# Patient Record
Sex: Female | Born: 1945 | Race: Black or African American | Hispanic: No | Marital: Single | Smoking: Never smoker
Health system: Southern US, Community
[De-identification: ages and names within clinical notes are randomized; demographics above are authoritative.]

## PROBLEM LIST (undated history)

## (undated) DIAGNOSIS — M1990 Unspecified osteoarthritis, unspecified site: Secondary | ICD-10-CM

## (undated) DIAGNOSIS — Z87442 Personal history of urinary calculi: Secondary | ICD-10-CM

## (undated) DIAGNOSIS — K5792 Diverticulitis of intestine, part unspecified, without perforation or abscess without bleeding: Secondary | ICD-10-CM

## (undated) DIAGNOSIS — R7303 Prediabetes: Secondary | ICD-10-CM

## (undated) DIAGNOSIS — E785 Hyperlipidemia, unspecified: Secondary | ICD-10-CM

## (undated) DIAGNOSIS — D699 Hemorrhagic condition, unspecified: Secondary | ICD-10-CM

## (undated) DIAGNOSIS — Z8719 Personal history of other diseases of the digestive system: Secondary | ICD-10-CM

## (undated) DIAGNOSIS — K219 Gastro-esophageal reflux disease without esophagitis: Secondary | ICD-10-CM

## (undated) DIAGNOSIS — I700 Atherosclerosis of aorta: Secondary | ICD-10-CM

## (undated) DIAGNOSIS — Z8711 Personal history of peptic ulcer disease: Secondary | ICD-10-CM

## (undated) DIAGNOSIS — I10 Essential (primary) hypertension: Secondary | ICD-10-CM

## (undated) DIAGNOSIS — M4316 Spondylolisthesis, lumbar region: Secondary | ICD-10-CM

## (undated) DIAGNOSIS — M810 Age-related osteoporosis without current pathological fracture: Secondary | ICD-10-CM

## (undated) HISTORY — PX: FOOT SURGERY: SHX648

## (undated) HISTORY — DX: Essential (primary) hypertension: I10

## (undated) HISTORY — DX: Personal history of other diseases of the digestive system: Z87.19

## (undated) HISTORY — DX: Unspecified osteoarthritis, unspecified site: M19.90

## (undated) HISTORY — DX: Hemorrhagic condition, unspecified: D69.9

## (undated) HISTORY — DX: Gastro-esophageal reflux disease without esophagitis: K21.9

## (undated) HISTORY — DX: Spondylolisthesis, lumbar region: M43.16

## (undated) HISTORY — DX: Personal history of peptic ulcer disease: Z87.11

## (undated) HISTORY — DX: Prediabetes: R73.03

## (undated) HISTORY — DX: Atherosclerosis of aorta: I70.0

## (undated) HISTORY — PX: ABDOMINAL HYSTERECTOMY: SHX81

## (undated) HISTORY — DX: Age-related osteoporosis without current pathological fracture: M81.0

## (undated) HISTORY — PX: BREAST CYST ASPIRATION: SHX578

## (undated) HISTORY — DX: Hyperlipidemia, unspecified: E78.5

---

## 2005-02-12 ENCOUNTER — Ambulatory Visit: Payer: Self-pay | Admitting: Family Medicine

## 2006-03-10 ENCOUNTER — Ambulatory Visit: Payer: Self-pay | Admitting: Family Medicine

## 2007-03-12 ENCOUNTER — Ambulatory Visit: Payer: Self-pay

## 2007-03-19 ENCOUNTER — Ambulatory Visit: Payer: Self-pay | Admitting: Family Medicine

## 2008-03-22 ENCOUNTER — Ambulatory Visit: Payer: Self-pay | Admitting: Family Medicine

## 2008-05-11 ENCOUNTER — Inpatient Hospital Stay: Payer: Self-pay | Admitting: Internal Medicine

## 2008-05-14 ENCOUNTER — Other Ambulatory Visit: Payer: Self-pay

## 2008-09-30 ENCOUNTER — Ambulatory Visit: Payer: Self-pay | Admitting: Unknown Physician Specialty

## 2008-11-25 HISTORY — PX: BACK SURGERY: SHX140

## 2009-03-28 ENCOUNTER — Ambulatory Visit: Payer: Self-pay | Admitting: Family Medicine

## 2009-08-18 ENCOUNTER — Ambulatory Visit: Payer: Self-pay | Admitting: Family Medicine

## 2009-09-06 ENCOUNTER — Ambulatory Visit: Payer: Self-pay | Admitting: Family Medicine

## 2009-09-29 ENCOUNTER — Ambulatory Visit (HOSPITAL_COMMUNITY): Admission: RE | Admit: 2009-09-29 | Discharge: 2009-09-30 | Payer: Self-pay | Admitting: Neurosurgery

## 2010-03-29 ENCOUNTER — Ambulatory Visit: Payer: Self-pay | Admitting: Family Medicine

## 2010-09-24 ENCOUNTER — Ambulatory Visit: Payer: Self-pay | Admitting: Family Medicine

## 2011-01-30 ENCOUNTER — Ambulatory Visit: Payer: Self-pay | Admitting: Physical Medicine and Rehabilitation

## 2011-02-27 LAB — URINALYSIS, ROUTINE W REFLEX MICROSCOPIC
Bilirubin Urine: NEGATIVE
Glucose, UA: NEGATIVE mg/dL
Hgb urine dipstick: NEGATIVE
Ketones, ur: NEGATIVE mg/dL
Nitrite: NEGATIVE
Protein, ur: NEGATIVE mg/dL
Specific Gravity, Urine: 1.006 (ref 1.005–1.030)
Urobilinogen, UA: 0.2 mg/dL (ref 0.0–1.0)
pH: 7.5 (ref 5.0–8.0)

## 2011-02-27 LAB — BASIC METABOLIC PANEL
BUN: 10 mg/dL (ref 6–23)
CO2: 26 mEq/L (ref 19–32)
Calcium: 8.8 mg/dL (ref 8.4–10.5)
Chloride: 105 mEq/L (ref 96–112)
Creatinine, Ser: 0.63 mg/dL (ref 0.4–1.2)
GFR calc Af Amer: >60 mL/min (ref >60)
GFR calc non Af Amer: >60 mL/min (ref >60)
Glucose, Bld: 93 mg/dL (ref 70–99)
Potassium: 4 mEq/L (ref 3.5–5.1)
Sodium: 140 mEq/L (ref 135–145)

## 2011-02-27 LAB — CBC
HCT: 39.3 % (ref 36.0–46.0)
Hemoglobin: 13.4 g/dL (ref 12.0–15.0)
MCHC: 34 g/dL (ref 30.0–36.0)
MCV: 90.8 fL (ref 78.0–100.0)
Platelets: 230 10*3/uL (ref 150–400)
RBC: 4.32 MIL/uL (ref 3.87–5.11)
RDW: 13.3 % (ref 11.5–15.5)
WBC: 5.6 10*3/uL (ref 4.0–10.5)

## 2011-04-02 ENCOUNTER — Ambulatory Visit: Payer: Self-pay | Admitting: Family Medicine

## 2011-07-22 ENCOUNTER — Ambulatory Visit: Payer: Self-pay | Admitting: Unknown Physician Specialty

## 2011-11-15 ENCOUNTER — Ambulatory Visit: Payer: Self-pay | Admitting: Unknown Physician Specialty

## 2011-11-18 LAB — PATHOLOGY REPORT

## 2012-01-01 ENCOUNTER — Ambulatory Visit: Payer: Self-pay | Admitting: Unknown Physician Specialty

## 2012-04-02 ENCOUNTER — Ambulatory Visit: Payer: Self-pay | Admitting: Family Medicine

## 2012-10-19 DIAGNOSIS — E7800 Pure hypercholesterolemia, unspecified: Secondary | ICD-10-CM | POA: Diagnosis not present

## 2012-10-19 DIAGNOSIS — I10 Essential (primary) hypertension: Secondary | ICD-10-CM | POA: Diagnosis not present

## 2013-02-18 DIAGNOSIS — M25569 Pain in unspecified knee: Secondary | ICD-10-CM | POA: Diagnosis not present

## 2013-02-18 DIAGNOSIS — I10 Essential (primary) hypertension: Secondary | ICD-10-CM | POA: Diagnosis not present

## 2013-02-18 DIAGNOSIS — E7800 Pure hypercholesterolemia, unspecified: Secondary | ICD-10-CM | POA: Diagnosis not present

## 2013-03-03 ENCOUNTER — Ambulatory Visit: Payer: Self-pay | Admitting: Family Medicine

## 2013-03-03 DIAGNOSIS — M810 Age-related osteoporosis without current pathological fracture: Secondary | ICD-10-CM | POA: Diagnosis not present

## 2013-03-03 DIAGNOSIS — E2839 Other primary ovarian failure: Secondary | ICD-10-CM | POA: Diagnosis not present

## 2013-05-20 DIAGNOSIS — M25579 Pain in unspecified ankle and joints of unspecified foot: Secondary | ICD-10-CM | POA: Diagnosis not present

## 2013-06-02 DIAGNOSIS — M2010 Hallux valgus (acquired), unspecified foot: Secondary | ICD-10-CM | POA: Diagnosis not present

## 2013-06-02 DIAGNOSIS — M109 Gout, unspecified: Secondary | ICD-10-CM | POA: Diagnosis not present

## 2013-06-02 DIAGNOSIS — M25579 Pain in unspecified ankle and joints of unspecified foot: Secondary | ICD-10-CM | POA: Diagnosis not present

## 2013-06-16 DIAGNOSIS — M25579 Pain in unspecified ankle and joints of unspecified foot: Secondary | ICD-10-CM | POA: Diagnosis not present

## 2013-06-24 DIAGNOSIS — I10 Essential (primary) hypertension: Secondary | ICD-10-CM | POA: Diagnosis not present

## 2013-06-24 DIAGNOSIS — Z1239 Encounter for other screening for malignant neoplasm of breast: Secondary | ICD-10-CM | POA: Diagnosis not present

## 2013-06-24 DIAGNOSIS — E7800 Pure hypercholesterolemia, unspecified: Secondary | ICD-10-CM | POA: Diagnosis not present

## 2013-06-24 DIAGNOSIS — Z1159 Encounter for screening for other viral diseases: Secondary | ICD-10-CM | POA: Diagnosis not present

## 2013-08-26 DIAGNOSIS — Z1331 Encounter for screening for depression: Secondary | ICD-10-CM | POA: Diagnosis not present

## 2013-08-26 DIAGNOSIS — Z0000 Encounter for general adult medical examination without abnormal findings: Secondary | ICD-10-CM | POA: Diagnosis not present

## 2013-08-26 DIAGNOSIS — Z1211 Encounter for screening for malignant neoplasm of colon: Secondary | ICD-10-CM | POA: Diagnosis not present

## 2013-08-26 DIAGNOSIS — Z9181 History of falling: Secondary | ICD-10-CM | POA: Diagnosis not present

## 2013-10-25 DIAGNOSIS — E7800 Pure hypercholesterolemia, unspecified: Secondary | ICD-10-CM | POA: Diagnosis not present

## 2013-10-25 DIAGNOSIS — I10 Essential (primary) hypertension: Secondary | ICD-10-CM | POA: Diagnosis not present

## 2013-11-03 ENCOUNTER — Ambulatory Visit: Payer: Self-pay | Admitting: Family Medicine

## 2013-11-03 DIAGNOSIS — Z1231 Encounter for screening mammogram for malignant neoplasm of breast: Secondary | ICD-10-CM | POA: Diagnosis not present

## 2013-11-25 DIAGNOSIS — K5792 Diverticulitis of intestine, part unspecified, without perforation or abscess without bleeding: Secondary | ICD-10-CM

## 2013-11-25 HISTORY — DX: Diverticulitis of intestine, part unspecified, without perforation or abscess without bleeding: K57.92

## 2014-01-11 ENCOUNTER — Inpatient Hospital Stay: Payer: Self-pay | Admitting: Student

## 2014-01-11 DIAGNOSIS — K5730 Diverticulosis of large intestine without perforation or abscess without bleeding: Secondary | ICD-10-CM | POA: Diagnosis not present

## 2014-01-11 DIAGNOSIS — K625 Hemorrhage of anus and rectum: Secondary | ICD-10-CM | POA: Diagnosis not present

## 2014-01-11 DIAGNOSIS — E7800 Pure hypercholesterolemia, unspecified: Secondary | ICD-10-CM | POA: Diagnosis present

## 2014-01-11 DIAGNOSIS — D649 Anemia, unspecified: Secondary | ICD-10-CM | POA: Diagnosis not present

## 2014-01-11 DIAGNOSIS — K5731 Diverticulosis of large intestine without perforation or abscess with bleeding: Secondary | ICD-10-CM | POA: Diagnosis not present

## 2014-01-11 DIAGNOSIS — M810 Age-related osteoporosis without current pathological fracture: Secondary | ICD-10-CM | POA: Diagnosis present

## 2014-01-11 DIAGNOSIS — R000 Tachycardia, unspecified: Secondary | ICD-10-CM | POA: Diagnosis present

## 2014-01-11 DIAGNOSIS — E785 Hyperlipidemia, unspecified: Secondary | ICD-10-CM | POA: Diagnosis not present

## 2014-01-11 DIAGNOSIS — E876 Hypokalemia: Secondary | ICD-10-CM | POA: Diagnosis not present

## 2014-01-11 DIAGNOSIS — D62 Acute posthemorrhagic anemia: Secondary | ICD-10-CM | POA: Diagnosis not present

## 2014-01-11 DIAGNOSIS — M7730 Calcaneal spur, unspecified foot: Secondary | ICD-10-CM | POA: Diagnosis not present

## 2014-01-11 DIAGNOSIS — M25579 Pain in unspecified ankle and joints of unspecified foot: Secondary | ICD-10-CM | POA: Diagnosis present

## 2014-01-11 DIAGNOSIS — R578 Other shock: Secondary | ICD-10-CM | POA: Diagnosis not present

## 2014-01-11 DIAGNOSIS — K219 Gastro-esophageal reflux disease without esophagitis: Secondary | ICD-10-CM | POA: Diagnosis present

## 2014-01-11 DIAGNOSIS — M79609 Pain in unspecified limb: Secondary | ICD-10-CM | POA: Diagnosis not present

## 2014-01-11 DIAGNOSIS — I10 Essential (primary) hypertension: Secondary | ICD-10-CM | POA: Diagnosis not present

## 2014-01-11 DIAGNOSIS — K921 Melena: Secondary | ICD-10-CM | POA: Diagnosis not present

## 2014-01-11 DIAGNOSIS — K922 Gastrointestinal hemorrhage, unspecified: Secondary | ICD-10-CM | POA: Diagnosis not present

## 2014-01-11 LAB — COMPREHENSIVE METABOLIC PANEL
Albumin: 3 g/dL — ABNORMAL LOW (ref 3.4–5.0)
Alkaline Phosphatase: 69 U/L
Anion Gap: 3 — ABNORMAL LOW (ref 7–16)
BUN: 19 mg/dL — ABNORMAL HIGH (ref 7–18)
Bilirubin,Total: 0.4 mg/dL (ref 0.2–1.0)
Calcium, Total: 8.5 mg/dL (ref 8.5–10.1)
Chloride: 108 mmol/L — ABNORMAL HIGH (ref 98–107)
Co2: 31 mmol/L (ref 21–32)
Creatinine: 1.04 mg/dL (ref 0.60–1.30)
EGFR (African American): >60
EGFR (Non-African Amer.): 56 — ABNORMAL LOW
Glucose: 147 mg/dL — ABNORMAL HIGH (ref 65–99)
Osmolality: 288 (ref 275–301)
Potassium: 3.4 mmol/L — ABNORMAL LOW (ref 3.5–5.1)
SGOT(AST): 24 U/L (ref 15–37)
SGPT (ALT): 16 U/L (ref 12–78)
Sodium: 142 mmol/L (ref 136–145)
Total Protein: 6.2 g/dL — ABNORMAL LOW (ref 6.4–8.2)

## 2014-01-11 LAB — URINALYSIS, COMPLETE
Bacteria: NONE SEEN
Bilirubin,UR: NEGATIVE
Blood: NEGATIVE
Glucose,UR: 150 mg/dL (ref 0–75)
Ketone: NEGATIVE
Leukocyte Esterase: NEGATIVE
Nitrite: NEGATIVE
Ph: 6 (ref 4.5–8.0)
Protein: NEGATIVE
RBC,UR: 1 /HPF (ref 0–5)
Specific Gravity: 1.02 (ref 1.003–1.030)
Squamous Epithelial: 1
WBC UR: <1 /HPF (ref 0–5)

## 2014-01-11 LAB — LIPASE, BLOOD: Lipase: 199 U/L (ref 73–393)

## 2014-01-11 LAB — PROTIME-INR
INR: 1
Prothrombin Time: 13 secs (ref 11.5–14.7)

## 2014-01-11 LAB — CBC
HCT: 31.4 % — ABNORMAL LOW (ref 35.0–47.0)
HGB: 10.3 g/dL — ABNORMAL LOW (ref 12.0–16.0)
MCH: 29.8 pg (ref 26.0–34.0)
MCHC: 32.7 g/dL (ref 32.0–36.0)
MCV: 91 fL (ref 80–100)
Platelet: 217 10*3/uL (ref 150–440)
RBC: 3.45 10*6/uL — ABNORMAL LOW (ref 3.80–5.20)
RDW: 13.9 % (ref 11.5–14.5)
WBC: 7.9 10*3/uL (ref 3.6–11.0)

## 2014-01-11 LAB — APTT: Activated PTT: 26.5 secs (ref 23.6–35.9)

## 2014-01-11 LAB — HEMOGLOBIN: HGB: 8.3 g/dL — ABNORMAL LOW (ref 12.0–16.0)

## 2014-01-12 LAB — BASIC METABOLIC PANEL
Anion Gap: 3 — ABNORMAL LOW (ref 7–16)
BUN: 12 mg/dL (ref 7–18)
Calcium, Total: 7.3 mg/dL — ABNORMAL LOW (ref 8.5–10.1)
Chloride: 113 mmol/L — ABNORMAL HIGH (ref 98–107)
Co2: 26 mmol/L (ref 21–32)
Creatinine: 0.74 mg/dL (ref 0.60–1.30)
EGFR (African American): >60
EGFR (Non-African Amer.): >60
Glucose: 89 mg/dL (ref 65–99)
Osmolality: 282 (ref 275–301)
Potassium: 4.1 mmol/L (ref 3.5–5.1)
Sodium: 142 mmol/L (ref 136–145)

## 2014-01-12 LAB — CBC WITH DIFFERENTIAL/PLATELET
Basophil #: 0 10*3/uL (ref 0.0–0.1)
Basophil %: 0.3 %
Eosinophil #: 0.1 10*3/uL (ref 0.0–0.7)
Eosinophil %: 0.8 %
HCT: 21.5 % — ABNORMAL LOW (ref 35.0–47.0)
HGB: 7.3 g/dL — ABNORMAL LOW (ref 12.0–16.0)
Lymphocyte #: 2.2 10*3/uL (ref 1.0–3.6)
Lymphocyte %: 31.4 %
MCH: 30.8 pg (ref 26.0–34.0)
MCHC: 34 g/dL (ref 32.0–36.0)
MCV: 91 fL (ref 80–100)
Monocyte #: 0.4 x10 3/mm (ref 0.2–0.9)
Monocyte %: 5.7 %
Neutrophil #: 4.4 10*3/uL (ref 1.4–6.5)
Neutrophil %: 61.8 %
Platelet: 158 10*3/uL (ref 150–440)
RBC: 2.37 10*6/uL — ABNORMAL LOW (ref 3.80–5.20)
RDW: 13.5 % (ref 11.5–14.5)
WBC: 7.1 10*3/uL (ref 3.6–11.0)

## 2014-01-12 LAB — HEMOGLOBIN: HGB: 7.3 g/dL — ABNORMAL LOW (ref 12.0–16.0)

## 2014-01-13 LAB — CBC WITH DIFFERENTIAL/PLATELET
Basophil #: 0 10*3/uL (ref 0.0–0.1)
Basophil %: 0.2 %
Eosinophil #: 0 10*3/uL (ref 0.0–0.7)
Eosinophil %: 0.3 %
HCT: 20 % — ABNORMAL LOW (ref 35.0–47.0)
HGB: 7.2 g/dL — ABNORMAL LOW (ref 12.0–16.0)
Lymphocyte #: 1.4 10*3/uL (ref 1.0–3.6)
Lymphocyte %: 13.6 %
MCH: 31.8 pg (ref 26.0–34.0)
MCHC: 36.3 g/dL — ABNORMAL HIGH (ref 32.0–36.0)
MCV: 88 fL (ref 80–100)
Monocyte #: 0.5 x10 3/mm (ref 0.2–0.9)
Monocyte %: 5.4 %
Neutrophil #: 8.1 10*3/uL — ABNORMAL HIGH (ref 1.4–6.5)
Neutrophil %: 80.5 %
Platelet: 123 10*3/uL — ABNORMAL LOW (ref 150–440)
RBC: 2.28 10*6/uL — ABNORMAL LOW (ref 3.80–5.20)
RDW: 14.6 % — ABNORMAL HIGH (ref 11.5–14.5)
WBC: 10 10*3/uL (ref 3.6–11.0)

## 2014-01-13 LAB — HEMOGLOBIN
HGB: 7.9 g/dL — ABNORMAL LOW (ref 12.0–16.0)
HGB: 8 g/dL — ABNORMAL LOW (ref 12.0–16.0)
HGB: 8.7 g/dL — ABNORMAL LOW (ref 12.0–16.0)

## 2014-01-13 LAB — BASIC METABOLIC PANEL
Anion Gap: 11 (ref 7–16)
BUN: 6 mg/dL — ABNORMAL LOW (ref 7–18)
Calcium, Total: 7.1 mg/dL — ABNORMAL LOW (ref 8.5–10.1)
Chloride: 111 mmol/L — ABNORMAL HIGH (ref 98–107)
Co2: 23 mmol/L (ref 21–32)
Creatinine: 0.71 mg/dL (ref 0.60–1.30)
EGFR (African American): >60
EGFR (Non-African Amer.): >60
Glucose: 98 mg/dL (ref 65–99)
Osmolality: 286 (ref 275–301)
Potassium: 3.6 mmol/L (ref 3.5–5.1)
Sodium: 145 mmol/L (ref 136–145)

## 2014-01-14 LAB — CBC WITH DIFFERENTIAL/PLATELET
Basophil #: 0 x10 3/mm 3 (ref 0.0–0.1)
Basophil %: 0.2 %
Eosinophil #: 0.2 x10 3/mm 3 (ref 0.0–0.7)
Eosinophil %: 2.2 %
HCT: 18.3 % — ABNORMAL LOW (ref 35.0–47.0)
HGB: 6.6 g/dL — ABNORMAL LOW (ref 12.0–16.0)
Lymphocyte %: 25.5 %
Lymphs Abs: 1.9 x10 3/mm 3 (ref 1.0–3.6)
MCH: 30.9 pg (ref 26.0–34.0)
MCHC: 35.7 g/dL (ref 32.0–36.0)
MCV: 87 fL (ref 80–100)
Monocyte #: 0.6 x10 3/mm (ref 0.2–0.9)
Monocyte %: 8.3 %
Neutrophil #: 4.9 x10 3/mm 3 (ref 1.4–6.5)
Neutrophil %: 63.8 %
Platelet: 106 x10 3/mm 3 — ABNORMAL LOW (ref 150–440)
RBC: 2.12 X10 6/mm 3 — ABNORMAL LOW (ref 3.80–5.20)
RDW: 14.4 % (ref 11.5–14.5)
WBC: 7.6 x10 3/mm 3 (ref 3.6–11.0)

## 2014-01-14 LAB — HEMOGLOBIN: HGB: 8.5 g/dL — ABNORMAL LOW (ref 12.0–16.0)

## 2014-01-15 LAB — CBC WITH DIFFERENTIAL/PLATELET
Basophil #: 0 10*3/uL (ref 0.0–0.1)
Basophil %: 0.3 %
Eosinophil #: 0.2 10*3/uL (ref 0.0–0.7)
Eosinophil %: 2.2 %
HCT: 22.2 % — ABNORMAL LOW (ref 35.0–47.0)
HGB: 7.7 g/dL — ABNORMAL LOW (ref 12.0–16.0)
Lymphocyte #: 1.8 10*3/uL (ref 1.0–3.6)
Lymphocyte %: 22.8 %
MCH: 30.5 pg (ref 26.0–34.0)
MCHC: 34.7 g/dL (ref 32.0–36.0)
MCV: 88 fL (ref 80–100)
Monocyte #: 0.6 x10 3/mm (ref 0.2–0.9)
Monocyte %: 8 %
Neutrophil #: 5.3 10*3/uL (ref 1.4–6.5)
Neutrophil %: 66.7 %
Platelet: 127 10*3/uL — ABNORMAL LOW (ref 150–440)
RBC: 2.53 10*6/uL — ABNORMAL LOW (ref 3.80–5.20)
RDW: 14.2 % (ref 11.5–14.5)
WBC: 7.9 10*3/uL (ref 3.6–11.0)

## 2014-01-15 LAB — BASIC METABOLIC PANEL
Anion Gap: 2 — ABNORMAL LOW (ref 7–16)
BUN: 3 mg/dL — ABNORMAL LOW (ref 7–18)
Calcium, Total: 6.9 mg/dL — CL (ref 8.5–10.1)
Chloride: 111 mmol/L — ABNORMAL HIGH (ref 98–107)
Co2: 29 mmol/L (ref 21–32)
Creatinine: 0.71 mg/dL (ref 0.60–1.30)
EGFR (African American): >60
EGFR (Non-African Amer.): >60
Glucose: 89 mg/dL (ref 65–99)
Osmolality: 279 (ref 275–301)
Potassium: 3.3 mmol/L — ABNORMAL LOW (ref 3.5–5.1)
Sodium: 142 mmol/L (ref 136–145)

## 2014-01-15 LAB — HEMOGLOBIN: HGB: 8.3 g/dL — ABNORMAL LOW (ref 12.0–16.0)

## 2014-01-15 LAB — MAGNESIUM: Magnesium: 1.5 mg/dL — ABNORMAL LOW

## 2014-01-16 LAB — BASIC METABOLIC PANEL
Anion Gap: 5 — ABNORMAL LOW (ref 7–16)
BUN: 2 mg/dL — ABNORMAL LOW (ref 7–18)
Calcium, Total: 7.6 mg/dL — ABNORMAL LOW (ref 8.5–10.1)
Chloride: 108 mmol/L — ABNORMAL HIGH (ref 98–107)
Co2: 28 mmol/L (ref 21–32)
Creatinine: 0.71 mg/dL (ref 0.60–1.30)
EGFR (African American): >60
EGFR (Non-African Amer.): >60
Glucose: 104 mg/dL — ABNORMAL HIGH (ref 65–99)
Osmolality: 278 (ref 275–301)
Potassium: 3.5 mmol/L (ref 3.5–5.1)
Sodium: 141 mmol/L (ref 136–145)

## 2014-01-16 LAB — CBC WITH DIFFERENTIAL/PLATELET
Basophil #: 0 10*3/uL (ref 0.0–0.1)
Basophil #: 0 10*3/uL (ref 0.0–0.1)
Basophil %: 0.4 %
Basophil %: 0.2 %
Eosinophil #: 0.1 10*3/uL (ref 0.0–0.7)
Eosinophil #: 0.1 10*3/uL (ref 0.0–0.7)
Eosinophil %: 2.2 %
Eosinophil %: 1.4 %
HCT: 22.2 % — ABNORMAL LOW (ref 35.0–47.0)
HCT: 22.4 % — ABNORMAL LOW (ref 35.0–47.0)
HGB: 7.6 g/dL — ABNORMAL LOW (ref 12.0–16.0)
HGB: 8 g/dL — ABNORMAL LOW (ref 12.0–16.0)
Lymphocyte #: 1.3 10*3/uL (ref 1.0–3.6)
Lymphocyte #: 1.2 10*3/uL (ref 1.0–3.6)
Lymphocyte %: 21.6 %
Lymphocyte %: 18.8 %
MCH: 30.5 pg (ref 26.0–34.0)
MCH: 31.9 pg (ref 26.0–34.0)
MCHC: 34.3 g/dL (ref 32.0–36.0)
MCHC: 35.9 g/dL (ref 32.0–36.0)
MCV: 89 fL (ref 80–100)
MCV: 89 fL (ref 80–100)
Monocyte #: 0.5 x10 3/mm (ref 0.2–0.9)
Monocyte #: 0.6 x10 3/mm (ref 0.2–0.9)
Monocyte %: 8.4 %
Monocyte %: 8.9 %
Neutrophil #: 4 10*3/uL (ref 1.4–6.5)
Neutrophil #: 4.5 10*3/uL (ref 1.4–6.5)
Neutrophil %: 67.4 %
Neutrophil %: 70.7 %
Platelet: 154 10*3/uL (ref 150–440)
Platelet: 157 10*3/uL (ref 150–440)
RBC: 2.5 10*6/uL — ABNORMAL LOW (ref 3.80–5.20)
RBC: 2.52 10*6/uL — ABNORMAL LOW (ref 3.80–5.20)
RDW: 14.1 % (ref 11.5–14.5)
RDW: 14.4 % (ref 11.5–14.5)
WBC: 5.9 10*3/uL (ref 3.6–11.0)
WBC: 6.3 10*3/uL (ref 3.6–11.0)

## 2014-01-17 LAB — CBC WITH DIFFERENTIAL/PLATELET
Basophil #: 0 10*3/uL (ref 0.0–0.1)
Basophil %: 0.3 %
Eosinophil #: 0.1 10*3/uL (ref 0.0–0.7)
Eosinophil %: 2.6 %
HCT: 21.9 % — ABNORMAL LOW (ref 35.0–47.0)
HGB: 7.4 g/dL — ABNORMAL LOW (ref 12.0–16.0)
Lymphocyte #: 1 10*3/uL (ref 1.0–3.6)
Lymphocyte %: 17.9 %
MCH: 30.4 pg (ref 26.0–34.0)
MCHC: 33.9 g/dL (ref 32.0–36.0)
MCV: 90 fL (ref 80–100)
Monocyte #: 0.5 x10 3/mm (ref 0.2–0.9)
Monocyte %: 8.8 %
Neutrophil #: 3.8 10*3/uL (ref 1.4–6.5)
Neutrophil %: 70.4 %
Platelet: 177 10*3/uL (ref 150–440)
RBC: 2.44 10*6/uL — ABNORMAL LOW (ref 3.80–5.20)
RDW: 14.2 % (ref 11.5–14.5)
WBC: 5.4 10*3/uL (ref 3.6–11.0)

## 2014-01-17 LAB — HEMOGLOBIN: HGB: 8.1 g/dL — ABNORMAL LOW (ref 12.0–16.0)

## 2014-01-19 DIAGNOSIS — K625 Hemorrhage of anus and rectum: Secondary | ICD-10-CM | POA: Diagnosis not present

## 2014-01-19 DIAGNOSIS — D500 Iron deficiency anemia secondary to blood loss (chronic): Secondary | ICD-10-CM | POA: Diagnosis not present

## 2014-01-19 DIAGNOSIS — R109 Unspecified abdominal pain: Secondary | ICD-10-CM | POA: Diagnosis not present

## 2014-02-04 ENCOUNTER — Ambulatory Visit: Payer: Self-pay | Admitting: Gastroenterology

## 2014-02-04 DIAGNOSIS — I10 Essential (primary) hypertension: Secondary | ICD-10-CM | POA: Diagnosis not present

## 2014-02-04 DIAGNOSIS — K5730 Diverticulosis of large intestine without perforation or abscess without bleeding: Secondary | ICD-10-CM | POA: Diagnosis not present

## 2014-02-04 DIAGNOSIS — R109 Unspecified abdominal pain: Secondary | ICD-10-CM | POA: Diagnosis not present

## 2014-02-04 DIAGNOSIS — Z87891 Personal history of nicotine dependence: Secondary | ICD-10-CM | POA: Diagnosis not present

## 2014-02-04 DIAGNOSIS — K625 Hemorrhage of anus and rectum: Secondary | ICD-10-CM | POA: Diagnosis not present

## 2014-02-04 DIAGNOSIS — D500 Iron deficiency anemia secondary to blood loss (chronic): Secondary | ICD-10-CM | POA: Diagnosis not present

## 2014-02-04 DIAGNOSIS — D126 Benign neoplasm of colon, unspecified: Secondary | ICD-10-CM | POA: Diagnosis not present

## 2014-02-04 DIAGNOSIS — K6389 Other specified diseases of intestine: Secondary | ICD-10-CM | POA: Diagnosis not present

## 2014-02-04 DIAGNOSIS — Z79899 Other long term (current) drug therapy: Secondary | ICD-10-CM | POA: Diagnosis not present

## 2014-02-04 DIAGNOSIS — R1084 Generalized abdominal pain: Secondary | ICD-10-CM | POA: Diagnosis not present

## 2014-02-07 LAB — PATHOLOGY REPORT

## 2014-03-01 DIAGNOSIS — E7800 Pure hypercholesterolemia, unspecified: Secondary | ICD-10-CM | POA: Diagnosis not present

## 2014-03-01 DIAGNOSIS — K5730 Diverticulosis of large intestine without perforation or abscess without bleeding: Secondary | ICD-10-CM | POA: Diagnosis not present

## 2014-03-01 DIAGNOSIS — D649 Anemia, unspecified: Secondary | ICD-10-CM | POA: Diagnosis not present

## 2014-03-01 DIAGNOSIS — I10 Essential (primary) hypertension: Secondary | ICD-10-CM | POA: Diagnosis not present

## 2014-03-01 DIAGNOSIS — K922 Gastrointestinal hemorrhage, unspecified: Secondary | ICD-10-CM | POA: Diagnosis not present

## 2014-07-04 DIAGNOSIS — I10 Essential (primary) hypertension: Secondary | ICD-10-CM | POA: Diagnosis not present

## 2014-07-04 DIAGNOSIS — E7800 Pure hypercholesterolemia, unspecified: Secondary | ICD-10-CM | POA: Diagnosis not present

## 2014-07-04 DIAGNOSIS — L508 Other urticaria: Secondary | ICD-10-CM | POA: Diagnosis not present

## 2014-11-03 DIAGNOSIS — E780 Pure hypercholesterolemia: Secondary | ICD-10-CM | POA: Diagnosis not present

## 2014-11-03 DIAGNOSIS — I10 Essential (primary) hypertension: Secondary | ICD-10-CM | POA: Diagnosis not present

## 2014-11-03 DIAGNOSIS — G4700 Insomnia, unspecified: Secondary | ICD-10-CM | POA: Diagnosis not present

## 2015-01-03 ENCOUNTER — Ambulatory Visit: Payer: Self-pay | Admitting: Family Medicine

## 2015-01-03 DIAGNOSIS — Z1231 Encounter for screening mammogram for malignant neoplasm of breast: Secondary | ICD-10-CM | POA: Diagnosis not present

## 2015-01-24 DIAGNOSIS — Z1389 Encounter for screening for other disorder: Secondary | ICD-10-CM | POA: Diagnosis not present

## 2015-01-24 DIAGNOSIS — K219 Gastro-esophageal reflux disease without esophagitis: Secondary | ICD-10-CM | POA: Diagnosis not present

## 2015-01-24 DIAGNOSIS — M1990 Unspecified osteoarthritis, unspecified site: Secondary | ICD-10-CM | POA: Diagnosis not present

## 2015-01-24 DIAGNOSIS — Z1239 Encounter for other screening for malignant neoplasm of breast: Secondary | ICD-10-CM | POA: Diagnosis not present

## 2015-01-24 DIAGNOSIS — Z0000 Encounter for general adult medical examination without abnormal findings: Secondary | ICD-10-CM | POA: Diagnosis not present

## 2015-01-24 DIAGNOSIS — I10 Essential (primary) hypertension: Secondary | ICD-10-CM | POA: Diagnosis not present

## 2015-01-24 DIAGNOSIS — Z1211 Encounter for screening for malignant neoplasm of colon: Secondary | ICD-10-CM | POA: Diagnosis not present

## 2015-01-24 DIAGNOSIS — M810 Age-related osteoporosis without current pathological fracture: Secondary | ICD-10-CM | POA: Diagnosis not present

## 2015-01-24 DIAGNOSIS — Z9181 History of falling: Secondary | ICD-10-CM | POA: Diagnosis not present

## 2015-01-29 IMAGING — US US EXTREM LOW VENOUS*L*
1 series · 14 of 24 positions shown · non-contrast
Comparison: NEXT

CLINICAL DATA: Left leg pain

EXAM:
LEFT LOWER EXTREMITY VENOUS DOPPLER ULTRASOUND
TECHNIQUE: Gray-scale sonography with graded compression, as well as color
Doppler and duplex ultrasound, were performed to evaluate the deep
venous system from the level of the common femoral vein through the
popliteal and proximal calf veins. Spectral Doppler was utilized to
evaluate flow at rest and with distal augmentation maneuvers.

[Series 1: us extrem low venous*left* · 0.08mm/px · 14 of 33 slices shown]
[im 1/33]
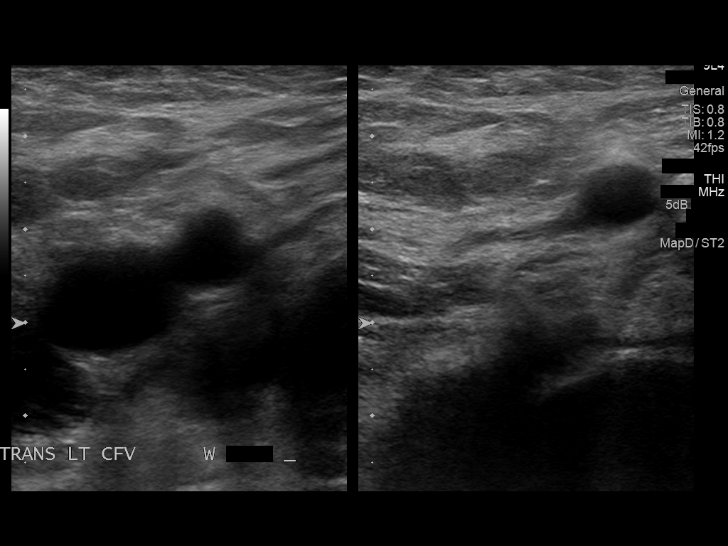
[im 3/33]
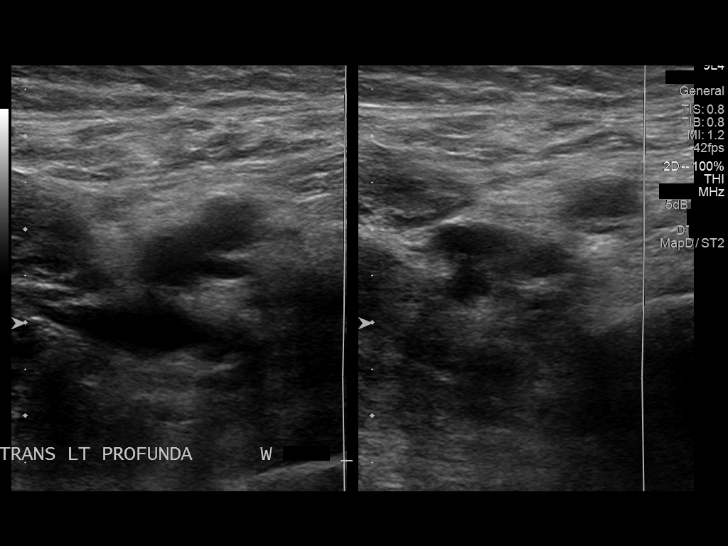
[im 6/33]
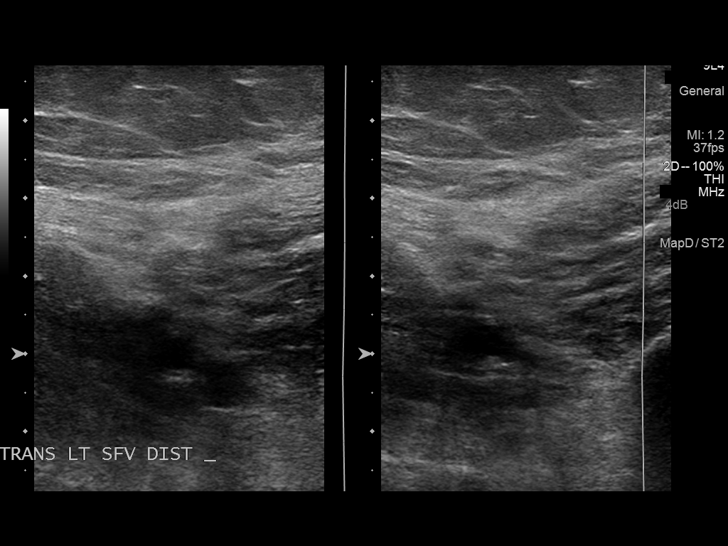
[im 9/33]
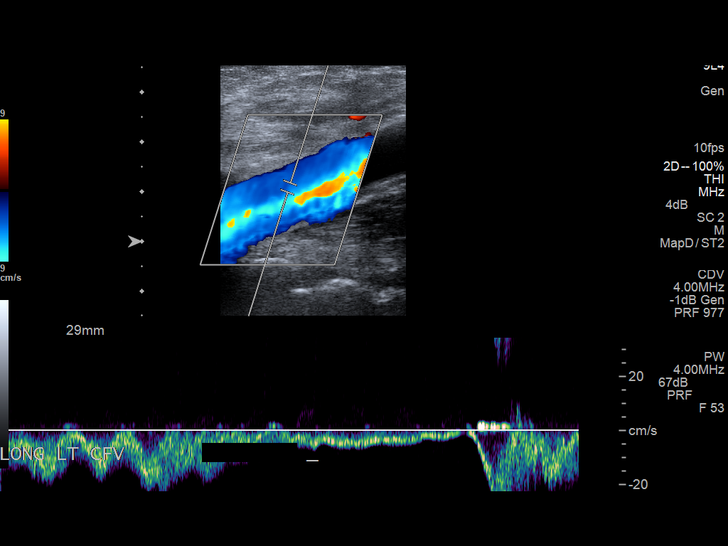
[im 10/33]
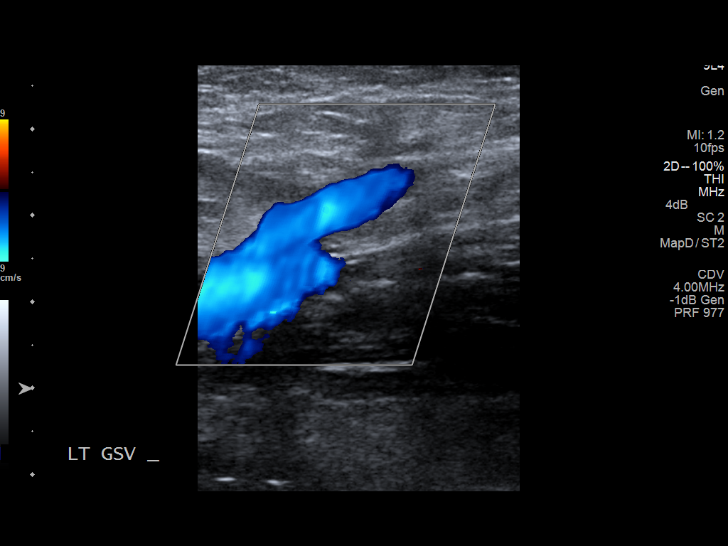
[im 13/33]
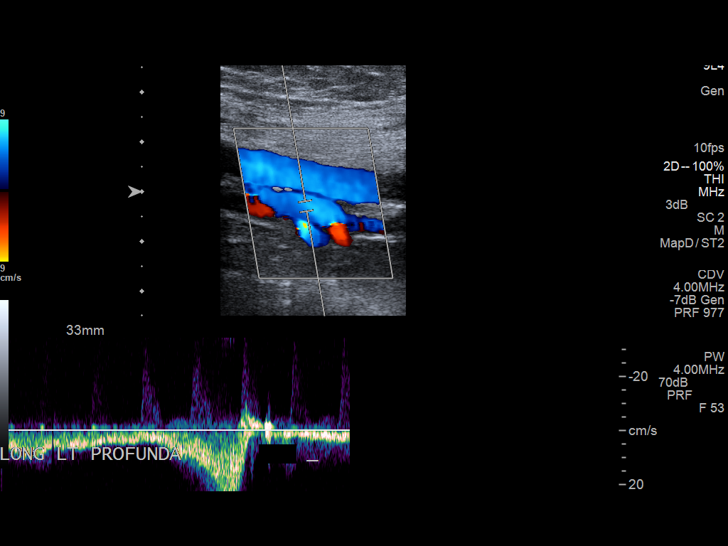
[im 16/33]
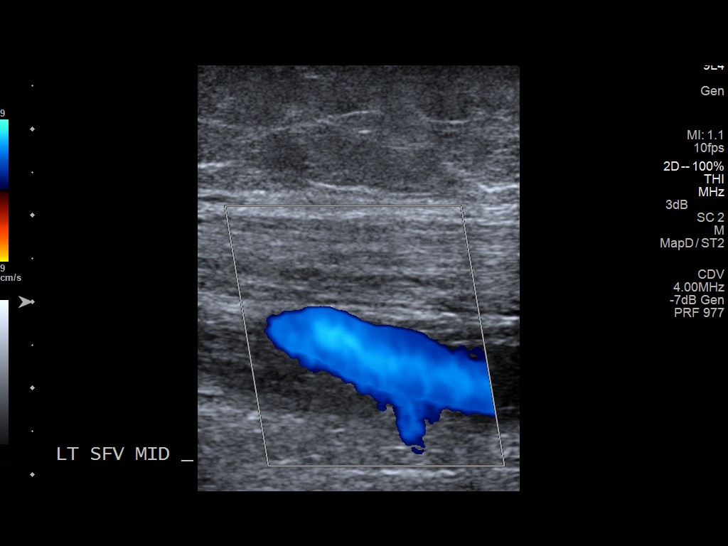
[im 17/33]
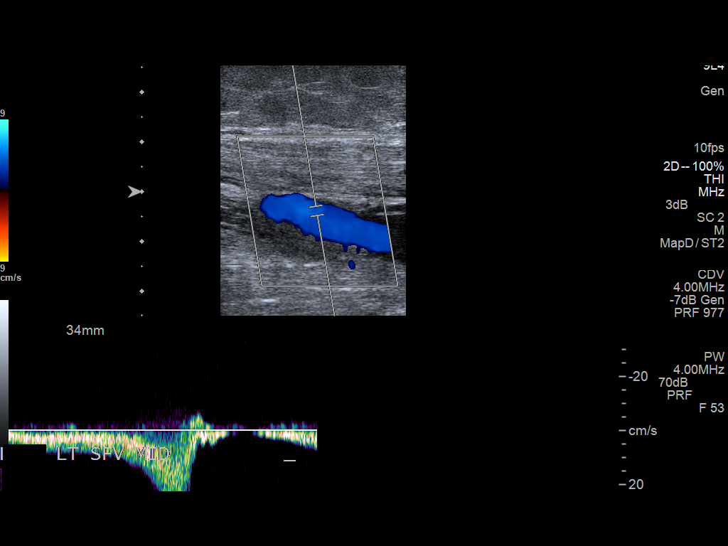
[im 20/33]
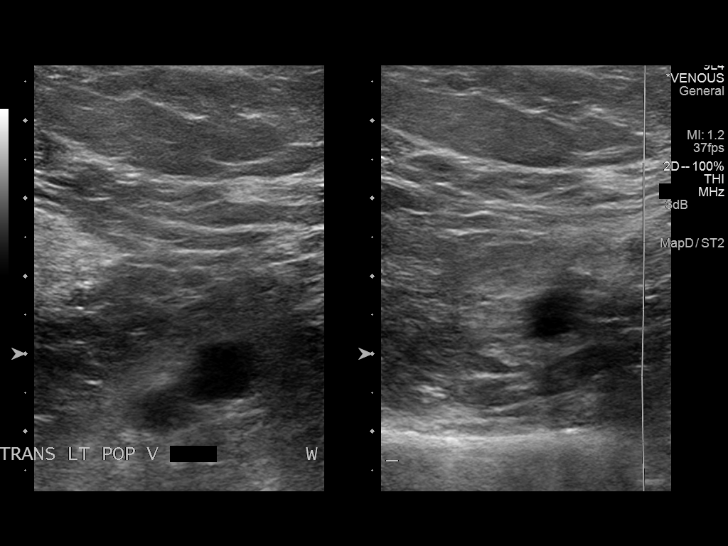
[im 23/33]
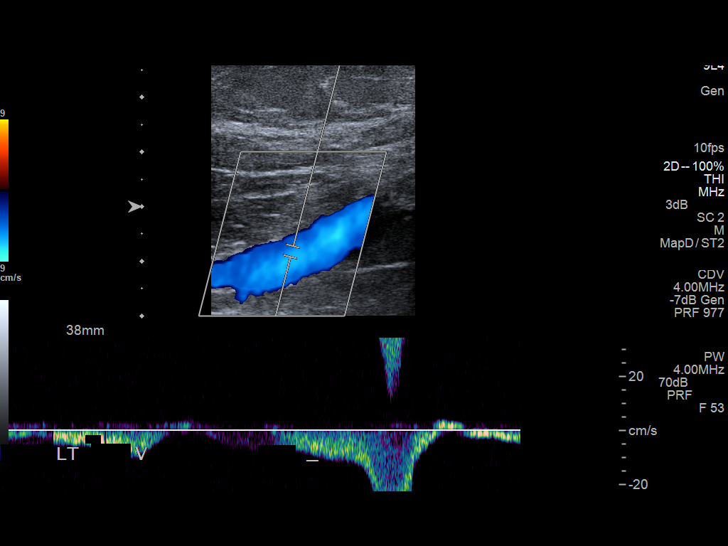
[im 26/33]
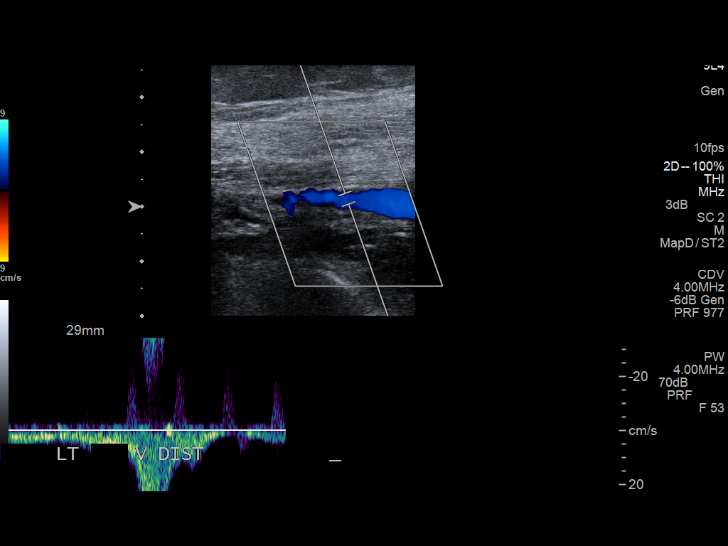
[im 27/33]
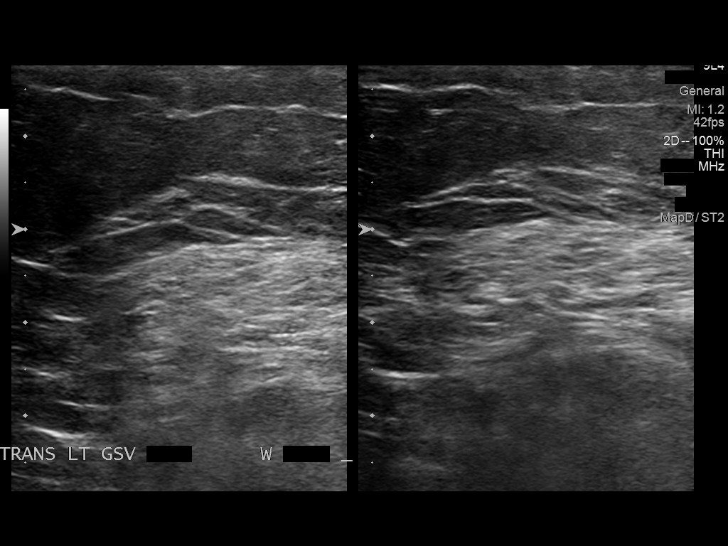
[im 30/33]
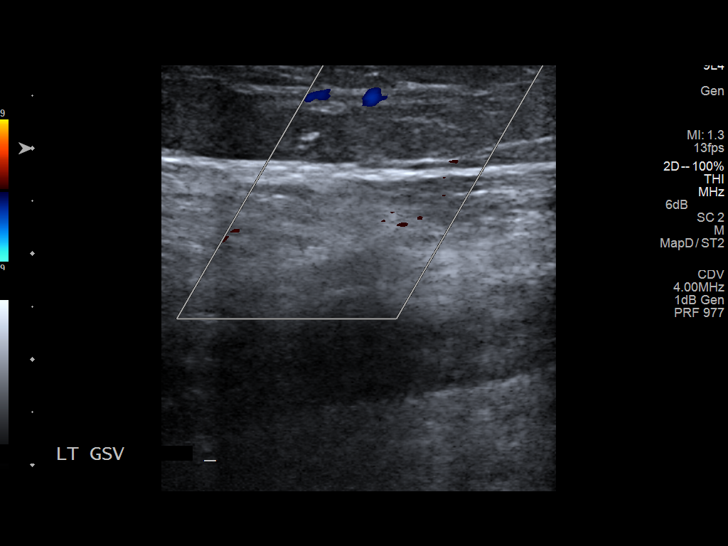
[im 33/33]
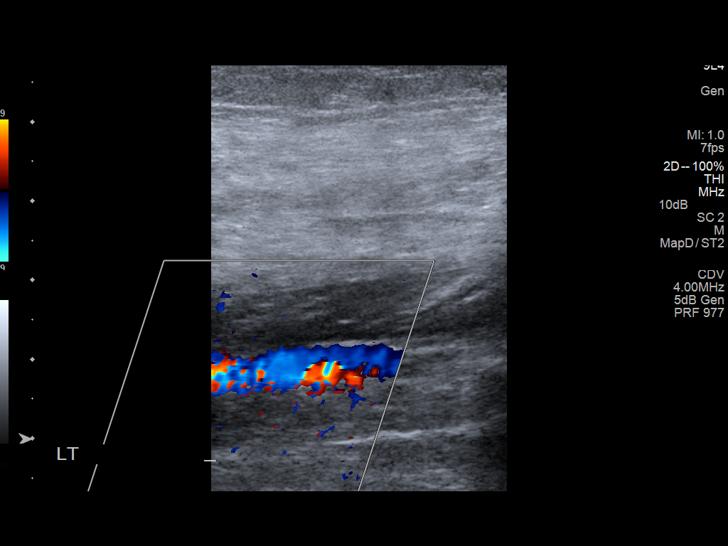

[14 of 24 positions shown; findings below may reference images not displayed]

FINDINGS: Thrombus within deep veins:  None visualized.

Compressibility of deep veins:  Normal.

Duplex waveform respiratory phasicity:  Normal.

Duplex waveform response to augmentation:  Normal.

Venous reflux:  None visualized.

Other findings:  None visualized.
IMPRESSION: Negative for left lower extremity DVT.

## 2015-03-18 NOTE — Consult Note (Signed)
CHIEF COMPLAINT and HISTORY:  Subjective/Chief Complaint GI bleed   History of Present Illness Holly Green 69 yo AAF with history of GI bleeding, s/p embolization in 2009.  Presents with painless lower GI bleed for last 24 hours.  She has low BP and has been transferred to the CCU.  Bleeding scan positive for right colonic bleeding.   PAST MEDICAL/SURGICAL HISTORY:  Past Medical History:   Hypercholesterolemia:    GI Bleed:    Hypertension:    Right Foot Surgery:    Tonsillectomy:    Hysterectomy - Total:   ALLERGIES:  Allergies:  No Known Allergies:   Blood Thinners cause GI Bleeds: Do NOT Use  HOME MEDICATIONS:  Home Medications: Medication Instructions Status  lovastatin 20 mg oral tablet 1 tab(s) orally once a day (in the evening) Active  Cardizem tablet 120 mg 1 tab(s) orally 2 times a day Active  Ziac 10 mg-6.25 mg oral tablet 1 tab(s) orally once a day Active   Family and Social History:  Family History Non-Contributory   Social History negative tobacco, negative ETOH   Place of Living Home   Review of Systems:  Fever/Chills No   Cough No   Sputum No   Abdominal Pain No   Diarrhea Yes   Constipation No   Nausea/Vomiting No   SOB/DOE No   Chest Pain No   Telemetry Reviewed NSR   Dysuria No   Tolerating PT Yes   Tolerating Diet Yes   Medications/Allergies Reviewed Medications/Allergies reviewed   Physical Exam:  GEN well developed, well nourished   HEENT hearing intact to voice, moist oral mucosa   NECK No masses  trachea midline   RESP normal resp effort  no use of accessory muscles   CARD regular rate  no JVD   VASCULAR ACCESS none   ABD denies tenderness  normal BS   GU no superpubic tenderness   LYMPH negative neck, negative axillae   EXTR negative cyanosis/clubbing, negative edema   SKIN normal to palpation, skin turgor good   NEURO cranial nerves intact, motor/sensory function intact   PSYCH alert, A+O to time,  place, person   LABS:  Laboratory Results: Hepatic:    17-Feb-15 11:21, Comprehensive Metabolic Panel  Bilirubin, Total 0.4  Alkaline Phosphatase 69  45-117  NOTE: New Reference Range  10/15/13  SGPT (ALT) 16  SGOT (AST) 24  Total Protein, Serum 6.2  Albumin, Serum 3.0  Routine BB:    17-Feb-15 11:21, Crossmatch 1 Unit  Crossmatch Unit 1 Issued  Result(s) reported on 12 Jan 2014 at 01:29PM.    17-Feb-15 11:21, Type and Antibody Screen  ABO Group + Rh Type   B Positive  Antibody Screen NEGATIVE  Result(s) reported on 11 Jan 2014 at 12:27PM.  Routine Chem:    17-Feb-15 11:21, Comprehensive Metabolic Panel  Glucose, Serum 147  BUN 19  Creatinine (comp) 1.04  Sodium, Serum 142  Potassium, Serum 3.4  Chloride, Serum 108  CO2, Serum 31  Calcium (Total), Serum 8.5  Osmolality (calc) 288  eGFR (African American) >60  eGFR (Non-African American) 56  eGFR values <11m/min/1.73 m2 may be an indication of chronic  kidney disease (CKD).  Calculated eGFR is useful in patients with stable renal function.  The eGFR calculation will not be reliable in acutely ill patients  when serum creatinine is changing rapidly. It is not useful in   patients on dialysis. The eGFR calculation may not be applicable  to patients at the low and high  extremes of body sizes, pregnant  women, and vegetarians.  Anion Gap 3    17-Feb-15 11:21, Lipase  Lipase 199  Result(s) reported on 11 Jan 2014 at 11:46AM.    18-Feb-15 10:25, Basic Metabolic Panel (w/Total Calcium)  Glucose, Serum 89  BUN 12  Creatinine (comp) 0.74  Sodium, Serum 142  Potassium, Serum 4.1  Chloride, Serum 113  CO2, Serum 26  Calcium (Total), Serum 7.3  Anion Gap 3  Osmolality (calc) 282  eGFR (African American) >60  eGFR (Non-African American) >60  eGFR values <68m/min/1.73 m2 may be an indication of chronic  kidney disease (CKD).  Calculated eGFR is useful in patients with stable renal function.  The eGFR calculation  will not be reliable in acutely ill patients  when serum creatinine is changing rapidly. It is not useful in   patients on dialysis. The eGFR calculation may not be applicable  to patients at the low and high extremes of body sizes, pregnant  women, and vegetarians.  Routine UA:    17-Feb-15 11:39, Urinalysis  Color (UA) Yellow  Clarity (UA) Hazy  Glucose (UA)   150 mg/dL  Bilirubin (UA) Negative  Ketones (UA) Negative  Specific Gravity (UA) 1.020  Blood (UA) Negative  pH (UA) 6.0  Protein (UA) Negative  Nitrite (UA) Negative  Leukocyte Esterase (UA) Negative  Result(s) reported on 11 Jan 2014 at 12:02PM.  RBC (UA) 1 /HPF  WBC (UA) <1 /HPF  Bacteria (UA)   NONE SEEN  Epithelial Cells (UA) 1 /HPF  Mucous (UA) PRESENT  Result(s) reported on 11 Jan 2014 at 12:02PM.  Routine Coag:    17-Feb-15 11:21, Activated PTT  Activated PTT (APTT) 26.5  A HCT value >55% may artifactually increase the APTT. In one study,  the increase was an average of 19%.  Reference: "Effect on Routine and Special Coagulation Testing Values  of Citrate Anticoagulant Adjustment in Patients with High HCT Values."  American Journal of Clinical Pathology 2006;126:400-405.    17-Feb-15 11:21, Prothrombin Time  Prothrombin 13.0  INR 1.0  INR reference interval applies to patients on anticoagulant therapy.  A single INR therapeutic range for coumarins is not optimal for all  indications; however, the suggested range for most indications is  2.0 - 3.0.  Exceptions to the INR Reference Range may include: Prosthetic heart  valves, acute myocardial infarction, prevention of myocardial  infarction, and combinations of aspirin and anticoagulant. The need  for a higher or lower target INR must be assessed individually.  Reference: The Pharmacology and Management of the Vitamin K   antagonists: the seventh ACCP Conference on Antithrombotic and  Thrombolytic Therapy. CENIDP.8242Sept:126 (3suppl): [id number]  A HCT  value >55% may artifactually increase the PT.  In one study,   the increase was an average of 25%.  Reference:  "Effect on Routine and Special Coagulation Testing Values  of Citrate Anticoagulant Adjustment in Patients with High HCT Values."  American Journal of Clinical Pathology 2006;126:400-405.  Routine Hem:    17-Feb-15 11:21, Hemogram, Platelet Count  WBC (CBC) 7.9  RBC (CBC) 3.45  Hemoglobin (CBC) 10.3  Hematocrit (CBC) 31.4  Platelet Count (CBC) 217  Result(s) reported on 11 Jan 2014 at 11:41AM.  MCV 91  MCH 29.8  MCHC 32.7  RDW 13.9    17-Feb-15 19:28, Hemoglobin  Hemoglobin (CBC) 8.3  Result(s) reported on 11 Jan 2014 at 07:42PM.    18-Feb-15 03:55, CBC Profile  WBC (CBC) 7.1  RBC (CBC) 2.37  Hemoglobin (CBC) 7.3  Hematocrit (CBC) 21.5  Platelet Count (CBC) 158  MCV 91  MCH 30.8  MCHC 34.0  RDW 13.5  Neutrophil % 61.8  Lymphocyte % 31.4  Monocyte % 5.7  Eosinophil % 0.8  Basophil % 0.3  Neutrophil # 4.4  Lymphocyte # 2.2  Monocyte # 0.4  Eosinophil # 0.1  Basophil # 0.0  Result(s) reported on 12 Jan 2014 at 04:37AM.    18-Feb-15 13:34, Hemoglobin  Hemoglobin (CBC) 7.3  Result(s) reported on 12 Jan 2014 at 01:49PM.   RADIOLOGY:  Radiology Results: LabUnknown:    09-Apr-14 14:04, Bone Densitometry  PACS Image    10-Dec-14 10:21, Screening Digital Mammogram  PACS Image    18-Feb-15 12:59, GI Blood Loss Study - [place]:    09-Apr-14 14:04, Bone Densitometry  Bone Densitometry  REASON FOR EXAM:    ovarain failure  COMMENTS:       PROCEDURE: BD  - BONE DENSITOMETRY  - Mar 03 2013  2:04PM     RESULT: -   Dear Dr Rutherford Nail,    Your patient Caelin Kendra completed a BMD test on 03/03/2013 using the   [employer] (analysis version: 14.10) manufactured by [employer].  The following summarizes the results of our evaluation.          PATIENT BIOGRAPHICAL:   Name: JULIE-ANNE, TORAIN         Patient ID:  [id number]  Birth Date: 1946-01-05 Height:     65.0 in.     Gender: Female Exam Date:  03/03/2013 Weight:     183.0 lbs.   Indications:        Fractures:             Treatments:    IMPRESSION: -     ASSESSMENT:   The BMD measured at [facility] L1-L4 is 0.785 g/cm2 with a T-score of -3.3.    This patient is considered osteoporotic according to [facility]) criteria.  Fracture risk is high.  Pharmacological   treatment, if not already prescribed, should be started.  A follow up   bone density test is recommended in one year to monitor response to   therapy.    Site         Region     Measured   Measured WHO            Young Adult   BMD                                   Date       Age      Classification T-score          AP Spine     L1-L4      03/03/2013 65.6     Osteoporosis   -3.3          0.785 g/cm2     DualFemur    Neck Left  03/03/2013 65.5     Osteopenia     -1.1          0.878 g/cm2   DualFemur    Neck Right 03/03/2013 65.5     Osteopenia     -1.2          0.870 g/cm2     Left Forearm Radius 33% 03/03/2013 65.5     Osteopenia-1.7  0.727 g/cm2      [facility]) criteria for post-menopausal, Caucasian   Women:    Normal:       T-score at or above -1 SD         Osteopenia:   T-score between -1 and -2.5 SD    Osteoporosis: T-score at or below -2.5 SD          RECOMMENDATIONS:   NOF Guidelines recommend treatment for patients with a T-score of -1.5   and below with risk factors or -2.0 and below without risk factors.    Effective therapies are available in the form of bisphosphonates (Fosamax   and Actonel), Miacalcin, Evista, and Forteo.  All patients should ensure   an adequate intake of dietary calcium (1200 mg/d) and vitamin D (400-800   IU daily).      FOLLOW-UP:   People with diagnosed cases of osteoporosis or at high risk for fracture  should have regular bone mineral density tests.  For patients eligible   for Medicare, routine  testing is allowed once every 2 years.  The testing   frequency can be increased to one year for patients who have rapidly     progressing disease, thosewho are receiving or discontinuing medical   therapy to restore bone mass, or have additional risk factors.    Based on these results, a follow-up exam is recommended in April 2016.        Verified By: DAVID A. Martinique, M.D., MD    10-Dec-14 10:21, Screening Digital Mammogram  Screening Digital Mammogram  REASON FOR EXAM:    SCR MAMMO NO ORDER  COMMENTS:       PROCEDURE: MAM - MAM DGTL SCRN MAM NO ORDER W/CAD  - Nov 03 2013 10:21AM     CLINICAL DATA:  Screening.    EXAM:  DIGITAL SCREENING BILATERAL MAMMOGRAM WITH CAD    COMPARISON:  Previous exam(s).    ACR Breast Density Category b: There are scattered areas of  fibroglandular density.  FINDINGS:  There are no findings suspicious for malignancy. Images were  processed with CAD.     IMPRESSION:  No mammographic evidence of malignancy. A result letter of this  screening mammogram will be mailed directly to the patient.    RECOMMENDATION:  Screening mammogram in one year. (Code:SM-B-01Y)    BI-RADS CATEGORY  1: Negative      Electronically Signed    By: Lovey Newcomer M.D.    On: 11/04/2013 08:00         Verified By: Ilsa Iha, M.D.,  Nuclear Med:    18-Feb-15 12:59, GI Blood Loss Study - Nuc Med  GI Blood Loss Study - Nuc Med  REASON FOR EXAM:    active lower GI bleed  COMMENTS:       PROCEDURE: NM  - NM GI BLOOD LOSS STUDY  - Jan 12 2014 12:59PM     CLINICAL DATA:  Bright red blood per rectum    EXAM:  NUCLEAR MEDICINE GASTROINTESTINAL BLEEDING SCAN    TECHNIQUE:  Sequential abdominal images were obtained following intravenous  administration of Tc-107mlabeled red blood cells.    COMPARISON:  None.  RADIOPHARMACEUTICALS:  24.367m Tc-9923m-vitro labeled red cells.    FINDINGS:  There is focal activity identified in the right  abdomen  corresponding to the ascending colon. It shows retrograde flow into  the cecum as well as antegrade flow into the transverse colon and  subsequent into the more  distal colon consistent with an active  colonic complete. Given its right-sided location it likely  represents a diverticular bleed.     IMPRESSION:  Findings consistent with positive GI hemorrhage in the right colon  likely related to a bleeding diverticulum.    These results will be called to the ordering clinician or  representative by the Radiologist Assistant, and communication  documented in the PACS Dashboard.      Electronically Signed    By: Inez Catalina M.D.    On: 01/12/2014 13:05         Verified By: Everlene Farrier, M.D.,   ASSESSMENT AND PLAN:  Assessment/Admission Diagnosis brisk lower GI bleed with positive bleeding scan.  I have reviewed the images and this is positive for the right colon.   Plan discussed situation with daughter.  Risks and benefits of embolization discussed, and they are agreeable to proceed.  Will perform today.  Also has poor venous access and primary service has asked [name] to place a central line   level 4   Electronic Signatures: Algernon Huxley (MD)  (Signed 18-Feb-15 15:19)  Authored: Chief Complaint and History, PAST MEDICAL/SURGICAL HISTORY, ALLERGIES, HOME MEDICATIONS, Family and Social History, Review of Systems, Physical Exam, LABS, RADIOLOGY, Assessment and Plan   Last Updated: 18-Feb-15 15:19 by Algernon Huxley (MD)

## 2015-03-18 NOTE — Consult Note (Signed)
PATIENT NAME:  Holly Green, Holly Green MR#:  [id number] DATE OF BIRTH:  Feb 17, 1946  DATE OF CONSULTATION:  01/14/2014  CONSULTING PHYSICIAN:  Harrell Gave A. Yehonatan Grandison, MD  REASON FOR CONSULTATION: Bloody stools, positive nuclear medicine bleeding scan, and status post coil embolization.   HISTORY OF PRESENT ILLNESS: Holly Green is a pleasant 69 year old female with history of diverticulosis with diverticulitis, bleed on multiple occasions, most recently in 2009 which required SMA and IMA embolization, history of colonic ulcer and AVMs, who presents with acute onset bright red blood per rectum at 2:00 a.m. on the day prior to admission. She had approximately 5 units, was passing clots, and upon admission had a nuclear medicine bleeding scan which showed right colonic bleeding and underwent right colic artery embolization. Throughout hospital course, initially at admission hemoglobin was 10.3, following embolization went to 8.7, and then slowly drifted down. On the evening of February 19, her hemoglobin was 7.9, went down to 6.6, but after 1 unit went to 8.5. Has had 2 bloody bowel movements since last evening and 1 today, but according to nursing it appeared to look old. Otherwise has no abdominal pain status post embolization. Last colonoscopy was 2012, which did not show any neoplasm. Otherwise, no headaches, fevers, chills, night sweats, shortness of breath, cough, chest pain, abdominal pain, nausea, vomiting, diarrhea, constipation, dysuria or hematuria.   PAST MEDICAL HISTORY: As follows:  1.  History of recurrent diverticular bleed, status post multiple microembolization.  2.  History of EGD showing duodenal AVMs.  3.  History of descending colon ulcer in 2012.  4.  History of gastritis.  5.  History of hypertension.  6.  History of hyperlipidemia.   ALLERGIES: No known drug allergies.   MEDICATIONS: Diltiazem, Ziac, and lovastatin.   SOCIAL HISTORY: Denies alcohol, drug, or tobacco use.   FAMILY  HISTORY: Coronary artery disease and diabetes.   REVIEW OF SYSTEMS: Twelve-point  review of systems obtained. Pertinent positives and negatives as above.   PHYSICAL EXAMINATION: VITAL SIGNS: Temperature 98.8, pulse 94, blood pressure 121/63, 99% on room air.  GENERAL: No acute distress. Alert and oriented x 3. HEAD: Normocephalic, atraumatic. EYES: No scleral icterus. No conjunctivitis. FACE: No obvious facial trauma. Normal external nose. Normal external ears. CHEST: Lungs clear to auscultation. Moving air well.  HEART: Regular rate and rhythm. No murmurs, rubs, or gallops.  ABDOMEN: Soft, nontender, nondistended.  EXTREMITIES: Moves all extremities well. Strength 5 out of 5.  NEUROLOGIC: Cranial nerves II through XII grossly intact.   LABORATORY DATA: Most recent labs were this morning. White cell count was 7.6. Hemoglobin was 6.6; repeat after 1 unit was 8.5.   IMAGING: I had reviewed the nuclear medicine scan from February 18 and agree with the finding that it appears to be a right-sided bleed.    ASSESSMENT AND PLAN: Holly Green is a pleasant 69 year old with a history of coil embolization. I do not feel there are any strong definitive stigmata of ongoing bleeding, as her hemoglobin has supratherapeutically responded to her blood transfusion. No indication for colectomy at this time. However, if does have findings consistent with continued drop in hemoglobin and hematocrit, will discuss colectomy. I have discussed this with Holly Green, and she is in agreement with this plan.   ____________________________ Glena Norfolk. Janeliz Prestwood, MD cal:jcm D: 01/14/2014 16:02:35 ET T: 01/14/2014 16:35:57 ET JOB#: [id number]  cc: Harrell Gave A. Ellison Rieth, MD, <Dictator> Floyde Parkins MD ELECTRONICALLY SIGNED 01/16/2014 13:13

## 2015-03-18 NOTE — H&P (Signed)
PATIENT NAME:  Holly Green, Holly Green MR#:  [id number] DATE OF BIRTH:  Sep 14, 1946  DATE OF ADMISSION:  01/11/2014  REFERRING PHYSICIAN: Dr. Joni Fears.   PRIMARY CARE PHYSICIAN: Dr. Rutherford Nail.  PRIMARY GASTROENTEROLOGIST: Dr. Vira Agar.   CHIEF COMPLAINT: Bloody stools.   HISTORY OF PRESENT ILLNESS: The patient is a pleasant 69 year old African American female with history of diverticulosis with history of diverticular bleed in the past requiring SMA and also IMA embolization in 2009, history of colonic ulcer and AVMs, and an EGD and colonoscopy who comes in with acute onset of bright red blood per rectum at about 2:00 a.m. last night. The patient has been feeling weak and has had dizzy spells for the past multiple days, worse in the last couple of days, but had no bleeding then. The bleeding started about 2:00 a.m. and she has had 5 episodes of bright red blood per rectum, which she describes as a lot. She is passing clots as well and she just last passed clots a few minutes ago without any bright red blood per rectum this time around and the clots are described as large and multiple. Her blood count is 10.3 and the hospitalist service was contacted for further evaluation and management.   PAST MEDICAL HISTORY:  1.  Diverticulosis and diverticular bleed 2003 and another one in 2009 the last of which required microembolization of SMA/IMA with vascular surgery.  2.  EGD showing duodenal AVMs, which were not actively bleeding.  3.  Ulcer in descending colon in 2012.  4.  Gastritis.  5.  Hypertension.  6.  Dyslipidemia.   ALLERGIES: No known drug allergies.   OUTPATIENT MEDICATIONS: Diltiazem 120 mg 2 times a day, Ziac 10/6.25 mg once a day, lovastatin 20 mg daily.   SOCIAL HISTORY: No tobacco, alcohol, or drug use.   FAMILY HISTORY: Coronary artery disease, heart issues, and diabetes.  REVIEW OF SYSTEMS: CONSTITUTIONAL: No fever. Positive for fatigue and weakness in the last couple of days, worse than  the last multiple days. The weakness started about last week.  EYES: No blurry vision or double vision.  ENT: No tinnitus or hearing loss.  RESPIRATORY: No cough, wheezing, or shortness of breath.  CARDIOVASCULAR: No chest pain or orthopnea.  GASTROINTESTINAL: No nausea, vomiting, or hematemesis. Denies melena prior to the bright red blood per rectum episodes. Has some abdominal cramps at times, but the bleed is not painful. History of GI bleed as above.  GENITOURINARY: Denies dysuria, hematuria.  HEMATOLOGIC AND LYMPHATIC: Denies easy bruising, but she said she went to donate blood several months ago and they rejected her as they said her blood count was too low. ENDOCRINE: Denies polyuria, nocturia.  MUSCULOSKELETAL: Denies arthritis or gout. Has buttock pain if she stands too much at work.  PSYCHIATRIC: No anxiety or depression.   PHYSICAL EXAMINATION: VITAL SIGNS: Temperature on arrival 97.9, pulse rate 66, respiratory rate 18, blood pressure 97/67, O2 sat 99% on room air.  GENERAL: The patient is a well-developed female sitting in bed.  HEENT: Normocephalic, atraumatic. Pupils are equal and reactive. Extraocular muscles intact. Moist mucous membranes.  NECK: Supple. No thyroid tenderness. No cervical lymphadenopathy.  CARDIOVASCULAR: S1 and S2 regular. No significant murmurs appreciated.  LUNGS: Clear to auscultation without wheezing, rhonchi, or rales.  ABDOMEN: Soft, hypoactive, nontender. No rebound or guarding.  EXTREMITIES: No pitting edema.  NEUROLOGIC: Cranial nerves II through XII grossly intact. Strength is 5 out of 5 in all extremities. Sensation is intact to light touch.  PSYCHIATRIC: Awake, alert, and oriented x3.  SKIN: No obvious rashes or lesions.  LABORATORY DATA: Glucose 147. BUN 19, creatinine 1.04, sodium 142, potassium 3.4. LFTs showed albumin of 3 and total protein 6.2, otherwise within normal limits. Blood count showing hemoglobin of 10.3, hematocrit 31.4,  platelets 217, and white count 7.9. INR is 1. UA is not suggestive of infection.   ASSESSMENT AND PLAN: We have a 69 year old female with history of diverticular bleed in the past requiring embolization of inferior mesenteric artery and superior mesenteric artery and history of duodenal arteriovenous malformations and descending colonic ulcer who presents with acute onset lower gastrointestinal bleed.   The patient will be admitted to the hospital, and I have discussed the case with Dr. Rayann Heman from gastroenterology. The patient at this point has a blood count of 10 and has relative hypotension as she usually is hypertensive and on multiple blood pressure medications. She is not tachycardic as she is on diltiazem, which is blunting the tachycardic response. At this point, I would hold the long acting diltiazem and the Ziac, start the patient on some gentle fluids, and trend hemoglobin every 8 hours and obtain a gastroenterology consult. If she does bleed again, I would order a bleeding scan for active bleeding and consider vascular consult and embolization, if that is an option, if she has ongoing bleeding. Would monitor though hemoglobin every 8 hours. I have consented the patient and explained the risks and benefits of blood transfusion, and she has accepted. The consent is in the chart. She denies having any excessive NSAIDs or aspirin and although brisk upper gastrointestinal bleed is possible, I suspect that this is more likely diverticular bleed in nature. The patient does have an acute anemia; however, I do not have any recent labs and this could be acute posthemorrhagic anemia or more of a chronic nature as she had wanted to donate blood a few months ago and she was rejected. I would continue the statin and start her on SCDs and TEDs hose for deep vein thrombosis prophylaxis. For her hypokalemia, replace the potassium. I would also make her n.p.o. except meds at this point. The patient is FULL code.   The  case was discussed with the ER physician and Dr. Rayann Heman from gastroenterology.  TOTAL TIME SPENT: 50 minutes.  ____________________________ Vivien Presto, MD sa:sb D: 01/11/2014 13:18:48 ET T: 01/11/2014 13:45:28 ET JOB#: [id number]  cc: Vivien Presto, MD, <Dictator> Vivien Presto MD ELECTRONICALLY SIGNED 01/28/2014 13:09

## 2015-03-18 NOTE — Consult Note (Signed)
GI note.  S/p coil embolization today.  Will cancel the colonoscopy for tomorrow.  Will only perform colonoscopy for further bleeding.   Electronic Signatures: Arther Dames (MD)  (Signed on 18-Feb-15 17:53)  Authored  Last Updated: 18-Feb-15 17:53 by Arther Dames (MD)

## 2015-03-18 NOTE — Consult Note (Signed)
Chief Complaint:  Subjective/Chief Complaint Cross cover for Dr. Rayann Heman. The patient reports that she has not had a bowel movement or seen any sign of further GI bleeding.Her Hb is down today slightly. She denies any abd pain today but has some "rumbling"   VITAL SIGNS/ANCILLARY NOTES: **Vital Signs.:   21-Feb-15 07:00  Vital Signs Type Routine  Temperature Temperature (F) 99.1  Celsius 37.2  Temperature Source oral  Pulse Pulse 102  Pulse source if not from Vital Sign Device per cardiac monitor  Respirations Respirations 18  Systolic BP Systolic BP 643  Diastolic BP (mmHg) Diastolic BP (mmHg) 67  Mean BP 85  Pulse Ox % Pulse Ox % 99  Oxygen Delivery Room Air/ 21 %  Pulse Ox Heart Rate 104   Brief Assessment:  GEN well developed, well nourished, no acute distress   Respiratory normal resp effort   Gastrointestinal Normal   Gastrointestinal details normal Soft  Nontender  Nondistended   Additional Physical Exam Alert and orientated times 3   Lab Results: Routine Chem:  21-Feb-15 04:42   Result Comment LABS - This specimen was collected through an   - indwelling catheter or arterial line.  - A minimum of 73ms of blood was wasted prior    - to collecting the sample.  Interpret  - results with caution.  Result(s) reported on 15 Jan 2014 at 05:09AM.  Result Comment CALCIUM - RESULTS VERIFIED BY REPEAT TESTING.  - C/LIBBY COBB/0515/01-15-14/RWW  - NOTIFIED OF CRITICAL VALUE  - READ-BACK PROCESS PERFORMED.  Result(s) reported on 15 Jan 2014 at 05:09AM.  Glucose, Serum 89  BUN  3  Creatinine (comp) 0.71  Sodium, Serum 142  Potassium, Serum  3.3  Chloride, Serum  111  CO2, Serum 29  Calcium (Total), Serum  6.9  Anion Gap  2  Osmolality (calc) 279  eGFR (African American) >60  eGFR (Non-African American) >60 (eGFR values <640mmin/1.73 m2 may be an indication of chronic kidney disease (CKD). Calculated eGFR is useful in patients with stable renal function. The eGFR  calculation will not be reliable in acutely ill patients when serum creatinine is changing rapidly. It is not useful in  patients on dialysis. The eGFR calculation may not be applicable to patients at the low and high extremes of body sizes, pregnant women, and vegetarians.)  Routine Hem:  21-Feb-15 04:42   WBC (CBC) 7.9  RBC (CBC)  2.53  Hemoglobin (CBC)  7.7  Hematocrit (CBC)  22.2  Platelet Count (CBC)  127  MCV 88  MCH 30.5  MCHC 34.7  RDW 14.2  Neutrophil % 66.7  Lymphocyte % 22.8  Monocyte % 8.0  Eosinophil % 2.2  Basophil % 0.3  Neutrophil # 5.3  Lymphocyte # 1.8  Monocyte # 0.6  Eosinophil # 0.2  Basophil # 0.0   Assessment/Plan:  Assessment/Plan:  Assessment Lower GI bleed s/p embolization. Hb slightly down. no pain or further bleeding seen.   Plan Will continue to follow her Hb. Nothing new to add.   Electronic Signatures: WoLucilla LameMD)  (Signed 21-Feb-15 09:52)  Authored: Chief Complaint, VITAL SIGNS/ANCILLARY NOTES, Brief Assessment, Lab Results, Assessment/Plan   Last Updated: 21-Feb-15 09:52 by WoLucilla LameMD)

## 2015-03-18 NOTE — Op Note (Signed)
PATIENT NAME:  Holly Green, Holly Green MR#:  [id number] DATE OF BIRTH:  1945-11-29  DATE OF PROCEDURE:  01/12/2014  PREOPERATIVE DIAGNOSES: 1.  Lower gastrointestinal bleed with hypotension and anemia.  2.  Previous history of gastrointestinal bleeding, status post embolization in 2009.  3.  Hypertension.  4.  Hyperlipidemia.   POSTOPERATIVE DIAGNOSES: 1.  Lower gastrointestinal bleed with hypotension and anemia.  2.  Previous history of gastrointestinal bleeding, status post embolization in 2009.  3.  Hypertension.  4.  Hyperlipidemia.   PROCEDURES:   1.  Catheter placement into right colic branch of SMA from right femoral approach.  2.  Aortogram and selective SMA angiogram.  3.  Polyvinyl alcohol 500-700 micron microbead embolization to right colic artery.  4.  Ultrasound guidance for vascular access, right jugular vein.  5.  Placement of right jugular vein triple-lumen catheter.  6.  StarClose closure device, right femoral artery.   SURGEON: Algernon Huxley, MD   ANESTHESIA: Local with moderate conscious sedation.   ESTIMATED BLOOD LOSS: Approximately 25 mL.  FLUOROSCOPY TIME: About 4 minutes.   CONTRAST USED: 30 mL.   INDICATION FOR PROCEDURE: This is a 69 year old African American female with brisk lower GI bleeding. She has had to be transferred to the unit for hypotension and anemia. Bleeding scan was positive for the right colon. We saw the patient and discussed options. Embolization was a reasonable option for treatment of her GI bleed. We are asked by the primary service to place a central line as well.   DESCRIPTION OF PROCEDURE: The patient is brought to the vascular suite. The groins were sterilely prepped and draped and a sterile surgical field was created. The right femoral head was localized with fluoroscopy. The right femoral artery was accessed without difficulty with a Seldinger needle. A J-wire and 5-French sheath were placed. Pigtail catheter was placed at the T12 level  and normal origins of the celiac, SMA, and renal arteries were seen. I then used a VS2 catheter to selectively cannulate the SMA. Through the VS1 catheter, a prograde catheter was taken out and easily selectively cannulated the right colic artery, with a downgoing branch to the cecal artery as well. I initially deployed two 0.5 mL doses of 500-700 micron polyvinyl alcohol beads. There was still reasonably brisk flow and a somewhat hypervascular area in the right colon. I treated with another full milliliter of 500-700 micron polyvinyl alcohol beads, with the main vessels remaining patent but diminishment of the flow to this area, which was our goal of therapy. At this point, I elected to terminate the procedure. The diagnostic and microcatheters were removed. Oblique arteriogram was performed of the right femoral artery and the StarClose closure device deployed with excellent hemostatic result.   I then turned my attention to the right neck. The right jugular vein was visualized and found to be patent. It was then accessed with a micropuncture needle. A micropuncture wire and sheath were then placed and we upsized to the J wire. Using fluoroscopic guidance, a 20 cm long triple-lumen catheter was placed over the wire with its tip parked at the cavoatrial junction and the wire was removed. All 3 lumens withdrew blood well and flushed easily with heparinized saline. It was secured to the skin at about 16 cm at the skin entry site with 3 silk sutures. The patient was awakened from anesthesia and taken to the recovery room in stable condition, having tolerated the procedure well.    ____________________________ Algernon Huxley,  MD jsd:jcm D: 01/12/2014 16:11:15 ET T: 01/12/2014 17:14:48 ET JOB#: [id number]  cc: Algernon Huxley, MD, <Dictator> Algernon Huxley MD ELECTRONICALLY SIGNED 01/20/2014 12:02

## 2015-03-18 NOTE — Consult Note (Signed)
Brief Consult Note: Diagnosis: LGIB.   Patient was seen by consultant.   Consult note dictated.   Recommend to proceed with surgery or procedure.   Orders entered.   Comments: Recs:  monitor H/H - tagged RBC scan if evidence of active bleeding - colonoscopy on Thursday - clears now and tomorrow, golytely tomorrow evening.  Electronic Signatures: Arther Dames (MD)  (Signed 17-Feb-15 17:12)  Authored: Brief Consult Note   Last Updated: 17-Feb-15 17:12 by Arther Dames (MD)

## 2015-03-18 NOTE — Consult Note (Signed)
PATIENT NAME:  Holly Green, Holly Green MR#:  [id number] DATE OF BIRTH:  August 25, 1946  DATE OF CONSULTATION:  01/11/2014  REFERRING PHYSICIAN:  Dr. Vivien Presto CONSULTING PHYSICIAN:  Holly Dames, MD  REASON FOR THE CONSULT:  Rectal bleeding.   HISTORY OF PRESENT ILLNESS:  Holly Green is a 69 year old female with a past medical history notable for a diverticular bleed status post embolization, hypertension, hyperlipidemia, who presented to the Emergency Room for evaluation of rectal bleeding. Holly Green reports that she has not had issues with rectal bleeding in approximately the past year and had onset of multiple episodes of bright red blood per rectum since yesterday evening. She does not believe that there was any stool mixed in there. It was just episodes of bright red and clots. This is similar to bleeds that she has had in the past.   Of note, she did have an upper endoscopy in December 2012, which showed a hiatal hernia and erosive gastropathy. She also had a colonoscopy in December 2012 for abdominal pain, which showed just a solitary ulcer in the descending colon. She also has had a history of diverticular bleed status post embolization.   PAST MEDICAL HISTORY:  1.  Diverticulosis.  2.  Diverticular bleed, 2003, 2009.  3.  Gastritis.  4.  Hypertension.  5.  Hyperlipidemia.   ALLERGIES:  NKDA.   OUTPATIENT MEDICATIONS:   1.  Diltiazem 120 mg b.i.d. 2.  Ziac 10/6.25 daily.  3.  Lovastatin 20 mg daily.   SOCIAL HISTORY:  She denies to me any alcohol, tobacco, or recreational drugs.   FAMILY HISTORY:  She denies any family history of colon cancer or other GI malignancy.   REVIEW OF SYSTEMS:  A 10-system review was conducted. It is negative except as stated in the HPI.   PHYSICAL EXAMINATION:   VITAL SIGNS:  Her current temperature is 98.9, her pulse is 69, blood pressure is 122/73, respirations are 17, pulse ox is 98% on room air. He can wind.  GENERAL: Alert and oriented times 4.  No acute  distress. Appears stated age. HEENT: Normocephalic/atraumatic. Extraocular movements are intact. Anicteric. NECK: Soft, supple. JVP appears normal. No adenopathy. CHEST: Clear to auscultation. No wheeze or crackle. Respirations unlabored. HEART: Regular. No murmur, rub, or gallop.  Normal S1 and S2. ABDOMEN: Soft, nontender, nondistended.  Normal active bowel sounds in all four quadrants.  No organomegaly. No masses EXTREMITIES: No swelling, well perfused. SKIN: No rash or lesion. Skin color, texture, turgor normal. NEUROLOGICAL: Grossly intact. PSYCHIATRIC: Normal tone and affect. MUSCULOSKELETAL: No joint swelling or erythema.   LABORATORY DATA:  Sodium 142, potassium 3.4, BUN 19, creatinine 1.04. Lipase is 199, albumin 3.0. Liver enzymes are otherwise normal. White count is 8; hemoglobin is 10.3, which is down to 8.3 on repeat; platelets are 217. INR is 1.0.  ENDOSCOPY FINDINGS:  See HPI.   ASSESSMENT AND PLAN:  Lower gastrointestinal bleeding: She did have multiple episodes of bright red blood per rectum. She also now has had a drop in her hemoglobin from 10.3 to 8.3. It sounds as though she has not had any further bleeding so far this evening. Most likely, the source of this bleeding is diverticular. However, given her history of a solitary ulcer and other potential etiologies on the differential and given her last colonoscopy was well over a year ago, we will plan to repeat a colonoscopy.   PLAN:  We will plan for a colonoscopy on Thursday. She should remain on clear liquids  in the meantime. She will drink a GoLYTELY split prep starting Wednesday evening and then finishing up on Thursday morning. In the meantime, if she does develop any active bleeding, she should go for a tagged red cell scan. Further recommendations will be pending the findings on the colonoscopy. Thank you for this consult.     ____________________________ Holly Dames, MD mr:ms D: 01/11/2014 21:44:00  ET T: 01/11/2014 21:55:12 ET JOB#: [id number]  cc: Holly Dames, MD, <Dictator> Mellody Life MD ELECTRONICALLY SIGNED 01/18/2014 13:47

## 2015-03-18 NOTE — Consult Note (Signed)
Chief Complaint:  Subjective/Chief Complaint Patient without any further signs of lower GI bleeding. Says she has gas but has not had a bowel movement.   VITAL SIGNS/ANCILLARY NOTES: **Vital Signs.:   22-Feb-15 04:00  Vital Signs Type Routine  Temperature Temperature (F) 98.3  Celsius 36.8  Temperature Source oral  Pulse Pulse 94  Respirations Respirations 20  Systolic BP Systolic BP 876  Diastolic BP (mmHg) Diastolic BP (mmHg) 82  Mean BP 99  Pulse Ox % Pulse Ox % 100  Pulse Ox Activity Level  At rest  Oxygen Delivery Room Air/ 21 %   Brief Assessment:  GEN well developed, well nourished, no acute distress   Respiratory normal resp effort  no use of accessory muscles   Additional Physical Exam Alert and orientated times 3   Lab Results: Routine Chem:  22-Feb-15 05:14   Result Comment LABS - This specimen was collected through an   - indwelling catheter or arterial line.  - A minimum of 47ms of blood was wasted prior    - to collecting the sample.  Interpret  - results with caution.  Result(s) reported on 16 Jan 2014 at 05:32AM.  Glucose, Serum  104  BUN  2  Creatinine (comp) 0.71  Sodium, Serum 141  Potassium, Serum 3.5  Chloride, Serum  108  CO2, Serum 28  Calcium (Total), Serum  7.6  Anion Gap  5  Osmolality (calc) 278  eGFR (African American) >60  eGFR (Non-African American) >60 (eGFR values <681mmin/1.73 m2 may be an indication of chronic kidney disease (CKD). Calculated eGFR is useful in patients with stable renal function. The eGFR calculation will not be reliable in acutely ill patients when serum creatinine is changing rapidly. It is not useful in  patients on dialysis. The eGFR calculation may not be applicable to patients at the low and high extremes of body sizes, pregnant women, and vegetarians.)  Routine Hem:  22-Feb-15 05:14   WBC (CBC) 5.9  RBC (CBC)  2.50  Hemoglobin (CBC)  7.6  Hematocrit (CBC)  22.2  Platelet Count (CBC) 154  MCV 89   MCH 30.5  MCHC 34.3  RDW 14.1  Neutrophil % 67.4  Lymphocyte % 21.6  Monocyte % 8.4  Eosinophil % 2.2  Basophil % 0.4  Neutrophil # 4.0  Lymphocyte # 1.3  Monocyte # 0.5  Eosinophil # 0.1  Basophil # 0.0   Assessment/Plan:  Assessment/Plan:  Assessment Lower GI bleed. Hb down today but no obvious sign of bleeding except some blood on the paper when she wipes.   Plan Continue supportive care. Follow Hb. Dr. ReRayann Hemano resume care tomorrow.   Electronic Signatures: WoLucilla LameMD)  (Signed [id number])  Authored: Chief Complaint, VITAL SIGNS/ANCILLARY NOTES, Brief Assessment, Lab Results, Assessment/Plan   Last Updated: 22-Feb-15 09:49 by WoLucilla LameMD)

## 2015-03-18 NOTE — Discharge Summary (Signed)
PATIENT NAME:  Holly Green, LYLES MR#:  [id number] DATE OF BIRTH:  1946-08-11  DATE OF ADMISSION:  01/11/2014 DATE OF DISCHARGE:  01/17/2014  DISCHARGE DIAGNOSES: 1.  Lower gastrointestinal bleed status post embolization on 18th of February, likely diverticular bleed, presented with shock due to bleeding.  2.  Anemia due to blood loss. Stable hemoglobin after 3 units of transfusion.  3.  Left ankle pain, likely ligament or muscle injury.  4.  History of hypertension, normotensive in hospital, did not need any medications.   DISCHARGE MEDICATIONS: 1.  Lovastatin 20 mg oral tablet once a day. 2.  Acetaminophen and oxycodone.  2.  Pantoprazole 40 mg 2 times a day.  3.  Ferrous sulfate 325 mg 3 times a day.   DISCHARGE DIET: Low-sodium.  ACTIVITY: As tolerated.   TIMEFRAME TO FOLLOW-UP: Within 1 to 2 weeks in GI clinic. Follow up in GI clinic in 1 week to check hemoglobin. If pain in ankle does not get better in 1 week, need to see orthopedic clinic. Advised to follow with Dr. Arther Dames.  HISTORY OF PRESENTING ILLNESS: The patient is a 69 year old African American female with history of diverticulosis and diverticular bleeding in the past requiring superior mesentery artery and inferior mesentery artery embolization in 2009, history of colonic ulcer and AV malformations, came in with bright red blood per rectum about 2 in the morning, was feeling extremely weak and having dizzy spells.  HOSPITAL COURSE AND STAY:  1.  Hemorrhagic shock. The patient was hypotensive and tachycardic on presentation.  Two units of PRBC was given on the 18th, another unit on 19th, but still hemoglobin dropped after embolization so another 1 unit was given on 28th of February. After that hemoglobin remained stable, normal bowel movements, blood pressure was stable, tolerating full liquid diet.  2.  Lower GI bleed. Bleeding scan was positive and the patient received mesenteric artery embolization on the 18th of  February. After that bleeding stopped gradually. 3.  Acute on chronic anemia. Received 3 units of blood transfusion.  4.  Hypertension. We held the medication initially because of hypertension and then blood pressure remained stable.  5.  Esophageal reflux disease. PPI was started. 6.  Complaint of left ankle pain. There was no fall, no fracture on x-ray. DVT study was negative. Likely it was muscle ligamental injury. Pain medication was given and an elastic wrap was given.   CONSULTANTS IN HOSPITAL: Dr. Arther Dames, GI.   LABORATORY DATA: WBC count was 7.9, hemoglobin was 10.3, platelet count was 217,000. Glucose 147, BUN 19, creatinine 1.04, sodium 142, potassium 3.4. INR 1. Hemoglobin dropped to 7.2 on the 18th. Hemoglobin was 6.6 on the 19th. Hemoglobin was 7.4 on discharge.   TOTAL TIME SPENT ON THIS DISCHARGE: 40 minutes. ____________________________ Ceasar Lund Anselm Jungling, MD vgv:sb D: 01/20/2014 22:23:33 ET T: 01/21/2014 07:48:21 ET JOB#: [id number]  cc: Ceasar Lund. Anselm Jungling, MD, <Dictator> Arther Dames, MD Ashok Norris, MD Vaughan Basta MD ELECTRONICALLY SIGNED 01/23/2014 0:56

## 2015-05-30 ENCOUNTER — Encounter: Payer: Self-pay | Admitting: Family Medicine

## 2015-05-30 ENCOUNTER — Encounter (INDEPENDENT_AMBULATORY_CARE_PROVIDER_SITE_OTHER): Payer: Self-pay

## 2015-05-30 ENCOUNTER — Ambulatory Visit (INDEPENDENT_AMBULATORY_CARE_PROVIDER_SITE_OTHER): Payer: Medicare Other | Admitting: Family Medicine

## 2015-05-30 VITALS — BP 120/60 | HR 70 | Temp 98.0°F | Resp 16 | Ht 64.0 in | Wt 177.1 lb

## 2015-05-30 DIAGNOSIS — K5731 Diverticulosis of large intestine without perforation or abscess with bleeding: Secondary | ICD-10-CM

## 2015-05-30 DIAGNOSIS — Z780 Asymptomatic menopausal state: Secondary | ICD-10-CM

## 2015-05-30 DIAGNOSIS — E785 Hyperlipidemia, unspecified: Secondary | ICD-10-CM

## 2015-05-30 DIAGNOSIS — I10 Essential (primary) hypertension: Secondary | ICD-10-CM

## 2015-05-30 NOTE — Progress Notes (Signed)
Name: Holly Green   MRN: [id number]    DOB: May 02, 1946   Date:05/30/2015       Progress Note  Subjective  Chief Complaint  Chief Complaint  Patient presents with  . Hypertension  . Leg Pain    cramping  . Diverticulosis    Hypertension This is a chronic problem. The current episode started more than 1 year ago. The problem is unchanged. The problem is controlled. Associated symptoms include anxiety. Pertinent negatives include no blurred vision, chest pain, headaches, neck pain, orthopnea, palpitations or shortness of breath. There are no associated agents to hypertension. Risk factors for coronary artery disease include sedentary lifestyle and post-menopausal state. Past treatments include beta blockers and diuretics. The current treatment provides moderate improvement. There are no compliance problems.   Leg Pain  There was no injury mechanism. The pain is present in the left hip and right hip. The quality of the pain is described as aching. The pain has been intermittent since onset. Pertinent negatives include no tingling. The symptoms are aggravated by weight bearing. She has tried acetaminophen for the symptoms. The treatment provided mild relief.   Diverticulosis  Patient has had a small diverticular bleed which she managed as an outpatient by changing her diet and continuing on her PPI. There was minimal associated pain. She has not been hospitalized again since the last visit. Bleeding is now resolved.    Postmenopausal state  Patient is many years postmenopausal. She is currently on Fosamax regularly. She recently was on a higher dosage of vitamin D but now is back on over-the-counter twice a day. She is due for a bone density  Past Medical History  Diagnosis Date  . Hyperlipidemia   . Hypertension   . GERD (gastroesophageal reflux disease)   . Osteoporosis     History  Substance Use Topics  . Smoking status: Never Smoker   . Smokeless tobacco: Not on file  .  Alcohol Use: No     Current outpatient prescriptions:  .  lovastatin (MEVACOR) 20 MG tablet, Take 20 mg by mouth at bedtime., Disp: , Rfl:  .  omeprazole (PRILOSEC) 20 MG capsule, Take 20 mg by mouth daily., Disp: , Rfl:  .  alendronate (FOSAMAX) 70 MG tablet, , Disp: , Rfl:  .  bisoprolol-hydrochlorothiazide (ZIAC) 10-6.25 MG per tablet, , Disp: , Rfl:   No Known Allergies  Review of Systems  Constitutional: Negative for fever, chills and weight loss.  HENT: Negative for congestion, hearing loss, sore throat and tinnitus.   Eyes: Negative for blurred vision, double vision and redness.  Respiratory: Negative for cough, hemoptysis and shortness of breath.   Cardiovascular: Negative for chest pain, palpitations, orthopnea, claudication and leg swelling.  Gastrointestinal: Positive for abdominal pain and blood in stool. Negative for heartburn, nausea, vomiting, diarrhea and constipation.  Genitourinary: Negative for dysuria, urgency, frequency and hematuria.  Musculoskeletal: Negative for myalgias, back pain, joint pain, falls and neck pain.       Leg  pain  Skin: Negative for itching.  Neurological: Negative for dizziness, tingling, tremors, focal weakness, seizures, loss of consciousness, weakness and headaches.  Endo/Heme/Allergies: Does not bruise/bleed easily.  Psychiatric/Behavioral: Negative for depression and substance abuse. The patient is not nervous/anxious and does not have insomnia.      Objective  Filed Vitals:   05/30/15 1211  BP: 120/60  Pulse: 70  Temp: 98 F (36.7 C)  TempSrc: Oral  Resp: 16  Height: 5\' 4"  (1.626 m)  Weight:  177 lb 1.6 oz (80.332 kg)  SpO2: 98%     Physical Exam  Constitutional: She is oriented to person, place, and time and well-developed, well-nourished, and in no distress.  HENT:  Head: Normocephalic.  Eyes: EOM are normal. Pupils are equal, round, and reactive to light.  Neck: Normal range of motion. No thyromegaly present.   Cardiovascular: Normal rate, regular rhythm and normal heart sounds.   No murmur heard. Pulmonary/Chest: Effort normal and breath sounds normal.  Abdominal: Soft. Bowel sounds are normal.  Musculoskeletal: Normal range of motion. She exhibits no edema.  Neurological: She is alert and oriented to person, place, and time. No cranial nerve deficit. Gait normal.  Skin: Skin is warm and dry. No rash noted.  Psychiatric: Memory and affect normal.      Assessment & Plan  1. Post-menopause - DG Bone Density; Future  2. Essential hypertension Well-controlled  3. Hyperlipemia Well-controlled  4. Diverticulosis of colon with hemorrhage With recent flare but now resolved

## 2015-05-30 NOTE — Patient Instructions (Signed)

## 2015-06-14 ENCOUNTER — Telehealth: Payer: Self-pay | Admitting: Family Medicine

## 2015-06-14 NOTE — Telephone Encounter (Signed)
Patient is checking status on her bone density referral.

## 2015-06-16 NOTE — Telephone Encounter (Signed)
Called Patient and her Bone density is scheduled on Wednesday 06-21-15 at 1:00pm.

## 2015-06-16 NOTE — Telephone Encounter (Signed)
Bone density was ordered on 05/30/2015 according to notes.

## 2015-06-21 ENCOUNTER — Ambulatory Visit
Admission: RE | Admit: 2015-06-21 | Discharge: 2015-06-21 | Disposition: A | Discharge status: Home / Self Care | Payer: Medicare Other | Source: Ambulatory Visit | Attending: Family Medicine | Admitting: Family Medicine

## 2015-06-21 DIAGNOSIS — M8580 Other specified disorders of bone density and structure, unspecified site: Secondary | ICD-10-CM | POA: Diagnosis not present

## 2015-06-21 DIAGNOSIS — Z780 Asymptomatic menopausal state: Secondary | ICD-10-CM | POA: Diagnosis present

## 2015-06-21 DIAGNOSIS — M810 Age-related osteoporosis without current pathological fracture: Secondary | ICD-10-CM | POA: Diagnosis not present

## 2015-07-07 ENCOUNTER — Telehealth: Payer: Self-pay | Admitting: Family Medicine

## 2015-07-07 MED ORDER — BISOPROLOL-HYDROCHLOROTHIAZIDE 10-6.25 MG PO TABS: 1.0000 | ORAL_TABLET | Freq: Every day | ORAL | Status: DC

## 2015-07-07 NOTE — Telephone Encounter (Signed)
Pt would like refill on Ziac to be sent to [facility].

## 2015-07-20 ENCOUNTER — Telehealth: Payer: Self-pay | Admitting: Family Medicine

## 2015-07-20 MED ORDER — LOVASTATIN 20 MG PO TABS: 20.0000 mg | ORAL_TABLET | Freq: Every day | ORAL | Status: DC

## 2015-07-20 NOTE — Telephone Encounter (Signed)
Pt informed

## 2015-07-20 NOTE — Telephone Encounter (Signed)
Refill sent to pharmacy.   

## 2015-07-20 NOTE — Telephone Encounter (Signed)
Pt needs refill on lovastatin to be sent to [facility] [address].

## 2015-08-08 ENCOUNTER — Emergency Department
Admission: EM | Admit: 2015-08-08 | Discharge: 2015-08-08 | Disposition: A | Discharge status: Home / Self Care | Payer: Medicare Other | Attending: Emergency Medicine | Admitting: Emergency Medicine

## 2015-08-08 ENCOUNTER — Emergency Department: Payer: Medicare Other

## 2015-08-08 DIAGNOSIS — Z79899 Other long term (current) drug therapy: Secondary | ICD-10-CM | POA: Insufficient documentation

## 2015-08-08 DIAGNOSIS — I10 Essential (primary) hypertension: Secondary | ICD-10-CM | POA: Diagnosis not present

## 2015-08-08 DIAGNOSIS — R42 Dizziness and giddiness: Secondary | ICD-10-CM | POA: Diagnosis not present

## 2015-08-08 LAB — BASIC METABOLIC PANEL
Anion gap: 8 (ref 5–15)
BUN: 17 mg/dL (ref 6–20)
CO2: 28 mmol/L (ref 22–32)
Calcium: 9.1 mg/dL (ref 8.9–10.3)
Chloride: 104 mmol/L (ref 101–111)
Creatinine, Ser: 0.66 mg/dL (ref 0.44–1.00)
GFR calc Af Amer: >60 mL/min (ref >60)
GFR calc non Af Amer: >60 mL/min (ref >60)
Glucose, Bld: 131 mg/dL — ABNORMAL HIGH (ref 65–99)
Potassium: 3 mmol/L — ABNORMAL LOW (ref 3.5–5.1)
Sodium: 140 mmol/L (ref 135–145)

## 2015-08-08 LAB — URINALYSIS COMPLETE WITH MICROSCOPIC (ARMC ONLY)
Bacteria, UA: NONE SEEN
Bilirubin Urine: NEGATIVE
Glucose, UA: 50 mg/dL — AB
Hgb urine dipstick: NEGATIVE
Ketones, ur: NEGATIVE mg/dL
Leukocytes, UA: NEGATIVE
Nitrite: NEGATIVE
Protein, ur: NEGATIVE mg/dL
Specific Gravity, Urine: 1.005 (ref 1.005–1.030)
Squamous Epithelial / LPF: NONE SEEN
pH: 8 (ref 5.0–8.0)

## 2015-08-08 LAB — CBC
HCT: 33.1 % — ABNORMAL LOW (ref 35.0–47.0)
Hemoglobin: 10.6 g/dL — ABNORMAL LOW (ref 12.0–16.0)
MCH: 26.2 pg (ref 26.0–34.0)
MCHC: 32 g/dL (ref 32.0–36.0)
MCV: 82.1 fL (ref 80.0–100.0)
Platelets: 224 10*3/uL (ref 150–440)
RBC: 4.04 MIL/uL (ref 3.80–5.20)
RDW: 15 % — ABNORMAL HIGH (ref 11.5–14.5)
WBC: 5.5 10*3/uL (ref 3.6–11.0)

## 2015-08-08 LAB — GLUCOSE, CAPILLARY: Glucose-Capillary: 98 mg/dL (ref 65–99)

## 2015-08-08 MED ORDER — MECLIZINE HCL 32 MG PO TABS: 32.0000 mg | ORAL_TABLET | Freq: Three times a day (TID) | ORAL | Status: DC | PRN

## 2015-08-08 MED ORDER — ONDANSETRON HCL 4 MG/2ML IJ SOLN
INTRAMUSCULAR | Status: AC
  Administered 2015-08-08: 4 mg
  Filled 2015-08-08: qty 2

## 2015-08-08 MED ORDER — ONDANSETRON HCL 4 MG/2ML IJ SOLN: 4.0000 mg | Freq: Once | INTRAMUSCULAR | Status: AC

## 2015-08-08 MED ORDER — MECLIZINE HCL 25 MG PO TABS
25.0000 mg | ORAL_TABLET | Freq: Once | ORAL | Status: AC
  Administered 2015-08-08: 25 mg via ORAL
  Filled 2015-08-08: qty 1

## 2015-08-08 NOTE — ED Notes (Signed)
Patient returned from MRI.

## 2015-08-08 NOTE — Discharge Instructions (Signed)
We believe your symptoms were caused by benign vertigo.  Please read through the included information and take any prescribed medication(s).  Follow up with your doctor as listed above. Your MRI was reassuring and did not show any evidence of a stroke.  If you develop any new or worsening symptoms that concern you, including but not limited to persistent dizziness/vertigo, numbness or weakness in your arms or legs, altered mental status, persistent vomiting, or fever greater than 101, please return immediately to the Emergency Department.   Benign Positional Vertigo Vertigo means you feel like you or your surroundings are moving when they are not. Benign positional vertigo is the most common form of vertigo. Benign means that the cause of your condition is not serious. Benign positional vertigo is more common in older adults. CAUSES  Benign positional vertigo is the result of an upset in the labyrinth system. This is an area in the middle ear that helps control your balance. This may be caused by a viral infection, head injury, or repetitive motion. However, often no specific cause is found. SYMPTOMS  Symptoms of benign positional vertigo occur when you move your head or eyes in different directions. Some of the symptoms may include:  Loss of balance and falls.  Vomiting.  Blurred vision.  Dizziness.  Nausea.  Involuntary eye movements (nystagmus). DIAGNOSIS  Benign positional vertigo is usually diagnosed by physical exam. If the specific cause of your benign positional vertigo is unknown, your caregiver may perform imaging tests, such as magnetic resonance imaging (MRI) or computed tomography (CT). TREATMENT  Your caregiver may recommend movements or procedures to correct the benign positional vertigo. Medicines such as meclizine, benzodiazepines, and medicines for nausea may be used to treat your symptoms. In rare cases, if your symptoms are caused by certain conditions that affect the  inner ear, you may need surgery. HOME CARE INSTRUCTIONS   Follow your caregiver's instructions.  Move slowly. Do not make sudden body or head movements.  Avoid driving.  Avoid operating heavy machinery.  Avoid performing any tasks that would be dangerous to you or others during a vertigo episode.  Drink enough fluids to keep your urine clear or pale yellow. SEEK IMMEDIATE MEDICAL CARE IF:   You develop problems with walking, weakness, numbness, or using your arms, hands, or legs.  You have difficulty speaking.  You develop severe headaches.  Your nausea or vomiting continues or gets worse.  You develop visual changes.  Your family or friends notice any behavioral changes.  Your condition gets worse.  You have a fever.  You develop a stiff neck or sensitivity to light. MAKE SURE YOU:   Understand these instructions.  Will watch your condition.  Will get help right away if you are not doing well or get worse. Document Released: 08/19/2006 Document Revised: 02/03/2012 Document Reviewed: 08/01/2011 [facility] Patient Information 2015 [place], [place]. This information is not intended to replace advice given to you by your health care provider. Make sure you discuss any questions you have with your health care provider.

## 2015-08-08 NOTE — ED Notes (Signed)
Pt here with report of "feeling dizzy"   Vomiting upon transit here  Symptoms began yesterday evening while she was at work and she has felt it three times since   denies syncope denies pain

## 2015-08-08 NOTE — ED Provider Notes (Signed)
-----------------------------------------   3:51 PM on 08/08/2015 -----------------------------------------   Blood pressure 158/96, pulse 65, temperature 97.4 F (36.3 C), temperature source Oral, resp. rate 11, height 5\' 6"  (1.676 m), weight 177 lb (80.287 kg), SpO2 97 %.  Assuming care from Dr. Cinda Quest.  In short, Holly Green is a 69 y.o. female with a chief complaint of Dizziness .  Refer to the original H&P for additional details.  The current plan of care is to follow up her MRI results and disposition appropriately.  The diagnosis will likely be CVA versus vertigo.  ----------------------------------------- 7:10 PM on 08/08/2015 -----------------------------------------  I reassessed the patient and she states that she does feel better.  Her MRI was negative for any acute CVA.  I updated her and her daughter and suggested that we try meclizine and close outpatient follow-up.  She understands and agrees with plan.  Hinda Kehr, MD 08/08/15 2001

## 2015-08-08 NOTE — ED Notes (Signed)
Patient transported to MRI 

## 2015-08-08 NOTE — ED Provider Notes (Signed)
[facility] Emergency Department Provider Note  ____________________________________________  Time seen: Approximately 2:51 PM  I have reviewed the triage vital signs and the nursing notes.   HISTORY  Chief Complaint Dizziness    HPI Holly Green is a 69 y.o. female patient reports she had 2 episodes of difficulty keeping her balance yesterday she reports she really hadn't any spinning sensation just felt "foolish." Today patient had a couple more episodes. Then began became nauseated and began to vomit. Patient reports she's never had this before yesterday. Patient denies any tinnitus. Patient had no nausea or vomiting until just before coming here. Patient describes the episodes as being unsteady and unable to walk right. She also said symptoms sometimes come on if she moved her head or rolled over in bed.   Past Medical History  Diagnosis Date  . Hyperlipidemia   . Hypertension   . GERD (gastroesophageal reflux disease)   . Osteoporosis     There are no active problems to display for this patient.   Past Surgical History  Procedure Laterality Date  . Abdominal hysterectomy      Current Outpatient Rx  Name  Route  Sig  Dispense  Refill  . alendronate (FOSAMAX) 70 MG tablet   Oral   Take 70 mg by mouth once a week.          . bisoprolol-hydrochlorothiazide (ZIAC) 10-6.25 MG per tablet   Oral   Take 1 tablet by mouth daily.   30 tablet   2   . lovastatin (MEVACOR) 20 MG tablet   Oral   Take 1 tablet (20 mg total) by mouth at bedtime.   30 tablet   3   . omeprazole (PRILOSEC) 20 MG capsule   Oral   Take 20 mg by mouth daily.           Allergies Review of patient's allergies indicates no known allergies.  Family History  Problem Relation Age of Onset  . Hypertension Brother   . Heart disease Brother     Social History Social History  Substance Use Topics  . Smoking status: Never Smoker   . Smokeless tobacco: Not on  file  . Alcohol Use: No    Review of Systems Constitutional: No fever/chills Eyes: No visual changes. ENT: No sore throat. Cardiovascular: Denies chest pain. Respiratory: Denies shortness of breath. Gastrointestinal: No abdominal pain.  No diarrhea.  No constipation. Genitourinary: Negative for dysuria. Musculoskeletal: Negative for back pain. Skin: Negative for rash. Neurological: Negative for headaches, focal weakness or numbness.  10-point ROS otherwise negative.  ____________________________________________   PHYSICAL EXAM:  VITAL SIGNS: ED Triage Vitals  Enc Vitals Group     BP 08/08/15 0838 162/87 mmHg     Pulse Rate 08/08/15 0838 71     Resp 08/08/15 0838 18     Temp 08/08/15 0838 97.4 F (36.3 C)     Temp Source 08/08/15 0838 Oral     SpO2 08/08/15 0838 99 %     Weight 08/08/15 0838 177 lb (80.287 kg)     Height 08/08/15 0838 5\' 6"  (1.676 m)     Head Cir --      Peak Flow --      Pain Score --      Pain Loc --      Pain Edu? --      Excl. in [place]? --     Constitutional: Alert and oriented. Well appearing and in no acute distress. Eyes:  Conjunctivae are normal. PERRL. EOMI. I am unable to see the patient's fundi. I do not see any nystatin this. Head: Atraumatic. Nose: No congestion/rhinnorhea. Mouth/Throat: Mucous membranes are moist.  Oropharynx non-erythematous. Neck: No stridor.  Cardiovascular: Normal rate, regular rhythm. Grossly normal heart sounds.  Good peripheral circulation. Respiratory: Normal respiratory effort.  No retractions. Lungs CTAB. Gastrointestinal: Soft and nontender. No distention. No abdominal bruits. No CVA tenderness. Musculoskeletal: No lower extremity tenderness nor edema.  No joint effusions. Neurologic:  Normal speech and language. No gross focal neurologic deficits are appreciated. Cranial nerves II through XII are intact. Cerebellar finger-nose rapid alternating movements and hands are normal. Motor strength is 5 over 5  throughout. Sensation is intact throughout. No gait instability when patient walks to the bathroom.. Skin:  Skin is warm, dry and intact. No rash noted. Psychiatric: Mood and affect are normal. Speech and behavior are normal.  ____________________________________________   LABS (all labs ordered are listed, but only abnormal results are displayed)  Labs Reviewed  BASIC METABOLIC PANEL - Abnormal; Notable for the following:    Potassium 3.0 (*)    Glucose, Bld 131 (*)    All other components within normal limits  CBC - Abnormal; Notable for the following:    Hemoglobin 10.6 (*)    HCT 33.1 (*)    RDW 15.0 (*)    All other components within normal limits  URINALYSIS COMPLETEWITH MICROSCOPIC ([facility] ONLY) - Abnormal; Notable for the following:    Color, Urine STRAW (*)    APPearance CLEAR (*)    Glucose, UA 50 (*)    All other components within normal limits  CBG MONITORING, ED   ____________________________________________  EKG  EKG read and interpreted by me shows normal sinus rhythm at a rate of 68 left axis nonspecific ST-T wave changes ____________________________________________  RADIOLOGY   ____________________________________________   PROCEDURES   I am unable to induce any nystagmus. Head thrust testing is normal. ____________________________________________   INITIAL IMPRESSION / ASSESSMENT AND PLAN / ED COURSE  Pertinent labs & imaging results that were available during my care of the patient were reviewed by me and considered in my medical decision making (see chart for details).  MRI pending we'll sign out to Dr. for about ____________________________________________   FINAL CLINICAL IMPRESSION(S) / ED DIAGNOSES  Final diagnoses:  Dizziness      Nena Polio, MD 08/08/15 [phone]

## 2015-08-11 ENCOUNTER — Ambulatory Visit (INDEPENDENT_AMBULATORY_CARE_PROVIDER_SITE_OTHER): Payer: Medicare Other | Admitting: Family Medicine

## 2015-08-11 ENCOUNTER — Encounter: Payer: Self-pay | Admitting: Family Medicine

## 2015-08-11 VITALS — BP 124/80 | HR 86 | Temp 98.1°F | Resp 16 | Ht 64.0 in | Wt 179.8 lb

## 2015-08-11 DIAGNOSIS — I10 Essential (primary) hypertension: Secondary | ICD-10-CM | POA: Diagnosis not present

## 2015-08-11 DIAGNOSIS — H8111 Benign paroxysmal vertigo, right ear: Secondary | ICD-10-CM | POA: Diagnosis not present

## 2015-08-11 DIAGNOSIS — R42 Dizziness and giddiness: Secondary | ICD-10-CM

## 2015-08-11 NOTE — Progress Notes (Signed)
Name: Holly Green   MRN: [id number]    DOB: [birth year]   Date:08/11/2015       Progress Note  Subjective  Chief Complaint  Chief Complaint  Patient presents with  . Dizziness    ER follow up    Dizziness Associated symptoms include nausea and vomiting. Pertinent negatives include no chest pain, chills, congestion, coughing, fever, headaches, myalgias, neck pain, sore throat or weakness.   Patient complains of new onset of dizziness about 4 days ago. The onset was sudden. She was evaluated emergency department with extensive workup including a CT scan as well as laboratory work which were unremarkable. She had benign positional vertigo. Is been less frequent since she's been home meclizine. There is some mild change in her hearing in the right ear. There was some nausea with vomiting with the initial episode of presentation to the emergency department but has improved. It is sometimes aggravated by certain movements. She is started on meclizine as noted above with improvement.  Hypertension   Patient presents for follow-up of hypertension. It has been present for over 5 years.  Patient states that there is compliance with medical regimen which consists of Ziac . There is no end organ disease. Cardiac risk factors include hypertension hyperlipidemia and diabetes.  Exercise regimen consist of minimal .  Diet consist of some salt restriction .   Past Medical History  Diagnosis Date  . Hyperlipidemia   . Hypertension   . GERD (gastroesophageal reflux disease)   . Osteoporosis     Social History  Substance Use Topics  . Smoking status: Never Smoker   . Smokeless tobacco: Not on file  . Alcohol Use: No     Current outpatient prescriptions:  .  alendronate (FOSAMAX) 70 MG tablet, Take 70 mg by mouth once a week. , Disp: , Rfl:  .  bisoprolol-hydrochlorothiazide (ZIAC) 10-6.25 MG per tablet, Take 1 tablet by mouth daily., Disp: 30 tablet, Rfl: 2 .  lovastatin (MEVACOR) 20 MG tablet,  Take 1 tablet (20 mg total) by mouth at bedtime., Disp: 30 tablet, Rfl: 3 .  meclizine (ANTIVERT) 32 MG tablet, Take 1 tablet (32 mg total) by mouth 3 (three) times daily as needed., Disp: 15 tablet, Rfl: 0 .  omeprazole (PRILOSEC) 20 MG capsule, Take 20 mg by mouth daily., Disp: , Rfl:   No Known Allergies  Review of Systems  Constitutional: Negative for fever, chills and weight loss.  HENT: Positive for tinnitus. Negative for congestion, hearing loss and sore throat.   Eyes: Negative for blurred vision, double vision and redness.  Respiratory: Negative for cough, hemoptysis and shortness of breath.   Cardiovascular: Negative for chest pain, palpitations, orthopnea, claudication and leg swelling.  Gastrointestinal: Positive for nausea and vomiting. Negative for heartburn, diarrhea, constipation and blood in stool.  Genitourinary: Negative for dysuria, urgency, frequency and hematuria.  Musculoskeletal: Negative for myalgias, back pain, joint pain, falls and neck pain.  Skin: Negative for itching.  Neurological: Positive for dizziness. Negative for tingling, tremors, focal weakness, seizures, loss of consciousness, weakness and headaches.  Endo/Heme/Allergies: Does not bruise/bleed easily.  Psychiatric/Behavioral: Negative for depression and substance abuse. The patient is not nervous/anxious and does not have insomnia.      Objective  Filed Vitals:   08/11/15 0759  BP: 124/80  Pulse: 86  Temp: 98.1 F (36.7 C)  TempSrc: Oral  Resp: 16  Height: 5\' 4"  (1.626 m)  Weight: 179 lb 12.8 oz (81.557 kg)  SpO2: 96%  Physical Exam  Constitutional: She is oriented to person, place, and time and well-developed, well-nourished, and in no distress.  HENT:  Head: Normocephalic.  Eyes: EOM are normal. Pupils are equal, round, and reactive to light.  Neck: Normal range of motion. No thyromegaly present.  Cardiovascular: Normal rate, regular rhythm and normal heart sounds.   No murmur  heard. Pulmonary/Chest: Effort normal and breath sounds normal.  Musculoskeletal: Normal range of motion. She exhibits no edema.  Neurological: She is alert and oriented to person, place, and time. She displays normal reflexes. No cranial nerve deficit. She exhibits normal muscle tone. Gait normal. Coordination normal.  Skin: Skin is warm and dry. No rash noted.  Psychiatric: Memory, affect and judgment normal.      Assessment & Plan   1. Dizziness Probably secondary to #2  2. Benign positional vertigo, right Force fluids and continue meclizine which she got from the emergency department  3. Essential hypertension Well-controlled

## 2015-10-03 ENCOUNTER — Ambulatory Visit: Payer: Medicare Other | Admitting: Family Medicine

## 2015-10-12 ENCOUNTER — Encounter: Payer: Self-pay | Admitting: Family Medicine

## 2015-10-12 ENCOUNTER — Ambulatory Visit (INDEPENDENT_AMBULATORY_CARE_PROVIDER_SITE_OTHER): Payer: Medicare Other | Admitting: Family Medicine

## 2015-10-12 VITALS — BP 128/86 | HR 76 | Temp 98.0°F | Resp 16 | Ht 64.0 in | Wt 177.1 lb

## 2015-10-12 DIAGNOSIS — R739 Hyperglycemia, unspecified: Secondary | ICD-10-CM | POA: Diagnosis not present

## 2015-10-12 DIAGNOSIS — K5730 Diverticulosis of large intestine without perforation or abscess without bleeding: Secondary | ICD-10-CM | POA: Diagnosis not present

## 2015-10-12 DIAGNOSIS — E785 Hyperlipidemia, unspecified: Secondary | ICD-10-CM | POA: Diagnosis not present

## 2015-10-12 DIAGNOSIS — R350 Frequency of micturition: Secondary | ICD-10-CM | POA: Diagnosis not present

## 2015-10-12 DIAGNOSIS — I10 Essential (primary) hypertension: Secondary | ICD-10-CM

## 2015-10-12 LAB — GLUCOSE, POCT (MANUAL RESULT ENTRY): POC Glucose: 103 mg/dl — AB (ref 70–99)

## 2015-10-12 LAB — POCT GLYCOSYLATED HEMOGLOBIN (HGB A1C): Hemoglobin A1C: 5.9

## 2015-10-12 LAB — POCT URINALYSIS DIPSTICK
Bilirubin, UA: NEGATIVE
Blood, UA: NEGATIVE
Glucose, UA: NEGATIVE
Ketones, UA: NEGATIVE
Leukocytes, UA: NEGATIVE
Nitrite, UA: NEGATIVE
Protein, UA: NEGATIVE
Spec Grav, UA: 1.02
Urobilinogen, UA: NEGATIVE
pH, UA: 7.5

## 2015-10-12 MED ORDER — OMEPRAZOLE 20 MG PO CPDR: 20.0000 mg | DELAYED_RELEASE_CAPSULE | Freq: Every day | ORAL | Status: DC

## 2015-10-12 MED ORDER — BISOPROLOL-HYDROCHLOROTHIAZIDE 10-6.25 MG PO TABS: 1.0000 | ORAL_TABLET | Freq: Every day | ORAL | Status: DC

## 2015-10-12 MED ORDER — ALENDRONATE SODIUM 70 MG PO TABS: 70.0000 mg | ORAL_TABLET | ORAL | Status: DC

## 2015-10-12 MED ORDER — TOLTERODINE TARTRATE ER 2 MG PO CP24: 2.0000 mg | ORAL_CAPSULE | Freq: Every day | ORAL | Status: DC

## 2015-10-12 MED ORDER — LOVASTATIN 20 MG PO TABS: 20.0000 mg | ORAL_TABLET | Freq: Every day | ORAL | Status: DC

## 2015-10-12 NOTE — Progress Notes (Signed)
Name: Holly Green   MRN: [id number]    DOB: January 28, 1946   Date:10/12/2015       Progress Note  Subjective  Chief Complaint  Chief Complaint  Patient presents with  . Hypertension  . Hyperlipidemia  . Urinary Frequency    HPI  Hypertension   Patient presents for follow-up of hypertension. It has been present for over 5 Ziac 10-6 0.25 once daily years.  Patient states that there is compliance with medical regimen which consists of Ziac 10-0 6.25 once daily . There is no end organ disease. Cardiac risk factors include hypertension hyperlipidemia and diabetes.  Exercise regimen consist of limited walking .  Diet consist of salt restriction  Hyperlipidemia  Patient has a history of hyperlipidemia for over 5 years.  Current medical regimen consist of lovastatin 20 mg daily at bedtime .  Compliance is good .  Diet and exercise are currently followed usually .  Risk factors for cardiovascular disease include hyperlipidemia hypertension and advanced age .   There have been no side effects from the medication.   .  Urinary frequency  Patient has been expressing urinary frequency for several weeks. There is no hematuria. There is no incontinence. Has been no fever or chills  Hyperglycemia  Patient has a history of elevated glucose. She also is experiencing urinary frequency. There is been no significant weight reduction. She is not experiencing nocturia. There's been no polydipsia or polyphagia..  Past Medical History  Diagnosis Date  . Hyperlipidemia   . Hypertension   . GERD (gastroesophageal reflux disease)   . Osteoporosis     Social History  Substance Use Topics  . Smoking status: Never Smoker   . Smokeless tobacco: Not on file  . Alcohol Use: No     Current outpatient prescriptions:  .  alendronate (FOSAMAX) 70 MG tablet, Take 1 tablet (70 mg total) by mouth once a week., Disp: 90 tablet, Rfl: 1 .  bisoprolol-hydrochlorothiazide (ZIAC) 10-6.25 MG tablet, Take 1 tablet by  mouth daily., Disp: 90 tablet, Rfl: 1 .  lovastatin (MEVACOR) 20 MG tablet, Take 1 tablet (20 mg total) by mouth at bedtime., Disp: 90 tablet, Rfl: 1 .  meclizine (ANTIVERT) 32 MG tablet, Take 1 tablet (32 mg total) by mouth 3 (three) times daily as needed., Disp: 15 tablet, Rfl: 0 .  omeprazole (PRILOSEC) 20 MG capsule, Take 1 capsule (20 mg total) by mouth daily., Disp: 90 capsule, Rfl: 1  No Known Allergies  Review of Systems  Constitutional: Negative for fever, chills and weight loss.  HENT: Negative for congestion, hearing loss, sore throat and tinnitus.   Eyes: Negative for blurred vision, double vision and redness.  Respiratory: Negative for cough, hemoptysis and shortness of breath.   Cardiovascular: Negative for chest pain, palpitations, orthopnea, claudication and leg swelling.  Gastrointestinal: Positive for abdominal pain. Negative for heartburn, nausea, vomiting, diarrhea, constipation and blood in stool.  Genitourinary: Positive for frequency. Negative for dysuria, urgency and hematuria.  Musculoskeletal: Positive for joint pain. Negative for myalgias, back pain, falls and neck pain.  Skin: Negative for itching.  Neurological: Negative for dizziness, tingling, tremors, focal weakness, seizures, loss of consciousness, weakness and headaches.  Endo/Heme/Allergies: Does not bruise/bleed easily.  Psychiatric/Behavioral: Negative for depression and substance abuse. The patient is not nervous/anxious and does not have insomnia.      Objective  Filed Vitals:   10/12/15 1055  BP: 128/86  Pulse: 76  Temp: 98 F (36.7 C)  Resp: 16  Height: 5\' 4"  (1.626 m)  Weight: 177 lb 1 oz (80.315 kg)  SpO2: 98%     Physical Exam  Constitutional: She is oriented to person, place, and time and well-developed, well-nourished, and in no distress.  HENT:  Head: Normocephalic.  Eyes: EOM are normal. Pupils are equal, round, and reactive to light.  Neck: Normal range of motion. No  thyromegaly present.  Cardiovascular: Normal rate, regular rhythm and normal heart sounds.   No murmur heard. Pulmonary/Chest: Effort normal and breath sounds normal.  Abdominal: Soft. Bowel sounds are normal. She exhibits no distension. There is no tenderness.  Musculoskeletal: Normal range of motion. She exhibits no edema.  Neurological: She is alert and oriented to person, place, and time. No cranial nerve deficit. Gait normal.  Skin: Skin is warm and dry. No rash noted.  Psychiatric: Memory and affect normal.      Assessment & Plan  1. Urinary frequency Probable hyperactive bladder versus urethral stenosis. Below is normal and she continues her symptoms refer to urologist - POCT Urinalysis Dipstick - CULTURE, URINE COMPREHENSIVE  2. Hyperglycemia Rule out diabetes - Comprehensive Metabolic Panel (CMET) - POCT Glucose (CBG) - POCT HgB A1C  3. Hyperlipidemia Labs as below - Lipid Profile - TSH  4.  Hypertension  5.  Diverticulosis

## 2015-10-13 LAB — COMPREHENSIVE METABOLIC PANEL
ALT: 15 IU/L (ref 0–32)
AST: 24 IU/L (ref 0–40)
Albumin/Globulin Ratio: 1.6 (ref 1.1–2.5)
Albumin: 4.2 g/dL (ref 3.6–4.8)
Alkaline Phosphatase: 88 IU/L (ref 39–117)
BUN/Creatinine Ratio: 21 (ref 11–26)
BUN: 16 mg/dL (ref 8–27)
Bilirubin Total: 0.9 mg/dL (ref 0.0–1.2)
CO2: 24 mmol/L (ref 18–29)
Calcium: 9.3 mg/dL (ref 8.7–10.3)
Chloride: 102 mmol/L (ref 97–106)
Creatinine, Ser: 0.77 mg/dL (ref 0.57–1.00)
GFR calc Af Amer: 92 mL/min/{1.73_m2} (ref >59)
GFR calc non Af Amer: 80 mL/min/{1.73_m2} (ref >59)
Globulin, Total: 2.7 g/dL (ref 1.5–4.5)
Glucose: 93 mg/dL (ref 65–99)
Potassium: 4.1 mmol/L (ref 3.5–5.2)
Sodium: 143 mmol/L (ref 136–144)
Total Protein: 6.9 g/dL (ref 6.0–8.5)

## 2015-10-13 LAB — LIPID PANEL
Chol/HDL Ratio: 1.9 ratio units (ref 0.0–4.4)
Cholesterol, Total: 172 mg/dL (ref 100–199)
HDL: 89 mg/dL (ref >39)
LDL Calculated: 75 mg/dL (ref 0–99)
Triglycerides: 42 mg/dL (ref 0–149)
VLDL Cholesterol Cal: 8 mg/dL (ref 5–40)

## 2015-10-13 LAB — TSH: TSH: 1.77 u[IU]/mL (ref 0.450–4.500)

## 2015-10-14 LAB — CULTURE, URINE COMPREHENSIVE

## 2015-10-15 DIAGNOSIS — I10 Essential (primary) hypertension: Secondary | ICD-10-CM | POA: Insufficient documentation

## 2015-10-15 DIAGNOSIS — R7303 Prediabetes: Secondary | ICD-10-CM | POA: Insufficient documentation

## 2015-10-15 DIAGNOSIS — K5730 Diverticulosis of large intestine without perforation or abscess without bleeding: Secondary | ICD-10-CM | POA: Insufficient documentation

## 2015-10-15 DIAGNOSIS — E785 Hyperlipidemia, unspecified: Secondary | ICD-10-CM | POA: Insufficient documentation

## 2015-10-15 HISTORY — DX: Prediabetes: R73.03

## 2016-02-12 ENCOUNTER — Ambulatory Visit: Payer: Medicare Other | Admitting: Family Medicine

## 2016-03-25 ENCOUNTER — Ambulatory Visit: Payer: Medicare Other | Admitting: Family Medicine

## 2016-03-28 ENCOUNTER — Encounter: Payer: Self-pay | Admitting: Family Medicine

## 2016-03-28 ENCOUNTER — Ambulatory Visit (INDEPENDENT_AMBULATORY_CARE_PROVIDER_SITE_OTHER): Payer: Medicare Other | Admitting: Family Medicine

## 2016-03-28 VITALS — BP 124/82 | HR 71 | Temp 97.7°F | Resp 18 | Ht 64.0 in | Wt 173.1 lb

## 2016-03-28 DIAGNOSIS — N949 Unspecified condition associated with female genital organs and menstrual cycle: Secondary | ICD-10-CM

## 2016-03-28 DIAGNOSIS — R102 Pelvic and perineal pain: Secondary | ICD-10-CM

## 2016-03-28 NOTE — Progress Notes (Signed)
Name: Holly Green   MRN: [id number]    DOB: Jan 18, 1946   Date:03/28/2016       Progress Note  Subjective  Chief Complaint  Chief Complaint  Patient presents with  . Abdominal Pain    lower    Abdominal Pain This is a new problem. The current episode started in the past 7 days (over 1 week). The pain is located in the LLQ. Pertinent negatives include no constipation, diarrhea, dysuria, fever, flatus, frequency, nausea, vomiting or weight loss. Associated symptoms comments: Pain worse with cough and walking. The pain is aggravated by coughing. Treatments tried: Applied a cream to the affected area.    Past Medical History  Diagnosis Date  . Hyperlipidemia   . Hypertension   . GERD (gastroesophageal reflux disease)   . Osteoporosis     Past Surgical History  Procedure Laterality Date  . Abdominal hysterectomy      Family History  Problem Relation Age of Onset  . Hypertension Brother   . Heart disease Brother     Social History   Social History  . Marital Status: Single    Spouse Name: N/A  . Number of Children: N/A  . Years of Education: N/A   Occupational History  . Not on file.   Social History Main Topics  . Smoking status: Never Smoker   . Smokeless tobacco: Not on file  . Alcohol Use: No  . Drug Use: No  . Sexual Activity: Not on file   Other Topics Concern  . Not on file   Social History Narrative     Current outpatient prescriptions:  .  alendronate (FOSAMAX) 70 MG tablet, Take 1 tablet (70 mg total) by mouth once a week., Disp: 12 tablet, Rfl: 1 .  bisoprolol-hydrochlorothiazide (ZIAC) 10-6.25 MG tablet, Take 1 tablet by mouth daily., Disp: 90 tablet, Rfl: 1 .  lovastatin (MEVACOR) 20 MG tablet, Take 1 tablet (20 mg total) by mouth at bedtime., Disp: 90 tablet, Rfl: 1 .  meclizine (ANTIVERT) 32 MG tablet, Take 1 tablet (32 mg total) by mouth 3 (three) times daily as needed. (Patient not taking: Reported on 10/12/2015), Disp: 15 tablet, Rfl: 0 .   omeprazole (PRILOSEC) 20 MG capsule, Take 1 capsule (20 mg total) by mouth daily., Disp: 90 capsule, Rfl: 1 .  tolterodine (DETROL LA) 2 MG 24 hr capsule, Take 1 capsule (2 mg total) by mouth daily., Disp: 30 capsule, Rfl: 1  No Known Allergies   Review of Systems  Constitutional: Negative for fever, chills, weight loss and malaise/fatigue.  Gastrointestinal: Positive for abdominal pain. Negative for nausea, vomiting, diarrhea, constipation, blood in stool and flatus.  Genitourinary: Negative for dysuria, urgency and frequency.     Objective  Filed Vitals:   03/28/16 1101  BP: 124/82  Pulse: 71  Temp: 97.7 F (36.5 C)  Resp: 18  Height: 5\' 4"  (1.626 m)  Weight: 173 lb 2 oz (78.529 kg)  SpO2: 98%    Physical Exam  Abdominal: There is tenderness in the suprapubic area.    Area of intense tenderness to palpation in the lower central pelvis, feels heavy and stiff to touch, no overlying skin changes.  Nursing note and vitals reviewed.    Assessment & Plan  1. Acute pelvic pain, female Obtain pelvic ultrasound, lab work and urine studies to rule out probable etiologies. If inconclusive, will need a CT scan abdomen and pelvis. - CBC with Differential - Comprehensive Metabolic Panel (CMET) - Urinalysis, Routine w  reflex microscopic - US Transvaginal Non-OB; Future  Brandye Inthavong Asad A. [facility] [place] [facility] 03/28/2016 11:37 AM

## 2016-03-29 ENCOUNTER — Other Ambulatory Visit: Payer: Self-pay | Admitting: Family Medicine

## 2016-03-29 ENCOUNTER — Telehealth: Payer: Self-pay | Admitting: Family Medicine

## 2016-03-29 DIAGNOSIS — R102 Pelvic and perineal pain: Secondary | ICD-10-CM

## 2016-03-29 NOTE — Telephone Encounter (Signed)
Patent notified of appointment

## 2016-03-29 NOTE — Telephone Encounter (Signed)
Was seen yesterday by Dr Manuella Ghazi and have not heard anything for CT appointment. Please return call

## 2016-04-01 ENCOUNTER — Ambulatory Visit
Admission: RE | Admit: 2016-04-01 | Discharge: 2016-04-01 | Disposition: A | Discharge status: Home / Self Care | Payer: Medicare Other | Source: Ambulatory Visit | Attending: Family Medicine | Admitting: Family Medicine

## 2016-04-01 DIAGNOSIS — R102 Pelvic and perineal pain: Secondary | ICD-10-CM | POA: Diagnosis not present

## 2016-04-01 DIAGNOSIS — Z90710 Acquired absence of both cervix and uterus: Secondary | ICD-10-CM | POA: Diagnosis not present

## 2016-04-04 ENCOUNTER — Telehealth: Payer: Self-pay | Admitting: Family Medicine

## 2016-04-04 NOTE — Telephone Encounter (Signed)
Pt said that she needs results of the ct scan you ordered for her. Can leave a message if does not pick up

## 2016-04-05 NOTE — Telephone Encounter (Signed)
CT results have been reported to patient

## 2016-04-08 ENCOUNTER — Telehealth: Payer: Self-pay | Admitting: Family Medicine

## 2016-04-08 ENCOUNTER — Other Ambulatory Visit: Payer: Self-pay

## 2016-04-08 DIAGNOSIS — N949 Unspecified condition associated with female genital organs and menstrual cycle: Secondary | ICD-10-CM | POA: Diagnosis not present

## 2016-04-08 MED ORDER — BISOPROLOL-HYDROCHLOROTHIAZIDE 10-6.25 MG PO TABS: 1.0000 | ORAL_TABLET | Freq: Every day | ORAL | Status: DC

## 2016-04-08 NOTE — Telephone Encounter (Signed)
Sent a 30 day supply to her pharmacy which will last her until her appointment

## 2016-04-08 NOTE — Telephone Encounter (Signed)
Patient has scheduled appointment with Dr Manuella Ghazi on 05-07-16 for medication refill. Requesting refill on Bisoprolol. Please send to [facility] [address]

## 2016-04-08 NOTE — Telephone Encounter (Signed)
Informed patient through voicemail

## 2016-04-09 LAB — CBC WITH DIFFERENTIAL/PLATELET
Basophils Absolute: 0 10*3/uL (ref 0.0–0.2)
Basos: 0 %
EOS (ABSOLUTE): 0.1 10*3/uL (ref 0.0–0.4)
Eos: 2 %
Hematocrit: 34.9 % (ref 34.0–46.6)
Hemoglobin: 11.3 g/dL (ref 11.1–15.9)
Immature Grans (Abs): 0 10*3/uL (ref 0.0–0.1)
Immature Granulocytes: 0 %
Lymphocytes Absolute: 2 10*3/uL (ref 0.7–3.1)
Lymphs: 45 %
MCH: 27.6 pg (ref 26.6–33.0)
MCHC: 32.4 g/dL (ref 31.5–35.7)
MCV: 85 fL (ref 79–97)
Monocytes Absolute: 0.4 10*3/uL (ref 0.1–0.9)
Monocytes: 8 %
Neutrophils Absolute: 2 10*3/uL (ref 1.4–7.0)
Neutrophils: 45 %
Platelets: 231 10*3/uL (ref 150–379)
RBC: 4.09 x10E6/uL (ref 3.77–5.28)
RDW: 14.9 % (ref 12.3–15.4)
WBC: 4.5 10*3/uL (ref 3.4–10.8)

## 2016-04-09 LAB — COMPREHENSIVE METABOLIC PANEL
ALT: 14 IU/L (ref 0–32)
AST: 20 IU/L (ref 0–40)
Albumin/Globulin Ratio: 1.4 (ref 1.2–2.2)
Albumin: 3.9 g/dL (ref 3.6–4.8)
Alkaline Phosphatase: 89 IU/L (ref 39–117)
BUN/Creatinine Ratio: 27 (ref 12–28)
BUN: 20 mg/dL (ref 8–27)
Bilirubin Total: 0.6 mg/dL (ref 0.0–1.2)
CO2: 25 mmol/L (ref 18–29)
Calcium: 9.1 mg/dL (ref 8.7–10.3)
Chloride: 100 mmol/L (ref 96–106)
Creatinine, Ser: 0.74 mg/dL (ref 0.57–1.00)
GFR calc Af Amer: 96 mL/min/{1.73_m2} (ref >59)
GFR calc non Af Amer: 84 mL/min/{1.73_m2} (ref >59)
Globulin, Total: 2.7 g/dL (ref 1.5–4.5)
Glucose: 87 mg/dL (ref 65–99)
Potassium: 3.7 mmol/L (ref 3.5–5.2)
Sodium: 140 mmol/L (ref 134–144)
Total Protein: 6.6 g/dL (ref 6.0–8.5)

## 2016-04-09 LAB — URINALYSIS, ROUTINE W REFLEX MICROSCOPIC
Bilirubin, UA: NEGATIVE
Glucose, UA: NEGATIVE
Ketones, UA: NEGATIVE
Leukocytes, UA: NEGATIVE
Nitrite, UA: NEGATIVE
Protein, UA: NEGATIVE
RBC, UA: NEGATIVE
Specific Gravity, UA: 1.006 (ref 1.005–1.030)
Urobilinogen, Ur: 0.2 mg/dL (ref 0.2–1.0)
pH, UA: 6.5 (ref 5.0–7.5)

## 2016-05-07 ENCOUNTER — Encounter: Payer: Self-pay | Admitting: Family Medicine

## 2016-05-07 ENCOUNTER — Ambulatory Visit (INDEPENDENT_AMBULATORY_CARE_PROVIDER_SITE_OTHER): Payer: Medicare Other | Admitting: Family Medicine

## 2016-05-07 VITALS — BP 120/81 | HR 67 | Temp 98.1°F | Resp 16 | Ht 64.0 in | Wt 174.9 lb

## 2016-05-07 DIAGNOSIS — R011 Cardiac murmur, unspecified: Secondary | ICD-10-CM

## 2016-05-07 DIAGNOSIS — I10 Essential (primary) hypertension: Secondary | ICD-10-CM

## 2016-05-07 DIAGNOSIS — E785 Hyperlipidemia, unspecified: Secondary | ICD-10-CM

## 2016-05-07 DIAGNOSIS — M810 Age-related osteoporosis without current pathological fracture: Secondary | ICD-10-CM | POA: Diagnosis not present

## 2016-05-07 HISTORY — DX: Cardiac murmur, unspecified: R01.1

## 2016-05-07 MED ORDER — LOVASTATIN 20 MG PO TABS: 20.0000 mg | ORAL_TABLET | Freq: Every day | ORAL | Status: DC

## 2016-05-07 MED ORDER — ALENDRONATE SODIUM 70 MG PO TABS: 70.0000 mg | ORAL_TABLET | ORAL | Status: DC

## 2016-05-07 MED ORDER — BISOPROLOL-HYDROCHLOROTHIAZIDE 10-6.25 MG PO TABS: 1.0000 | ORAL_TABLET | Freq: Every day | ORAL | Status: DC

## 2016-05-07 NOTE — Progress Notes (Signed)
Name: Holly Green   MRN: [id number]    DOB: August 17, 1946   Date:05/07/2016       Progress Note  Subjective  Chief Complaint  Chief Complaint  Patient presents with  . Medication Refill    fosamax 70 mg / lovastatin 20 mg / bisoprolol     Hypertension This is a chronic problem. The problem is unchanged. The problem is controlled. Pertinent negatives include no blurred vision, chest pain, headaches, palpitations or shortness of breath. Past treatments include beta blockers and diuretics. There is no history of kidney disease, CAD/MI or CVA.  Hyperlipidemia This is a chronic problem. The problem is controlled. Recent lipid tests were reviewed and are normal. Associated symptoms include myalgias (does have cramps in lower extremities.). Pertinent negatives include no chest pain, leg pain or shortness of breath. Current antihyperlipidemic treatment includes statins.    Osteoporosis: Pt. Has history of Osteoporosis in the L-spine, BMD measurement -3.2 in July 2016. She is on Fosamax 70 mg weekly plus calcium and vitamin D. She does report cramps in her lower legs and wondering if her calcium is elevated. She also has back pain due to arthritis. Taking Fosamax without any side effects  Past Medical History  Diagnosis Date  . Hyperlipidemia   . Hypertension   . GERD (gastroesophageal reflux disease)   . Osteoporosis     Past Surgical History  Procedure Laterality Date  . Abdominal hysterectomy      Family History  Problem Relation Age of Onset  . Hypertension Brother   . Heart disease Brother     Social History   Social History  . Marital Status: Single    Spouse Name: N/A  . Number of Children: N/A  . Years of Education: N/A   Occupational History  . Not on file.   Social History Main Topics  . Smoking status: Never Smoker   . Smokeless tobacco: Not on file  . Alcohol Use: No  . Drug Use: No  . Sexual Activity: Not on file   Other Topics Concern  . Not on file    Social History Narrative     Current outpatient prescriptions:  .  alendronate (FOSAMAX) 70 MG tablet, Take 1 tablet (70 mg total) by mouth once a week., Disp: 12 tablet, Rfl: 1 .  bisoprolol-hydrochlorothiazide (ZIAC) 10-6.25 MG tablet, Take 1 tablet by mouth daily., Disp: 30 tablet, Rfl: 0 .  lovastatin (MEVACOR) 20 MG tablet, Take 1 tablet (20 mg total) by mouth at bedtime., Disp: 90 tablet, Rfl: 1 .  meclizine (ANTIVERT) 32 MG tablet, Take 1 tablet (32 mg total) by mouth 3 (three) times daily as needed., Disp: 15 tablet, Rfl: 0 .  omeprazole (PRILOSEC) 20 MG capsule, Take 1 capsule (20 mg total) by mouth daily., Disp: 90 capsule, Rfl: 1 .  tolterodine (DETROL LA) 2 MG 24 hr capsule, Take 1 capsule (2 mg total) by mouth daily., Disp: 30 capsule, Rfl: 1  No Known Allergies   Review of Systems  Constitutional: Negative for fever and chills.  Eyes: Negative for blurred vision.  Respiratory: Negative for shortness of breath.   Cardiovascular: Negative for chest pain and palpitations.  Musculoskeletal: Positive for myalgias (does have cramps in lower extremities.).  Neurological: Negative for headaches.    Objective  Filed Vitals:   05/07/16 0848  BP: 120/81  Pulse: 67  Temp: 98.1 F (36.7 C)  TempSrc: Oral  Resp: 16  Height: 5\' 4"  (1.626 m)  Weight: 174 lb 14.4  oz (79.334 kg)  SpO2: 98%    Physical Exam  Constitutional: She is well-developed, well-nourished, and in no distress.  HENT:  Head: Normocephalic and atraumatic.  Cardiovascular: Normal rate, regular rhythm, S1 normal and S2 normal.   Murmur heard.  Systolic murmur is present with a grade of 2/6  Pulmonary/Chest: Effort normal and breath sounds normal. She has no decreased breath sounds. She has no wheezes.  Abdominal: Soft. Bowel sounds are normal. There is no tenderness.  Psychiatric: Mood, memory, affect and judgment normal.  Nursing note and vitals reviewed.     Assessment & Plan  1. Essential  hypertension BP stable and controlled on present antihypertensive therapy - bisoprolol-hydrochlorothiazide (ZIAC) 10-6.25 MG tablet; Take 1 tablet by mouth daily.  Dispense: 90 tablet; Refill: 0  2. Hyperlipidemia FLP reviewed from November 2016. Repeat today. - Lipid Profile - Comprehensive Metabolic Panel (CMET) - lovastatin (MEVACOR) 20 MG tablet; Take 1 tablet (20 mg total) by mouth at bedtime.  Dispense: 90 tablet; Refill: 1  3. Osteoporosis We will repeat bone density scan, continue on Fosamax and also obtain vitamin D and calcium levels. - Comprehensive Metabolic Panel (CMET) - Vitamin D (25 hydroxy) - DG Bone Density; Future - alendronate (FOSAMAX) 70 MG tablet; Take 1 tablet (70 mg total) by mouth once a week.  Dispense: 52 tablet; Refill: 0  4. Cardiac murmur Undiagnosed, appears benign, patient is not reporting any cardiopulmonary symptoms. Consider echocardiogram if persistent.    Holly Green [facility] 05/07/2016 8:54 AM

## 2016-05-08 LAB — LIPID PANEL
Chol/HDL Ratio: 2 ratio units (ref 0.0–4.4)
Cholesterol, Total: 175 mg/dL (ref 100–199)
HDL: 86 mg/dL (ref >39)
LDL Calculated: 79 mg/dL (ref 0–99)
Triglycerides: 49 mg/dL (ref 0–149)
VLDL Cholesterol Cal: 10 mg/dL (ref 5–40)

## 2016-05-08 LAB — COMPREHENSIVE METABOLIC PANEL
ALT: 15 IU/L (ref 0–32)
AST: 20 IU/L (ref 0–40)
Albumin/Globulin Ratio: 1.5 (ref 1.2–2.2)
Albumin: 4.1 g/dL (ref 3.6–4.8)
Alkaline Phosphatase: 92 IU/L (ref 39–117)
BUN/Creatinine Ratio: 25 (ref 12–28)
BUN: 20 mg/dL (ref 8–27)
Bilirubin Total: 0.8 mg/dL (ref 0.0–1.2)
CO2: 24 mmol/L (ref 18–29)
Calcium: 9.1 mg/dL (ref 8.7–10.3)
Chloride: 100 mmol/L (ref 96–106)
Creatinine, Ser: 0.79 mg/dL (ref 0.57–1.00)
GFR calc Af Amer: 89 mL/min/{1.73_m2} (ref >59)
GFR calc non Af Amer: 77 mL/min/{1.73_m2} (ref >59)
Globulin, Total: 2.8 g/dL (ref 1.5–4.5)
Glucose: 90 mg/dL (ref 65–99)
Potassium: 3.8 mmol/L (ref 3.5–5.2)
Sodium: 140 mmol/L (ref 134–144)
Total Protein: 6.9 g/dL (ref 6.0–8.5)

## 2016-05-08 LAB — VITAMIN D 25 HYDROXY (VIT D DEFICIENCY, FRACTURES): Vit D, 25-Hydroxy: 36.1 ng/mL (ref 30.0–100.0)

## 2016-08-07 ENCOUNTER — Encounter: Payer: Self-pay | Admitting: Family Medicine

## 2016-08-07 ENCOUNTER — Ambulatory Visit (INDEPENDENT_AMBULATORY_CARE_PROVIDER_SITE_OTHER): Payer: Medicare Other | Admitting: Family Medicine

## 2016-08-07 DIAGNOSIS — I10 Essential (primary) hypertension: Secondary | ICD-10-CM | POA: Diagnosis not present

## 2016-08-07 DIAGNOSIS — K219 Gastro-esophageal reflux disease without esophagitis: Secondary | ICD-10-CM | POA: Insufficient documentation

## 2016-08-07 MED ORDER — BISOPROLOL-HYDROCHLOROTHIAZIDE 10-6.25 MG PO TABS: 1.0000 | ORAL_TABLET | Freq: Every day | ORAL | 1 refills | Status: DC

## 2016-08-07 MED ORDER — OMEPRAZOLE 20 MG PO CPDR: 20.0000 mg | DELAYED_RELEASE_CAPSULE | Freq: Every day | ORAL | 1 refills | Status: DC

## 2016-08-07 NOTE — Progress Notes (Signed)
Name: Holly Green   MRN: [id number]    DOB: 03-28-1946   Date:08/07/2016       Progress Note  Subjective  Chief Complaint  Chief Complaint  Patient presents with  . Follow-up    3 mo  . Medication Refill    Hypertension  This is a chronic problem. The problem is unchanged. Pertinent negatives include no blurred vision, chest pain, headaches, palpitations or shortness of breath. Past treatments include beta blockers and diuretics. There is no history of kidney disease, CAD/MI or CVA.  Gastroesophageal Reflux  She reports no abdominal pain, no chest pain, no heartburn or no sore throat. This is a chronic problem. She has tried a PPI for the symptoms. Past procedures do not include an EGD.    Past Medical History:  Diagnosis Date  . GERD (gastroesophageal reflux disease)   . Hyperlipidemia   . Hypertension   . Osteoporosis     Past Surgical History:  Procedure Laterality Date  . ABDOMINAL HYSTERECTOMY      Family History  Problem Relation Age of Onset  . Hypertension Brother   . Heart disease Brother     Social History   Social History  . Marital status: Single    Spouse name: N/A  . Number of children: N/A  . Years of education: N/A   Occupational History  . Not on file.   Social History Main Topics  . Smoking status: Never Smoker  . Smokeless tobacco: Never Used  . Alcohol use No  . Drug use: No  . Sexual activity: Not on file   Other Topics Concern  . Not on file   Social History Narrative  . No narrative on file     Current Outpatient Prescriptions:  .  alendronate (FOSAMAX) 70 MG tablet, Take 1 tablet (70 mg total) by mouth once a week., Disp: 52 tablet, Rfl: 0 .  bisoprolol-hydrochlorothiazide (ZIAC) 10-6.25 MG tablet, Take 1 tablet by mouth daily., Disp: 90 tablet, Rfl: 0 .  lovastatin (MEVACOR) 20 MG tablet, Take 1 tablet (20 mg total) by mouth at bedtime., Disp: 90 tablet, Rfl: 1 .  omeprazole (PRILOSEC) 20 MG capsule, Take 1 capsule (20 mg  total) by mouth daily., Disp: 90 capsule, Rfl: 1 .  meclizine (ANTIVERT) 32 MG tablet, Take 1 tablet (32 mg total) by mouth 3 (three) times daily as needed. (Patient not taking: Reported on 08/07/2016), Disp: 15 tablet, Rfl: 0 .  tolterodine (DETROL LA) 2 MG 24 hr capsule, Take 1 capsule (2 mg total) by mouth daily. (Patient not taking: Reported on 08/07/2016), Disp: 30 capsule, Rfl: 1  No Known Allergies   Review of Systems  HENT: Negative for sore throat.   Eyes: Negative for blurred vision.  Respiratory: Negative for shortness of breath.   Cardiovascular: Negative for chest pain and palpitations.  Gastrointestinal: Negative for abdominal pain and heartburn.  Neurological: Negative for headaches.    Objective  Vitals:   08/07/16 0851  BP: 120/74  Pulse: 73  Resp: 15  Temp: 97.6 F (36.4 C)  TempSrc: Oral  SpO2: 97%  Weight: 178 lb 11.2 oz (81.1 kg)  Height: 5\' 4"  (1.626 m)    Physical Exam  Constitutional: She is oriented to person, place, and time and well-developed, well-nourished, and in no distress.  HENT:  Head: Normocephalic and atraumatic.  Cardiovascular: Normal rate, regular rhythm and normal heart sounds.   No murmur heard. Pulmonary/Chest: Effort normal and breath sounds normal. She has no wheezes.  Neurological: She is alert and oriented to person, place, and time.  Psychiatric: Mood, memory, affect and judgment normal.  Nursing note and vitals reviewed.    Assessment & Plan  1. Essential hypertension  - bisoprolol-hydrochlorothiazide (ZIAC) 10-6.25 MG tablet; Take 1 tablet by mouth daily.  Dispense: 90 tablet; Refill: 1  2. Gastroesophageal reflux disease, esophagitis presence not specified  - omeprazole (PRILOSEC) 20 MG capsule; Take 1 capsule (20 mg total) by mouth daily.  Dispense: 90 capsule; Refill: 1   Rithvik Orcutt [facility] 08/07/2016 9:06 AM

## 2016-08-21 IMAGING — MR MR HEAD W/O CM
10 series · 48 of 48 positions shown · non-contrast
Comparison: None.

CLINICAL DATA: 68-year-old hypertensive female with hyperlipidemia
with feeling of dizziness with nausea started yesterday. Three
episodes since. Initial encounter.

EXAM:
MRI HEAD WITHOUT CONTRAST
TECHNIQUE: Multiplanar, multiecho pulse sequences of the brain and surrounding
structures were obtained without intravenous contrast.

[Series 2: T1 · sagittal · 5.0mm · 0.45mm/px · 2 of 25 slices shown (1 of 2)]
[im 1/25]
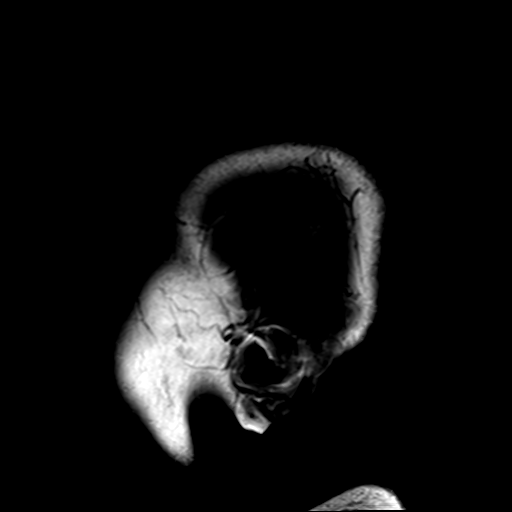
[im 25/25]
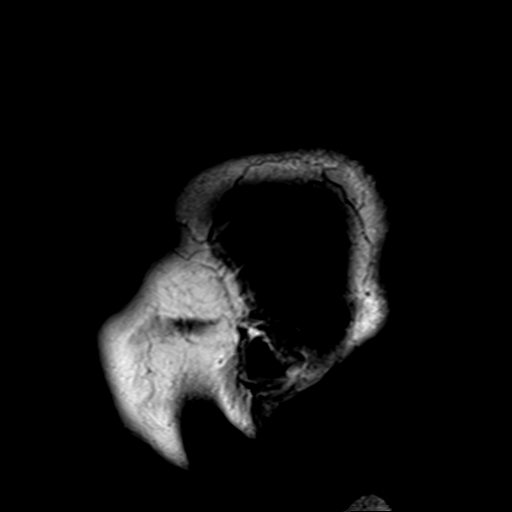

[Series 4: DWI · axial · 3.0mm · 1.80mm/px · z∈[-53,+111]mm · 7 of 56 slices shown (1 of 3)]
[im 1/56]
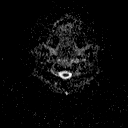
[im 10/56]
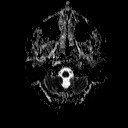
[im 19/56]
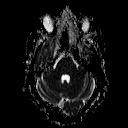
[im 28/56]
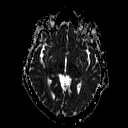
[im 37/56]
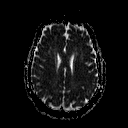
[im 46/56]
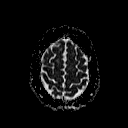
[im 56/56]
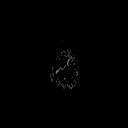

[Series 6: DWI · coronal · 3.0mm · 1.80mm/px · 6 of 47 slices shown (2 of 3)]
[im 1/47]
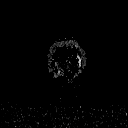
[im 10/47]
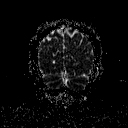
[im 19/47]
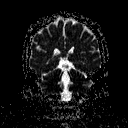
[im 28/47]
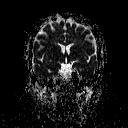
[im 37/47]
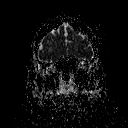
[im 47/47]
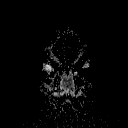

[Series 7: T2 · axial · 5.0mm · 0.60mm/px · z∈[-52,+110]mm · 3 of 26 slices shown (1 of 3)]
[im 1/26]
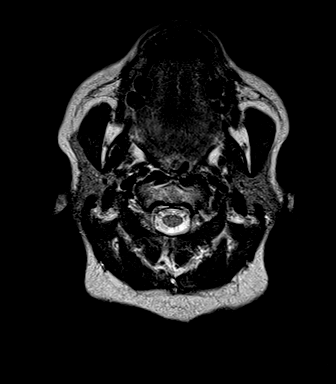
[im 13/26]
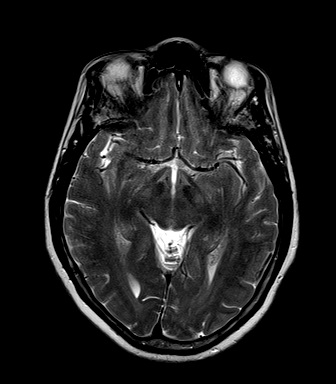
[im 26/26]
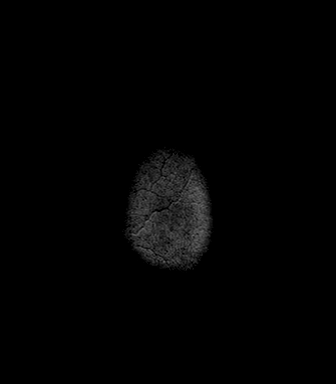

[Series 8: FLAIR · axial · 5.0mm · 0.45mm/px · z∈[-52,+110]mm · 3 of 26 slices shown]
[im 1/26]
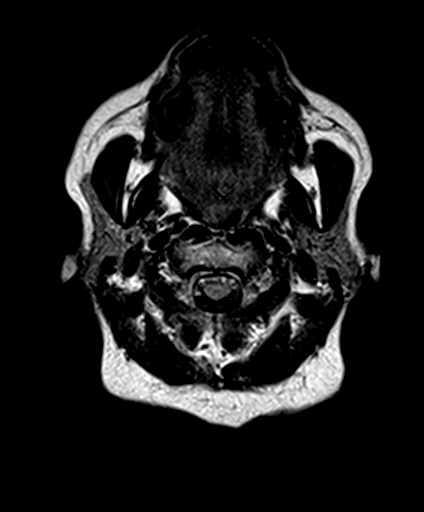
[im 13/26]
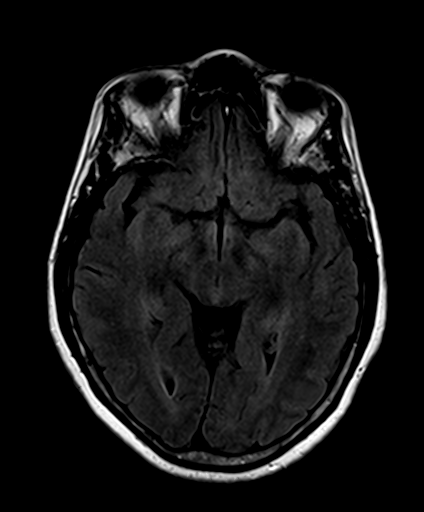
[im 26/26]
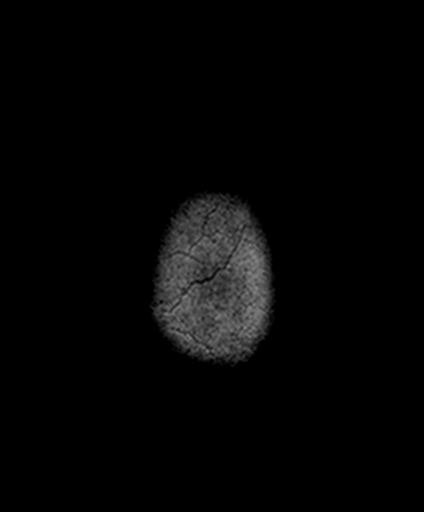

[Series 9: T2 · axial · 5.0mm · 0.45mm/px · z∈[-52,+110]mm · 3 of 26 slices shown (2 of 3)]
[im 1/26]
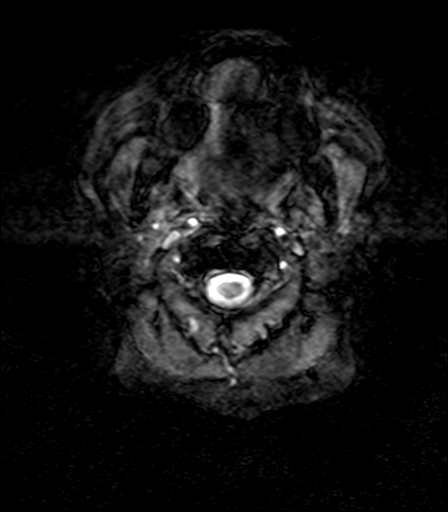
[im 13/26]
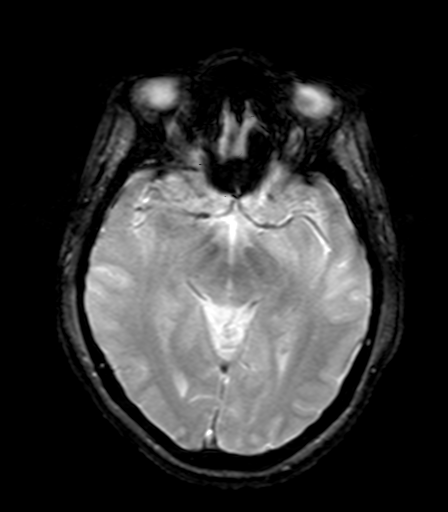
[im 26/26]
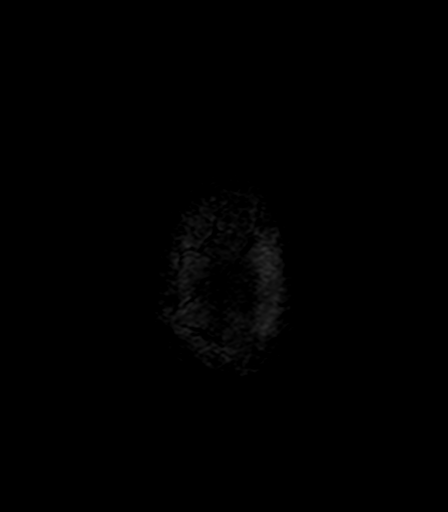

[Series 10: T1 · axial · 3.0mm · 1.00mm/px · z∈[-58,+118]mm · 7 of 60 slices shown (2 of 2)]
[im 1/60]
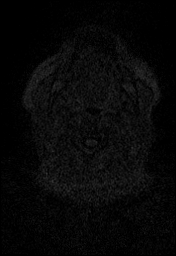
[im 10/60]
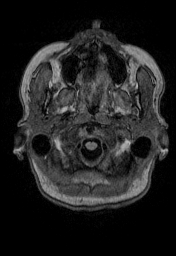
[im 20/60]
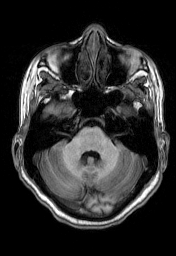
[im 30/60]
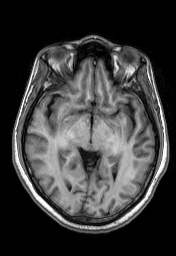
[im 40/60]
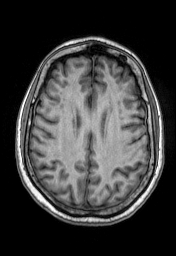
[im 50/60]
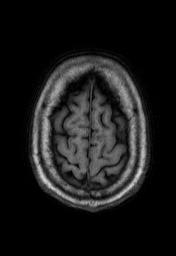
[im 60/60]
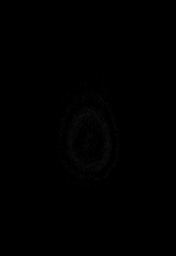

[Series 11: T2 · coronal · 5.0mm · 0.49mm/px · 4 of 30 slices shown (3 of 3)]
[im 1/30]
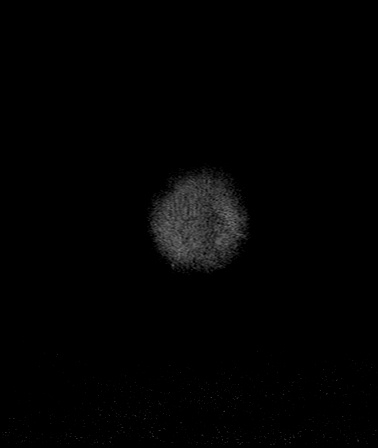
[im 10/30]
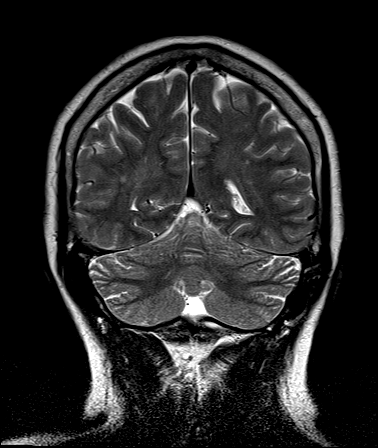
[im 20/30]
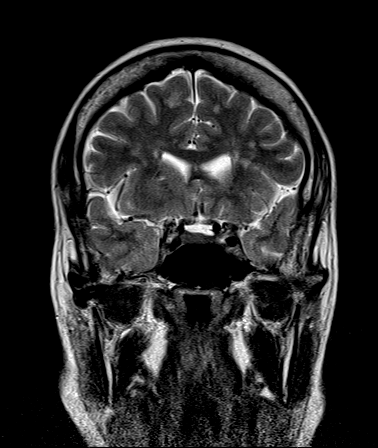
[im 30/30]
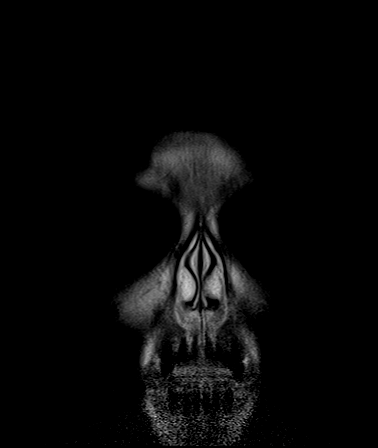

[Series 100: DWI · axial · 3.0mm · 1.80mm/px · z∈[-53,+111]mm · 7 of 56 slices shown (3 of 3)]
[im 1/56]
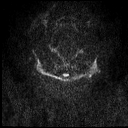
[im 10/56]
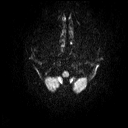
[im 19/56]
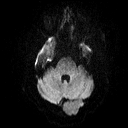
[im 28/56]
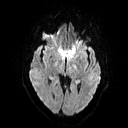
[im 37/56]
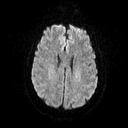
[im 46/56]
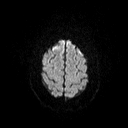
[im 56/56]
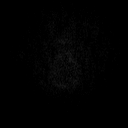

[Series 101: (id) cor dif · coronal · 3.0mm · 1.80mm/px · 6 of 46 slices shown]
[im 1/46]
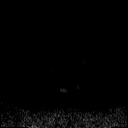
[im 10/46]
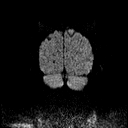
[im 19/46]
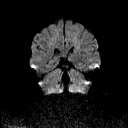
[im 28/46]
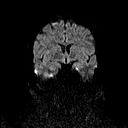
[im 37/46]
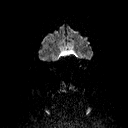
[im 46/46]
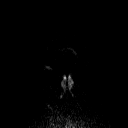

[48 of 48 positions shown; findings below may reference images not displayed]

FINDINGS: No acute infarct.

No intracranial hemorrhage.

Moderate punctate and patchy white matter type changes most
consistent with result of small vessel disease.

No intracranial mass lesion noted on this unenhanced exam.

Minimal partial opacification left mastoid air cells. Minimal
mucosal thickening ethmoid sinus air cells.

Mild cervical spondylotic changes C3-4 with mild spinal stenosis.
Cervical medullary junction, pituitary region, pineal region and
orbital structures unremarkable.

Major intracranial vascular structures are patent.
IMPRESSION: No acute infarct.

No intracranial hemorrhage.

Moderate punctate and patchy white matter type changes most
consistent with result of small vessel disease.

No intracranial mass lesion noted on this unenhanced exam.

Minimal partial opacification left mastoid air cells. Minimal
mucosal thickening ethmoid sinus air cells.

Mild cervical spondylotic changes C3-4 with mild spinal stenosis.

## 2016-11-06 ENCOUNTER — Ambulatory Visit: Payer: Medicare Other | Admitting: Family Medicine

## 2016-11-22 ENCOUNTER — Ambulatory Visit (INDEPENDENT_AMBULATORY_CARE_PROVIDER_SITE_OTHER): Payer: Medicare Other | Admitting: Family Medicine

## 2016-11-22 ENCOUNTER — Encounter: Payer: Self-pay | Admitting: Family Medicine

## 2016-11-22 DIAGNOSIS — M810 Age-related osteoporosis without current pathological fracture: Secondary | ICD-10-CM | POA: Diagnosis not present

## 2016-11-22 DIAGNOSIS — E782 Mixed hyperlipidemia: Secondary | ICD-10-CM | POA: Diagnosis not present

## 2016-11-22 DIAGNOSIS — R739 Hyperglycemia, unspecified: Secondary | ICD-10-CM

## 2016-11-22 DIAGNOSIS — Z5181 Encounter for therapeutic drug level monitoring: Secondary | ICD-10-CM | POA: Diagnosis not present

## 2016-11-22 DIAGNOSIS — I10 Essential (primary) hypertension: Secondary | ICD-10-CM | POA: Diagnosis not present

## 2016-11-22 LAB — LIPID PANEL
Cholesterol: 171 mg/dL (ref <200)
HDL: 95 mg/dL (ref >50)
LDL Cholesterol: 69 mg/dL (ref <100)
Total CHOL/HDL Ratio: 1.8 Ratio (ref <5.0)
Triglycerides: 34 mg/dL (ref <150)
VLDL: 7 mg/dL (ref <30)

## 2016-11-22 LAB — COMPLETE METABOLIC PANEL WITH GFR
ALT: 20 U/L (ref 6–29)
AST: 27 U/L (ref 10–35)
Albumin: 4 g/dL (ref 3.6–5.1)
Alkaline Phosphatase: 85 U/L (ref 33–130)
BUN: 19 mg/dL (ref 7–25)
CO2: 30 mmol/L (ref 20–31)
Calcium: 9.3 mg/dL (ref 8.6–10.4)
Chloride: 103 mmol/L (ref 98–110)
Creat: 0.65 mg/dL (ref 0.50–0.99)
GFR, Est African American: >89 mL/min (ref >=60)
GFR, Est Non African American: >89 mL/min (ref >=60)
Glucose, Bld: 99 mg/dL (ref 65–99)
Potassium: 4.3 mmol/L (ref 3.5–5.3)
Sodium: 141 mmol/L (ref 135–146)
Total Bilirubin: 1.1 mg/dL (ref 0.2–1.2)
Total Protein: 7 g/dL (ref 6.1–8.1)

## 2016-11-22 NOTE — Progress Notes (Signed)
BP (!) 142/88 (BP Location: Left Arm, Cuff Size: Large)   Pulse 64   Temp 98 F (36.7 C) (Oral)   Resp 16   Wt 182 lb (82.6 kg)   SpO2 96%   BMI 31.24 kg/m    Subjective:    Patient ID: Holly Green, female    DOB: March 12, 1946, 70 y.o.   MRN: [id number]  HPI: Holly Green is a 70 y.o. female  Chief Complaint  Patient presents with  . Follow-up    Fasting labs 3 mnth   . Headache    Last month    Here for follow-up; she is new to me today; previously followed here in our office by Dr. Rutherford Nail and then Dr. Manuella Ghazi No medical excitement She has high blood pressure; it is controlled today; she has been checking at home and it was elevated, 166/97 at home, 191/100 something on her cuff at home; she did not bring the cuff with her to check against ours today She had a headache last month; started to talk something for allergies and then it got better; she has been taking some allergy medicine OTC; she does not usually take a lot of stuff over-the-counter; seemed to help; was having pressure across her cheeks and forehead; better now She has high cholesterol; not eating out at fast foods; eats some eggs with other foods, but not just by themselves She has PPI on her medicine list; was having one episode of heartburn; not all the time Osteoporosis; on bisphosphonate; I asked if she has been on it for a while, five years; she suspects before 2012; not getting many greens; not much dairy; taking calcium plus D supplement; osteoporosis runs in the family Last DEXA reviewed; T score was -3.2 June of 2016; spine only; hip was -1.3 and -1.2 Meds reviewed; no more vertigo; no more bladder issues  Depression screen [facility] 2/9 11/22/2016 08/07/2016 05/07/2016 10/12/2015  Decreased Interest 0 0 0 0  Down, Depressed, Hopeless 0 0 0 0  PHQ - 2 Score 0 0 0 0   Relevant past medical, surgical, family and social history reviewed Past Medical History:  Diagnosis Date  . GERD (gastroesophageal reflux  disease)   . Hyperlipidemia   . Hypertension   . Osteoporosis    Past Surgical History:  Procedure Laterality Date  . ABDOMINAL HYSTERECTOMY     Family History  Problem Relation Age of Onset  . Hypertension Brother   . Heart disease Brother    Social History  Substance Use Topics  . Smoking status: Never Smoker  . Smokeless tobacco: Never Used  . Alcohol use No   Interim medical history since last visit reviewed. Allergies and medications reviewed  Review of Systems Per HPI unless specifically indicated above     Objective:    BP (!) 142/88 (BP Location: Left Arm, Cuff Size: Large)   Pulse 64   Temp 98 F (36.7 C) (Oral)   Resp 16   Wt 182 lb (82.6 kg)   SpO2 96%   BMI 31.24 kg/m   Wt Readings from Last 3 Encounters:  11/22/16 182 lb (82.6 kg)  08/07/16 178 lb 11.2 oz (81.1 kg)  05/07/16 174 lb 14.4 oz (79.3 kg)    Physical Exam  Constitutional: She appears well-developed and well-nourished. No distress.  HENT:  Head: Normocephalic and atraumatic.  Eyes: EOM are normal. No scleral icterus.  Neck: No thyromegaly present.  Cardiovascular: Normal rate, regular rhythm and normal heart sounds.  No murmur heard. Pulmonary/Chest: Effort normal and breath sounds normal. No respiratory distress. She has no wheezes.  Abdominal: Soft. Bowel sounds are normal. She exhibits no distension.  Musculoskeletal: Normal range of motion. She exhibits no edema.  Neurological: She is alert. She exhibits normal muscle tone.  Skin: Skin is warm and dry. She is not diaphoretic. No pallor.  Psychiatric: She has a normal mood and affect. Her behavior is normal. Judgment and thought content normal.       Assessment & Plan:   Problem List Items Addressed This Visit      Cardiovascular and Mediastinum   Essential hypertension (Chronic)    Running high at home; recheck by MD here was 142/88; avoid sodium when possible; try to follow the DASH guidelines; encouraged patient to bring  her cuff by our office to compare against ours; avoid decongestants; may use plain allergy/cold medicine if needed        Musculoskeletal and Integument   Osteoporosis (Chronic)    She has been on bisphosphonate for more than five years, so we need to stop that; continue calcium and vitamin D; practice good fall precautions; next DEXA is due July 2018; recommended referral to endo for treatment options, but patient politely declined      Relevant Medications   Calcium Carb-Cholecalciferol (CALCIUM-VITAMIN D) 600-400 MG-UNIT TABS   Other Relevant Orders   VITAMIN D 25 Hydroxy (Vit-D Deficiency, Fractures) (Completed)     Other   Medication monitoring encounter    Check renal function, sgpt      Relevant Orders   COMPLETE METABOLIC PANEL WITH GFR (Completed)   Hyperlipidemia (Chronic)    Check lipids; avoid/limit saturated fats; continue statin      Relevant Orders   Lipid panel (Completed)   Hyperglycemia    Check glucose and A1c; avoid starches      Relevant Orders   Hemoglobin A1c (Completed)      Follow up plan: Return in about 3 months (around 02/20/2017) for visit, hypertension.  An after-visit summary was printed and given to the patient at [facility].  Please see the patient instructions which may contain other information and recommendations beyond what is mentioned above in the assessment and plan.  Meds ordered this encounter  Medications  . Calcium Carb-Cholecalciferol (CALCIUM-VITAMIN D) 600-400 MG-UNIT TABS    Sig: Take 1 tablet by mouth daily.    Orders Placed This Encounter  Procedures  . Lipid panel  . VITAMIN D 25 Hydroxy (Vit-D Deficiency, Fractures)  . COMPLETE METABOLIC PANEL WITH GFR  . Hemoglobin A1c

## 2016-11-22 NOTE — Assessment & Plan Note (Signed)
Check lipids; avoid/limit saturated fats; continue statin

## 2016-11-22 NOTE — Assessment & Plan Note (Signed)
She has been on bisphosphonate for more than five years, so we need to stop that; continue calcium and vitamin D; practice good fall precautions; next DEXA is due July 2018; recommended referral to endo for treatment options, but patient politely declined

## 2016-11-22 NOTE — Assessment & Plan Note (Signed)
Check renal function, sgpt

## 2016-11-22 NOTE — Assessment & Plan Note (Addendum)
Running high at home; recheck by MD here was 142/88; avoid sodium when possible; try to follow the DASH guidelines; encouraged patient to bring her cuff by our office to compare against ours; avoid decongestants; may use plain allergy/cold medicine if needed

## 2016-11-22 NOTE — Patient Instructions (Addendum)
Try to use PLAIN allergy medicine without the decongestant Avoid: phenylephrine, phenylpropanolamine, and pseudoephredine Your goal blood pressure is less than 150 mmHg on top. Try to follow the DASH guidelines (DASH stands for Dietary Approaches to Stop Hypertension) Try to limit the sodium in your diet.  Ideally, consume less than 1.5 grams (less than 1,500mg ) per day. Do not add salt when cooking or at the table.  Check the sodium amount on labels when shopping, and choose items lower in sodium when given a choice. Avoid or limit foods that already contain a lot of sodium. Eat a diet rich in fruits and vegetables and whole grains. Limit eggs to no more than 3 per week Try to limit saturated fats in your diet (bologna, hot dogs, barbeque, cheeseburgers, hamburgers, steak, bacon, sausage, cheese, etc.) and get more fresh fruits, vegetables, and whole grains Please bring your blood pressure cuff by in the next week or so when convenient and let's compare your cuff against ours to see if yours is accurate STOP the alendronate (Fosamax) Practice good fall precautions  DASH Eating Plan DASH stands for "Dietary Approaches to Stop Hypertension." The DASH eating plan is a healthy eating plan that has been shown to reduce high blood pressure (hypertension). Additional health benefits may include reducing the risk of type 2 diabetes mellitus, heart disease, and stroke. The DASH eating plan may also help with weight loss. What do I need to know about the DASH eating plan? For the DASH eating plan, you will follow these general guidelines:  Choose foods with less than 150 milligrams of sodium per serving (as listed on the food label).  Use salt-free seasonings or herbs instead of table salt or sea salt.  Check with your health care provider or pharmacist before using salt substitutes.  Eat lower-sodium products. These are often labeled as "low-sodium" or "no salt added."  Eat fresh foods. Avoid eating  a lot of canned foods.  Eat more vegetables, fruits, and low-fat dairy products.  Choose whole grains. Look for the word "whole" as the first word in the ingredient list.  Choose fish and skinless chicken or [place] more often than red meat. Limit fish, poultry, and meat to 6 oz (170 g) each day.  Limit sweets, desserts, sugars, and sugary drinks.  Choose heart-healthy fats.  Eat more home-cooked food and less restaurant, buffet, and fast food.  Limit fried foods.  Do not fry foods. Cook foods using methods such as baking, boiling, grilling, and broiling instead.  When eating at a restaurant, ask that your food be prepared with less salt, or no salt if possible. What foods can I eat? Seek help from a dietitian for individual calorie needs. Grains  Whole grain or whole wheat bread. Brown rice. Whole grain or whole wheat pasta. Quinoa, bulgur, and whole grain cereals. Low-sodium cereals. Corn or whole wheat flour tortillas. Whole grain cornbread. Whole grain crackers. Low-sodium crackers. Vegetables  Fresh or frozen vegetables (raw, steamed, roasted, or grilled). Low-sodium or reduced-sodium tomato and vegetable juices. Low-sodium or reduced-sodium tomato sauce and paste. Low-sodium or reduced-sodium canned vegetables. Fruits  All fresh, canned (in natural juice), or frozen fruits. Meat and Other Protein Products  Ground beef (85% or leaner), grass-fed beef, or beef trimmed of fat. Skinless chicken or [place]. Ground chicken or [place]. Pork trimmed of fat. All fish and seafood. Eggs. Dried beans, peas, or lentils. Unsalted nuts and seeds. Unsalted canned beans. Dairy  Low-fat dairy products, such as skim or 1% milk,  2% or reduced-fat cheeses, low-fat ricotta or cottage cheese, or plain low-fat yogurt. Low-sodium or reduced-sodium cheeses. Fats and Oils  Tub margarines without trans fats. Light or reduced-fat mayonnaise and salad dressings (reduced sodium). Avocado. Safflower, olive, or  canola oils. Natural peanut or almond butter. Other  Unsalted popcorn and pretzels. The items listed above may not be a complete list of recommended foods or beverages. Contact your dietitian for more options.  What foods are not recommended? Grains  White bread. White pasta. White rice. Refined cornbread. Bagels and croissants. Crackers that contain trans fat. Vegetables  Creamed or fried vegetables. Vegetables in a cheese sauce. Regular canned vegetables. Regular canned tomato sauce and paste. Regular tomato and vegetable juices. Fruits  Canned fruit in light or heavy syrup. Fruit juice. Meat and Other Protein Products  Fatty cuts of meat. Ribs, chicken wings, bacon, sausage, bologna, salami, chitterlings, fatback, hot dogs, bratwurst, and packaged luncheon meats. Salted nuts and seeds. Canned beans with salt. Dairy  Whole or 2% milk, cream, half-and-half, and cream cheese. Whole-fat or sweetened yogurt. Full-fat cheeses or blue cheese. Nondairy creamers and whipped toppings. Processed cheese, cheese spreads, or cheese curds. Condiments  Onion and garlic salt, seasoned salt, table salt, and sea salt. Canned and packaged gravies. Worcestershire sauce. Tartar sauce. Barbecue sauce. Teriyaki sauce. Soy sauce, including reduced sodium. Steak sauce. Fish sauce. Oyster sauce. Cocktail sauce. Horseradish. Ketchup and mustard. Meat flavorings and tenderizers. Bouillon cubes. Hot sauce. Tabasco sauce. Marinades. Taco seasonings. Relishes. Fats and Oils  Butter, stick margarine, lard, shortening, ghee, and bacon fat. Coconut, palm kernel, or palm oils. Regular salad dressings. Other  Pickles and olives. Salted popcorn and pretzels. The items listed above may not be a complete list of foods and beverages to avoid. Contact your dietitian for more information.  Where can I find more information? National Heart, Lung, and Blood Institute: travelstabloid.com This  information is not intended to replace advice given to you by your health care provider. Make sure you discuss any questions you have with your health care provider. Document Released: 10/31/2011 Document Revised: 04/18/2016 Document Reviewed: 09/15/2013 Elsevier Interactive Patient Education  2017 [place].  Osteoporosis Osteoporosis is the thinning and loss of density in the bones. Osteoporosis makes the bones more brittle, fragile, and likely to break (fracture). Over time, osteoporosis can cause the bones to become so weak that they fracture after a simple fall. The bones most likely to fracture are the bones in the hip, wrist, and spine. What are the causes? The exact cause is not known. What increases the risk? Anyone can develop osteoporosis. You may be at greater risk if you have a family history of the condition or have poor nutrition. You may also have a higher risk if you are:  Female.  67 years old or older.  A smoker.  Not physically active.  White or Asian.  Slender. What are the signs or symptoms? A fracture might be the first sign of the disease, especially if it results from a fall or injury that would not usually cause a bone to break. Other signs and symptoms include:  Low back and neck pain.  Stooped posture.  Height loss. How is this diagnosed? To make a diagnosis, your health care provider may:  Take a medical history.  Perform a physical exam.  Order tests, such as:  A bone mineral density test.  A dual-energy X-ray absorptiometry test. How is this treated? The goal of osteoporosis treatment is to strengthen your  bones to reduce your risk of a fracture. Treatment may involve:  Making lifestyle changes, such as:  Eating a diet rich in calcium.  Doing weight-bearing and muscle-strengthening exercises.  Stopping tobacco use.  Limiting alcohol intake.  Taking medicine to slow the process of bone loss or to increase bone  density.  Monitoring your levels of calcium and vitamin D. Follow these instructions at home:  Include calcium and vitamin D in your diet. Calcium is important for bone health, and vitamin D helps the body absorb calcium.  Perform weight-bearing and muscle-strengthening exercises as directed by your health care provider.  Do not use any tobacco products, including cigarettes, chewing tobacco, and electronic cigarettes. If you need help quitting, ask your health care provider.  Limit your alcohol intake.  Take medicines only as directed by your health care provider.  Keep all follow-up visits as directed by your health care provider. This is important.  Take precautions at home to lower your risk of falling, such as:  Keeping rooms well lit and clutter free.  Installing safety rails on stairs.  Using rubber mats in the bathroom and other areas that are often wet or slippery. Get help right away if: You fall or injure yourself. This information is not intended to replace advice given to you by your health care provider. Make sure you discuss any questions you have with your health care provider. Document Released: 08/21/2005 Document Revised: 04/15/2016 Document Reviewed: 04/21/2014 Elsevier Interactive Patient Education  2017 [place] Prevention in the Home Introduction Falls can cause injuries. They can happen to people of all ages. There are many things you can do to make your home safe and to help prevent falls. What can I do on the outside of my home?  Regularly fix the edges of walkways and driveways and fix any cracks.  Remove anything that might make you trip as you walk through a door, such as a raised step or threshold.  Trim any bushes or trees on the path to your home.  Use bright outdoor lighting.  Clear any walking paths of anything that might make someone trip, such as rocks or tools.  Regularly check to see if handrails are loose or broken. Make  sure that both sides of any steps have handrails.  Any raised decks and porches should have guardrails on the edges.  Have any leaves, snow, or ice cleared regularly.  Use sand or salt on walking paths during winter.  Clean up any spills in your garage right away. This includes oil or grease spills. What can I do in the bathroom?  Use night lights.  Install grab bars by the toilet and in the tub and shower. Do not use towel bars as grab bars.  Use non-skid mats or decals in the tub or shower.  If you need to sit down in the shower, use a plastic, non-slip stool.  Keep the floor dry. Clean up any water that spills on the floor as soon as it happens.  Remove soap buildup in the tub or shower regularly.  Attach bath mats securely with double-sided non-slip rug tape.  Do not have throw rugs and other things on the floor that can make you trip. What can I do in the bedroom?  Use night lights.  Make sure that you have a light by your bed that is easy to reach.  Do not use any sheets or blankets that are too big for your bed. They should not  hang down onto the floor.  Have a firm chair that has side arms. You can use this for support while you get dressed.  Do not have throw rugs and other things on the floor that can make you trip. What can I do in the kitchen?  Clean up any spills right away.  Avoid walking on wet floors.  Keep items that you use a lot in easy-to-reach places.  If you need to reach something above you, use a strong step stool that has a grab bar.  Keep electrical cords out of the way.  Do not use floor polish or wax that makes floors slippery. If you must use wax, use non-skid floor wax.  Do not have throw rugs and other things on the floor that can make you trip. What can I do with my stairs?  Do not leave any items on the stairs.  Make sure that there are handrails on both sides of the stairs and use them. Fix handrails that are broken or loose.  Make sure that handrails are as long as the stairways.  Check any carpeting to make sure that it is firmly attached to the stairs. Fix any carpet that is loose or worn.  Avoid having throw rugs at the top or bottom of the stairs. If you do have throw rugs, attach them to the floor with carpet tape.  Make sure that you have a light switch at the top of the stairs and the bottom of the stairs. If you do not have them, ask someone to add them for you. What else can I do to help prevent falls?  Wear shoes that:  Do not have high heels.  Have rubber bottoms.  Are comfortable and fit you well.  Are closed at the toe. Do not wear sandals.  If you use a stepladder:  Make sure that it is fully opened. Do not climb a closed stepladder.  Make sure that both sides of the stepladder are locked into place.  Ask someone to hold it for you, if possible.  Clearly mark and make sure that you can see:  Any grab bars or handrails.  First and last steps.  Where the edge of each step is.  Use tools that help you move around (mobility aids) if they are needed. These include:  Canes.  Walkers.  Scooters.  Crutches.  Turn on the lights when you go into a dark area. Replace any light bulbs as soon as they burn out.  Set up your furniture so you have a clear path. Avoid moving your furniture around.  If any of your floors are uneven, fix them.  If there are any pets around you, be aware of where they are.  Review your medicines with your doctor. Some medicines can make you feel dizzy. This can increase your chance of falling. Ask your doctor what other things that you can do to help prevent falls. This information is not intended to replace advice given to you by your health care provider. Make sure you discuss any questions you have with your health care provider. Document Released: 09/07/2009 Document Revised: 04/18/2016 Document Reviewed: 12/16/2014  2017 Elsevier

## 2016-11-22 NOTE — Assessment & Plan Note (Signed)
Check glucose and A1c; avoid starches

## 2016-11-23 LAB — VITAMIN D 25 HYDROXY (VIT D DEFICIENCY, FRACTURES): Vit D, 25-Hydroxy: 35 ng/mL (ref 30–100)

## 2016-11-23 LAB — HEMOGLOBIN A1C
Hgb A1c MFr Bld: 6 % — ABNORMAL HIGH (ref <5.7)
Mean Plasma Glucose: 126 mg/dL

## 2016-11-27 ENCOUNTER — Ambulatory Visit: Payer: Medicare Other

## 2016-11-27 VITALS — BP 136/78 | HR 94

## 2016-11-27 DIAGNOSIS — I10 Essential (primary) hypertension: Secondary | ICD-10-CM

## 2016-12-20 ENCOUNTER — Other Ambulatory Visit: Payer: Self-pay | Admitting: Family Medicine

## 2016-12-20 DIAGNOSIS — E785 Hyperlipidemia, unspecified: Secondary | ICD-10-CM

## 2016-12-20 MED ORDER — LOVASTATIN 20 MG PO TABS
20.0000 mg | ORAL_TABLET | Freq: Every day | ORAL | 3 refills | Status: DC
Start: 2016-12-20 — End: 2018-01-08

## 2016-12-20 NOTE — Telephone Encounter (Signed)
PT NEEDS REFILL ON LOVASTATIN . [facility] APPT ON MARCH

## 2016-12-25 ENCOUNTER — Telehealth: Payer: Self-pay | Admitting: Family Medicine

## 2016-12-25 NOTE — Telephone Encounter (Signed)
Pt states that she was taken off omeprazole however she is experiencing a lot of heartburn, gas in chest, indigestion.

## 2016-12-28 MED ORDER — OMEPRAZOLE 20 MG PO CPDR: 20.0000 mg | DELAYED_RELEASE_CAPSULE | Freq: Every day | ORAL | 0 refills | Status: DC

## 2016-12-28 NOTE — Telephone Encounter (Signed)
I called, reached voicemail; explained that PPI not meant for long-term use because of risks, but I'll give her one more month Rx Avoid triggers, such as tomato-based foods, onions, chocolate, green peppers, etc. Call with any ongoing issues

## 2017-02-03 ENCOUNTER — Other Ambulatory Visit: Payer: Self-pay | Admitting: Family Medicine

## 2017-02-03 DIAGNOSIS — I10 Essential (primary) hypertension: Secondary | ICD-10-CM

## 2017-02-03 NOTE — Telephone Encounter (Signed)
Pt has appt for 02-20-17 however she only have 2 pills left on bisoprolol-hydrochlorothiazide. Please send in enough to last until appt. Uses [facility] [phone]

## 2017-02-04 MED ORDER — BISOPROLOL-HYDROCHLOROTHIAZIDE 10-6.25 MG PO TABS: 1.0000 | ORAL_TABLET | Freq: Every day | ORAL | 2 refills | Status: DC

## 2017-02-04 NOTE — Telephone Encounter (Signed)
Nine month supply approved

## 2017-02-20 ENCOUNTER — Encounter: Payer: Self-pay | Admitting: Family Medicine

## 2017-02-20 ENCOUNTER — Ambulatory Visit (INDEPENDENT_AMBULATORY_CARE_PROVIDER_SITE_OTHER): Payer: Medicare Other | Admitting: Family Medicine

## 2017-02-20 VITALS — BP 134/80 | HR 66 | Temp 98.4°F | Resp 16 | Wt 180.2 lb

## 2017-02-20 DIAGNOSIS — R1032 Left lower quadrant pain: Secondary | ICD-10-CM | POA: Diagnosis not present

## 2017-02-20 DIAGNOSIS — N390 Urinary tract infection, site not specified: Secondary | ICD-10-CM | POA: Diagnosis not present

## 2017-02-20 DIAGNOSIS — K5730 Diverticulosis of large intestine without perforation or abscess without bleeding: Secondary | ICD-10-CM

## 2017-02-20 DIAGNOSIS — M545 Low back pain, unspecified: Secondary | ICD-10-CM

## 2017-02-20 DIAGNOSIS — Z1231 Encounter for screening mammogram for malignant neoplasm of breast: Secondary | ICD-10-CM | POA: Diagnosis not present

## 2017-02-20 DIAGNOSIS — G8929 Other chronic pain: Secondary | ICD-10-CM | POA: Diagnosis not present

## 2017-02-20 DIAGNOSIS — R309 Painful micturition, unspecified: Secondary | ICD-10-CM

## 2017-02-20 DIAGNOSIS — Z1159 Encounter for screening for other viral diseases: Secondary | ICD-10-CM | POA: Diagnosis not present

## 2017-02-20 DIAGNOSIS — Z1239 Encounter for other screening for malignant neoplasm of breast: Secondary | ICD-10-CM

## 2017-02-20 DIAGNOSIS — R3129 Other microscopic hematuria: Secondary | ICD-10-CM

## 2017-02-20 DIAGNOSIS — R8281 Pyuria: Secondary | ICD-10-CM

## 2017-02-20 LAB — CBC WITH DIFFERENTIAL/PLATELET
Basophils Absolute: 0 cells/uL (ref 0–200)
Basophils Relative: 0 %
Eosinophils Absolute: 96 cells/uL (ref 15–500)
Eosinophils Relative: 2 %
HCT: 35.5 % (ref 35.0–45.0)
Hemoglobin: 11.5 g/dL — ABNORMAL LOW (ref 11.7–15.5)
Lymphocytes Relative: 41 %
Lymphs Abs: 1968 cells/uL (ref 850–3900)
MCH: 28.3 pg (ref 27.0–33.0)
MCHC: 32.4 g/dL (ref 32.0–36.0)
MCV: 87.4 fL (ref 80.0–100.0)
MPV: 8.5 fL (ref 7.5–12.5)
Monocytes Absolute: 432 cells/uL (ref 200–950)
Monocytes Relative: 9 %
Neutro Abs: 2304 cells/uL (ref 1500–7800)
Neutrophils Relative %: 48 %
Platelets: 221 10*3/uL (ref 140–400)
RBC: 4.06 MIL/uL (ref 3.80–5.10)
RDW: 14.6 % (ref 11.0–15.0)
WBC: 4.8 10*3/uL (ref 3.8–10.8)

## 2017-02-20 LAB — COMPLETE METABOLIC PANEL WITH GFR
ALT: 13 U/L (ref 6–29)
AST: 20 U/L (ref 10–35)
Albumin: 3.9 g/dL (ref 3.6–5.1)
Alkaline Phosphatase: 88 U/L (ref 33–130)
BUN: 15 mg/dL (ref 7–25)
CO2: 28 mmol/L (ref 20–31)
Calcium: 9.2 mg/dL (ref 8.6–10.4)
Chloride: 102 mmol/L (ref 98–110)
Creat: 0.65 mg/dL (ref 0.50–0.99)
GFR, Est African American: >89 mL/min (ref >=60)
GFR, Est Non African American: >89 mL/min (ref >=60)
Glucose, Bld: 84 mg/dL (ref 65–99)
Potassium: 4.5 mmol/L (ref 3.5–5.3)
Sodium: 140 mmol/L (ref 135–146)
Total Bilirubin: 1.3 mg/dL — ABNORMAL HIGH (ref 0.2–1.2)
Total Protein: 7 g/dL (ref 6.1–8.1)

## 2017-02-20 LAB — POCT URINALYSIS DIPSTICK
Bilirubin, UA: NEGATIVE
Glucose, UA: NEGATIVE
Ketones, UA: NEGATIVE
Nitrite, UA: NEGATIVE
Protein, UA: NEGATIVE
Spec Grav, UA: 1.015 (ref 1.030–1.035)
Urobilinogen, UA: NEGATIVE (ref ?–2.0)
pH, UA: 5 (ref 5.0–8.0)

## 2017-02-20 MED ORDER — NITROFURANTOIN MONOHYD MACRO 100 MG PO CAPS: 100.0000 mg | ORAL_CAPSULE | Freq: Two times a day (BID) | ORAL | 0 refills | Status: AC

## 2017-02-20 MED ORDER — RANITIDINE HCL 150 MG PO TABS: 150.0000 mg | ORAL_TABLET | Freq: Two times a day (BID) | ORAL | 2 refills | Status: DC

## 2017-02-20 NOTE — Patient Instructions (Addendum)
Let's start the antibiotic We'll get labs and a CT scan Go to the ER if symptoms become severe Caution: prolonged use of proton pump inhibitors like omeprazole (Prilosec), pantoprazole (Protonix), esomeprazole (Nexium), and others like Dexilant and Aciphex may increase your risk of pneumonia, Clostridium difficile colitis, osteoporosis, anemia and other health complications Try to limit or avoid triggers like coffee, caffeinated beverages, onions, chocolate, spicy foods, peppermint, acid foods like pizza, spaghetti sauce, and orange juice Lose weight if you are overweight or obese Try elevating the head of your bed by placing a small wedge between your mattress and box springs to keep acid in the stomach at night instead of coming up into your esophagus Stop the omeprazole and try ranitidine instead

## 2017-02-20 NOTE — Assessment & Plan Note (Signed)
ddx includes diverticulitis

## 2017-02-20 NOTE — Progress Notes (Signed)
BP 134/80 (BP Location: Left Arm, Patient Position: Sitting, Cuff Size: Normal)   Pulse 66   Temp 98.4 F (36.9 C) (Oral)   Resp 16   Wt 180 lb 4 oz (81.8 kg)   SpO2 97%   BMI 30.94 kg/m    Subjective:    Patient ID: Holly Green, female    DOB: [birth year], 71 y.o.   MRN: [id number]  HPI: Holly Green is a 71 y.o. female  Chief Complaint  Patient presents with  . Hypertension    3 month follow up  . Abdominal Pain    When pt use the restroom; reoccuring pain.    Patient is here for abdominal pain  She had this before and Dr. Rutherford Nail ordered ultrasounds and nothing was found; in the mornings, she'll urinate and it will hurt; worse in the mornings; no burning; no visible blood Appetite is fine; no fevers; mainly left lower abdomen; occasionally on the right side too; mashed her stomach once and jumped, then mashed it again and she didn't jump; no one particular spot now; no blood in the stool Years ago, may have one kidney stone, but not a recurring problem Dr. 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  No acute abnormalities.   Electronically Signed   By: Lavonia Dana M.D.   On: 04/01/2016 16:28 ----------------------------------------  Hypertension Patient has had hypertension since her 20's Checking blood pressure away from here?  yes How often? Occasionally Range (low to high) over last two weeks:  130 / 76 If taking medicines, are you taking them regularly?  yes Siblings / family history: Does high blood pressure run in your family?   YES Salt:  Trying to limit sodium / salt when buying foods at the grocery store?  yes Do you try to limit added salt when cooking and at the table?  NO, cooks with salt Sweets/licorice:  Do you eat a  lot of sweets or eat black licorice?  no Saturated fats: Do you eat a lot of foods like bacon, sausage, pepperoni, cheeseburgers, hot dogs, bologna, and cheese?  no Do you take prednisone or prescription NSAIDs or take OTCS NSAIDs such as ibuprofen, Motrin, Advil, Aleve, or naproxen? no  Smoking: Do you smoke?  no Sudafed (decongestants): Do you use any OTC decongestant products like Allegra-D, Claritin-D, Zyrtec-D, Tylenol Cold and Sinus, etc.?  no   Depression screen [facility] 2/9 03/07/2017 02/20/2017 11/22/2016 08/07/2016 05/07/2016  Decreased Interest 0 0 0 0 0  Down, Depressed, Hopeless 0 0 0 0 0  PHQ - 2 Score 0 0 0 0 0    Relevant past medical, surgical, family and social history reviewed Past Medical History:  Diagnosis Date  . Abdominal aortic atherosclerosis ([facility]) 03/05/2017   CT scan April 2018  . GERD (gastroesophageal reflux disease)   . Hyperlipidemia   . Hypertension   . Osteoporosis   . Prediabetes 10/15/2015  . Spondylolisthesis at L4-L5 level 03/07/2017   Past Surgical History:  Procedure Laterality Date  . ABDOMINAL HYSTERECTOMY     Family History  Problem Relation Age of Onset  . Hypertension Brother   . Heart disease Brother   . Heart failure Brother   . Hypertension Mother   . Stroke Mother   . Stroke Father   .  Aneurysm Sister   . Liver disease Maternal Aunt   . Heart failure Paternal Aunt   . Heart disease Paternal Uncle   . Heart attack Brother   . Heart disease Brother   . Cancer Maternal Aunt     breast caner ?  . Breast cancer Maternal Aunt   . Heart disease Paternal Aunt    Social History  Substance Use Topics  . Smoking status: Never Smoker  . Smokeless tobacco: Never Used  . Alcohol use No    Interim medical history since last visit reviewed. Allergies and medications reviewed  Review of Systems Per HPI unless specifically indicated above     Objective:    BP 134/80 (BP Location: Left Arm, Patient Position: Sitting, Cuff Size:  Normal)   Pulse 66   Temp 98.4 F (36.9 C) (Oral)   Resp 16   Wt 180 lb 4 oz (81.8 kg)   SpO2 97%   BMI 30.94 kg/m   Wt Readings from Last 3 Encounters:  03/07/17 176 lb (79.8 kg)  02/20/17 180 lb 4 oz (81.8 kg)  11/22/16 182 lb (82.6 kg)    Physical Exam  Constitutional: She appears well-developed and well-nourished. No distress.  HENT:  Head: Normocephalic and atraumatic.  Eyes: EOM are normal. No scleral icterus.  Neck: No thyromegaly present.  Cardiovascular: Normal rate, regular rhythm and normal heart sounds.   No murmur heard. Pulmonary/Chest: Effort normal and breath sounds normal. No respiratory distress. She has no wheezes.  Abdominal: Soft. Bowel sounds are normal. She exhibits no distension.  Musculoskeletal: Normal range of motion. She exhibits no edema.  Neurological: She is alert. She exhibits normal muscle tone.  Skin: Skin is warm and dry. She is not diaphoretic. No pallor.  Psychiatric: She has a normal mood and affect. Her behavior is normal. Judgment and thought content normal.       Assessment & Plan:   Problem List Items Addressed This Visit      Digestive   Diverticulosis of colon, acquired    ddx includes diverticulitis      Relevant Orders   CT Abdomen Pelvis W Contrast (Completed)    Other Visit Diagnoses    Hematuria, microscopic    -  Primary   Relevant Orders   CT Abdomen Pelvis W Contrast (Completed)   Screening for breast cancer       Relevant Orders   MM Digital Screening   Need for hepatitis C screening test       Relevant Orders   Hepatitis C Antibody (Completed)   Urination pain       Relevant Orders   POCT urinalysis dipstick (Completed)   Abdominal pain, LLQ       Relevant Orders   CT Abdomen Pelvis W Contrast (Completed)   COMPLETE METABOLIC PANEL WITH GFR (Completed)   CBC with Differential/Platelet (Completed)   Chronic left-sided low back pain without sciatica       Relevant Orders   CT Abdomen Pelvis W Contrast  (Completed)   Pyuria       Relevant Orders   Urine Culture (Completed)      Follow up plan: Return in about 2 weeks (around 03/06/2017) for follow-up.  An after-visit summary was printed and given to the patient at [facility].  Please see the patient instructions which may contain other information and recommendations beyond what is mentioned above in the assessment and plan.  Meds ordered this encounter  Medications  . nitrofurantoin, macrocrystal-monohydrate, (MACROBID) 100  MG capsule    Sig: Take 1 capsule (100 mg total) by mouth 2 (two) times daily.    Dispense:  14 capsule    Refill:  0  . ranitidine (ZANTAC) 150 MG tablet    Sig: Take 1 tablet (150 mg total) by mouth 2 (two) times daily. If needed for heartburn, acid reflux    Dispense:  60 tablet    Refill:  2    Orders Placed This Encounter  Procedures  . Urine Culture  . MM Digital Screening  . CT Abdomen Pelvis W Contrast  . Hepatitis C Antibody  . COMPLETE METABOLIC PANEL WITH GFR  . CBC with Differential/Platelet  . POCT urinalysis dipstick

## 2017-02-21 LAB — URINE CULTURE: Organism ID, Bacteria: NO GROWTH

## 2017-02-21 LAB — HEPATITIS C ANTIBODY: HCV Ab: NEGATIVE

## 2017-02-25 ENCOUNTER — Telehealth: Payer: Self-pay

## 2017-02-25 NOTE — Telephone Encounter (Signed)
Called this patient to inform her that she has been scheduled to have her CT scan on 03/03/17 @ 9am at the [facility] ([address] but there was no answer. She was informed that she will have to pick up a prep kit prior to imaging and to arrive 15 mins early.  She was asked to give [name] a call back to confirm that she received this message.

## 2017-03-03 ENCOUNTER — Ambulatory Visit
Admission: RE | Admit: 2017-03-03 | Discharge: 2017-03-03 | Disposition: A | Discharge status: Home / Self Care | Payer: Medicare Other | Source: Ambulatory Visit | Attending: Family Medicine | Admitting: Family Medicine

## 2017-03-03 DIAGNOSIS — Z90710 Acquired absence of both cervix and uterus: Secondary | ICD-10-CM | POA: Diagnosis not present

## 2017-03-03 DIAGNOSIS — K5730 Diverticulosis of large intestine without perforation or abscess without bleeding: Secondary | ICD-10-CM | POA: Diagnosis not present

## 2017-03-03 DIAGNOSIS — K7689 Other specified diseases of liver: Secondary | ICD-10-CM | POA: Diagnosis not present

## 2017-03-03 DIAGNOSIS — R3129 Other microscopic hematuria: Secondary | ICD-10-CM | POA: Insufficient documentation

## 2017-03-03 DIAGNOSIS — G8929 Other chronic pain: Secondary | ICD-10-CM | POA: Insufficient documentation

## 2017-03-03 DIAGNOSIS — I700 Atherosclerosis of aorta: Secondary | ICD-10-CM | POA: Diagnosis not present

## 2017-03-03 DIAGNOSIS — M545 Low back pain: Secondary | ICD-10-CM | POA: Diagnosis not present

## 2017-03-03 DIAGNOSIS — M4316 Spondylolisthesis, lumbar region: Secondary | ICD-10-CM | POA: Diagnosis not present

## 2017-03-03 DIAGNOSIS — M4317 Spondylolisthesis, lumbosacral region: Secondary | ICD-10-CM | POA: Diagnosis not present

## 2017-03-03 DIAGNOSIS — R1032 Left lower quadrant pain: Secondary | ICD-10-CM | POA: Insufficient documentation

## 2017-03-03 DIAGNOSIS — R109 Unspecified abdominal pain: Secondary | ICD-10-CM | POA: Diagnosis not present

## 2017-03-03 MED ORDER — IOPAMIDOL (ISOVUE-300) INJECTION 61%
100.0000 mL | Freq: Once | INTRAVENOUS | Status: AC | PRN
  Administered 2017-03-03: 100 mL via INTRAVENOUS

## 2017-03-05 ENCOUNTER — Encounter: Payer: Self-pay | Admitting: Family Medicine

## 2017-03-05 DIAGNOSIS — N281 Cyst of kidney, acquired: Secondary | ICD-10-CM

## 2017-03-05 DIAGNOSIS — K7689 Other specified diseases of liver: Secondary | ICD-10-CM | POA: Insufficient documentation

## 2017-03-05 DIAGNOSIS — I700 Atherosclerosis of aorta: Secondary | ICD-10-CM

## 2017-03-05 HISTORY — DX: Cyst of kidney, acquired: N28.1

## 2017-03-05 HISTORY — DX: Atherosclerosis of aorta: I70.0

## 2017-03-07 ENCOUNTER — Encounter: Payer: Self-pay | Admitting: Family Medicine

## 2017-03-07 ENCOUNTER — Ambulatory Visit (INDEPENDENT_AMBULATORY_CARE_PROVIDER_SITE_OTHER): Payer: Medicare Other | Admitting: Family Medicine

## 2017-03-07 DIAGNOSIS — I700 Atherosclerosis of aorta: Secondary | ICD-10-CM | POA: Diagnosis not present

## 2017-03-07 DIAGNOSIS — K5730 Diverticulosis of large intestine without perforation or abscess without bleeding: Secondary | ICD-10-CM

## 2017-03-07 DIAGNOSIS — G8929 Other chronic pain: Secondary | ICD-10-CM

## 2017-03-07 DIAGNOSIS — M4316 Spondylolisthesis, lumbar region: Secondary | ICD-10-CM | POA: Diagnosis not present

## 2017-03-07 DIAGNOSIS — R109 Unspecified abdominal pain: Secondary | ICD-10-CM | POA: Diagnosis not present

## 2017-03-07 DIAGNOSIS — E782 Mixed hyperlipidemia: Secondary | ICD-10-CM | POA: Diagnosis not present

## 2017-03-07 DIAGNOSIS — R7303 Prediabetes: Secondary | ICD-10-CM | POA: Diagnosis not present

## 2017-03-07 HISTORY — DX: Spondylolisthesis, lumbar region: M43.16

## 2017-03-07 NOTE — Assessment & Plan Note (Signed)
Discussed with patient; limit sweet beverages; check A1c in early July

## 2017-03-07 NOTE — Patient Instructions (Addendum)
We'll have you see the urologist Do watch your stools as you doing Return the stool cards at your earliest convenience Do let me know if urologist doesn't find anything so we can keep working to find out what's going on  Diverticulosis Diverticulosis is a condition that develops when small pouches (diverticula) form in the wall of the large intestine (colon). The colon is where water is absorbed and stool is formed. The pouches form when the inside layer of the colon pushes through weak spots in the outer layers of the colon. You may have a few pouches or many of them. What are the causes? The cause of this condition is not known. What increases the risk? The following factors may make you more likely to develop this condition:  Being older than age 67. Your risk for this condition increases with age. Diverticulosis is rare among people younger than age 55. By age 48, many people have it.  Eating a low-fiber diet.  Having frequent constipation.  Being overweight.  Not getting enough exercise.  Smoking.  Taking over-the-counter pain medicines, like aspirin and ibuprofen.  Having a family history of diverticulosis. What are the signs or symptoms? In most people, there are no symptoms of this condition. If you do have symptoms, they may include:  Bloating.  Cramps in the abdomen.  Constipation or diarrhea.  Pain in the lower left side of the abdomen. How is this diagnosed? This condition is most often diagnosed during an exam for other colon problems. Because diverticulosis usually has no symptoms, it often cannot be diagnosed independently. This condition may be diagnosed by:  Using a flexible scope to examine the colon (colonoscopy).  Taking an X-ray of the colon after dye has been put into the colon (barium enema).  Doing a CT scan. How is this treated? You may not need treatment for this condition if you have never developed an infection related to diverticulosis. If  you have had an infection before, treatment may include:  Eating a high-fiber diet. This may include eating more fruits, vegetables, and grains.  Taking a fiber supplement.  Taking a live bacteria supplement (probiotic).  Taking medicine to relax your colon.  Taking antibiotic medicines. Follow these instructions at home:  Drink 6-8 glasses of water or more each day to prevent constipation.  Try not to strain when you have a bowel movement.  If you have had an infection before:  Eat more fiber as directed by your health care provider or your diet and nutrition specialist (dietitian).  Take a fiber supplement or probiotic, if your health care provider approves.  Take over-the-counter and prescription medicines only as told by your health care provider.  If you were prescribed an antibiotic, take it as told by your health care provider. Do not stop taking the antibiotic even if you start to feel better.  Keep all follow-up visits as told by your health care provider. This is important. Contact a health care provider if:  You have pain in your abdomen.  You have bloating.  You have cramps.  You have not had a bowel movement in 3 days. Get help right away if:  Your pain gets worse.  Your bloating becomes very bad.  You have a fever or chills, and your symptoms suddenly get worse.  You vomit.  You have bowel movements that are bloody or black.  You have bleeding from your rectum. Summary  Diverticulosis is a condition that develops when small pouches (diverticula) form in  the wall of the large intestine (colon).  You may have a few pouches or many of them.  This condition is most often diagnosed during an exam for other colon problems.  If you have had an infection related to diverticulosis, treatment may include increasing the fiber in your diet, taking supplements, or taking medicines. This information is not intended to replace advice given to you by your  health care provider. Make sure you discuss any questions you have with your health care provider. Document Released: 08/08/2004 Document Revised: 09/30/2016 Document Reviewed: 09/30/2016 Elsevier Interactive Patient Education  2017 [employer].

## 2017-03-07 NOTE — Assessment & Plan Note (Signed)
Explained atherosclerosis of aorta using model, goal LDL less than 70; limit saturated fats

## 2017-03-07 NOTE — Progress Notes (Signed)
BP 122/76   Pulse 70   Temp 98.1 F (36.7 C) (Oral)   Resp 14   Wt 176 lb (79.8 kg)   SpO2 96%   BMI 30.21 kg/m    Subjective:    Patient ID: Holly Green, female    DOB: 11-17-1946, 70 y.o.   MRN: [id number]  HPI: Holly Green is a 71 y.o. female  Chief Complaint  Patient presents with  . Follow-up    2 week   HPI She is feeling okay today She is still having same pain after she urinates; not going as much in the afternoon Discomfort does not last a long time; hard to explain Right now she is not hurting; just feels like something is there Constantly drinking water No nausea, no loss of appetite The first time she gets up to urinate in the morning is the worst; pain is left side, flank She does not see blood in her urine She had GI bleed in 2015 and had blood transfusion, again in 2014; bleeding from diverticulosis; she watches her bowels all the time; last colonoscopy was 2015, Dr. Rayann Heman at [facility]; they did not need to see her back unless she had problems; she never had any pain with the diverticulitis; none of these urine sx with previous episodes  CT scan March 03, 2017: CLINICAL DATA:  Left lower quadrant abdominal pain and microscopic hematuria.  EXAM: CT ABDOMEN AND PELVIS WITH CONTRAST  TECHNIQUE: Multidetector CT imaging of the abdomen and pelvis was performed using the standard protocol following bolus administration of intravenous contrast.  CONTRAST:  153m ISOVUE-300 IOPAMIDOL (ISOVUE-300) INJECTION 61%  COMPARISON:  CT scan dated 07/22/2011  FINDINGS: Lower chest: Normal.  Hepatobiliary: 25 x 9 mm lobulated benign appearing cyst in the anterior aspect of the left lobe of the liver, essentially unchanged. Liver parenchyma is otherwise normal. Biliary tree is normal.  Pancreas: Unremarkable. No pancreatic ductal dilatation or surrounding inflammatory changes.  Spleen: Normal in size without focal  abnormality.  Adrenals/Urinary Tract: Adrenal glands are normal. 9 mm simple appearing cyst in the upper pole of the left kidney. Kidneys are otherwise normal. No hydronephrosis. Ureters and bladder appear normal.  Stomach/Bowel: Multiple diverticula throughout the colon. Stomach and small bowel appear normal. Terminal ileum and appendix are normal.  Vascular/Lymphatic: Aortic atherosclerosis. No enlarged abdominal or pelvic lymph nodes.  Reproductive:  Uterus has been removed.  Ovaries appear normal.  Other: No abdominal wall hernia or abnormality. No abdominopelvic ascites.  Musculoskeletal: No acute abnormality. Bilateral pars defects at L5 with grade 1 spondylolisthesis at L4-5 and L5-S1.  IMPRESSION: No acute abnormalities.  Numerous diverticula throughout the colon.  Aortic atherosclerosis.   Electronically Signed   By: JLorriane ShireM.D.   On: 03/03/2017 09:47  High cholesterol; on statin; last LDL 69; not many eggs; not many fatty meats, t[place]acon instead of regular bacon; ground t[place] of hamburger  Prediabetes; oldest brother had diabetes; father had diabetes in his older years after stroke Lab Results  Component Value Date   HGBA1C 6.0 (H) 11/22/2016    Patient was already aware that she had back issues  [facility] 03/07/2017 02/20/2017 11/22/2016 08/07/2016 05/07/2016  Decreased Interest 0 0 0 0 0  Down, Depressed, Hopeless 0 0 0 0 0  PHQ - 2 Score 0 0 0 0 0    Relevant past medical, surgical, family and social history reviewed Past Medical History:  Diagnosis Date  . Abdominal  aortic atherosclerosis ([place]) 03/05/2017   CT scan April 2018  . GERD (gastroesophageal reflux disease)   . Hyperlipidemia   . Hypertension   . Osteoporosis   . Prediabetes 10/15/2015  . Spondylolisthesis at L4-L5 level 03/07/2017   Past Surgical History:  Procedure Laterality Date  . ABDOMINAL HYSTERECTOMY     Family History  Problem  Relation Age of Onset  . Hypertension Brother   . Heart disease Brother   . Heart failure Brother   . Hypertension Mother   . Stroke Mother   . Stroke Father   . Aneurysm Sister   . Liver disease Maternal Aunt   . Heart failure Paternal Aunt   . Heart disease Paternal Uncle   . Heart attack Brother   . Heart disease Brother   . Cancer Maternal Aunt     breast caner ?  . Breast cancer Maternal Aunt   . Heart disease Paternal Aunt    Social History  Substance Use Topics  . Smoking status: Never Smoker  . Smokeless tobacco: Never Used  . Alcohol use No    Interim medical history since last visit reviewed. Allergies and medications reviewed  Review of Systems Per HPI unless specifically indicated above     Objective:    BP 122/76   Pulse 70   Temp 98.1 F (36.7 C) (Oral)   Resp 14   Wt 176 lb (79.8 kg)   SpO2 96%   BMI 30.21 kg/m   Wt Readings from Last 3 Encounters:  03/07/17 176 lb (79.8 kg)  02/20/17 180 lb 4 oz (81.8 kg)  11/22/16 182 lb (82.6 kg)    Physical Exam  Constitutional: She appears well-developed and well-nourished. No distress.  HENT:  Head: Normocephalic and atraumatic.  Eyes: EOM are normal. No scleral icterus.  Neck: No thyromegaly present.  Cardiovascular: Normal rate, regular rhythm and normal heart sounds.   No murmur heard. Pulmonary/Chest: Effort normal and breath sounds normal. She has no wheezes.  Abdominal: Soft. Bowel sounds are normal. She exhibits no distension and no mass. There is no tenderness. There is no guarding.  Musculoskeletal: Normal range of motion. She exhibits no edema.  Neurological: She is alert. She exhibits normal muscle tone.  Skin: Skin is warm and dry. She is not diaphoretic. No pallor.  Psychiatric: She has a normal mood and affect. Her behavior is normal. Judgment and thought content normal.   Results for orders placed or performed in visit on 02/20/17  Urine Culture  Result Value Ref Range   Organism  ID, Bacteria NO GROWTH   Hepatitis C Antibody  Result Value Ref Range   HCV Ab NEGATIVE NEGATIVE  COMPLETE METABOLIC PANEL WITH GFR  Result Value Ref Range   Sodium 140 135 - 146 mmol/L   Potassium 4.5 3.5 - 5.3 mmol/L   Chloride 102 98 - 110 mmol/L   CO2 28 20 - 31 mmol/L   Glucose, Bld 84 65 - 99 mg/dL   BUN 15 7 - 25 mg/dL   Creat 0.65 0.50 - 0.99 mg/dL   Total Bilirubin 1.3 (H) 0.2 - 1.2 mg/dL   Alkaline Phosphatase 88 33 - 130 U/L   AST 20 10 - 35 U/L   ALT 13 6 - 29 U/L   Total Protein 7.0 6.1 - 8.1 g/dL   Albumin 3.9 3.6 - 5.1 g/dL   Calcium 9.2 8.6 - 10.4 mg/dL   GFR, Est African American >89 >=60 mL/min   GFR, Est Non  African American >89 >=60 mL/min  CBC with Differential/Platelet  Result Value Ref Range   WBC 4.8 3.8 - 10.8 K/uL   RBC 4.06 3.80 - 5.10 MIL/uL   Hemoglobin 11.5 (L) 11.7 - 15.5 g/dL   HCT 35.5 35.0 - 45.0 %   MCV 87.4 80.0 - 100.0 fL   MCH 28.3 27.0 - 33.0 pg   MCHC 32.4 32.0 - 36.0 g/dL   RDW 14.6 11.0 - 15.0 %   Platelets 221 140 - 400 K/uL   MPV 8.5 7.5 - 12.5 fL   Neutro Abs 2,304 1,500 - 7,800 cells/uL   Lymphs Abs 1,968 850 - 3,900 cells/uL   Monocytes Absolute 432 200 - 950 cells/uL   Eosinophils Absolute 96 15 - 500 cells/uL   Basophils Absolute 0 0 - 200 cells/uL   Neutrophils Relative % 48 %   Lymphocytes Relative 41 %   Monocytes Relative 9 %   Eosinophils Relative 2 %   Basophils Relative 0 %   Smear Review Criteria for review not met   POCT urinalysis dipstick  Result Value Ref Range   Color, UA yellow    Clarity, UA clear    Glucose, UA neg    Bilirubin, UA negative    Ketones, UA negative    Spec Grav, UA 1.015 1.030 - 1.035   Blood, UA small    pH, UA 5.0 5.0 - 8.0   Protein, UA negative    Urobilinogen, UA negative Negative - 2.0   Nitrite, UA negative    Leukocytes, UA moderate (2+) (A) Negative      Assessment & Plan:   Problem List Items Addressed This Visit      Cardiovascular and Mediastinum   Abdominal  aortic atherosclerosis ([place])    discussed results with patient of CT scan in detail; showed model of artery at various stages of atherosclerosis; need to get LDL under 70        Digestive   Diverticulosis of colon, acquired    Patient has had GI bleeds from this in the past; stool cards given; see AVS        Musculoskeletal and Integument   Spondylolisthesis at L4-L5 level    Noted on CT scan; patient aware        Other   Prediabetes    Discussed with patient; limit sweet beverages; check A1c in early July      Left flank pain, chronic    3-4 months duration; reviewed CT scan report in detail; will refer to urologist for next step      Relevant Orders   Ambulatory referral to Urology   Hyperlipidemia (Chronic)    Explained atherosclerosis of aorta using model, goal LDL less than 70; limit saturated fats          Follow up plan: Return in about 3 months (around 05/27/2017) for twenty minute follow-up with fasting labs.  An after-visit summary was printed and given to the patient at [facility].  Please see the patient instructions which may contain other information and recommendations beyond what is mentioned above in the assessment and plan.  No orders of the defined types were placed in this encounter.   Orders Placed This Encounter  Procedures  . Ambulatory referral to Urology

## 2017-03-07 NOTE — Assessment & Plan Note (Signed)
Noted on CT scan; patient aware

## 2017-03-07 NOTE — Assessment & Plan Note (Signed)
Patient has had GI bleeds from this in the past; stool cards given; see AVS

## 2017-03-07 NOTE — Assessment & Plan Note (Addendum)
discussed results with patient of CT scan in detail; showed model of artery at various stages of atherosclerosis; need to get LDL under 70

## 2017-03-07 NOTE — Assessment & Plan Note (Signed)
3-4 months duration; reviewed CT scan report in detail; will refer to urologist for next step

## 2017-03-13 ENCOUNTER — Other Ambulatory Visit: Payer: Self-pay

## 2017-03-13 DIAGNOSIS — K5730 Diverticulosis of large intestine without perforation or abscess without bleeding: Secondary | ICD-10-CM

## 2017-03-13 LAB — POC HEMOCCULT BLD/STL (HOME/3-CARD/SCREEN)
Card #2 Fecal Occult Blod, POC: NEGATIVE
Card #3 Fecal Occult Blood, POC: NEGATIVE
Fecal Occult Blood, POC: NEGATIVE

## 2017-03-18 ENCOUNTER — Ambulatory Visit
Admission: RE | Admit: 2017-03-18 | Discharge: 2017-03-18 | Disposition: A | Discharge status: Home / Self Care | Payer: Medicare Other | Source: Ambulatory Visit | Attending: Family Medicine | Admitting: Family Medicine

## 2017-03-18 DIAGNOSIS — Z1239 Encounter for other screening for malignant neoplasm of breast: Secondary | ICD-10-CM

## 2017-03-18 DIAGNOSIS — Z1231 Encounter for screening mammogram for malignant neoplasm of breast: Secondary | ICD-10-CM | POA: Insufficient documentation

## 2017-03-24 NOTE — Progress Notes (Signed)
03/25/2017 9:46 AM   Holly Green 28-Jun-1946 [id number]  Referring provider: Arnetha Courser, MD [address] [place], [place] [address]  Chief Complaint  Patient presents with  . New Patient (Initial Visit)    L Flank pain referred by Enid Derry    HPI: Patient is a 71 year old African American female who is referred by Dr. Arnetha Courser for chronic suprapubic pain.    She states she has pain x 3 months.  It is located in the suprapubic area and radiates to her sides with the left > right.  She describes the pain as cramping in sensation.  At first, the pain occurred after urination.  The pain is now occurring with the urge to urinate, but it is still worse after urinating.    She was given an antibiotics on 02/20/2017 with no relief of symptoms.  She denies gross hematuria and dysuria.  She denies fevers, chills, nausea and vomiting.  Her UA today is unremarkable.  Her PVR is 73 mL.    She has nocturia x 3, frequency x 4-8, urge incontinence x 0-3.    Constantly drinking water.  Drinking 1 to 2 sodas a week.  She drinks mango twist juice or peach juice frequently.    The first time she gets up to urinate in the morning is the worst; pain is left side, flank.  She denies constipation.   Contrast CT performed on 03/03/2017 noted no acute abnormalities.  Numerous diverticula throughout the colon.  Aortic atherosclerosis.  I have independently reviewed the films.    She has noted a vaginal bulge for years.    PMH: Past Medical History:  Diagnosis Date  . Abdominal aortic atherosclerosis ([place]) 03/05/2017   CT scan April 2018  . Arthritis   . Bleeding disorder ([place])   . GERD (gastroesophageal reflux disease)   . History of stomach ulcers   . Hyperlipidemia   . Hypertension   . Osteoporosis   . Prediabetes 10/15/2015  . Spondylolisthesis at L4-L5 level 03/07/2017    Surgical History: Past Surgical History:  Procedure Laterality Date  . ABDOMINAL  HYSTERECTOMY    . BACK SURGERY  2010  . BREAST CYST ASPIRATION Right 1990's  . FOOT SURGERY      Home Medications:  Allergies as of 03/25/2017   No Known Allergies     Medication List       Accurate as of 03/25/17  9:46 AM. Always use your most recent med list.          bisoprolol-hydrochlorothiazide 10-6.25 MG tablet Commonly known as:  ZIAC Take 1 tablet by mouth daily.   Calcium-Vitamin D 600-400 MG-UNIT Tabs Take 1 tablet by mouth daily.   lovastatin 20 MG tablet Commonly known as:  MEVACOR Take 1 tablet (20 mg total) by mouth at bedtime.   ranitidine 150 MG tablet Commonly known as:  ZANTAC Take 1 tablet (150 mg total) by mouth 2 (two) times daily. If needed for heartburn, acid reflux       Allergies: No Known Allergies  Family History: Family History  Problem Relation Age of Onset  . Hypertension Brother   . Heart disease Brother   . Heart failure Brother   . Hypertension Mother   . Stroke Mother   . Stroke Father   . Prostate cancer Father   . Aneurysm Sister   . Breast cancer Sister 19  . Liver disease Maternal Aunt   . Heart  failure Paternal Aunt   . Heart disease Paternal Uncle   . Heart attack Brother   . Heart disease Brother   . Breast cancer Maternal Aunt   . Heart disease Paternal Aunt   . Kidney cancer Neg Hx   . Bladder Cancer Neg Hx     Social History:  reports that she has quit smoking. She has never used smokeless tobacco. She reports that she does not drink alcohol or use drugs.  ROS: UROLOGY Frequent Urination?: Yes Hard to postpone urination?: Yes Burning/pain with urination?: No Get up at night to urinate?: Yes Leakage of urine?: Yes Urine stream starts and stops?: No Trouble starting stream?: No Do you have to strain to urinate?: No Blood in urine?: Yes Urinary tract infection?: Yes Sexually transmitted disease?: No Injury to kidneys or bladder?: No Painful intercourse?: No Weak stream?: No Currently pregnant?:  No Vaginal bleeding?: No Last menstrual period?: n  Gastrointestinal Nausea?: No Vomiting?: No Indigestion/heartburn?: Yes Diarrhea?: No Constipation?: No  Constitutional Fever: No Night sweats?: Yes Weight loss?: No Fatigue?: Yes  Skin Skin rash/lesions?: Yes Itching?: Yes  Eyes Blurred vision?: No Double vision?: No  Ears/Nose/Throat Sore throat?: No Sinus problems?: Yes  Hematologic/Lymphatic Swollen glands?: No Easy bruising?: Yes  Cardiovascular Leg swelling?: No Chest pain?: No  Respiratory Cough?: Yes Shortness of breath?: No  Endocrine Excessive thirst?: No  Musculoskeletal Back pain?: Yes Joint pain?: Yes  Neurological Headaches?: No Dizziness?: No  Psychologic Depression?: No Anxiety?: No  Physical Exam: BP (!) 182/100   Pulse 71   Ht 5\' 4"  (1.626 m)   Wt 177 lb 4.8 oz (80.4 kg)   BMI 30.43 kg/m   Constitutional: Well nourished. Alert and oriented, No acute distress. HEENT: [place] AT, moist mucus membranes. Trachea midline, no masses. Cardiovascular: No clubbing, cyanosis, or edema. Respiratory: Normal respiratory effort, no increased work of breathing. GI: Abdomen is soft, non tender, non distended, no abdominal masses. Liver and spleen not palpable.  No hernias appreciated.  Stool sample for occult testing is not indicated.   GU: No CVA tenderness.  No bladder fullness or masses.  Atrophic external genitalia, normal pubic hair distribution, no lesions.  Normal urethral meatus, no lesions, no prolapse, no discharge.   No urethral masses, tenderness and/or tenderness. No bladder fullness, tenderness or masses. Pale vagina mucosa, poor estrogen effect, no discharge, no lesions, poor pelvic support, Grade I cystocele is noted.  Rectocele is noted.  Cervix, uterus and adnexa are surgically absent.  Anus and perineum are without rashes or lesions.    Skin: No rashes, bruises or suspicious lesions. Lymph: No cervical or inguinal  adenopathy. Neurologic: Grossly intact, no focal deficits, moving all 4 extremities. Psychiatric: Normal mood and affect.  Laboratory Data: Lab Results  Component Value Date   WBC 4.8 02/20/2017   HGB 11.5 (L) 02/20/2017   HCT 35.5 02/20/2017   MCV 87.4 02/20/2017   PLT 221 02/20/2017    Lab Results  Component Value Date   CREATININE 0.65 02/20/2017     Lab Results  Component Value Date   HGBA1C 6.0 (H) 11/22/2016    Lab Results  Component Value Date   TSH 1.770 10/12/2015       Component Value Date/Time   CHOL 171 11/22/2016 1146   CHOL 175 05/07/2016 0930   HDL 95 11/22/2016 1146   HDL 86 05/07/2016 0930   CHOLHDL 1.8 11/22/2016 1146   VLDL 7 11/22/2016 1146   LDLCALC 69 11/22/2016 1146  LDLCALC 79 05/07/2016 0930    Lab Results  Component Value Date   AST 20 02/20/2017   Lab Results  Component Value Date   ALT 13 02/20/2017     Urinalysis Unremarkable.  See EPIC.    Pertinent Imaging: CLINICAL DATA:  Left lower quadrant abdominal pain and microscopic hematuria.  EXAM: CT ABDOMEN AND PELVIS WITH CONTRAST  TECHNIQUE: Multidetector CT imaging of the abdomen and pelvis was performed using the standard protocol following bolus administration of intravenous contrast.  CONTRAST:  151mL ISOVUE-300 IOPAMIDOL (ISOVUE-300) INJECTION 61%  COMPARISON:  CT scan dated 07/22/2011  FINDINGS: Lower chest: Normal.  Hepatobiliary: 25 x 9 mm lobulated benign appearing cyst in the anterior aspect of the left lobe of the liver, essentially unchanged. Liver parenchyma is otherwise normal. Biliary tree is normal.  Pancreas: Unremarkable. No pancreatic ductal dilatation or surrounding inflammatory changes.  Spleen: Normal in size without focal abnormality.  Adrenals/Urinary Tract: Adrenal glands are normal. 9 mm simple appearing cyst in the upper pole of the left kidney. Kidneys are otherwise normal. No hydronephrosis. Ureters and bladder  appear normal.  Stomach/Bowel: Multiple diverticula throughout the colon. Stomach and small bowel appear normal. Terminal ileum and appendix are normal.  Vascular/Lymphatic: Aortic atherosclerosis. No enlarged abdominal or pelvic lymph nodes.  Reproductive:  Uterus has been removed.  Ovaries appear normal.  Other: No abdominal wall hernia or abnormality. No abdominopelvic ascites.  Musculoskeletal: No acute abnormality. Bilateral pars defects at L5 with grade 1 spondylolisthesis at L4-5 and L5-S1.  IMPRESSION: No acute abnormalities.  Numerous diverticula throughout the colon.  Aortic atherosclerosis.   Electronically Signed   By: Lorriane Shire M.D.   On: 03/03/2017 09:47  Assessment & Plan:    1. Suprapubic pain  - explained to the patient that this may be interstitial cystitis but it's not certain as her pelvic exam did cause her pain  - it does presents with constellation of symptoms including urinary frequency, urgency, suprapubic pressure, and bladder or pelvic pains  - it is a clinical diagnosis of exclusion: must have negative culture studies, no inciting irritating agents to bladder and without bladder neoplasia.  - will have a trial of an OAB agent , Vesicare 5 mg daily,  I have advised of the side effects such as: Dry eyes, dry mouth, constipation, mental confusion and/or urinary retention  - RTC in 3 weeks for OAB questionnaire and PVR - if no relief will consider cystoscopy  2. Vaginal atrophy  - explained to the patient that her menopausal state causes her vaginal tissue to become dry, inflamed and thin and this may be contributing to her discomfort  - Patient was given a sample of vaginal estrogen cream (Premarin) and instructed to apply 0.5mg  (pea-sized amount)  just inside the vaginal introitus with a finger-tip every night for two weeks and then Monday, Wednesday and Friday nights.  I explained to the patient that vaginally administered estrogen,  which causes only a slight increase in the blood estrogen levels, have fewer contraindications and adverse systemic effects that oral HT.   - RTC in 3 weeks for exam and symptom recheck    Return in about 3 weeks (around 04/15/2017) for OAB questionnaire, PVR and exam.  These notes generated with voice recognition software. I apologize for typographical errors.  Zara Council, [facility] [address], [place], [place] [address] [phone]

## 2017-03-25 ENCOUNTER — Encounter: Payer: Self-pay | Admitting: Urology

## 2017-03-25 ENCOUNTER — Ambulatory Visit (INDEPENDENT_AMBULATORY_CARE_PROVIDER_SITE_OTHER): Payer: Medicare Other | Admitting: Urology

## 2017-03-25 VITALS — BP 182/100 | HR 71 | Ht 64.0 in | Wt 177.3 lb

## 2017-03-25 DIAGNOSIS — R102 Pelvic and perineal pain: Secondary | ICD-10-CM | POA: Diagnosis not present

## 2017-03-25 DIAGNOSIS — N952 Postmenopausal atrophic vaginitis: Secondary | ICD-10-CM | POA: Diagnosis not present

## 2017-03-25 DIAGNOSIS — R109 Unspecified abdominal pain: Secondary | ICD-10-CM | POA: Diagnosis not present

## 2017-03-25 LAB — URINALYSIS, COMPLETE
Bilirubin, UA: NEGATIVE
Glucose, UA: NEGATIVE
Ketones, UA: NEGATIVE
Leukocytes, UA: NEGATIVE
Nitrite, UA: NEGATIVE
Protein, UA: NEGATIVE
RBC, UA: NEGATIVE
Specific Gravity, UA: 1.015 (ref 1.005–1.030)
Urobilinogen, Ur: 0.2 mg/dL (ref 0.2–1.0)
pH, UA: 6 (ref 5.0–7.5)

## 2017-03-25 LAB — MICROSCOPIC EXAMINATION
RBC, UA: NONE SEEN /hpf (ref 0–?)
WBC, UA: NONE SEEN /hpf (ref 0–?)

## 2017-03-25 LAB — BLADDER SCAN AMB NON-IMAGING: Scan Result: 73

## 2017-04-22 NOTE — Progress Notes (Signed)
04/24/2017 10:33 AM   Holly Green 08/06/1946 [id number]  Referring provider: Arnetha Courser, MD [address] [place], [place] [address]  Chief Complaint  Patient presents with  . Follow-up    3 weeks f/u suprapubic pain / vaginal atrophy    HPI: 71 yo AAF who presents today for a three week follow up after starting Vesicare 5 mg daily and Premarin cream for vaginal atrophy.  Background history Patient is a 71 year old African American female who is referred by Dr. Arnetha Courser for chronic suprapubic pain.  She states she has pain x 3 months.  It is located in the suprapubic area and radiates to her sides with the left > right.  She describes the pain as cramping in sensation.  At first, the pain occurred after urination.  The pain is now occurring with the urge to urinate, but it is still worse after urinating.  She was given an antibiotics on 02/20/2017 with no relief of symptoms.  She denies gross hematuria and dysuria.  She denies fevers, chills, nausea and vomiting.  Her UA today is unremarkable.  Her PVR is 73 mL.  She has nocturia x 3, frequency x 4-8, urge incontinence x 0-3.  Constantly drinking water.  Drinking 1 to 2 sodas a week.  She drinks mango twist juice or peach juice frequently.  The first time she gets up to urinate in the morning is the worst; pain is left side, flank.  She denies constipation.  Contrast CT performed on 03/03/2017 noted no acute abnormalities.  Numerous diverticula throughout the colon.  Aortic atherosclerosis.  I have independently reviewed the films.  She has noted a vaginal bulge for years.    At her visit three weeks ago, she was complaining of suprapubic pain.  She did not have bladder pain on pelvic exam, but she was having urinary symptoms.  She was started on Vesicare to see if this reduced or eliminated her suprapubic pain and/or LU TS.   She was also found to have vaginal atrophy and was started on vaginal estrogen cream.     Today, the patient has been experiencing urgency x 0-3 (stable), frequency x 4-7 (stable), is restricting fluids to avoid visits to the restroom, is engaging in toilet mapping, incontinence x 0-3 (stable) and nocturia x 0-3 (stable).   Her PVR is 105 mL.    She states she used the applicator with the vaginal cream and has ran out of the samples.    She states the Vesicare caused extreme dry mouth and she couldn't tolerate the medication.    She is still experiencing the left sided pain and states that it gets worse after urination.  She is having to urinate every hour when the pain is present.  She describes the pain as sharp and stabbing.  She has not had gross hematuria.  She has had flank pain on the left as well.  She has a remote history of stones.  Her UA today is unremarkable.    PMH: Past Medical History:  Diagnosis Date  . Abdominal aortic atherosclerosis ([place]) 03/05/2017   CT scan April 2018  . Arthritis   . Bleeding disorder ([place])   . GERD (gastroesophageal reflux disease)   . History of stomach ulcers   . Hyperlipidemia   . Hypertension   . Osteoporosis   . Prediabetes 10/15/2015  . Spondylolisthesis at L4-L5 level 03/07/2017    Surgical History: Past Surgical History:  Procedure Laterality Date  . ABDOMINAL HYSTERECTOMY    . BACK SURGERY  2010  . BREAST CYST ASPIRATION Right 1990's  . FOOT SURGERY      Home Medications:  Allergies as of 04/24/2017   No Known Allergies     Medication List       Accurate as of 04/24/17 10:33 AM. Always use your most recent med list.          bisoprolol-hydrochlorothiazide 10-6.25 MG tablet Commonly known as:  ZIAC Take 1 tablet by mouth daily.   Calcium-Vitamin D 600-400 MG-UNIT Tabs Take 1 tablet by mouth daily.   lovastatin 20 MG tablet Commonly known as:  MEVACOR Take 1 tablet (20 mg total) by mouth at bedtime.   ranitidine 150 MG tablet Commonly known as:  ZANTAC Take 1 tablet (150 mg total) by mouth 2 (two)  times daily. If needed for heartburn, acid reflux       Allergies: No Known Allergies  Family History: Family History  Problem Relation Age of Onset  . Hypertension Brother   . Heart disease Brother   . Heart failure Brother   . Hypertension Mother   . Stroke Mother   . Stroke Father   . Prostate cancer Father   . Aneurysm Sister   . Breast cancer Sister 64  . Liver disease Maternal Aunt   . Heart failure Paternal Aunt   . Heart disease Paternal Uncle   . Heart attack Brother   . Heart disease Brother   . Breast cancer Maternal Aunt   . Heart disease Paternal Aunt   . Kidney cancer Neg Hx   . Bladder Cancer Neg Hx     Social History:  reports that she has quit smoking. She has never used smokeless tobacco. She reports that she does not drink alcohol or use drugs.  ROS: UROLOGY Frequent Urination?: Yes Hard to postpone urination?: No Burning/pain with urination?: No Get up at night to urinate?: No Leakage of urine?: No Urine stream starts and stops?: No Trouble starting stream?: No Do you have to strain to urinate?: No Blood in urine?: No Urinary tract infection?: No Sexually transmitted disease?: No Injury to kidneys or bladder?: No Painful intercourse?: No Weak stream?: No Currently pregnant?: No Vaginal bleeding?: No Last menstrual period?: n  Gastrointestinal Nausea?: No Vomiting?: No Indigestion/heartburn?: No Diarrhea?: No Constipation?: No  Constitutional Fever: No Night sweats?: No Weight loss?: No Fatigue?: No  Skin Skin rash/lesions?: No Itching?: Yes  Eyes Blurred vision?: No Double vision?: No  Ears/Nose/Throat Sore throat?: No Sinus problems?: Yes  Hematologic/Lymphatic Swollen glands?: No Easy bruising?: No  Cardiovascular Leg swelling?: No Chest pain?: No  Respiratory Cough?: No Shortness of breath?: No  Endocrine Excessive thirst?: No  Musculoskeletal Back pain?: Yes Joint pain?:  No  Neurological Headaches?: No Dizziness?: No  Psychologic Depression?: No Anxiety?: No  Physical Exam: BP (!) 177/93   Pulse 66   Ht 5\' 4"  (1.626 m)   Wt 178 lb 14.4 oz (81.1 kg)   BMI 30.71 kg/m   Constitutional: Well nourished. Alert and oriented, No acute distress. HEENT:  AT, moist mucus membranes. Trachea midline, no masses. Cardiovascular: No clubbing, cyanosis, or edema. Respiratory: Normal respiratory effort, no increased work of breathing. Skin: No rashes, bruises or suspicious lesions. Lymph: No cervical or inguinal adenopathy. Neurologic: Grossly intact, no focal deficits, moving all 4 extremities. Psychiatric: Normal mood and affect.  Laboratory Data: Lab Results  Component Value Date   WBC 4.8 02/20/2017  HGB 11.5 (L) 02/20/2017   HCT 35.5 02/20/2017   MCV 87.4 02/20/2017   PLT 221 02/20/2017    Lab Results  Component Value Date   CREATININE 0.65 02/20/2017     Lab Results  Component Value Date   HGBA1C 6.0 (H) 11/22/2016    Lab Results  Component Value Date   TSH 1.770 10/12/2015       Component Value Date/Time   CHOL 171 11/22/2016 1146   CHOL 175 05/07/2016 0930   HDL 95 11/22/2016 1146   HDL 86 05/07/2016 0930   CHOLHDL 1.8 11/22/2016 1146   VLDL 7 11/22/2016 1146   LDLCALC 69 11/22/2016 1146   LDLCALC 79 05/07/2016 0930    Lab Results  Component Value Date   AST 20 02/20/2017   Lab Results  Component Value Date   ALT 13 02/20/2017   Urinalysis Unremarkable.  See EPIC.    Pertinent Imaging: CLINICAL DATA:  Left lower quadrant abdominal pain and microscopic hematuria.  EXAM: CT ABDOMEN AND PELVIS WITH CONTRAST  TECHNIQUE: Multidetector CT imaging of the abdomen and pelvis was performed using the standard protocol following bolus administration of intravenous contrast.  CONTRAST:  197mL ISOVUE-300 IOPAMIDOL (ISOVUE-300) INJECTION 61%  COMPARISON:  CT scan dated 07/22/2011  FINDINGS: Lower chest:  Normal.  Hepatobiliary: 25 x 9 mm lobulated benign appearing cyst in the anterior aspect of the left lobe of the liver, essentially unchanged. Liver parenchyma is otherwise normal. Biliary tree is normal.  Pancreas: Unremarkable. No pancreatic ductal dilatation or surrounding inflammatory changes.  Spleen: Normal in size without focal abnormality.  Adrenals/Urinary Tract: Adrenal glands are normal. 9 mm simple appearing cyst in the upper pole of the left kidney. Kidneys are otherwise normal. No hydronephrosis. Ureters and bladder appear normal.  Stomach/Bowel: Multiple diverticula throughout the colon. Stomach and small bowel appear normal. Terminal ileum and appendix are normal.  Vascular/Lymphatic: Aortic atherosclerosis. No enlarged abdominal or pelvic lymph nodes.  Reproductive:  Uterus has been removed.  Ovaries appear normal.  Other: No abdominal wall hernia or abnormality. No abdominopelvic ascites.  Musculoskeletal: No acute abnormality. Bilateral pars defects at L5 with grade 1 spondylolisthesis at L4-5 and L5-S1.  IMPRESSION: No acute abnormalities.  Numerous diverticula throughout the colon.  Aortic atherosclerosis.   Electronically Signed   By: Lorriane Shire M.D.   On: 03/03/2017 09:47  Assessment & Plan:    1. Suprapubic pain  - explained to the patient that this may be interstitial cystitis but it's not certain as her pelvic exam did cause her pain  - it does presents with constellation of symptoms including urinary frequency, urgency, suprapubic pressure, and bladder or pelvic pains  - it is a clinical diagnosis of exclusion: must have negative culture studies, no inciting irritating agents to bladder and without bladder neoplasia.  - Vesicare did not provide relief  - she is also complaining of left flank pain  2. Left flank pain  - obtain a CT Renal stone study  - Advised to contact our office or seek treatment in the ED if  becomes febrile or pain/ vomiting are difficult control in order to arrange for emergent/urgent intervention  3. Vaginal atrophy  - given another sample of Premarin cream and re instructed on how to apply   Return for CT Renal stone report.  These notes generated with voice recognition software. I apologize for typographical errors.  Zara Council, [facility] [address], [place], [place] [address] [phone]

## 2017-04-24 ENCOUNTER — Encounter: Payer: Self-pay | Admitting: Urology

## 2017-04-24 ENCOUNTER — Ambulatory Visit (INDEPENDENT_AMBULATORY_CARE_PROVIDER_SITE_OTHER): Payer: Medicare Other | Admitting: Urology

## 2017-04-24 VITALS — BP 177/93 | HR 66 | Ht 64.0 in | Wt 178.9 lb

## 2017-04-24 DIAGNOSIS — N952 Postmenopausal atrophic vaginitis: Secondary | ICD-10-CM

## 2017-04-24 DIAGNOSIS — R109 Unspecified abdominal pain: Secondary | ICD-10-CM | POA: Diagnosis not present

## 2017-04-24 DIAGNOSIS — R102 Pelvic and perineal pain: Secondary | ICD-10-CM

## 2017-04-24 LAB — URINALYSIS, COMPLETE
Bilirubin, UA: NEGATIVE
Glucose, UA: NEGATIVE
Ketones, UA: NEGATIVE
Leukocytes, UA: NEGATIVE
Nitrite, UA: NEGATIVE
Protein, UA: NEGATIVE
Specific Gravity, UA: 1.015 (ref 1.005–1.030)
Urobilinogen, Ur: 0.2 mg/dL (ref 0.2–1.0)
pH, UA: 7 (ref 5.0–7.5)

## 2017-04-24 LAB — MICROSCOPIC EXAMINATION
Bacteria, UA: NONE SEEN
RBC, UA: NONE SEEN /hpf (ref 0–?)

## 2017-04-24 LAB — BLADDER SCAN AMB NON-IMAGING: Scan Result: 105

## 2017-05-07 ENCOUNTER — Ambulatory Visit
Admission: RE | Admit: 2017-05-07 | Discharge: 2017-05-07 | Disposition: A | Discharge status: Home / Self Care | Payer: Medicare Other | Source: Ambulatory Visit | Attending: Urology | Admitting: Urology

## 2017-05-07 DIAGNOSIS — R109 Unspecified abdominal pain: Secondary | ICD-10-CM

## 2017-05-07 DIAGNOSIS — K5730 Diverticulosis of large intestine without perforation or abscess without bleeding: Secondary | ICD-10-CM | POA: Diagnosis not present

## 2017-05-07 DIAGNOSIS — I700 Atherosclerosis of aorta: Secondary | ICD-10-CM | POA: Insufficient documentation

## 2017-05-18 NOTE — Progress Notes (Signed)
05/19/2017 10:53 AM   [name] 08-Aug-1946 [id number]  Referring provider: Arnetha Courser, MD [address] [place], [place] [address]  Chief Complaint  Patient presents with  . Results    CT     HPI: 71 yo AAF who presents today for to discuss her CT scan results.    Background history Patient is a 71 year old African American female who is referred by Dr. Arnetha Courser for chronic suprapubic pain.  She states she has pain x 3 months.  It is located in the suprapubic area and radiates to her sides with the left > right.  She describes the pain as cramping in sensation.  At first, the pain occurred after urination.  The pain is now occurring with the urge to urinate, but it is still worse after urinating.  She was given an antibiotics on 02/20/2017 with no relief of symptoms.  She denies gross hematuria and dysuria.  She denies fevers, chills, nausea and vomiting.  Her UA today is unremarkable.  Her PVR is 73 mL.  She has nocturia x 3, frequency x 4-8, urge incontinence x 0-3.  Constantly drinking water.  Drinking 1 to 2 sodas a week.  She drinks mango twist juice or peach juice frequently.  The first time she gets up to urinate in the morning is the worst; pain is left side, flank.  She denies constipation.  Contrast CT performed on 03/03/2017 noted no acute abnormalities.  Numerous diverticula throughout the colon.  Aortic atherosclerosis.  I have independently reviewed the films.  She has noted a vaginal bulge for years.    Interval history At her visit 71 three weeks ago, she was complaining of suprapubic pain.  She did not have bladder pain on pelvic exam, but she was having urinary symptoms.  She was started on Vesicare to see if this reduced or eliminated her suprapubic pain and/or LU TS.   She was also found to have vaginal atrophy and was started on vaginal estrogen cream.   Today, the patient has been experiencing urgency x 0-3 (stable), frequency x 4-7 (stable), is  restricting fluids to avoid visits to the restroom, is engaging in toilet mapping, incontinence x 0-3 (stable) and nocturia x 0-3 (stable).   Her PVR is 105 mL.   She states she used the applicator with the vaginal cream and has ran out of the samples.    She states the Vesicare caused extreme dry mouth and she couldn't tolerate the medication.    She is still experiencing the left sided pain and states that it gets worse after urination.  She is having to urinate every hour when the pain is present.  She describes the pain as sharp and stabbing.  She has not had gross hematuria.  She has had flank pain on the left as well.  She has a remote history of stones.  Her UA today is unremarkable.    CT Renal stone study performed on 05/07/2017 noted no acute findings are noted in the abdomen or pelvis to account for the patient's symptoms. Specifically, no urinary tract calculi no findings of urinary tract obstruction are noted at this time.  Extensive colonic diverticulosis without surrounding inflammatory changes to suggest an acute diverticulitis at this time.  Normal appendix.  Aortic atherosclerosis.  Additional incidental findings, as above.  I have independently reviewed the films.  Today, she is experiencing frequency of urination, pain in her stomach when urinating and nocturia.  She is still experiencing left sided suprapubic pain that radiates into her left lower quadrant.  She states the pain is present when she has the urge to urinate and intensifies once she empties her bladder.  She denies gross hematuria and has not had AMH on the last two UA's with [name].  She is not having issues with her BM's.  The pain is 10/10 at times and lasts for several minutes.  The worse times are in the morning.     PMH: Past Medical History:  Diagnosis Date  . Abdominal aortic atherosclerosis ([place]) 03/05/2017   CT scan April 2018  . Arthritis   . Bleeding disorder ([place])   . GERD (gastroesophageal reflux disease)   .  History of stomach ulcers   . Hyperlipidemia   . Hypertension   . Osteoporosis   . Prediabetes 10/15/2015  . Spondylolisthesis at L4-L5 level 03/07/2017    Surgical History: Past Surgical History:  Procedure Laterality Date  . ABDOMINAL HYSTERECTOMY    . BACK SURGERY  2010  . BREAST CYST ASPIRATION Right 1990's  . FOOT SURGERY      Home Medications:  Allergies as of 05/19/2017   No Known Allergies     Medication List       Accurate as of 05/19/17 10:53 AM. Always use your most recent med list.          bisoprolol-hydrochlorothiazide 10-6.25 MG tablet Commonly known as:  ZIAC Take 1 tablet by mouth daily.   Calcium-Vitamin D 600-400 MG-UNIT Tabs Take 1 tablet by mouth daily.   lovastatin 20 MG tablet Commonly known as:  MEVACOR Take 1 tablet (20 mg total) by mouth at bedtime.   ranitidine 150 MG tablet Commonly known as:  ZANTAC Take 1 tablet (150 mg total) by mouth 2 (two) times daily. If needed for heartburn, acid reflux       Allergies: No Known Allergies  Family History: Family History  Problem Relation Age of Onset  . Hypertension Brother   . Heart disease Brother   . Heart failure Brother   . Hypertension Mother   . Stroke Mother   . Stroke Father   . Prostate cancer Father   . Aneurysm Sister   . Breast cancer Sister 40  . Liver disease Maternal Aunt   . Heart failure Paternal Aunt   . Heart disease Paternal Uncle   . Heart attack Brother   . Heart disease Brother   . Breast cancer Maternal Aunt   . Heart disease Paternal Aunt   . Kidney cancer Neg Hx   . Bladder Cancer Neg Hx     Social History:  reports that she has quit smoking. She has never used smokeless tobacco. She reports that she does not drink alcohol or use drugs.  ROS: UROLOGY Frequent Urination?: Yes Hard to postpone urination?: No Burning/pain with urination?: Yes Get up at night to urinate?: Yes Leakage of urine?: No Urine stream starts and stops?: No Trouble  starting stream?: No Do you have to strain to urinate?: No Blood in urine?: No Urinary tract infection?: No Sexually transmitted disease?: No Injury to kidneys or bladder?: No Painful intercourse?: No Weak stream?: No Currently pregnant?: No Vaginal bleeding?: No Last menstrual period?: n  Gastrointestinal Nausea?: No Vomiting?: No Indigestion/heartburn?: No Diarrhea?: No Constipation?: No  Constitutional Fever: No Night sweats?: Yes Weight loss?: No Fatigue?: Yes  Skin Skin rash/lesions?: No Itching?: Yes  Eyes Blurred vision?: No Double vision?: No  Ears/Nose/Throat Sore throat?:  No Sinus problems?: No  Hematologic/Lymphatic Swollen glands?: No Easy bruising?: No  Cardiovascular Leg swelling?: No Chest pain?: No  Respiratory Cough?: No Shortness of breath?: No  Endocrine Excessive thirst?: No  Musculoskeletal Back pain?: Yes Joint pain?: Yes  Neurological Headaches?: No Dizziness?: No  Psychologic Depression?: No Anxiety?: No  Physical Exam: BP (!) 181/98   Pulse 63   Ht 5\' 6"  (1.676 m)   Wt 176 lb 6.4 oz (80 kg)   BMI 28.47 kg/m   Constitutional: Well nourished. Alert and oriented, No acute distress. HEENT: [place] AT, moist mucus membranes. Trachea midline, no masses. Cardiovascular: No clubbing, cyanosis, or edema. Respiratory: Normal respiratory effort, no increased work of breathing. Skin: No rashes, bruises or suspicious lesions. Lymph: No cervical or inguinal adenopathy. Neurologic: Grossly intact, no focal deficits, moving all 4 extremities. Psychiatric: Normal mood and affect.  Laboratory Data: Lab Results  Component Value Date   WBC 4.8 02/20/2017   HGB 11.5 (L) 02/20/2017   HCT 35.5 02/20/2017   MCV 87.4 02/20/2017   PLT 221 02/20/2017    Lab Results  Component Value Date   CREATININE 0.65 02/20/2017     Lab Results  Component Value Date   HGBA1C 6.0 (H) 11/22/2016        Component Value Date/Time   CHOL  171 11/22/2016 1146   CHOL 175 05/07/2016 0930   HDL 95 11/22/2016 1146   HDL 86 05/07/2016 0930   CHOLHDL 1.8 11/22/2016 1146   VLDL 7 11/22/2016 1146   LDLCALC 69 11/22/2016 1146   LDLCALC 79 05/07/2016 0930    Lab Results  Component Value Date   AST 20 02/20/2017   Lab Results  Component Value Date   ALT 13 02/20/2017    Pertinent Imaging: CLINICAL DATA:  71 year old female with history of severe left flank pain while urinating for the past 2-3 months. Urinary frequency. Microscopic hematuria.  EXAM: CT ABDOMEN AND PELVIS WITHOUT CONTRAST  TECHNIQUE: Multidetector CT imaging of the abdomen and pelvis was performed following the standard protocol without IV contrast.  COMPARISON:  CT the abdomen and pelvis 03/03/2017.  FINDINGS: Lower chest: Unremarkable.  Hepatobiliary: In segment 2 of the liver there is a 2.5 x 1.0 cm well-defined low-attenuation lesion which is incompletely characterized on today's noncontrast CT examination, but is stable compared to the prior study, likely a cyst. No other definite hepatic lesions are identified on today's noncontrast CT examination. Unenhanced appearance of the gallbladder is normal.  Pancreas: No definite pancreatic mass or peripancreatic inflammatory changes are noted on today's noncontrast CT examination.  Spleen: Unremarkable.  Adrenals/Urinary Tract: There are no calcifications within the collecting system of either kidney, along the course of either ureter, or within the lumen of the urinary bladder. 7 mm low-attenuation lesion in the upper pole of the left kidney is incompletely characterized on today's noncontrast CT examination, but is similar to the prior study, likely a cyst. Right kidney and bilateral adrenal glands are normal in appearance. No hydroureteronephrosis. Urinary bladder is normal in appearance.  Stomach/Bowel: Unenhanced appearance of the stomach is normal. There is no pathologic  dilatation of small bowel or colon. Numerous colonic diverticulae are noted, without surrounding inflammatory changes to suggest an acute diverticulitis at this time. Normal appendix.  Vascular/Lymphatic: Aortic atherosclerosis, without definite aneurysm in the abdominal or pelvic vasculature. No lymphadenopathy noted in the abdomen.  Reproductive: Status post hysterectomy. Ovaries are not confidently identified may be surgically absent or atrophic.  Other: No significant volume  of ascites.  No pneumoperitoneum.  Musculoskeletal: Bilateral pars defects at L5. 4 mm of anterolisthesis of L5 upon S1. 8 mm of anterolisthesis of L4 upon L5. There are no aggressive appearing lytic or blastic lesions noted in the visualized portions of the skeleton.  IMPRESSION: 1. No acute findings are noted in the abdomen or pelvis to account for the patient's symptoms. Specifically, no urinary tract calculi no findings of urinary tract obstruction are noted at this time. 2. Extensive colonic diverticulosis without surrounding inflammatory changes to suggest an acute diverticulitis at this time. 3. Normal appendix. 4. Aortic atherosclerosis. 5. Additional incidental findings, as above.   Electronically Signed   By: Vinnie Langton M.D.   On: 05/07/2017 12:48   Assessment & Plan:    1. Suprapubic pain  - CT Renal stone study did not find an etiology for her pain  - Vesicare did not provide relief  - schedule cystoscopy to rule out CIS  2. Left flank pain  - No urological etiology found for her left flank pain  - follow up with PCP  3. Vaginal atrophy  - continue with vaginal estrogen cream  - has not helped with the suprapubic pain  - RTC in three months for exam  4. Left renal cyst  - not the source of her flank pain   Return for cystoscopy to rule out CIS.  These notes generated with voice recognition software. I apologize for typographical errors.  Zara Council,  [facility] [address], [place], [place] [address] [phone]

## 2017-05-19 ENCOUNTER — Ambulatory Visit (INDEPENDENT_AMBULATORY_CARE_PROVIDER_SITE_OTHER): Payer: Medicare Other | Admitting: Urology

## 2017-05-19 ENCOUNTER — Encounter: Payer: Self-pay | Admitting: Urology

## 2017-05-19 VITALS — BP 181/98 | HR 63 | Ht 66.0 in | Wt 176.4 lb

## 2017-05-19 DIAGNOSIS — N952 Postmenopausal atrophic vaginitis: Secondary | ICD-10-CM

## 2017-05-19 DIAGNOSIS — N281 Cyst of kidney, acquired: Secondary | ICD-10-CM | POA: Diagnosis not present

## 2017-05-19 DIAGNOSIS — R109 Unspecified abdominal pain: Secondary | ICD-10-CM

## 2017-05-19 DIAGNOSIS — R102 Pelvic and perineal pain: Secondary | ICD-10-CM | POA: Diagnosis not present

## 2017-05-19 NOTE — Patient Instructions (Addendum)
Cystoscopy  Cystoscopy is a procedure that is used to help diagnose and sometimes treat conditions that affect that lower urinary tract. The lower urinary tract includes the bladder and the tube that drains urine from the bladder out of the body (urethra). Cystoscopy is performed with a thin, tube-shaped instrument with a light and camera at the end (cystoscope). The cystoscope may be hard (rigid) or flexible, depending on the goal of the procedure.The cystoscope is inserted through the urethra, into the bladder.  Cystoscopy may be recommended if you have:   Urinary tractinfections that keep coming back (recurring).   Blood in the urine (hematuria).   Loss of bladder control (urinary incontinence) or an overactive bladder.   Unusual cells found in a urine sample.   A blockage in the urethra.   Painful urination.   An abnormality in the bladder found during an intravenous pyelogram (IVP) or CT scan.    Cystoscopy may also be done to remove a sample of tissue to be examined under a microscope (biopsy).  Tell a health care provider about:   Any allergies you have.   All medicines you are taking, including vitamins, herbs, eye drops, creams, and over-the-counter medicines.   Any problems you or family members have had with anesthetic medicines.   Any blood disorders you have.   Any surgeries you have had.   Any medical conditions you have.   Whether you are pregnant or may be pregnant.  What are the risks?  Generally, this is a safe procedure. However, problems may occur, including:   Infection.   Bleeding.   Allergic reactions to medicines.   Damage to other structures or organs.    What happens before the procedure?   Ask your health care provider about:  ? Changing or stopping your regular medicines. This is especially important if you are taking diabetes medicines or blood thinners.  ? Taking medicines such as aspirin and ibuprofen. These medicines can thin your blood. Do not take these medicines  before your procedure if your health care provider instructs you not to.   Follow instructions from your health care provider about eating or drinking restrictions.   You may be given antibiotic medicine to help prevent infection.   You may have an exam or testing, such as X-rays of the bladder, urethra, or kidneys.   You may have urine tests to check for signs of infection.   Plan to have someone take you home after the procedure.  What happens during the procedure?   To reduce your risk of infection,your health care team will wash or sanitize their hands.   You will be given one or more of the following:  ? A medicine to help you relax (sedative).  ? A medicine to numb the area (local anesthetic).   The area around the opening of your urethra will be cleaned.   The cystoscope will be passed through your urethra into your bladder.   Germ-free (sterile)fluid will flow through the cystoscope to fill your bladder. The fluid will stretch your bladder so that your surgeon can clearly examine your bladder walls.   The cystoscope will be removed and your bladder will be emptied.  The procedure may vary among health care providers and hospitals.  What happens after the procedure?   You may have some soreness or pain in your abdomen and urethra. Medicines will be available to help you.   You may have some blood in your urine.   Do not   Patient Education  2017 [employer]  

## 2017-06-06 ENCOUNTER — Ambulatory Visit (INDEPENDENT_AMBULATORY_CARE_PROVIDER_SITE_OTHER): Payer: Medicare Other | Admitting: Family Medicine

## 2017-06-06 ENCOUNTER — Encounter: Payer: Self-pay | Admitting: Family Medicine

## 2017-06-06 VITALS — BP 142/86 | HR 68 | Temp 97.7°F | Resp 14 | Wt 179.0 lb

## 2017-06-06 DIAGNOSIS — I10 Essential (primary) hypertension: Secondary | ICD-10-CM | POA: Diagnosis not present

## 2017-06-06 DIAGNOSIS — Z5181 Encounter for therapeutic drug level monitoring: Secondary | ICD-10-CM | POA: Diagnosis not present

## 2017-06-06 DIAGNOSIS — L509 Urticaria, unspecified: Secondary | ICD-10-CM | POA: Diagnosis not present

## 2017-06-06 DIAGNOSIS — I700 Atherosclerosis of aorta: Secondary | ICD-10-CM | POA: Diagnosis not present

## 2017-06-06 DIAGNOSIS — E782 Mixed hyperlipidemia: Secondary | ICD-10-CM | POA: Diagnosis not present

## 2017-06-06 DIAGNOSIS — R7303 Prediabetes: Secondary | ICD-10-CM | POA: Diagnosis not present

## 2017-06-06 MED ORDER — LISINOPRIL 5 MG PO TABS: 5.0000 mg | ORAL_TABLET | Freq: Every day | ORAL | 0 refills | Status: DC

## 2017-06-06 NOTE — Assessment & Plan Note (Signed)
Check lipids today; continue statin; goal LDL is less than 70; try to limit eggs, cheese, saturated fats

## 2017-06-06 NOTE — Assessment & Plan Note (Signed)
Goal LDL is less than 70; avoid saturated fats as much as possible

## 2017-06-06 NOTE — Assessment & Plan Note (Signed)
Discussed BP being higher than ideal; offered DASH guidelines vs adding pill and she wanted the pill

## 2017-06-06 NOTE — Progress Notes (Signed)
BP (!) 142/86   Pulse 68   Temp 97.7 F (36.5 C) (Oral)   Resp 14   Wt 179 lb (81.2 kg)   SpO2 98%   BMI 28.89 kg/m    Subjective:    Patient ID: Holly Green, female    DOB: 06/30/1946, 71 y.o.   MRN: [id number]  HPI: Holly Green is a 71 y.o. female  Chief Complaint  Patient presents with  . Follow-up    HPI Patient is here for f/u She has HTN; I asked about salt and she says she is trying to get away from chips; she uses salt when cooking No decongestants BP was high at urologist's office; she thinks maybe from so much pain  Prediabetes; brother had diabetes; no dry mouth, then she says sometimes yes, when she takes tylenol pm No sugary drinks; wheat bread Lab Results  Component Value Date   HGBA1C 6.0 (H) 11/22/2016    She has high cholesterol; pretty good eater CT scan showed aortic atherosclerosis Taking lovastatin;  Lab Results  Component Value Date   CHOL 171 11/22/2016   CHOL 175 05/07/2016   CHOL 172 10/12/2015   Lab Results  Component Value Date   HDL 95 11/22/2016   HDL 86 05/07/2016   HDL 89 10/12/2015   Lab Results  Component Value Date   LDLCALC 69 11/22/2016   LDLCALC 79 05/07/2016   LDLCALC 75 10/12/2015   Lab Results  Component Value Date   TRIG 34 11/22/2016   TRIG 49 05/07/2016   TRIG 42 10/12/2015   Lab Results  Component Value Date   CHOLHDL 1.8 11/22/2016   CHOLHDL 2.0 05/07/2016   CHOLHDL 1.9 10/12/2015   No results found for: LDLDIRECT  She is having ongoing abdominal pain, left side; she is seeing urologist; had scans and they are going to do more tests soon (maybe cystoscopy); hurts worse after finishing urination  At the end of the visit as I was preparing to walk out the door, she said she gets red welts on her arms and back sometimes at work; uses hydrocortisone cream and alcohol on it; goes away  Depression screen [facility] 2/9 06/06/2017 03/07/2017 02/20/2017 11/22/2016 08/07/2016  Decreased Interest 0 0 0 0 0    Down, Depressed, Hopeless 0 0 0 0 0  PHQ - 2 Score 0 0 0 0 0    Relevant past medical, surgical, family and social history reviewed Past Medical History:  Diagnosis Date  . Abdominal aortic atherosclerosis ([place]) 03/05/2017   CT scan April 2018  . Arthritis   . Bleeding disorder ([place])   . GERD (gastroesophageal reflux disease)   . History of stomach ulcers   . Hyperlipidemia   . Hypertension   . Osteoporosis   . Prediabetes 10/15/2015  . Spondylolisthesis at L4-L5 level 03/07/2017   Past Surgical History:  Procedure Laterality Date  . ABDOMINAL HYSTERECTOMY    . BACK SURGERY  2010  . BREAST CYST ASPIRATION Right 1990's  . FOOT SURGERY     Family History  Problem Relation Age of Onset  . Hypertension Brother   . Heart disease Brother   . Heart failure Brother   . Hypertension Mother   . Stroke Mother   . Stroke Father   . Prostate cancer Father   . Aneurysm Sister   . Breast cancer Sister 40  . Liver disease Maternal Aunt   . Heart failure Paternal Aunt   . Heart disease Paternal Uncle   .  Heart attack Brother   . Heart disease Brother   . Breast cancer Maternal Aunt   . Heart disease Paternal Aunt   . Kidney cancer Neg Hx   . Bladder Cancer Neg Hx    Social History   Social History  . Marital status: Single    Spouse name: N/A  . Number of children: N/A  . Years of education: N/A   Occupational History  . Not on file.   Social History Main Topics  . Smoking status: Former Research scientist (life sciences)  . Smokeless tobacco: Never Used     Comment: teenage years about 1 year  . Alcohol use No  . Drug use: No  . Sexual activity: No   Other Topics Concern  . Not on file   Social History Narrative  . No narrative on file    Interim medical history since last visit reviewed. Allergies and medications reviewed  Review of Systems Per HPI unless specifically indicated above     Objective:    BP (!) 142/86   Pulse 68   Temp 97.7 F (36.5 C) (Oral)   Resp 14   Wt  179 lb (81.2 kg)   SpO2 98%   BMI 28.89 kg/m   Wt Readings from Last 3 Encounters:  06/06/17 179 lb (81.2 kg)  05/19/17 176 lb 6.4 oz (80 kg)  04/24/17 178 lb 14.4 oz (81.1 kg)    Physical Exam  Constitutional: She appears well-developed and well-nourished. No distress.  HENT:  Head: Normocephalic and atraumatic.  Eyes: EOM are normal. No scleral icterus.  Neck: No thyromegaly present.  Cardiovascular: Normal rate, regular rhythm and normal heart sounds.   No murmur heard. Pulmonary/Chest: Effort normal and breath sounds normal. She has no wheezes.  Abdominal: Soft. Bowel sounds are normal. She exhibits no distension and no mass. There is no tenderness. There is no guarding.  Musculoskeletal: Normal range of motion. She exhibits no edema.  Neurological: She is alert. She exhibits normal muscle tone.  Skin: Skin is warm and dry. She is not diaphoretic. No pallor.  Psychiatric: She has a normal mood and affect. Her behavior is normal. Judgment and thought content normal.      Assessment & Plan:   Problem List Items Addressed This Visit      Cardiovascular and Mediastinum   Essential hypertension - Primary (Chronic)    Discussed BP being higher than ideal; offered DASH guidelines vs adding pill and she wanted the pill      Relevant Medications   lisinopril (PRINIVIL,ZESTRIL) 5 MG tablet   Abdominal aortic atherosclerosis ([facility])    Goal LDL is less than 70; avoid saturated fats as much as possible      Relevant Medications   lisinopril (PRINIVIL,ZESTRIL) 5 MG tablet   Other Relevant Orders   Lipid panel     Other   Prediabetes    Check glucose and A1c; avoid simple carbs, sweets, sweetened drinks      Relevant Orders   Hemoglobin A1c   Medication monitoring encounter    Monitor renal and hepatic function      Relevant Orders   COMPLETE METABOLIC PANEL WITH GFR   Hyperlipidemia (Chronic)    Check lipids today; continue statin; goal LDL is less than 70; try to  limit eggs, cheese, saturated fats      Relevant Medications   lisinopril (PRINIVIL,ZESTRIL) 5 MG tablet   Other Relevant Orders   Lipid panel    Other Visit Diagnoses  Urticaria       suggested plain claritin; we can talk more at next visit if still going on; pay special attention to what she's around when this happens      Follow up plan: Return in about 2 weeks (around 06/20/2017) for follow-up visit with Dr. Sanda Klein.  An after-visit summary was printed and given to the patient at [facility].  Please see the patient instructions which may contain other information and recommendations beyond what is mentioned above in the assessment and plan.  Meds ordered this encounter  Medications  . lisinopril (PRINIVIL,ZESTRIL) 5 MG tablet    Sig: Take 1 tablet (5 mg total) by mouth daily. For BP and kidneys    Dispense:  30 tablet    Refill:  0    Orders Placed This Encounter  Procedures  . Hemoglobin A1c  . Lipid panel  . COMPLETE METABOLIC PANEL WITH GFR

## 2017-06-06 NOTE — Assessment & Plan Note (Signed)
Check glucose and A1c; avoid simple carbs, sweets, sweetened drinks

## 2017-06-06 NOTE — Patient Instructions (Addendum)
Try to follow the DASH guidelines (DASH stands for Dietary Approaches to Stop Hypertension) Try to limit the sodium in your diet.  Ideally, consume less than 1.5 grams (less than 1,500mg ) per day. Do not add salt when cooking or at the table.  Check the sodium amount on labels when shopping, and choose items lower in sodium when given a choice. Avoid or limit foods that already contain a lot of sodium. Eat a diet rich in fruits and vegetables and whole grains.  We'll get labs today Add new blood pressure medicine once a day Keep taking your current medicine  DASH Eating Plan DASH stands for "Dietary Approaches to Stop Hypertension." The DASH eating plan is a healthy eating plan that has been shown to reduce high blood pressure (hypertension). It may also reduce your risk for type 2 diabetes, heart disease, and stroke. The DASH eating plan may also help with weight loss. What are tips for following this plan? General guidelines  Avoid eating more than 2,300 mg (milligrams) of salt (sodium) a day. If you have hypertension, you may need to reduce your sodium intake to 1,500 mg a day.  Limit alcohol intake to no more than 1 drink a day for nonpregnant women and 2 drinks a day for men. One drink equals 12 oz of beer, 5 oz of wine, or 1 oz of hard liquor.  Work with your health care provider to maintain a healthy body weight or to lose weight. Ask what an ideal weight is for you.  Get at least 30 minutes of exercise that causes your heart to beat faster (aerobic exercise) most days of the week. Activities may include walking, swimming, or biking.  Work with your health care provider or diet and nutrition specialist (dietitian) to adjust your eating plan to your individual calorie needs. Reading food labels  Check food labels for the amount of sodium per serving. Choose foods with less than 5 percent of the Daily Value of sodium. Generally, foods with less than 300 mg of sodium per serving fit  into this eating plan.  To find whole grains, look for the word "whole" as the first word in the ingredient list. Shopping  Buy products labeled as "low-sodium" or "no salt added."  Buy fresh foods. Avoid canned foods and premade or frozen meals. Cooking  Avoid adding salt when cooking. Use salt-free seasonings or herbs instead of table salt or sea salt. Check with your health care provider or pharmacist before using salt substitutes.  Do not fry foods. Cook foods using healthy methods such as baking, boiling, grilling, and broiling instead.  Cook with heart-healthy oils, such as olive, canola, soybean, or sunflower oil. Meal planning   Eat a balanced diet that includes: ? 5 or more servings of fruits and vegetables each day. At each meal, try to fill half of your plate with fruits and vegetables. ? Up to 6-8 servings of whole grains each day. ? Less than 6 oz of lean meat, poultry, or fish each day. A 3-oz serving of meat is about the same size as a deck of cards. One egg equals 1 oz. ? 2 servings of low-fat dairy each day. ? A serving of nuts, seeds, or beans 5 times each week. ? Heart-healthy fats. Healthy fats called Omega-3 fatty acids are found in foods such as flaxseeds and coldwater fish, like sardines, salmon, and mackerel.  Limit how much you eat of the following: ? Canned or prepackaged foods. ? Food that is  high in trans fat, such as fried foods. ? Food that is high in saturated fat, such as fatty meat. ? Sweets, desserts, sugary drinks, and other foods with added sugar. ? Full-fat dairy products.  Do not salt foods before eating.  Try to eat at least 2 vegetarian meals each week.  Eat more home-cooked food and less restaurant, buffet, and fast food.  When eating at a restaurant, ask that your food be prepared with less salt or no salt, if possible. What foods are recommended? The items listed may not be a complete list. Talk with your dietitian about what dietary  choices are best for you. Grains Whole-grain or whole-wheat bread. Whole-grain or whole-wheat pasta. Brown rice. [place]. Bulgur. Whole-grain and low-sodium cereals. Pita bread. Low-fat, low-sodium crackers. Whole-wheat flour tortillas. Vegetables Fresh or frozen vegetables (raw, steamed, roasted, or grilled). Low-sodium or reduced-sodium tomato and vegetable juice. Low-sodium or reduced-sodium tomato sauce and tomato paste. Low-sodium or reduced-sodium canned vegetables. Fruits All fresh, dried, or frozen fruit. Canned fruit in natural juice (without added sugar). Meat and other protein foods Skinless chicken or [place]. Ground chicken or [place]. Pork with fat trimmed off. Fish and seafood. Egg whites. Dried beans, peas, or lentils. Unsalted nuts, nut butters, and seeds. Unsalted canned beans. Lean cuts of beef with fat trimmed off. Low-sodium, lean deli meat. Dairy Low-fat (1%) or fat-free (skim) milk. Fat-free, low-fat, or reduced-fat cheeses. Nonfat, low-sodium ricotta or cottage cheese. Low-fat or nonfat yogurt. Low-fat, low-sodium cheese. Fats and oils Soft margarine without trans fats. Vegetable oil. Low-fat, reduced-fat, or light mayonnaise and salad dressings (reduced-sodium). Canola, safflower, olive, soybean, and sunflower oils. Avocado. Seasoning and other foods Herbs. Spices. Seasoning mixes without salt. Unsalted popcorn and pretzels. Fat-free sweets. What foods are not recommended? The items listed may not be a complete list. Talk with your dietitian about what dietary choices are best for you. Grains Baked goods made with fat, such as croissants, muffins, or some breads. Dry pasta or rice meal packs. Vegetables Creamed or fried vegetables. Vegetables in a cheese sauce. Regular canned vegetables (not low-sodium or reduced-sodium). Regular canned tomato sauce and paste (not low-sodium or reduced-sodium). Regular tomato and vegetable juice (not low-sodium or reduced-sodium).  Angie Fava. Olives. Fruits Canned fruit in a light or heavy syrup. Fried fruit. Fruit in cream or butter sauce. Meat and other protein foods Fatty cuts of meat. Ribs. Fried meat. Berniece Salines. Sausage. Bologna and other processed lunch meats. Salami. Fatback. Hotdogs. Bratwurst. Salted nuts and seeds. Canned beans with added salt. Canned or smoked fish. Whole eggs or egg yolks. Chicken or [place] with skin. Dairy Whole or 2% milk, cream, and half-and-half. Whole or full-fat cream cheese. Whole-fat or sweetened yogurt. Full-fat cheese. Nondairy creamers. Whipped toppings. Processed cheese and cheese spreads. Fats and oils Butter. Stick margarine. Lard. Shortening. Ghee. Bacon fat. Tropical oils, such as coconut, palm kernel, or palm oil. Seasoning and other foods Salted popcorn and pretzels. Onion salt, garlic salt, seasoned salt, table salt, and sea salt. Worcestershire sauce. Tartar sauce. Barbecue sauce. Teriyaki sauce. Soy sauce, including reduced-sodium. Steak sauce. Canned and packaged gravies. Fish sauce. Oyster sauce. Cocktail sauce. Horseradish that you find on the shelf. Ketchup. Mustard. Meat flavorings and tenderizers. Bouillon cubes. Hot sauce and Tabasco sauce. Premade or packaged marinades. Premade or packaged taco seasonings. Relishes. Regular salad dressings. Where to find more information:  National Heart, Lung, and [place]: https://wilson-eaton.com/  American Heart Association: www.heart.org Summary  The DASH eating plan is a healthy eating  plan that has been shown to reduce high blood pressure (hypertension). It may also reduce your risk for type 2 diabetes, heart disease, and stroke.  With the DASH eating plan, you should limit salt (sodium) intake to 2,300 mg a day. If you have hypertension, you may need to reduce your sodium intake to 1,500 mg a day.  When on the DASH eating plan, aim to eat more fresh fruits and vegetables, whole grains, lean proteins, low-fat dairy, and  heart-healthy fats.  Work with your health care provider or diet and nutrition specialist (dietitian) to adjust your eating plan to your individual calorie needs. This information is not intended to replace advice given to you by your health care provider. Make sure you discuss any questions you have with your health care provider. Document Released: 10/31/2011 Document Revised: 11/04/2016 Document Reviewed: 11/04/2016 Elsevier Interactive Patient Education  2017 [employer].

## 2017-06-06 NOTE — Assessment & Plan Note (Signed)
Monitor renal and hepatic function

## 2017-06-07 LAB — LIPID PANEL
Cholesterol: 175 mg/dL (ref <200)
HDL: 92 mg/dL (ref >50)
LDL Cholesterol: 75 mg/dL (ref <100)
Total CHOL/HDL Ratio: 1.9 Ratio (ref <5.0)
Triglycerides: 38 mg/dL (ref <150)
VLDL: 8 mg/dL (ref <30)

## 2017-06-07 LAB — HEMOGLOBIN A1C
Hgb A1c MFr Bld: 5.7 % — ABNORMAL HIGH (ref <5.7)
Mean Plasma Glucose: 117 mg/dL

## 2017-06-07 LAB — COMPLETE METABOLIC PANEL WITH GFR
ALT: 14 U/L (ref 6–29)
AST: 21 U/L (ref 10–35)
Albumin: 3.9 g/dL (ref 3.6–5.1)
Alkaline Phosphatase: 89 U/L (ref 33–130)
BUN: 17 mg/dL (ref 7–25)
CO2: 24 mmol/L (ref 20–31)
Calcium: 9.3 mg/dL (ref 8.6–10.4)
Chloride: 102 mmol/L (ref 98–110)
Creat: 0.69 mg/dL (ref 0.50–0.99)
GFR, Est African American: >89 mL/min (ref >=60)
GFR, Est Non African American: 89 mL/min (ref >=60)
Glucose, Bld: 90 mg/dL (ref 65–99)
Potassium: 3.7 mmol/L (ref 3.5–5.3)
Sodium: 138 mmol/L (ref 135–146)
Total Bilirubin: 1.2 mg/dL (ref 0.2–1.2)
Total Protein: 6.7 g/dL (ref 6.1–8.1)

## 2017-06-16 ENCOUNTER — Ambulatory Visit (INDEPENDENT_AMBULATORY_CARE_PROVIDER_SITE_OTHER): Payer: Medicare Other | Admitting: Urology

## 2017-06-16 ENCOUNTER — Encounter: Payer: Self-pay | Admitting: Urology

## 2017-06-16 VITALS — BP 153/89 | HR 63 | Ht 66.0 in | Wt 176.4 lb

## 2017-06-16 DIAGNOSIS — R102 Pelvic and perineal pain: Secondary | ICD-10-CM | POA: Diagnosis not present

## 2017-06-16 DIAGNOSIS — N3946 Mixed incontinence: Secondary | ICD-10-CM

## 2017-06-16 LAB — URINALYSIS, COMPLETE
Bilirubin, UA: NEGATIVE
Glucose, UA: NEGATIVE
Ketones, UA: NEGATIVE
Leukocytes, UA: NEGATIVE
Nitrite, UA: NEGATIVE
Protein, UA: NEGATIVE
Specific Gravity, UA: 1.015 (ref 1.005–1.030)
Urobilinogen, Ur: 0.2 mg/dL (ref 0.2–1.0)
pH, UA: 7 (ref 5.0–7.5)

## 2017-06-16 LAB — MICROSCOPIC EXAMINATION
RBC, UA: NONE SEEN /hpf (ref 0–?)
WBC, UA: NONE SEEN /hpf (ref 0–?)

## 2017-06-16 MED ORDER — LIDOCAINE HCL 2 % EX GEL
1.0000 "application " | Freq: Once | CUTANEOUS | Status: AC
  Administered 2017-06-16: 1 via URETHRAL

## 2017-06-16 MED ORDER — CIPROFLOXACIN HCL 500 MG PO TABS
500.0000 mg | ORAL_TABLET | Freq: Once | ORAL | Status: AC
  Administered 2017-06-16: 500 mg via ORAL

## 2017-06-16 MED ORDER — MIRABEGRON ER 50 MG PO TB24: 50.0000 mg | ORAL_TABLET | Freq: Every day | ORAL | 11 refills | Status: DC

## 2017-06-16 NOTE — Progress Notes (Signed)
06/16/2017 10:27 AM   [name] [birth year] [id number]  Referring provider: Arnetha Courser, MD [address] [place], [place] [address]  Chief Complaint  Patient presents with  . Cysto    HPI: Holly Green: Patient is a 71 year old African American female who is referred by Dr. Arnetha Courser for chronic suprapubic pain.  She states she has pain x 3 months.  It is located in the suprapubic area and radiates to her sides with the left > right.  She describes the pain as cramping in sensation.  At first, the pain occurred after urination.  The pain is now occurring with the urge to urinate, but it is still worse after urinating.  She was given an antibiotics on 02/20/2017 with no relief of symptoms.  She has nocturia x 3, frequency x 4-8, urge incontinence x 0-3.  Contrast CT performed on 03/03/2017 normal  She is experiencing frequency of urination, pain in her stomach when urinating and nocturia.  She is still experiencing left sided suprapubic pain that radiates into her left lower quadrant.  She states the pain is present when she has the urge to urinate and intensifies once she empties her bladder. Vesicare failed  Today Frequency is stable The patient still has a left lower quadrant pain worse after urination. Clinically noninfected today  Cystoscopy: The patient after written consent underwent flexible cystoscopy utilizing sterile technique. Bladder mucosa and trigone were normal. There was no carcinoma. There was no cystitis. There is no pain with bladder filling. She tolerated the procedure very well. Urethral examination was normal   PMH: Past Medical History:  Diagnosis Date  . Abdominal aortic atherosclerosis ([facility]) 03/05/2017   CT scan April 2018  . Arthritis   . Bleeding disorder ([facility])   . GERD (gastroesophageal reflux disease)   . History of stomach ulcers   . Hyperlipidemia   . Hypertension   . Osteoporosis   . Prediabetes 10/15/2015  .  Spondylolisthesis at L4-L5 level 03/07/2017    Surgical History: Past Surgical History:  Procedure Laterality Date  . ABDOMINAL HYSTERECTOMY    . BACK SURGERY  2010  . BREAST CYST ASPIRATION Right 1990's  . FOOT SURGERY      Home Medications:  Allergies as of 06/16/2017   No Known Allergies     Medication List       Accurate as of 06/16/17 10:27 AM. Always use your most recent med list.          bisoprolol-hydrochlorothiazide 10-6.25 MG tablet Commonly known as:  ZIAC Take 1 tablet by mouth daily.   Calcium-Vitamin D 600-400 MG-UNIT Tabs Take 1 tablet by mouth daily.   lisinopril 5 MG tablet Commonly known as:  PRINIVIL,ZESTRIL Take 1 tablet (5 mg total) by mouth daily. For BP and kidneys   lovastatin 20 MG tablet Commonly known as:  MEVACOR Take 1 tablet (20 mg total) by mouth at bedtime.   ranitidine 150 MG tablet Commonly known as:  ZANTAC Take 1 tablet (150 mg total) by mouth 2 (two) times daily. If needed for heartburn, acid reflux       Allergies: No Known Allergies  Family History: Family History  Problem Relation Age of Onset  . Hypertension Brother   . Heart disease Brother   . Heart failure Brother   . Hypertension Mother   . Stroke Mother   . Stroke Father   . Prostate cancer Father   . Aneurysm Sister   . Breast  cancer Sister 84  . Liver disease Maternal Aunt   . Heart failure Paternal Aunt   . Heart disease Paternal Uncle   . Heart attack Brother   . Heart disease Brother   . Breast cancer Maternal Aunt   . Heart disease Paternal Aunt   . Kidney cancer Neg Hx   . Bladder Cancer Neg Hx     Social History:  reports that she has quit smoking. She has never used smokeless tobacco. She reports that she does not drink alcohol or use drugs.  ROS:                                        Physical Exam: BP (!) 153/89 (BP Location: Left Arm, Patient Position: Sitting, Cuff Size: Normal)   Pulse 63   Ht 5\' 6"  (1.676  m)   Wt 176 lb 6.4 oz (80 kg)   BMI 28.47 kg/m   Constitutional:  Alert and oriented, No acute distress.   Laboratory Data: Lab Results  Component Value Date   WBC 4.8 02/20/2017   HGB 11.5 (L) 02/20/2017   HCT 35.5 02/20/2017   MCV 87.4 02/20/2017   PLT 221 02/20/2017    Lab Results  Component Value Date   CREATININE 0.69 06/06/2017    No results found for: PSA  No results found for: TESTOSTERONE  Lab Results  Component Value Date   HGBA1C 5.7 (H) 06/06/2017    Urinalysis    Component Value Date/Time   COLORURINE STRAW (A) 08/08/2015 1213   APPEARANCEUR Clear 04/24/2017 1024   LABSPEC 1.005 08/08/2015 1213   LABSPEC 1.020 01/11/2014 1139   PHURINE 8.0 08/08/2015 1213   GLUCOSEU Negative 04/24/2017 1024   GLUCOSEU 150 mg/dL 01/11/2014 1139   HGBUR NEGATIVE 08/08/2015 1213   BILIRUBINUR Negative 04/24/2017 1024   BILIRUBINUR Negative 01/11/2014 1139   KETONESUR NEGATIVE 08/08/2015 1213   PROTEINUR Negative 04/24/2017 1024   PROTEINUR NEGATIVE 08/08/2015 1213   UROBILINOGEN negative 02/20/2017 1201   UROBILINOGEN 0.2 09/27/2009 0843   NITRITE Negative 04/24/2017 1024   NITRITE NEGATIVE 08/08/2015 1213   LEUKOCYTESUR Negative 04/24/2017 1024   LEUKOCYTESUR Negative 01/11/2014 1139    Pertinent Imaging: As above  Assessment & Plan:  The patient voids every 30-60 minutes in the morning but every 2 hours in the afternoon. She primarily goes because her bladder feels full but then she says that the pain starts. My index of suspicion is low that she has interstitial cystitis. I did not discuss a hydrodistention. Recognizing limitations and therapy the role of the beta 3 agonist was discussed. I may discuss a hydrodistention next time. Samples and prescription given   1. Suprapubic pain 2. Urinary frequency - Urinalysis, Complete - ciprofloxacin (CIPRO) tablet 500 mg; Take 1 tablet (500 mg total) by mouth once. - lidocaine (XYLOCAINE) 2 % jelly 1 application;  Place 1 application into the urethra once.   No Follow-up on file.  Reece Packer, MD  [facility] [address] [place], [place] [address] [phone]

## 2017-06-20 ENCOUNTER — Ambulatory Visit (INDEPENDENT_AMBULATORY_CARE_PROVIDER_SITE_OTHER): Payer: Medicare Other | Admitting: Family Medicine

## 2017-06-20 ENCOUNTER — Encounter: Payer: Self-pay | Admitting: Family Medicine

## 2017-06-20 VITALS — BP 138/72 | HR 67 | Temp 98.3°F | Resp 14 | Wt 174.5 lb

## 2017-06-20 DIAGNOSIS — M4316 Spondylolisthesis, lumbar region: Secondary | ICD-10-CM | POA: Diagnosis not present

## 2017-06-20 DIAGNOSIS — I700 Atherosclerosis of aorta: Secondary | ICD-10-CM

## 2017-06-20 DIAGNOSIS — G8929 Other chronic pain: Secondary | ICD-10-CM | POA: Diagnosis not present

## 2017-06-20 DIAGNOSIS — M25562 Pain in left knee: Secondary | ICD-10-CM | POA: Diagnosis not present

## 2017-06-20 DIAGNOSIS — L509 Urticaria, unspecified: Secondary | ICD-10-CM

## 2017-06-20 DIAGNOSIS — I10 Essential (primary) hypertension: Secondary | ICD-10-CM | POA: Diagnosis not present

## 2017-06-20 DIAGNOSIS — S76011A Strain of muscle, fascia and tendon of right hip, initial encounter: Secondary | ICD-10-CM | POA: Diagnosis not present

## 2017-06-20 NOTE — Assessment & Plan Note (Signed)
Try to follow DASH guidelines, try to limit salt

## 2017-06-20 NOTE — Progress Notes (Signed)
BP 138/72   Pulse 67   Temp 98.3 F (36.8 C) (Oral)   Resp 14   Wt 174 lb 8 oz (79.2 kg)   SpO2 97%   BMI 28.17 kg/m    Subjective:    Patient ID: Holly Green, female    DOB: 01-Apr-1946, 71 y.o.   MRN: [id number]  HPI: Holly Green is a 71 y.o. female  Chief Complaint  Patient presents with  . Follow-up    HPI Patient  Last BP was 142/86; BP better today  She has abdominal pain, suprapubic pain for 3 months or more; she went to have the cystoscopy on Monday; no cancer, bladder is clear; more urinary frequency in the morning; no coffee; slower in the afternoons, not as much frequency; pain is worse in the morning; had colonoscopy 2015; had bleeding at one time, in ICU for five days, got blood transfusion  ---------------------------------------- CT scan May 07, 2017 IMPRESSION: 1. No acute findings are noted in the abdomen or pelvis to account for the patient's symptoms. Specifically, no urinary tract calculi no findings of urinary tract obstruction are noted at this time. 2. Extensive colonic diverticulosis without surrounding inflammatory changes to suggest an acute diverticulitis at this time. 3. Normal appendix. 4. Aortic atherosclerosis. 5. Additional incidental findings, as above.   Electronically Signed   By: Vinnie Langton M.D.   On: 05/07/2017 12:48 -------------------------------------------  She is having welts in her arms, using hydrocortisone cream and it goes away; they have stopped; she might have one on an arm but it went away yesterday; she has not itched last week or the week before; I asked about stress from worry, but it was more stress from pain; she is able to work and keep going; she thinks that maybe stopping oranges helped; they have gotten out of her system; some tomatoes but not much; none this week, maybe once this week; she doesn't know what made it stop; she used to come home from work and see welts; they would even pop up at  work; works at [employer]  We talked about her prediabetes and cholesterol at the last visit; she has been cutting back on her chips, not buying any more chips and sodas; weight loss is intentional  She is also having some left knee pain, just below the knee; not all the time, just hits her at times; also has right groin pain at times  Depression screen [facility] 2/9 06/20/2017 06/06/2017 03/07/2017 02/20/2017 11/22/2016  Decreased Interest 0 0 0 0 0  Down, Depressed, Hopeless 0 0 0 0 0  PHQ - 2 Score 0 0 0 0 0    Relevant past medical, surgical, family and social history reviewed Past Medical History:  Diagnosis Date  . Abdominal aortic atherosclerosis ([place]) 03/05/2017   CT scan April 2018  . Arthritis   . Bleeding disorder ([place])   . GERD (gastroesophageal reflux disease)   . History of stomach ulcers   . Hyperlipidemia   . Hypertension   . Osteoporosis   . Prediabetes 10/15/2015  . Spondylolisthesis at L4-L5 level 03/07/2017   Past Surgical History:  Procedure Laterality Date  . ABDOMINAL HYSTERECTOMY    . BACK SURGERY  2010  . BREAST CYST ASPIRATION Right 1990's  . FOOT SURGERY     Family History  Problem Relation Age of Onset  . Hypertension Brother   . Heart disease Brother   . Heart failure Brother   . Hypertension Mother   .  Stroke Mother   . Stroke Father   . Prostate cancer Father   . Aneurysm Sister   . Breast cancer Sister 53  . Liver disease Maternal Aunt   . Heart failure Paternal Aunt   . Heart disease Paternal Uncle   . Heart attack Brother   . Heart disease Brother   . Breast cancer Maternal Aunt   . Heart disease Paternal Aunt   . Kidney cancer Neg Hx   . Bladder Cancer Neg Hx    Social History   Social History  . Marital status: Single    Spouse name: N/A  . Number of children: N/A  . Years of education: N/A   Occupational History  . Not on file.   Social History Main Topics  . Smoking status: Former Research scientist (life sciences)  . Smokeless tobacco: Never Used      Comment: teenage years about 1 year  . Alcohol use No  . Drug use: No  . Sexual activity: No   Other Topics Concern  . Not on file   Social History Narrative  . No narrative on file    Interim medical history since last visit reviewed. Allergies and medications reviewed  Review of Systems  Constitutional: Negative for unexpected weight change.  Gastrointestinal: Negative for blood in stool.  Genitourinary: Negative for hematuria.   Per HPI unless specifically indicated above     Objective:    BP 138/72   Pulse 67   Temp 98.3 F (36.8 C) (Oral)   Resp 14   Wt 174 lb 8 oz (79.2 kg)   SpO2 97%   BMI 28.17 kg/m   Wt Readings from Last 3 Encounters:  06/20/17 174 lb 8 oz (79.2 kg)  06/16/17 176 lb 6.4 oz (80 kg)  06/06/17 179 lb (81.2 kg)    Physical Exam  Constitutional: She appears well-developed and well-nourished.  Weight loss 5 pounds since last visit  HENT:  Mouth/Throat: Mucous membranes are normal.  Eyes: EOM are normal. No scleral icterus.  Cardiovascular: Normal rate and regular rhythm.   Pulmonary/Chest: Effort normal and breath sounds normal.  Musculoskeletal:       Right hip: She exhibits decreased range of motion and tenderness. She exhibits no swelling and no deformity.       Left knee: She exhibits deformity. She exhibits normal range of motion and no swelling.       Legs: Hip flexors and adductors mildly tender with palpation and ROM testing; no masses  Skin: She is not diaphoretic. No pallor.  Psychiatric: She has a normal mood and affect. Her behavior is normal. Her mood appears not anxious. She does not exhibit a depressed mood.    Results for orders placed or performed in visit on 06/16/17  Microscopic Examination  Result Value Ref Range   WBC, UA None seen 0 - 5 /hpf   RBC, UA None seen 0 - 2 /hpf   Epithelial Cells (non renal) 0-10 0 - 10 /hpf   Bacteria, UA Few (A) None seen/Few  Urinalysis, Complete  Result Value Ref Range    Specific Gravity, UA 1.015 1.005 - 1.030   pH, UA 7.0 5.0 - 7.5   Color, UA Yellow Yellow   Appearance Ur Cloudy (A) Clear   Leukocytes, UA Negative Negative   Protein, UA Negative Negative/Trace   Glucose, UA Negative Negative   Ketones, UA Negative Negative   RBC, UA Trace (A) Negative   Bilirubin, UA Negative Negative   Urobilinogen, Ur  0.2 0.2 - 1.0 mg/dL   Nitrite, UA Negative Negative   Microscopic Examination See below:       Assessment & Plan:   Problem List Items Addressed This Visit      Cardiovascular and Mediastinum   Essential hypertension (Chronic)    Try to follow DASH guidelines, try to limit salt      Abdominal aortic atherosclerosis ([facility])    Discussed today, showed model; goal LDL less than 70        Musculoskeletal and Integument   Spondylolisthesis at L4-L5 level    Reviewed findings on CT scan; offered back specialist, politely declined; she is welcome to call me for referral if she changes her mind       Other Visit Diagnoses    Urticaria    -  Primary   sounds like it has resolved; may try plain claritin if needed, or she can call me for hydroxyzine   Chronic pain of left knee       likely arthritis; suggested ice topically; she declined offer for topical Rx (considered diclofenac)   Strain of hip adductor muscle, right, initial encounter       suggested stretches, physical therapy, low setting topical heat; she declined referral or medicine       Follow up plan: Return in about 6 months (around 12/08/2017) for twenty minute follow-up with fasting labs.  An after-visit summary was printed and given to the patient at [facility].  Please see the patient instructions which may contain other information and recommendations beyond what is mentioned above in the assessment and plan.  No orders of the defined types were placed in this encounter.   No orders of the defined types were placed in this encounter.

## 2017-06-20 NOTE — Patient Instructions (Addendum)
See how the myrbetriq does and then call / see urologist if not better Let me know if the hives come back and I can prescribe medicine if that recurs You can also try claritin if the hives recur (plain claritin)  Try to follow the DASH guidelines (DASH stands for Dietary Approaches to Stop Hypertension) Try to limit the sodium in your diet.  Ideally, consume less than 1.5 grams (less than 1,500mg ) per day. Do not add salt when cooking or at the table.  Check the sodium amount on labels when shopping, and choose items lower in sodium when given a choice. Avoid or limit foods that already contain a lot of sodium. Eat a diet rich in fruits and vegetables and whole grains.   DASH Eating Plan DASH stands for "Dietary Approaches to Stop Hypertension." The DASH eating plan is a healthy eating plan that has been shown to reduce high blood pressure (hypertension). It may also reduce your risk for type 2 diabetes, heart disease, and stroke. The DASH eating plan may also help with weight loss. What are tips for following this plan? General guidelines  Avoid eating more than 2,300 mg (milligrams) of salt (sodium) a day. If you have hypertension, you may need to reduce your sodium intake to 1,500 mg a day.  Limit alcohol intake to no more than 1 drink a day for nonpregnant women and 2 drinks a day for men. One drink equals 12 oz of beer, 5 oz of wine, or 1 oz of hard liquor.  Work with your health care provider to maintain a healthy body weight or to lose weight. Ask what an ideal weight is for you.  Get at least 30 minutes of exercise that causes your heart to beat faster (aerobic exercise) most days of the week. Activities may include walking, swimming, or biking.  Work with your health care provider or diet and nutrition specialist (dietitian) to adjust your eating plan to your individual calorie needs. Reading food labels  Check food labels for the amount of sodium per serving. Choose foods with  less than 5 percent of the Daily Value of sodium. Generally, foods with less than 300 mg of sodium per serving fit into this eating plan.  To find whole grains, look for the word "whole" as the first word in the ingredient list. Shopping  Buy products labeled as "low-sodium" or "no salt added."  Buy fresh foods. Avoid canned foods and premade or frozen meals. Cooking  Avoid adding salt when cooking. Use salt-free seasonings or herbs instead of table salt or sea salt. Check with your health care provider or pharmacist before using salt substitutes.  Do not fry foods. Cook foods using healthy methods such as baking, boiling, grilling, and broiling instead.  Cook with heart-healthy oils, such as olive, canola, soybean, or sunflower oil. Meal planning   Eat a balanced diet that includes: ? 5 or more servings of fruits and vegetables each day. At each meal, try to fill half of your plate with fruits and vegetables. ? Up to 6-8 servings of whole grains each day. ? Less than 6 oz of lean meat, poultry, or fish each day. A 3-oz serving of meat is about the same size as a deck of cards. One egg equals 1 oz. ? 2 servings of low-fat dairy each day. ? A serving of nuts, seeds, or beans 5 times each week. ? Heart-healthy fats. Healthy fats called Omega-3 fatty acids are found in foods such as flaxseeds and  coldwater fish, like sardines, salmon, and mackerel.  Limit how much you eat of the following: ? Canned or prepackaged foods. ? Food that is high in trans fat, such as fried foods. ? Food that is high in saturated fat, such as fatty meat. ? Sweets, desserts, sugary drinks, and other foods with added sugar. ? Full-fat dairy products.  Do not salt foods before eating.  Try to eat at least 2 vegetarian meals each week.  Eat more home-cooked food and less restaurant, buffet, and fast food.  When eating at a restaurant, ask that your food be prepared with less salt or no salt, if  possible. What foods are recommended? The items listed may not be a complete list. Talk with your dietitian about what dietary choices are best for you. Grains Whole-grain or whole-wheat bread. Whole-grain or whole-wheat pasta. Brown rice. [place]. Bulgur. Whole-grain and low-sodium cereals. Pita bread. Low-fat, low-sodium crackers. Whole-wheat flour tortillas. Vegetables Fresh or frozen vegetables (raw, steamed, roasted, or grilled). Low-sodium or reduced-sodium tomato and vegetable juice. Low-sodium or reduced-sodium tomato sauce and tomato paste. Low-sodium or reduced-sodium canned vegetables. Fruits All fresh, dried, or frozen fruit. Canned fruit in natural juice (without added sugar). Meat and other protein foods Skinless chicken or [place]. Ground chicken or [place]. Pork with fat trimmed off. Fish and seafood. Egg whites. Dried beans, peas, or lentils. Unsalted nuts, nut butters, and seeds. Unsalted canned beans. Lean cuts of beef with fat trimmed off. Low-sodium, lean deli meat. Dairy Low-fat (1%) or fat-free (skim) milk. Fat-free, low-fat, or reduced-fat cheeses. Nonfat, low-sodium ricotta or cottage cheese. Low-fat or nonfat yogurt. Low-fat, low-sodium cheese. Fats and oils Soft margarine without trans fats. Vegetable oil. Low-fat, reduced-fat, or light mayonnaise and salad dressings (reduced-sodium). Canola, safflower, olive, soybean, and sunflower oils. Avocado. Seasoning and other foods Herbs. Spices. Seasoning mixes without salt. Unsalted popcorn and pretzels. Fat-free sweets. What foods are not recommended? The items listed may not be a complete list. Talk with your dietitian about what dietary choices are best for you. Grains Baked goods made with fat, such as croissants, muffins, or some breads. Dry pasta or rice meal packs. Vegetables Creamed or fried vegetables. Vegetables in a cheese sauce. Regular canned vegetables (not low-sodium or reduced-sodium). Regular canned  tomato sauce and paste (not low-sodium or reduced-sodium). Regular tomato and vegetable juice (not low-sodium or reduced-sodium). Angie Fava. Olives. Fruits Canned fruit in a light or heavy syrup. Fried fruit. Fruit in cream or butter sauce. Meat and other protein foods Fatty cuts of meat. Ribs. Fried meat. Berniece Salines. Sausage. Bologna and other processed lunch meats. Salami. Fatback. Hotdogs. Bratwurst. Salted nuts and seeds. Canned beans with added salt. Canned or smoked fish. Whole eggs or egg yolks. Chicken or [place] with skin. Dairy Whole or 2% milk, cream, and half-and-half. Whole or full-fat cream cheese. Whole-fat or sweetened yogurt. Full-fat cheese. Nondairy creamers. Whipped toppings. Processed cheese and cheese spreads. Fats and oils Butter. Stick margarine. Lard. Shortening. Ghee. Bacon fat. Tropical oils, such as coconut, palm kernel, or palm oil. Seasoning and other foods Salted popcorn and pretzels. Onion salt, garlic salt, seasoned salt, table salt, and sea salt. Worcestershire sauce. Tartar sauce. Barbecue sauce. Teriyaki sauce. Soy sauce, including reduced-sodium. Steak sauce. Canned and packaged gravies. Fish sauce. Oyster sauce. Cocktail sauce. Horseradish that you find on the shelf. Ketchup. Mustard. Meat flavorings and tenderizers. Bouillon cubes. Hot sauce and Tabasco sauce. Premade or packaged marinades. Premade or packaged taco seasonings. Relishes. Regular salad dressings. Where to find  more information:  National Heart, Lung, and Blood Institute: https://wilson-eaton.com/  American Heart Association: www.heart.org Summary  The DASH eating plan is a healthy eating plan that has been shown to reduce high blood pressure (hypertension). It may also reduce your risk for type 2 diabetes, heart disease, and stroke.  With the DASH eating plan, you should limit salt (sodium) intake to 2,300 mg a day. If you have hypertension, you may need to reduce your sodium intake to 1,500 mg a day.  When  on the DASH eating plan, aim to eat more fresh fruits and vegetables, whole grains, lean proteins, low-fat dairy, and heart-healthy fats.  Work with your health care provider or diet and nutrition specialist (dietitian) to adjust your eating plan to your individual calorie needs. This information is not intended to replace advice given to you by your health care provider. Make sure you discuss any questions you have with your health care provider. Document Released: 10/31/2011 Document Revised: 11/04/2016 Document Reviewed: 11/04/2016 Elsevier Interactive Patient Education  2017 [employer].

## 2017-06-20 NOTE — Assessment & Plan Note (Signed)
Reviewed findings on CT scan; offered back specialist, politely declined; she is welcome to call me for referral if she changes her mind

## 2017-06-20 NOTE — Assessment & Plan Note (Signed)
Discussed today, showed model; goal LDL less than 70

## 2017-07-07 ENCOUNTER — Other Ambulatory Visit: Payer: Self-pay | Admitting: Family Medicine

## 2017-07-07 NOTE — Telephone Encounter (Signed)
Pt needs refill on Lisinopril to be sent to [address].

## 2017-07-08 MED ORDER — LISINOPRIL 5 MG PO TABS: 5.0000 mg | ORAL_TABLET | Freq: Every day | ORAL | 10 refills | Status: DC

## 2017-07-08 NOTE — Telephone Encounter (Signed)
Cr and K+ from July 2018 reviewed; BP from last visit reviewed; Rx approved

## 2017-08-06 ENCOUNTER — Encounter: Payer: Self-pay | Admitting: Urology

## 2017-08-06 ENCOUNTER — Ambulatory Visit (INDEPENDENT_AMBULATORY_CARE_PROVIDER_SITE_OTHER): Payer: Medicare Other | Admitting: Urology

## 2017-08-06 VITALS — BP 183/101 | HR 66 | Ht 66.0 in | Wt 175.0 lb

## 2017-08-06 DIAGNOSIS — N3020 Other chronic cystitis without hematuria: Secondary | ICD-10-CM | POA: Diagnosis not present

## 2017-08-06 NOTE — Progress Notes (Signed)
08/06/2017 10:16 AM   [name] 05/10/1946 [id number]  Referring provider: Arnetha Courser, MD [address] [place], [place] [address]  Chief Complaint  Patient presents with  . Follow-up    Suprapubic pain  . Urinary Incontinence    HPI: Holly Green: Patient is a 71 year old African American female who is referred by Dr. Arnetha Courser for chronic suprapubic pain. She states she has pain x 3 months. It is located in the suprapubic area and radiates to her sides with the left >right. She describes the pain as cramping in sensation. At first, the pain occurred after urination. The pain is now occurring with the urge to urinate, but it is still worse after urinating. She was given an antibiotics on 02/20/2017 with no relief of symptoms. She has nocturia x 3, frequency x 4-8, urge incontinence x 0-3. Contrast CT performed on 03/03/2017 normal  She is experiencing frequency of urination, pain in her stomach when urinating and nocturia. She is still experiencing left sided suprapubic pain that radiates into her left lower quadrant. She states the pain is present when she has the urge to urinate and intensifies once she empties her bladder. Vesicare failed  Today Frequency is stable The patient still has a left lower quadrant pain worse after urination. Clinically noninfected today  Cystoscopy: The patient after written consent underwent flexible cystoscopy utilizing sterile technique. Bladder mucosa and trigone were normal. There was no carcinoma. There was no cystitis. There is no pain with bladder filling. She tolerated the procedure very well. Urethral examination was normal    The patient voids every 30-60 minutes in the morning but every 2 hours in the afternoon. She primarily goes because her bladder feels full but then she says that the pain starts. My index of suspicion is low that she has interstitial cystitis. I did not discuss a hydrodistention.  Recognizing limitations and therapy the role of the beta 3 agonist was discussed. I may discuss a hydrodistention next time.   Today Frequency stable The patient has failed Vesicare in the beta 3 agonist. She describes the pain worse in the morning and more on the left side. She can have it without or with urination. She has more frequency in the morning.      PMH: Past Medical History:  Diagnosis Date  . Abdominal aortic atherosclerosis ([place]) 03/05/2017   CT scan April 2018  . Arthritis   . Bleeding disorder ([place])   . GERD (gastroesophageal reflux disease)   . History of stomach ulcers   . Hyperlipidemia   . Hypertension   . Osteoporosis   . Prediabetes 10/15/2015  . Spondylolisthesis at L4-L5 level 03/07/2017    Surgical History: Past Surgical History:  Procedure Laterality Date  . ABDOMINAL HYSTERECTOMY    . BACK SURGERY  2010  . BREAST CYST ASPIRATION Right 1990's  . FOOT SURGERY      Home Medications:  Allergies as of 08/06/2017   No Known Allergies     Medication List       Accurate as of 08/06/17 10:16 AM. Always use your most recent med list.          bisoprolol-hydrochlorothiazide 10-6.25 MG tablet Commonly known as:  ZIAC Take 1 tablet by mouth daily.   Calcium-Vitamin D 600-400 MG-UNIT Tabs Take 1 tablet by mouth daily.   lisinopril 5 MG tablet Commonly known as:  PRINIVIL,ZESTRIL Take 1 tablet (5 mg total) by mouth daily. For BP and kidneys  lovastatin 20 MG tablet Commonly known as:  MEVACOR Take 1 tablet (20 mg total) by mouth at bedtime.   mirabegron ER 50 MG Tb24 tablet Commonly known as:  MYRBETRIQ Take 1 tablet (50 mg total) by mouth daily.   ranitidine 150 MG tablet Commonly known as:  ZANTAC Take 1 tablet (150 mg total) by mouth 2 (two) times daily. If needed for heartburn, acid reflux       Allergies: No Known Allergies  Family History: Family History  Problem Relation Age of Onset  . Hypertension Brother   . Heart  disease Brother   . Heart failure Brother   . Hypertension Mother   . Stroke Mother   . Stroke Father   . Prostate cancer Father   . Aneurysm Sister   . Breast cancer Sister 50  . Liver disease Maternal Aunt   . Heart failure Paternal Aunt   . Heart disease Paternal Uncle   . Heart attack Brother   . Heart disease Brother   . Breast cancer Maternal Aunt   . Heart disease Paternal Aunt   . Kidney cancer Neg Hx   . Bladder Cancer Neg Hx     Social History:  reports that she has quit smoking. She has never used smokeless tobacco. She reports that she does not drink alcohol or use drugs.  ROS: UROLOGY Frequent Urination?: Yes Hard to postpone urination?: No Burning/pain with urination?: Yes Get up at night to urinate?: Yes Leakage of urine?: No Urine stream starts and stops?: No Trouble starting stream?: No Do you have to strain to urinate?: No Blood in urine?: No Urinary tract infection?: No Sexually transmitted disease?: No Injury to kidneys or bladder?: No Painful intercourse?: No Weak stream?: No Currently pregnant?: No Vaginal bleeding?: No Last menstrual period?: n  Gastrointestinal Nausea?: No Vomiting?: No Indigestion/heartburn?: No Diarrhea?: No Constipation?: No  Constitutional Fever: No Night sweats?: No Weight loss?: No Fatigue?: No  Skin Skin rash/lesions?: No Itching?: No  Eyes Blurred vision?: No Double vision?: No  Ears/Nose/Throat Sore throat?: No Sinus problems?: Yes  Hematologic/Lymphatic Swollen glands?: No Easy bruising?: No  Cardiovascular Leg swelling?: No Chest pain?: No  Respiratory Cough?: Yes Shortness of breath?: No  Endocrine Excessive thirst?: No  Musculoskeletal Back pain?: Yes Joint pain?: Yes  Neurological Headaches?: Yes Dizziness?: No  Psychologic Depression?: No Anxiety?: No  Physical Exam: BP (!) 183/101 (BP Location: Right Arm, Patient Position: Sitting, Cuff Size: Normal)   Pulse 66   Ht  5\' 6"  (1.676 m)   Wt 175 lb (79.4 kg)   BMI 28.25 kg/m   Constitutional:  Alert and oriented, No acute distress.  Laboratory Data: Lab Results  Component Value Date   WBC 4.8 02/20/2017   HGB 11.5 (L) 02/20/2017   HCT 35.5 02/20/2017   MCV 87.4 02/20/2017   PLT 221 02/20/2017    Lab Results  Component Value Date   CREATININE 0.69 06/06/2017    No results found for: PSA  No results found for: TESTOSTERONE  Lab Results  Component Value Date   HGBA1C 5.7 (H) 06/06/2017    Urinalysis    Component Value Date/Time   COLORURINE STRAW (A) 08/08/2015 1213   APPEARANCEUR Cloudy (A) 06/16/2017 0951   LABSPEC 1.005 08/08/2015 1213   LABSPEC 1.020 01/11/2014 1139   PHURINE 8.0 08/08/2015 1213   GLUCOSEU Negative 06/16/2017 0951   GLUCOSEU 150 mg/dL 01/11/2014 1139   HGBUR NEGATIVE 08/08/2015 1213   BILIRUBINUR Negative 06/16/2017 0951  BILIRUBINUR Negative 01/11/2014 [place] 08/08/2015 1213   PROTEINUR Negative 06/16/2017 [place] 08/08/2015 1213   UROBILINOGEN negative 02/20/2017 1201   UROBILINOGEN 0.2 09/27/2009 0843   NITRITE Negative 06/16/2017 0951   NITRITE NEGATIVE 08/08/2015 1213   LEUKOCYTESUR Negative 06/16/2017 0951   LEUKOCYTESUR Negative 01/11/2014 1139    Pertinent Imaging: none  Assessment & Plan:  The patient may have a morning diuresis aggravating his symptoms or not. My index of suspicion is mild to moderate that she could have interstitial cystitis. The pain can be quite significant in the morning but is less severe in the afternoon. At some time she said is worse with movement which would not be her bladder. We talked about a hydrodistention in detail with pros cons and risks with my usual template as noted. Rare sequelae discussed. I think it is very reasonable based upon severity and chronicity of symptoms to undergo hydrodistention. I believe the hydrodistention is negative she does not have interstitial  cystitis  There are no diagnoses linked to this encounter.  No Follow-up on file.  Reece Packer, MD  [facility] [address], [place], [place] [address] [phone]

## 2017-08-07 ENCOUNTER — Other Ambulatory Visit: Payer: Self-pay | Admitting: Radiology

## 2017-08-07 DIAGNOSIS — N3020 Other chronic cystitis without hematuria: Secondary | ICD-10-CM

## 2017-08-07 DIAGNOSIS — R102 Pelvic and perineal pain: Secondary | ICD-10-CM

## 2017-08-12 ENCOUNTER — Encounter
Admission: RE | Admit: 2017-08-12 | Discharge: 2017-08-12 | Disposition: A | Discharge status: Home / Self Care | Payer: Medicare Other | Source: Ambulatory Visit | Attending: Urology | Admitting: Urology

## 2017-08-12 DIAGNOSIS — R350 Frequency of micturition: Secondary | ICD-10-CM | POA: Diagnosis not present

## 2017-08-12 DIAGNOSIS — I10 Essential (primary) hypertension: Secondary | ICD-10-CM

## 2017-08-12 DIAGNOSIS — G894 Chronic pain syndrome: Secondary | ICD-10-CM | POA: Diagnosis not present

## 2017-08-12 DIAGNOSIS — M810 Age-related osteoporosis without current pathological fracture: Secondary | ICD-10-CM | POA: Diagnosis not present

## 2017-08-12 DIAGNOSIS — Z87891 Personal history of nicotine dependence: Secondary | ICD-10-CM | POA: Diagnosis not present

## 2017-08-12 DIAGNOSIS — Z01818 Encounter for other preprocedural examination: Secondary | ICD-10-CM | POA: Insufficient documentation

## 2017-08-12 DIAGNOSIS — R300 Dysuria: Secondary | ICD-10-CM | POA: Diagnosis not present

## 2017-08-12 DIAGNOSIS — R9431 Abnormal electrocardiogram [ECG] [EKG]: Secondary | ICD-10-CM

## 2017-08-12 DIAGNOSIS — E785 Hyperlipidemia, unspecified: Secondary | ICD-10-CM | POA: Diagnosis not present

## 2017-08-12 DIAGNOSIS — R102 Pelvic and perineal pain: Secondary | ICD-10-CM | POA: Diagnosis not present

## 2017-08-12 DIAGNOSIS — Z79899 Other long term (current) drug therapy: Secondary | ICD-10-CM | POA: Diagnosis not present

## 2017-08-12 DIAGNOSIS — K219 Gastro-esophageal reflux disease without esophagitis: Secondary | ICD-10-CM | POA: Diagnosis not present

## 2017-08-12 HISTORY — DX: Prediabetes: R73.03

## 2017-08-12 HISTORY — DX: Diverticulitis of intestine, part unspecified, without perforation or abscess without bleeding: K57.92

## 2017-08-12 HISTORY — DX: Personal history of urinary calculi: Z87.442

## 2017-08-12 LAB — CBC
HCT: 35.8 % (ref 35.0–47.0)
Hemoglobin: 12.3 g/dL (ref 12.0–16.0)
MCH: 30.1 pg (ref 26.0–34.0)
MCHC: 34.3 g/dL (ref 32.0–36.0)
MCV: 87.7 fL (ref 80.0–100.0)
Platelets: 212 10*3/uL (ref 150–440)
RBC: 4.08 MIL/uL (ref 3.80–5.20)
RDW: 14.5 % (ref 11.5–14.5)
WBC: 5.1 10*3/uL (ref 3.6–11.0)

## 2017-08-12 LAB — POTASSIUM: Potassium: 3.6 mmol/L (ref 3.5–5.1)

## 2017-08-12 NOTE — Pre-Procedure Instructions (Addendum)
Spoke with Dr. Ronelle Nigh regarding pt's EKG from today's PAT visit.  He wants to send the EKG to pt's PCP for review and treatment as indicated.

## 2017-08-12 NOTE — Patient Instructions (Signed)
  Your procedure is scheduled PI:[id number] Sept. 21, 2018. Report to Same Day Surgery. To find out your arrival time please call [phone] between 1PM - 3PM on Thursday Sept. 20, 2018.  Remember: Instructions that are not followed completely may result in serious medical risk, up to and including death, or upon the discretion of your surgeon and anesthesiologist your surgery may need to be rescheduled.    _x___ 1. Do not eat food after midnight. No gum chewing or hard candies.       MAY DRINK WATER, APPLE JUICE, BLACK COFFEE OR TEA OR GATOR ADE UNTIL    UP TIL 2 HOURS PRIOR TO ARRIVAL TIME.    ____ 2. No Alcohol for 24 hours before or after surgery.   ____ 3. Bring all medications with you on the day of surgery if instructed.    __x__ 4. Notify your doctor if there is any change in your medical condition     (cold, fever, infections).    _____ 5. No smoking 24 hours prior to surgery.     Do not wear jewelry, make-up, hairpins, clips or nail polish.  Do not wear lotions, powders, or perfumes.   Do not shave 48 hours prior to surgery. Men may shave face and neck.  Do not bring valuables to the hospital.    [facility] is not responsible for any belongings or valuables.               Contacts, dentures or bridgework may not be worn into surgery.  Leave your suitcase in the car. After surgery it may be brought to your room.  For patients admitted to the hospital, discharge time is determined by your treatment team.   Patients discharged the day of surgery will not be allowed to drive home.    Please read over the following fact sheets that you were given:   [facility] Preparing for Surgery  __X__ Take these medicines the morning of surgery with A SIP OF WATER:    1. ranitidine (ZANTAC) TAKE AT BEDTIME ON THURSDAY AND AGAIN Friday MORNING.   ____ Fleet Enema (as directed)   ____ Use CHG Soap as directed on instruction sheet  ____ Use inhalers on the day of surgery and bring  to hospital day of surgery  ____ Stop metformin 2 days prior to surgery    ____ Take 1/2 of usual insulin dose the night before surgery and none on the morning of surgery.   ____ Stop Coumadin/Plavix/aspirin on DOES NOT APPLY.   ____ Stop Anti-inflammatories such as Advil, Aleve, Ibuprofen, Motrin, Naproxen,  Naprosyn, Goodies powders or aspirin products. OK to take Tylenol.   ____ Stop supplements until after surgery.    ____ Bring C-Pap to the hospital.

## 2017-08-14 NOTE — Pre-Procedure Instructions (Signed)
CLEARED BY DR LADA LOW RISK 08/14/17

## 2017-08-15 ENCOUNTER — Encounter: Payer: Self-pay | Admitting: Emergency Medicine

## 2017-08-15 ENCOUNTER — Ambulatory Visit: Payer: Medicare Other | Admitting: Anesthesiology

## 2017-08-15 ENCOUNTER — Ambulatory Visit
Admission: RE | Admit: 2017-08-15 | Discharge: 2017-08-15 | Disposition: A | Discharge status: Home / Self Care | Payer: Medicare Other | Source: Ambulatory Visit | Attending: Urology | Admitting: Urology

## 2017-08-15 ENCOUNTER — Encounter
Admission: RE | Disposition: A | Discharge status: Home / Self Care | Payer: Self-pay | Source: Ambulatory Visit | Attending: Urology

## 2017-08-15 DIAGNOSIS — I10 Essential (primary) hypertension: Secondary | ICD-10-CM | POA: Diagnosis not present

## 2017-08-15 DIAGNOSIS — R102 Pelvic and perineal pain: Secondary | ICD-10-CM | POA: Diagnosis not present

## 2017-08-15 DIAGNOSIS — E785 Hyperlipidemia, unspecified: Secondary | ICD-10-CM | POA: Diagnosis not present

## 2017-08-15 DIAGNOSIS — K219 Gastro-esophageal reflux disease without esophagitis: Secondary | ICD-10-CM | POA: Diagnosis not present

## 2017-08-15 DIAGNOSIS — G894 Chronic pain syndrome: Secondary | ICD-10-CM | POA: Diagnosis not present

## 2017-08-15 DIAGNOSIS — M810 Age-related osteoporosis without current pathological fracture: Secondary | ICD-10-CM | POA: Insufficient documentation

## 2017-08-15 DIAGNOSIS — R350 Frequency of micturition: Secondary | ICD-10-CM | POA: Insufficient documentation

## 2017-08-15 DIAGNOSIS — N3020 Other chronic cystitis without hematuria: Secondary | ICD-10-CM

## 2017-08-15 DIAGNOSIS — R309 Painful micturition, unspecified: Secondary | ICD-10-CM | POA: Diagnosis not present

## 2017-08-15 DIAGNOSIS — R300 Dysuria: Secondary | ICD-10-CM | POA: Diagnosis not present

## 2017-08-15 DIAGNOSIS — Z79899 Other long term (current) drug therapy: Secondary | ICD-10-CM | POA: Insufficient documentation

## 2017-08-15 DIAGNOSIS — N2889 Other specified disorders of kidney and ureter: Secondary | ICD-10-CM | POA: Diagnosis not present

## 2017-08-15 DIAGNOSIS — Z87891 Personal history of nicotine dependence: Secondary | ICD-10-CM | POA: Insufficient documentation

## 2017-08-15 HISTORY — PX: CYSTO WITH HYDRODISTENSION: SHX5453

## 2017-08-15 LAB — GLUCOSE, CAPILLARY: Glucose-Capillary: 91 mg/dL (ref 65–99)

## 2017-08-15 SURGERY — CYSTOSCOPY, WITH BLADDER HYDRODISTENSION
Anesthesia: General | Site: Bladder | Wound class: Clean

## 2017-08-15 MED ORDER — CEPHALEXIN 500 MG PO CAPS: 500.0000 mg | ORAL_CAPSULE | Freq: Three times a day (TID) | ORAL | 0 refills | Status: DC

## 2017-08-15 MED ORDER — HYDROCODONE-ACETAMINOPHEN 5-325 MG PO TABS: 1.0000 | ORAL_TABLET | ORAL | 0 refills | Status: DC | PRN

## 2017-08-15 MED ORDER — FENTANYL CITRATE (PF) 100 MCG/2ML IJ SOLN
25.0000 ug | INTRAMUSCULAR | Status: DC | PRN
  Administered 2017-08-15 (×2): 25 ug via INTRAVENOUS

## 2017-08-15 MED ORDER — ONDANSETRON HCL 4 MG/2ML IJ SOLN: 4.0000 mg | Freq: Once | INTRAMUSCULAR | Status: DC | PRN

## 2017-08-15 MED ORDER — GLYCOPYRROLATE 0.2 MG/ML IJ SOLN
INTRAMUSCULAR | Status: DC | PRN
  Administered 2017-08-15: 0.2 mg via INTRAVENOUS

## 2017-08-15 MED ORDER — HYDROCODONE-ACETAMINOPHEN 5-325 MG PO TABS
ORAL_TABLET | ORAL | Status: AC
  Filled 2017-08-15: qty 1

## 2017-08-15 MED ORDER — FENTANYL CITRATE (PF) 100 MCG/2ML IJ SOLN
INTRAMUSCULAR | Status: AC
  Filled 2017-08-15: qty 2

## 2017-08-15 MED ORDER — LACTATED RINGERS IV SOLN
INTRAVENOUS | Status: DC
  Administered 2017-08-15 (×2): via INTRAVENOUS

## 2017-08-15 MED ORDER — CIPROFLOXACIN IN D5W 400 MG/200ML IV SOLN
400.0000 mg | INTRAVENOUS | Status: AC
  Administered 2017-08-15: 400 mg via INTRAVENOUS

## 2017-08-15 MED ORDER — ONDANSETRON HCL 4 MG/2ML IJ SOLN
INTRAMUSCULAR | Status: DC | PRN
  Administered 2017-08-15: 4 mg via INTRAVENOUS

## 2017-08-15 MED ORDER — MIDAZOLAM HCL 2 MG/2ML IJ SOLN
INTRAMUSCULAR | Status: AC
  Filled 2017-08-15: qty 2

## 2017-08-15 MED ORDER — CIPROFLOXACIN IN D5W 400 MG/200ML IV SOLN
INTRAVENOUS | Status: AC
  Filled 2017-08-15: qty 200

## 2017-08-15 MED ORDER — PROPOFOL 10 MG/ML IV BOLUS
INTRAVENOUS | Status: DC | PRN
  Administered 2017-08-15: 100 mg via INTRAVENOUS
  Administered 2017-08-15: 50 mg via INTRAVENOUS

## 2017-08-15 MED ORDER — LIDOCAINE HCL (PF) 2 % IJ SOLN
INTRAMUSCULAR | Status: AC
  Filled 2017-08-15: qty 12

## 2017-08-15 MED ORDER — BUPIVACAINE HCL (PF) 0.5 % IJ SOLN
INTRAMUSCULAR | Status: DC | PRN
  Administered 2017-08-15: 30 mL

## 2017-08-15 MED ORDER — BUPIVACAINE HCL (PF) 0.5 % IJ SOLN
INTRAMUSCULAR | Status: AC
  Filled 2017-08-15: qty 30

## 2017-08-15 MED ORDER — HYDROCODONE-ACETAMINOPHEN 5-325 MG PO TABS
1.0000 | ORAL_TABLET | ORAL | Status: DC | PRN
  Administered 2017-08-15: 1 via ORAL

## 2017-08-15 MED ORDER — LIDOCAINE HCL (CARDIAC) 20 MG/ML IV SOLN
INTRAVENOUS | Status: DC | PRN
  Administered 2017-08-15: 100 mg via INTRAVENOUS

## 2017-08-15 MED ORDER — FENTANYL CITRATE (PF) 100 MCG/2ML IJ SOLN
INTRAMUSCULAR | Status: AC
  Administered 2017-08-15: 25 ug via INTRAVENOUS
  Filled 2017-08-15: qty 2

## 2017-08-15 SURGICAL SUPPLY — 12 items
BRUSH SCRUB EZ 1% IODOPHOR (MISCELLANEOUS) ×2 IMPLANT
CATH FOLEY 2WAY  5CC 16FR (CATHETERS) ×1
CATH ROBINSON RED A/P 18FR (CATHETERS) ×2 IMPLANT
CATH URTH 16FR FL 2W BLN LF (CATHETERS) ×1 IMPLANT
ELECT REM PT RETURN 9FT ADLT (ELECTROSURGICAL) ×2
ELECTRODE REM PT RTRN 9FT ADLT (ELECTROSURGICAL) ×1 IMPLANT
GLOVE BIO SURGEON STRL SZ 6.5 (GLOVE) ×2 IMPLANT
PACK CYSTO AR (MISCELLANEOUS) ×2 IMPLANT
SET CYSTO W/LG BORE CLAMP LF (SET/KITS/TRAYS/PACK) ×2 IMPLANT
SYR 30ML LL (SYRINGE) ×2 IMPLANT
SYRINGE 10CC LL (SYRINGE) ×2 IMPLANT
WATER STERILE IRR 3000ML UROMA (IV SOLUTION) ×2 IMPLANT

## 2017-08-15 NOTE — Transfer of Care (Signed)
Immediate Anesthesia Transfer of Care Note  Patient: Holly Green  Procedure(s) Performed: Procedure(s): CYSTOSCOPY/HYDRODISTENSION (N/A)  Patient Location: PACU  Anesthesia Type:General  Level of Consciousness: awake  Airway & Oxygen Therapy: Patient Spontanous Breathing  Post-op Assessment: Report given to RN  Post vital signs: stable  Last Vitals:  Vitals:   08/15/17 0919 08/15/17 1058  BP: (!) 178/89 (!) 147/83  Pulse: 72 71  Resp: 18 16  Temp: (!) 36.2 C (!) 36.2 C  SpO2: 100% 97%    Last Pain:  Vitals:   08/15/17 1058  TempSrc:   PainSc: 6          Complications: No apparent anesthesia complications

## 2017-08-15 NOTE — Anesthesia Procedure Notes (Signed)
Procedure Name: LMA Insertion Date/Time: 08/15/2017 10:24 AM Performed by: Leander Rams Pre-anesthesia Checklist: Patient identified, Emergency Drugs available, Suction available and Patient being monitored Patient Re-evaluated:Patient Re-evaluated prior to induction Oxygen Delivery Method: Circle system utilized Preoxygenation: Pre-oxygenation with 100% oxygen Induction Type: IV induction Ventilation: Mask ventilation without difficulty LMA Size: 4.0

## 2017-08-15 NOTE — Interval H&P Note (Signed)
History and Physical Interval Note:  08/15/2017 9:59 AM  [name]  has presented today for surgery, with the diagnosis of Pelvic pain Chronic cystitis  The various methods of treatment have been discussed with the patient and family. After consideration of risks, benefits and other options for treatment, the patient has consented to  Procedure(s): CYSTOSCOPY/HYDRODISTENSION (N/A) as a surgical intervention .  The patient's history has been reviewed, patient examined, no change in status, stable for surgery.  I have reviewed the patient's chart and labs.  Questions were answered to the patient's satisfaction.    RRR Lungs clear  Holly Green

## 2017-08-15 NOTE — Op Note (Signed)
Date of procedure: 08/15/17  Preoperative diagnosis:  1. Chronic pelvic pain syndrome   Postoperative diagnosis:  1. Chronic pelvic pain syndrome   Procedure: 1. Cystoscopy 2. Hydrodistention 3. Installation of 0.5% Marcaine intravesically  Surgeon: Baruch Gouty, MD  Anesthesia: General  Complications: None  Intraoperative findings: The patient's bladder was hydrodistended to 900 and 1,000 cc for 10 and 5 minutes respectively. There were petechiae hemorrhages after the first hydrodistention. 30 cc of half percent Marcaine were instilled intravesically at the end of the procedure.  EBL: None  Specimens: None  Drains: None  Disposition: Stable to the postanesthesia care unit  Indication for procedure: The patient is a 71 y.o. female with pelvic plane, urinary frequency, and dysuria presents today for hydrodistention after failing medical management.  After reviewing the management options for treatment, the patient elected to proceed with the above surgical procedure(s). We have discussed the potential benefits and risks of the procedure, side effects of the proposed treatment, the likelihood of the patient achieving the goals of the procedure, and any potential problems that might occur during the procedure or recuperation. Informed consent has been obtained.  Description of procedure: The patient was met in the preoperative area. All risks, benefits, and indications of the procedure were described in great detail. The patient consented to the procedure. Preoperative antibiotics were given. The patient was taken to the operative theater. General anesthesia was induced per the anesthesia service. The patient was then placed in the dorsal lithotomy position and prepped and draped in the usual sterile fashion. A preoperative timeout was called.   A 20 French 30 cystoscope was inserted into the patient's bladder per urethra atraumatically. The patient's bladder was then drained.  Hydrodistention then began we'll pan cystoscopy was performed. Pan cystoscopy was unremarkable. Patient's bladder was then distended and held at maximal distention for approximately 10 minutes. It was then drained for 900 cc. This was then repeated for 5 minutes for a volume of 1000 cc. The patient did have petechiae hemorrhages throughout the bladder after the first hydrodistention. At this point, the patient's bladder was drained and cystoscope removed. Through a red rubber catheter 30 cc of half percent Marcaine was then instilled and the catheter removed. The patient was awoke from anesthesia and transferred in stable condition to the post anesthesia care unit.  Plan: The patient will follow-up in one month with her primary urologist, Dr. Matilde Sprang, for symptom follow up.  Baruch Gouty, M.D.

## 2017-08-15 NOTE — Anesthesia Post-op Follow-up Note (Signed)
Anesthesia QCDR form completed.        

## 2017-08-15 NOTE — Discharge Instructions (Signed)
Cystoscopy, Care After °Refer to this sheet in the next few weeks. These instructions provide you with information about caring for yourself after your procedure. Your health care provider may also give you more specific instructions. Your treatment has been planned according to current medical practices, but problems sometimes occur. Call your health care provider if you have any problems or questions after your procedure. °What can I expect after the procedure? °After the procedure, it is common to have: °· Mild pain when you urinate. Pain should stop within a few minutes after you urinate. This may last for up to 1 week. °· A small amount of blood in your urine for several days. °· Feeling like you need to urinate but producing only a small amount of urine. ° °Follow these instructions at home: ° °Medicines °· Take over-the-counter and prescription medicines only as told by your health care provider. °· If you were prescribed an antibiotic medicine, take it as told by your health care provider. Do not stop taking the antibiotic even if you start to feel better. °General instructions ° °· Return to your normal activities as told by your health care provider. Ask your health care provider what activities are safe for you. °· Do not drive for 24 hours if you received a sedative. °· Watch for any blood in your urine. If the amount of blood in your urine increases, call your health care provider. °· Follow instructions from your health care provider about eating or drinking restrictions. °· If a tissue sample was removed for testing (biopsy) during your procedure, it is your responsibility to get your test results. Ask your health care provider or the department performing the test when your results will be ready. °· Drink enough fluid to keep your urine clear or pale yellow. °· Keep all follow-up visits as told by your health care provider. This is important. °Contact a health care provider if: °· You have pain that  gets worse or does not get better with medicine, especially pain when you urinate. °· You have difficulty urinating. °Get help right away if: °· You have more blood in your urine. °· You have blood clots in your urine. °· You have abdominal pain. °· You have a fever or chills. °· You are unable to urinate. °This information is not intended to replace advice given to you by your health care provider. Make sure you discuss any questions you have with your health care provider. °Document Released: 05/31/2005 Document Revised: 04/18/2016 Document Reviewed: 09/28/2015 °Elsevier Interactive Patient Education © 2017 [employer] ° °AMBULATORY SURGERY  °DISCHARGE INSTRUCTIONS ° ° °1) The drugs that you were given will stay in your system until tomorrow so for the next 24 hours you should not: ° °A) Drive an automobile °B) Make any legal decisions °C) Drink any alcoholic beverage ° ° °2) You may resume regular meals tomorrow.  Today it is better to start with liquids and gradually work up to solid foods. ° °You may eat anything you prefer, but it is better to start with liquids, then soup and crackers, and gradually work up to solid foods. ° ° °3) Please notify your doctor immediately if you have any unusual bleeding, trouble breathing, redness and pain at the surgery site, drainage, fever, or pain not relieved by medication. ° ° ° °4) Additional Instructions: ° ° ° ° ° ° ° °Please contact your physician with any problems or Same Day Surgery at [phone], Monday through Friday 6 am to 4   pm, or [place] [facility] [place] Main number at [phone]. °

## 2017-08-15 NOTE — H&P (View-Only) (Signed)
08/06/2017 10:16 AM   Holly Green 04-23-1946 [id number]  Referring provider: Arnetha Courser, MD [address] [place], [place] [address]  Chief Complaint  Patient presents with  . Follow-up    Suprapubic pain  . Urinary Incontinence    HPI: Holly Green: Patient is a 71 year old African American female who is referred by Dr. Arnetha Courser for chronic suprapubic pain. She states she has pain x 3 months. It is located in the suprapubic area and radiates to her sides with the left >right. She describes the pain as cramping in sensation. At first, the pain occurred after urination. The pain is now occurring with the urge to urinate, but it is still worse after urinating. She was given an antibiotics on 02/20/2017 with no relief of symptoms. She has nocturia x 3, frequency x 4-8, urge incontinence x 0-3. Contrast CT performed on 03/03/2017 normal  She is experiencing frequency of urination, pain in her stomach when urinating and nocturia. She is still experiencing left sided suprapubic pain that radiates into her left lower quadrant. She states the pain is present when she has the urge to urinate and intensifies once she empties her bladder. Vesicare failed  Today Frequency is stable The patient still has a left lower quadrant pain worse after urination. Clinically noninfected today  Cystoscopy: The patient after written consent underwent flexible cystoscopy utilizing sterile technique. Bladder mucosa and trigone were normal. There was no carcinoma. There was no cystitis. There is no pain with bladder filling. She tolerated the procedure very well. Urethral examination was normal    The patient voids every 30-60 minutes in the morning but every 2 hours in the afternoon. She primarily goes because her bladder feels full but then she says that the pain starts. My index of suspicion is low that she has interstitial cystitis. I did not discuss a hydrodistention.  Recognizing limitations and therapy the role of the beta 3 agonist was discussed. I may discuss a hydrodistention next time.   Today Frequency stable The patient has failed Vesicare in the beta 3 agonist. She describes the pain worse in the morning and more on the left side. She can have it without or with urination. She has more frequency in the morning.      PMH: Past Medical History:  Diagnosis Date  . Abdominal aortic atherosclerosis ([place]) 03/05/2017   CT scan April 2018  . Arthritis   . Bleeding disorder ([place])   . GERD (gastroesophageal reflux disease)   . History of stomach ulcers   . Hyperlipidemia   . Hypertension   . Osteoporosis   . Prediabetes 10/15/2015  . Spondylolisthesis at L4-L5 level 03/07/2017    Surgical History: Past Surgical History:  Procedure Laterality Date  . ABDOMINAL HYSTERECTOMY    . BACK SURGERY  2010  . BREAST CYST ASPIRATION Right 1990's  . FOOT SURGERY      Home Medications:  Allergies as of 08/06/2017   No Known Allergies     Medication List       Accurate as of 08/06/17 10:16 AM. Always use your most recent med list.          bisoprolol-hydrochlorothiazide 10-6.25 MG tablet Commonly known as:  ZIAC Take 1 tablet by mouth daily.   Calcium-Vitamin D 600-400 MG-UNIT Tabs Take 1 tablet by mouth daily.   lisinopril 5 MG tablet Commonly known as:  PRINIVIL,ZESTRIL Take 1 tablet (5 mg total) by mouth daily. For BP and kidneys  lovastatin 20 MG tablet Commonly known as:  MEVACOR Take 1 tablet (20 mg total) by mouth at bedtime.   mirabegron ER 50 MG Tb24 tablet Commonly known as:  MYRBETRIQ Take 1 tablet (50 mg total) by mouth daily.   ranitidine 150 MG tablet Commonly known as:  ZANTAC Take 1 tablet (150 mg total) by mouth 2 (two) times daily. If needed for heartburn, acid reflux       Allergies: No Known Allergies  Family History: Family History  Problem Relation Age of Onset  . Hypertension Brother   . Heart  disease Brother   . Heart failure Brother   . Hypertension Mother   . Stroke Mother   . Stroke Father   . Prostate cancer Father   . Aneurysm Sister   . Breast cancer Sister 74  . Liver disease Maternal Aunt   . Heart failure Paternal Aunt   . Heart disease Paternal Uncle   . Heart attack Brother   . Heart disease Brother   . Breast cancer Maternal Aunt   . Heart disease Paternal Aunt   . Kidney cancer Neg Hx   . Bladder Cancer Neg Hx     Social History:  reports that she has quit smoking. She has never used smokeless tobacco. She reports that she does not drink alcohol or use drugs.  ROS: UROLOGY Frequent Urination?: Yes Hard to postpone urination?: No Burning/pain with urination?: Yes Get up at night to urinate?: Yes Leakage of urine?: No Urine stream starts and stops?: No Trouble starting stream?: No Do you have to strain to urinate?: No Blood in urine?: No Urinary tract infection?: No Sexually transmitted disease?: No Injury to kidneys or bladder?: No Painful intercourse?: No Weak stream?: No Currently pregnant?: No Vaginal bleeding?: No Last menstrual period?: n  Gastrointestinal Nausea?: No Vomiting?: No Indigestion/heartburn?: No Diarrhea?: No Constipation?: No  Constitutional Fever: No Night sweats?: No Weight loss?: No Fatigue?: No  Skin Skin rash/lesions?: No Itching?: No  Eyes Blurred vision?: No Double vision?: No  Ears/Nose/Throat Sore throat?: No Sinus problems?: Yes  Hematologic/Lymphatic Swollen glands?: No Easy bruising?: No  Cardiovascular Leg swelling?: No Chest pain?: No  Respiratory Cough?: Yes Shortness of breath?: No  Endocrine Excessive thirst?: No  Musculoskeletal Back pain?: Yes Joint pain?: Yes  Neurological Headaches?: Yes Dizziness?: No  Psychologic Depression?: No Anxiety?: No  Physical Exam: BP (!) 183/101 (BP Location: Right Arm, Patient Position: Sitting, Cuff Size: Normal)   Pulse 66   Ht  5\' 6"  (1.676 m)   Wt 175 lb (79.4 kg)   BMI 28.25 kg/m   Constitutional:  Alert and oriented, No acute distress.  Laboratory Data: Lab Results  Component Value Date   WBC 4.8 02/20/2017   HGB 11.5 (L) 02/20/2017   HCT 35.5 02/20/2017   MCV 87.4 02/20/2017   PLT 221 02/20/2017    Lab Results  Component Value Date   CREATININE 0.69 06/06/2017    No results found for: PSA  No results found for: TESTOSTERONE  Lab Results  Component Value Date   HGBA1C 5.7 (H) 06/06/2017    Urinalysis    Component Value Date/Time   COLORURINE STRAW (A) 08/08/2015 1213   APPEARANCEUR Cloudy (A) 06/16/2017 0951   LABSPEC 1.005 08/08/2015 1213   LABSPEC 1.020 01/11/2014 1139   PHURINE 8.0 08/08/2015 1213   GLUCOSEU Negative 06/16/2017 0951   GLUCOSEU 150 mg/dL 01/11/2014 1139   HGBUR NEGATIVE 08/08/2015 1213   BILIRUBINUR Negative 06/16/2017 0951  BILIRUBINUR Negative 01/11/2014 [place] 08/08/2015 1213   PROTEINUR Negative 06/16/2017 [place] 08/08/2015 1213   UROBILINOGEN negative 02/20/2017 1201   UROBILINOGEN 0.2 09/27/2009 0843   NITRITE Negative 06/16/2017 0951   NITRITE NEGATIVE 08/08/2015 1213   LEUKOCYTESUR Negative 06/16/2017 0951   LEUKOCYTESUR Negative 01/11/2014 1139    Pertinent Imaging: none  Assessment & Plan:  The patient may have a morning diuresis aggravating his symptoms or not. My index of suspicion is mild to moderate that she could have interstitial cystitis. The pain can be quite significant in the morning but is less severe in the afternoon. At some time she said is worse with movement which would not be her bladder. We talked about a hydrodistention in detail with pros cons and risks with my usual template as noted. Rare sequelae discussed. I think it is very reasonable based upon severity and chronicity of symptoms to undergo hydrodistention. I believe the hydrodistention is negative she does not have interstitial  cystitis  There are no diagnoses linked to this encounter.  No Follow-up on file.  Reece Packer, MD  [facility] [address] [place], [place] [address] [phone]

## 2017-08-15 NOTE — Anesthesia Preprocedure Evaluation (Addendum)
Anesthesia Evaluation  Patient identified by MRN, date of birth, ID band Patient awake    Reviewed: Allergy & Precautions, NPO status , Patient's Chart, lab work & pertinent test results, reviewed documented beta blocker date and time   Airway Mallampati: II  TM Distance: >3 FB     Dental  (+) Chipped, Missing   Pulmonary former smoker,           Cardiovascular hypertension, Pt. on medications and Pt. on home beta blockers + Peripheral Vascular Disease  + Valvular Problems/Murmurs      Neuro/Psych    GI/Hepatic GERD  Controlled,  Endo/Other    Renal/GU Renal disease     Musculoskeletal  (+) Arthritis ,   Abdominal   Peds  Hematology   Anesthesia Other Findings IRbbb. No cardiac symptoms, will proceed.  Reproductive/Obstetrics                           Anesthesia Physical Anesthesia Plan  ASA: III  Anesthesia Plan: General   Post-op Pain Management:    Induction: Intravenous  PONV Risk Score and Plan:   Airway Management Planned: LMA  Additional Equipment:   Intra-op Plan:   Post-operative Plan:   Informed Consent: I have reviewed the patients History and Physical, chart, labs and discussed the procedure including the risks, benefits and alternatives for the proposed anesthesia with the patient or authorized representative who has indicated his/her understanding and acceptance.     Plan Discussed with: CRNA  Anesthesia Plan Comments:         Anesthesia Quick Evaluation

## 2017-08-15 NOTE — Anesthesia Postprocedure Evaluation (Signed)
Anesthesia Post Note  Patient: Holly Green  Procedure(s) Performed: Procedure(s) (LRB): CYSTOSCOPY/HYDRODISTENSION (N/A)  Patient location during evaluation: PACU Anesthesia Type: General Level of consciousness: awake and alert Pain management: pain level controlled Vital Signs Assessment: post-procedure vital signs reviewed and stable Respiratory status: spontaneous breathing, nonlabored ventilation, respiratory function stable and patient connected to nasal cannula oxygen Cardiovascular status: blood pressure returned to baseline and stable Postop Assessment: no apparent nausea or vomiting Anesthetic complications: no     Last Vitals:  Vitals:   08/15/17 1142 08/15/17 1214  BP: (!) 180/86 (!) 188/89  Pulse: (!) 59 61  Resp: 15 15  Temp: (!) 35.7 C   SpO2: 100% 97%    Last Pain:  Vitals:   08/15/17 1214  TempSrc:   PainSc: [place]

## 2017-08-16 ENCOUNTER — Encounter: Payer: Self-pay | Admitting: Urology

## 2017-08-18 ENCOUNTER — Telehealth: Payer: Self-pay | Admitting: Urology

## 2017-08-18 NOTE — Telephone Encounter (Signed)
App has been made ° ° °Holly Green °

## 2017-08-18 NOTE — Telephone Encounter (Signed)
-----   Message from Nickie Retort, MD sent at 08/15/2017 10:54 AM EDT ----- Patient needs to f/u with either Dr. Matilde Sprang or Larene Beach in one month for symptom follow up. Thanks.

## 2017-08-28 ENCOUNTER — Encounter: Payer: Self-pay | Admitting: Family Medicine

## 2017-08-28 ENCOUNTER — Ambulatory Visit (INDEPENDENT_AMBULATORY_CARE_PROVIDER_SITE_OTHER): Payer: Medicare Other | Admitting: Family Medicine

## 2017-08-28 VITALS — BP 130/80 | HR 68 | Temp 98.1°F | Resp 16 | Ht 66.0 in | Wt 175.8 lb

## 2017-08-28 DIAGNOSIS — J309 Allergic rhinitis, unspecified: Secondary | ICD-10-CM | POA: Diagnosis not present

## 2017-08-28 DIAGNOSIS — J343 Hypertrophy of nasal turbinates: Secondary | ICD-10-CM | POA: Diagnosis not present

## 2017-08-28 MED ORDER — FLUTICASONE PROPIONATE 50 MCG/ACT NA SUSP: 2.0000 | Freq: Every day | NASAL | 2 refills | Status: DC

## 2017-08-28 MED ORDER — LEVOCETIRIZINE DIHYDROCHLORIDE 5 MG PO TABS: 5.0000 mg | ORAL_TABLET | Freq: Every evening | ORAL | 0 refills | Status: DC

## 2017-08-28 NOTE — Patient Instructions (Addendum)
Drink plenty of water while taking antihistamine medication.Allergic Rhinitis Allergic rhinitis is when the mucous membranes in the nose respond to allergens. Allergens are particles in the air that cause your body to have an allergic reaction. This causes you to release allergic antibodies. Through a chain of events, these eventually cause you to release histamine into the blood stream. Although meant to protect the body, it is this release of histamine that causes your discomfort, such as frequent sneezing, congestion, and an itchy, runny nose. What are the causes? Seasonal allergic rhinitis (hay fever) is caused by pollen allergens that may come from grasses, trees, and weeds. Year-round allergic rhinitis (perennial allergic rhinitis) is caused by allergens such as house dust mites, pet dander, and mold spores. What are the signs or symptoms?  Nasal stuffiness (congestion).  Itchy, runny nose with sneezing and tearing of the eyes. How is this diagnosed? Your health care provider can help you determine the allergen or allergens that trigger your symptoms. If you and your health care provider are unable to determine the allergen, skin or blood testing may be used. Your health care provider will diagnose your condition after taking your health history and performing a physical exam. Your health care provider may assess you for other related conditions, such as asthma, pink eye, or an ear infection. How is this treated? Allergic rhinitis does not have a cure, but it can be controlled by:  Medicines that block allergy symptoms. These may include allergy shots, nasal sprays, and oral antihistamines.  Avoiding the allergen.  Hay fever may often be treated with antihistamines in pill or nasal spray forms. Antihistamines block the effects of histamine. There are over-the-counter medicines that may help with nasal congestion and swelling around the eyes. Check with your health care provider before taking  or giving this medicine. If avoiding the allergen or the medicine prescribed do not work, there are many new medicines your health care provider can prescribe. Stronger medicine may be used if initial measures are ineffective. Desensitizing injections can be used if medicine and avoidance does not work. Desensitization is when a patient is given ongoing shots until the body becomes less sensitive to the allergen. Make sure you follow up with your health care provider if problems continue. Follow these instructions at home: It is not possible to completely avoid allergens, but you can reduce your symptoms by taking steps to limit your exposure to them. It helps to know exactly what you are allergic to so that you can avoid your specific triggers. Contact a health care provider if:  You have a fever.  You develop a cough that does not stop easily (persistent).  You have shortness of breath.  You start wheezing.  Symptoms interfere with normal daily activities. This information is not intended to replace advice given to you by your health care provider. Make sure you discuss any questions you have with your health care provider. Document Released: 08/06/2001 Document Revised: 07/12/2016 Document Reviewed: 07/19/2013 Elsevier Interactive Patient Education  2017 [employer].

## 2017-08-28 NOTE — Progress Notes (Signed)
Name: Holly Green   MRN: [id number]    DOB: [birth year]   Date:08/28/2017       Progress Note  Subjective  Chief Complaint  Chief Complaint  Patient presents with  . Allergies    HPI  Patient presents with complaint of sinus congestion, productive cough ("mucus"), sneezing, and sinus pressure for several months.  Has history of allergies in the past and has been taking over the counter allergy medication but she is not sure which medication.  She denies shortness of breath, chest pain, fevers/chills, ear pain/pressure, or sinus pain.  Patient Active Problem List   Diagnosis Date Noted  . Left flank pain, chronic 03/07/2017  . Spondylolisthesis at L4-L5 level 03/07/2017  . Abdominal aortic atherosclerosis ([place]) 03/05/2017  . Simple cyst of kidney 03/05/2017  . Benign liver cyst 03/05/2017  . Medication monitoring encounter 11/22/2016  . GERD (gastroesophageal reflux disease) 08/07/2016  . Osteoporosis 05/07/2016  . Cardiac murmur 05/07/2016  . Prediabetes 10/15/2015  . Hyperlipidemia 10/15/2015  . Essential hypertension 10/15/2015  . Diverticulosis of colon, acquired 10/15/2015    Social History  Substance Use Topics  . Smoking status: Former Research scientist (life sciences)  . Smokeless tobacco: Never Used     Comment: teenage years about 1 year  . Alcohol use No     Current Outpatient Prescriptions:  .  bisoprolol-hydrochlorothiazide (ZIAC) 10-6.25 MG tablet, Take 1 tablet by mouth daily., Disp: 90 tablet, Rfl: 2 .  Calcium Carb-Cholecalciferol (CALCIUM-VITAMIN D) 600-400 MG-UNIT TABS, Take 1 tablet by mouth daily., Disp: , Rfl:  .  cetirizine (ZYRTEC) 10 MG tablet, Take 10 mg by mouth daily as needed for allergies., Disp: , Rfl:  .  conjugated estrogens (PREMARIN) vaginal cream, Place 1 Applicatorful vaginally every Monday, Wednesday, and Friday., Disp: , Rfl:  .  diphenhydramine-acetaminophen (TYLENOL PM) 25-500 MG TABS tablet, Take 1 tablet by mouth at bedtime as needed (sleep)., Disp: ,  Rfl:  .  HYDROcodone-acetaminophen (NORCO) 5-325 MG tablet, Take 1 tablet by mouth every 4 (four) hours as needed for moderate pain., Disp: 10 tablet, Rfl: 0 .  lisinopril (PRINIVIL,ZESTRIL) 5 MG tablet, Take 1 tablet (5 mg total) by mouth daily. For BP and kidneys, Disp: 30 tablet, Rfl: 10 .  lovastatin (MEVACOR) 20 MG tablet, Take 1 tablet (20 mg total) by mouth at bedtime., Disp: 90 tablet, Rfl: 3 .  ranitidine (ZANTAC) 150 MG tablet, Take 1 tablet (150 mg total) by mouth 2 (two) times daily. If needed for heartburn, acid reflux (Patient taking differently: Take 150 mg by mouth 2 (two) times daily as needed for heartburn. If needed for heartburn, acid reflux), Disp: 60 tablet, Rfl: 2 .  cephALEXin (KEFLEX) 500 MG capsule, Take 1 capsule (500 mg total) by mouth 3 (three) times daily. (Patient not taking: Reported on 08/28/2017), Disp: 6 capsule, Rfl: 0 .  mirabegron ER (MYRBETRIQ) 50 MG TB24 tablet, Take 1 tablet (50 mg total) by mouth daily. (Patient not taking: Reported on 08/28/2017), Disp: 30 tablet, Rfl: 11  Allergies  Allergen Reactions  . Aspirin Other (See Comments)    Diverticulosis    ROS  Ten systems reviewed and is negative except as mentioned in HPI  Objective  Vitals:   08/28/17 1319  BP: 130/80  Pulse: 68  Resp: 16  Temp: 98.1 F (36.7 C)  TempSrc: Oral  SpO2: 98%  Weight: 175 lb 12.8 oz (79.7 kg)  Height: 5\' 6"  (1.676 m)   Body mass index is 28.37 kg/m.  Nursing Note  and Vital Signs reviewed.  Physical Exam  Constitutional: Patient appears well-developed and well-nourished. Obese No distress.  HEENT: head atraumatic, normocephalic, pupils equal and reactive to light, EOM's intact, TM's without erythema or bulging, no maxillary or frontal sinus pain on palpation, bilateral turbinates are hypertrophied, neck supple without lymphadenopathy, oropharynx pink and moist without exudate Cardiovascular: Normal rate, regular rhythm, S1/S2 present.  No murmur or rub  heard. No BLE edema. Pulmonary/Chest: Effort normal and breath sounds clear. No respiratory distress or retractions. Psychiatric: Patient has a normal mood and affect. behavior is normal. Judgment and thought content normal.  Recent Results (from the past 2160 hour(s))  Hemoglobin A1c     Status: Abnormal   Collection Time: 06/06/17  9:38 AM  Result Value Ref Range   Hgb A1c MFr Bld 5.7 (H) <5.7 %    Comment:   For someone without known diabetes, a hemoglobin A1c value between 5.7% and 6.4% is consistent with prediabetes and should be confirmed with a follow-up test.   For someone with known diabetes, a value <7% indicates that their diabetes is well controlled. A1c targets should be individualized based on duration of diabetes, age, co-morbid conditions and other considerations.   This assay result is consistent with an increased risk of diabetes.   Currently, no consensus exists regarding use of hemoglobin A1c for diagnosis of diabetes in children.      Mean Plasma Glucose 117 mg/dL  Lipid panel     Status: None   Collection Time: 06/06/17  9:38 AM  Result Value Ref Range   Cholesterol 175 <200 mg/dL   Triglycerides 38 <150 mg/dL   HDL 92 >50 mg/dL   Total CHOL/HDL Ratio 1.9 <5.0 Ratio   VLDL 8 <30 mg/dL   LDL Cholesterol 75 <100 mg/dL  COMPLETE METABOLIC PANEL WITH GFR     Status: None   Collection Time: 06/06/17  9:38 AM  Result Value Ref Range   Sodium 138 135 - 146 mmol/L   Potassium 3.7 3.5 - 5.3 mmol/L   Chloride 102 98 - 110 mmol/L   CO2 24 20 - 31 mmol/L   Glucose, Bld 90 65 - 99 mg/dL   BUN 17 7 - 25 mg/dL   Creat 0.69 0.50 - 0.99 mg/dL    Comment:   For patients > or = 70 years of age: The upper reference limit for Creatinine is approximately 13% higher for people identified as African-American.      Total Bilirubin 1.2 0.2 - 1.2 mg/dL   Alkaline Phosphatase 89 33 - 130 U/L   AST 21 10 - 35 U/L   ALT 14 6 - 29 U/L   Total Protein 6.7 6.1 - 8.1 g/dL    Albumin 3.9 3.6 - 5.1 g/dL   Calcium 9.3 8.6 - 10.4 mg/dL   GFR, Est African American >89 >=60 mL/min   GFR, Est Non African American 89 >=60 mL/min  Urinalysis, Complete     Status: Abnormal   Collection Time: 06/16/17  9:51 AM  Result Value Ref Range   Specific Gravity, UA 1.015 1.005 - 1.030   pH, UA 7.0 5.0 - 7.5   Color, UA Yellow Yellow   Appearance Ur Cloudy (A) Clear   Leukocytes, UA Negative Negative   Protein, UA Negative Negative/Trace   Glucose, UA Negative Negative   Ketones, UA Negative Negative   RBC, UA Trace (A) Negative   Bilirubin, UA Negative Negative   Urobilinogen, Ur 0.2 0.2 - 1.0 mg/dL  Nitrite, UA Negative Negative   Microscopic Examination See below:   Microscopic Examination     Status: Abnormal   Collection Time: 06/16/17  9:51 AM  Result Value Ref Range   WBC, UA None seen 0 - 5 /hpf   RBC, UA None seen 0 - 2 /hpf   Epithelial Cells (non renal) 0-10 0 - 10 /hpf   Bacteria, UA Few (A) None seen/Few  CBC     Status: None   Collection Time: 08/12/17 12:41 PM  Result Value Ref Range   WBC 5.1 3.6 - 11.0 K/uL   RBC 4.08 3.80 - 5.20 MIL/uL   Hemoglobin 12.3 12.0 - 16.0 g/dL   HCT 35.8 35.0 - 47.0 %   MCV 87.7 80.0 - 100.0 fL   MCH 30.1 26.0 - 34.0 pg   MCHC 34.3 32.0 - 36.0 g/dL   RDW 14.5 11.5 - 14.5 %   Platelets 212 150 - 440 K/uL  Potassium     Status: None   Collection Time: 08/12/17 12:41 PM  Result Value Ref Range   Potassium 3.6 3.5 - 5.1 mmol/L  Glucose, capillary     Status: None   Collection Time: 08/15/17  9:36 AM  Result Value Ref Range   Glucose-Capillary 91 65 - 99 mg/dL     Assessment & Plan  1. Allergic rhinitis, unspecified seasonality, unspecified trigger - levocetirizine (XYZAL) 5 MG tablet; Take 1 tablet (5 mg total) by mouth every evening.  Dispense: 90 tablet; Refill: 0 - fluticasone (FLONASE) 50 MCG/ACT nasal spray; Place 2 sprays into both nostrils daily.  Dispense: 16 g; Refill: 2  2. Nasal turbinate  hypertrophy - fluticasone (FLONASE) 50 MCG/ACT nasal spray; Place 2 sprays into both nostrils daily.  Dispense: 16 g; Refill: 2  -Red flags and when to present for emergency care or RTC including fever >101.68F, chest pain, shortness of breath, new/worsening/un-resolving symptoms, reviewed with patient at time of visit. Follow up and care instructions discussed and provided in AVS.

## 2017-09-03 ENCOUNTER — Ambulatory Visit: Payer: Medicare Other

## 2017-09-03 VITALS — BP 177/105 | HR 66 | Ht 65.0 in | Wt 177.2 lb

## 2017-09-03 DIAGNOSIS — R1030 Lower abdominal pain, unspecified: Secondary | ICD-10-CM

## 2017-09-03 LAB — URINALYSIS, COMPLETE
Bilirubin, UA: NEGATIVE
Glucose, UA: NEGATIVE
Ketones, UA: NEGATIVE
Leukocytes, UA: NEGATIVE
Nitrite, UA: NEGATIVE
Protein, UA: NEGATIVE
RBC, UA: NEGATIVE
Specific Gravity, UA: 1.01 (ref 1.005–1.030)
Urobilinogen, Ur: 0.2 mg/dL (ref 0.2–1.0)
pH, UA: 7 (ref 5.0–7.5)

## 2017-09-03 LAB — BLADDER SCAN AMB NON-IMAGING: Scan Result: 110

## 2017-09-03 LAB — MICROSCOPIC EXAMINATION
RBC, UA: NONE SEEN /hpf (ref 0–?)
WBC, UA: NONE SEEN /hpf (ref 0–?)

## 2017-09-03 NOTE — Progress Notes (Signed)
Pt presents today with c/o lower abd pain post hydrodistention. A clean catch was obtained for u/a and cx. PVR was 110.   Blood pressure (!) 177/105, pulse 66, height 5\' 5"  (1.651 m), weight 177 lb 3.2 oz (80.4 kg).

## 2017-09-06 LAB — CULTURE, URINE COMPREHENSIVE

## 2017-09-12 ENCOUNTER — Ambulatory Visit: Payer: Medicare Other

## 2017-09-17 ENCOUNTER — Ambulatory Visit: Payer: Medicare Other

## 2017-09-19 ENCOUNTER — Ambulatory Visit (INDEPENDENT_AMBULATORY_CARE_PROVIDER_SITE_OTHER): Payer: Medicare Other | Admitting: Urology

## 2017-09-19 VITALS — BP 162/99 | HR 69 | Ht 65.0 in | Wt 173.8 lb

## 2017-09-19 DIAGNOSIS — R102 Pelvic and perineal pain: Secondary | ICD-10-CM | POA: Diagnosis not present

## 2017-09-19 NOTE — Progress Notes (Signed)
09/19/2017 3:06 PM   Holly Green [birth year] [id number]  Referring provider: Arnetha Courser, MD [address] [place], [place] [address]  Chief Complaint  Patient presents with  . Routine Post Op    HPI: The patient is a 71 year old female who presents for follow-up after undergoing a hydrodistention for chronic pelvic pain.  She had been following prior to the hydrodistention with Dr. Matilde Sprang with a low suspicion that her discomfort was a result of interstitial cystitis.  He arranged for undergo this hydrodistention to see if she had symptomatic improvement after this procedure.  If her symptoms did not resolve, the plan was to have ruled IC out as a source of her discomfort.  She notes a 90% improvement in her symptoms.  Her biggest complaint was that she had pain before and after urination.  She did not have pain with urination.  This is resolved.  Her frequency is now greatly reduced.  She voids approximately every 3-4 hours during the day.  She feels this hydrodistention was very helpful.  Background: She states she has pain x 3 months. It is located in the suprapubic area and radiates to her sides with the left >right. She describes the pain as cramping in sensation. At first, the pain occurred after urination. The pain is now occurring with the urge to urinate, but it is still worse after urinating. She was given an antibiotics on 02/20/2017 with no relief of symptoms. She has nocturia x 3, frequency x 4-8, urge incontinence x 0-3. Contrast CT performed on 03/03/2017 normal  Sheis experiencing frequency of urination, pain in her stomach when urinating and nocturia. She is still experiencing left sided suprapubic pain that radiates into her left lower quadrant. She states the pain is present when she has the urge to urinate and intensifies once she empties her bladder. Vesicare failed    PMH: Past Medical History:  Diagnosis Date  . Abdominal aortic  atherosclerosis ([place]) 03/05/2017   CT scan April 2018  . Arthritis   . Bleeding disorder ([place])   . Diverticulitis 2015  . GERD (gastroesophageal reflux disease)   . History of kidney stones   . History of stomach ulcers   . Hyperlipidemia   . Hypertension   . Osteoporosis   . Pre-diabetes   . Prediabetes 10/15/2015  . Spondylolisthesis at L4-L5 level 03/07/2017    Surgical History: Past Surgical History:  Procedure Laterality Date  . ABDOMINAL HYSTERECTOMY    . BACK SURGERY  2010  . BREAST CYST ASPIRATION Right 1990's  . CYSTO WITH HYDRODISTENSION N/A 08/15/2017   Procedure: CYSTOSCOPY/HYDRODISTENSION;  Surgeon: Nickie Retort, MD;  Location: [facility] ORS;  Service: Urology;  Laterality: N/A;  . FOOT SURGERY Right     Home Medications:  Allergies as of 09/19/2017      Reactions   Aspirin Other (See Comments)   Diverticulosis      Medication List       Accurate as of 09/19/17  3:06 PM. Always use your most recent med list.          bisoprolol-hydrochlorothiazide 10-6.25 MG tablet Commonly known as:  ZIAC Take 1 tablet by mouth daily.   Calcium-Vitamin D 600-400 MG-UNIT Tabs Take 1 tablet by mouth daily.   conjugated estrogens vaginal cream Commonly known as:  PREMARIN Place 1 Applicatorful vaginally every Monday, Wednesday, and Friday.   diphenhydramine-acetaminophen 25-500 MG Tabs tablet Commonly known as:  TYLENOL PM Take 1 tablet by mouth at bedtime  as needed (sleep).   fluticasone 50 MCG/ACT nasal spray Commonly known as:  FLONASE Place 2 sprays into both nostrils daily.   HYDROcodone-acetaminophen 5-325 MG tablet Commonly known as:  NORCO Take 1 tablet by mouth every 4 (four) hours as needed for moderate pain.   levocetirizine 5 MG tablet Commonly known as:  XYZAL Take 1 tablet (5 mg total) by mouth every evening.   lisinopril 5 MG tablet Commonly known as:  PRINIVIL,ZESTRIL Take 1 tablet (5 mg total) by mouth daily. For BP and kidneys     lovastatin 20 MG tablet Commonly known as:  MEVACOR Take 1 tablet (20 mg total) by mouth at bedtime.   ranitidine 150 MG tablet Commonly known as:  ZANTAC Take 1 tablet (150 mg total) by mouth 2 (two) times daily. If needed for heartburn, acid reflux       Allergies:  Allergies  Allergen Reactions  . Aspirin Other (See Comments)    Diverticulosis    Family History: Family History  Problem Relation Age of Onset  . Hypertension Brother   . Heart disease Brother   . Heart failure Brother   . Hypertension Mother   . Stroke Mother   . Stroke Father   . Prostate cancer Father   . Aneurysm Sister   . Breast cancer Sister 53  . Liver disease Maternal Aunt   . Heart failure Paternal Aunt   . Heart disease Paternal Uncle   . Heart attack Brother   . Heart disease Brother   . Breast cancer Maternal Aunt   . Heart disease Paternal Aunt   . Kidney cancer Neg Hx   . Bladder Cancer Neg Hx     Social History:  reports that she has quit smoking. She has never used smokeless tobacco. She reports that she does not drink alcohol or use drugs.  ROS: UROLOGY Frequent Urination?: No Hard to postpone urination?: No Burning/pain with urination?: No Get up at night to urinate?: No Leakage of urine?: No Urine stream starts and stops?: No Trouble starting stream?: No Do you have to strain to urinate?: No Blood in urine?: No Urinary tract infection?: No Sexually transmitted disease?: No Injury to kidneys or bladder?: No Painful intercourse?: No Weak stream?: No Currently pregnant?: No Vaginal bleeding?: No Last menstrual period?: n  Gastrointestinal Nausea?: No Vomiting?: No Indigestion/heartburn?: No Diarrhea?: No Constipation?: No  Constitutional Fever: No Night sweats?: No Weight loss?: No Fatigue?: No  Skin Skin rash/lesions?: No Itching?: No  Eyes Blurred vision?: No Double vision?: No  Ears/Nose/Throat Sore throat?: No Sinus problems?:  Yes  Hematologic/Lymphatic Swollen glands?: No Easy bruising?: No  Cardiovascular Leg swelling?: No Chest pain?: No  Respiratory Cough?: Yes Shortness of breath?: No  Endocrine Excessive thirst?: No  Musculoskeletal Back pain?: Yes Joint pain?: Yes  Neurological Headaches?: Yes Dizziness?: No  Psychologic Depression?: No Anxiety?: No  Physical Exam: BP (!) 162/99 (BP Location: Right Arm, Patient Position: Sitting, Cuff Size: Normal)   Pulse 69   Ht 5\' 5"  (1.651 m)   Wt 173 lb 12.8 oz (78.8 kg)   BMI 28.92 kg/m   Constitutional:  Alert and oriented, No acute distress. HEENT: [place] AT, moist mucus membranes.  Trachea midline, no masses. Cardiovascular: No clubbing, cyanosis, or edema. Respiratory: Normal respiratory effort, no increased work of breathing. GI: Abdomen is soft, nontender, nondistended, no abdominal masses GU: No CVA tenderness.  Skin: No rashes, bruises or suspicious lesions. Lymph: No cervical or inguinal adenopathy. Neurologic: Grossly intact,  no focal deficits, moving all 4 extremities. Psychiatric: Normal mood and affect.  Laboratory Data: Lab Results  Component Value Date   WBC 5.1 08/12/2017   HGB 12.3 08/12/2017   HCT 35.8 08/12/2017   MCV 87.7 08/12/2017   PLT 212 08/12/2017    Lab Results  Component Value Date   CREATININE 0.69 06/06/2017    No results found for: PSA  No results found for: TESTOSTERONE  Lab Results  Component Value Date   HGBA1C 5.7 (H) 06/06/2017    Urinalysis    Component Value Date/Time   COLORURINE STRAW (A) 08/08/2015 1213   APPEARANCEUR Clear 09/03/2017 1128   LABSPEC 1.005 08/08/2015 1213   LABSPEC 1.020 01/11/2014 1139   PHURINE 8.0 08/08/2015 1213   GLUCOSEU Negative 09/03/2017 1128   GLUCOSEU 150 mg/dL 01/11/2014 1139   HGBUR NEGATIVE 08/08/2015 1213   BILIRUBINUR Negative 09/03/2017 1128   BILIRUBINUR Negative 01/11/2014 1139   KETONESUR NEGATIVE 08/08/2015 1213   PROTEINUR Negative  09/03/2017 1128   PROTEINUR NEGATIVE 08/08/2015 1213   UROBILINOGEN negative 02/20/2017 1201   UROBILINOGEN 0.2 09/27/2009 0843   NITRITE Negative 09/03/2017 1128   NITRITE NEGATIVE 08/08/2015 1213   LEUKOCYTESUR Negative 09/03/2017 1128   LEUKOCYTESUR Negative 01/11/2014 1139    Assessment & Plan:    1.  Chronic pelvic pain syndrome The patient is significantly improved after hydrodistention.  Most of her symptoms have completely resolved.  I will have her follow-up with Zara Council, PA in 6 months for symptom check.  She will call if she has problems sooner.  She was warned that sometimes repeat hydrodistention's are needed in the future if symptoms return.  Return in about 6 months (around 03/20/2018) for Zara Council, [place].  Nickie Retort, MD  [facility] [address], [place], [place] [address] [phone]

## 2017-10-30 ENCOUNTER — Telehealth: Payer: Self-pay | Admitting: Family Medicine

## 2017-10-30 DIAGNOSIS — I10 Essential (primary) hypertension: Secondary | ICD-10-CM

## 2017-10-30 NOTE — Telephone Encounter (Signed)
Copied from [place] [phone]. Topic: Quick Communication - See Telephone Encounter >> Oct 30, 2017 12:51 PM Boyd Kerbs wrote: CRM for notification. See Telephone encounter for:   Patient is requesting refill on Bisoprolol, Blood Pressure Medicine.  Last pill is for Saturday.   Walker (N),  - [address] 10/30/17.

## 2017-10-31 ENCOUNTER — Other Ambulatory Visit: Payer: Self-pay | Admitting: Family Medicine

## 2017-10-31 DIAGNOSIS — I10 Essential (primary) hypertension: Secondary | ICD-10-CM

## 2017-10-31 MED ORDER — BISOPROLOL-HYDROCHLOROTHIAZIDE 10-6.25 MG PO TABS: 1.0000 | ORAL_TABLET | Freq: Every day | ORAL | 1 refills | Status: DC

## 2017-11-06 ENCOUNTER — Inpatient Hospital Stay
Admission: EM | Admit: 2017-11-06 | Discharge: 2017-11-09 | DRG: 379 | Disposition: A | Discharge status: Home / Self Care | Payer: Medicare Other | Attending: Internal Medicine | Admitting: Internal Medicine

## 2017-11-06 ENCOUNTER — Other Ambulatory Visit: Payer: Self-pay

## 2017-11-06 ENCOUNTER — Emergency Department: Payer: Medicare Other

## 2017-11-06 DIAGNOSIS — K219 Gastro-esophageal reflux disease without esophagitis: Secondary | ICD-10-CM | POA: Diagnosis present

## 2017-11-06 DIAGNOSIS — M810 Age-related osteoporosis without current pathological fracture: Secondary | ICD-10-CM | POA: Diagnosis present

## 2017-11-06 DIAGNOSIS — K922 Gastrointestinal hemorrhage, unspecified: Secondary | ICD-10-CM | POA: Diagnosis not present

## 2017-11-06 DIAGNOSIS — K5730 Diverticulosis of large intestine without perforation or abscess without bleeding: Secondary | ICD-10-CM

## 2017-11-06 DIAGNOSIS — R1032 Left lower quadrant pain: Secondary | ICD-10-CM | POA: Diagnosis not present

## 2017-11-06 DIAGNOSIS — K5731 Diverticulosis of large intestine without perforation or abscess with bleeding: Secondary | ICD-10-CM | POA: Diagnosis not present

## 2017-11-06 DIAGNOSIS — K921 Melena: Secondary | ICD-10-CM | POA: Diagnosis not present

## 2017-11-06 DIAGNOSIS — I700 Atherosclerosis of aorta: Secondary | ICD-10-CM | POA: Diagnosis present

## 2017-11-06 DIAGNOSIS — I10 Essential (primary) hypertension: Secondary | ICD-10-CM | POA: Diagnosis present

## 2017-11-06 DIAGNOSIS — R109 Unspecified abdominal pain: Secondary | ICD-10-CM | POA: Diagnosis not present

## 2017-11-06 DIAGNOSIS — E785 Hyperlipidemia, unspecified: Secondary | ICD-10-CM | POA: Diagnosis present

## 2017-11-06 DIAGNOSIS — Z87891 Personal history of nicotine dependence: Secondary | ICD-10-CM

## 2017-11-06 DIAGNOSIS — Z8719 Personal history of other diseases of the digestive system: Secondary | ICD-10-CM | POA: Diagnosis present

## 2017-11-06 DIAGNOSIS — R7303 Prediabetes: Secondary | ICD-10-CM | POA: Diagnosis present

## 2017-11-06 DIAGNOSIS — Z886 Allergy status to analgesic agent status: Secondary | ICD-10-CM | POA: Diagnosis not present

## 2017-11-06 DIAGNOSIS — Z8711 Personal history of peptic ulcer disease: Secondary | ICD-10-CM

## 2017-11-06 DIAGNOSIS — K5790 Diverticulosis of intestine, part unspecified, without perforation or abscess without bleeding: Secondary | ICD-10-CM | POA: Diagnosis not present

## 2017-11-06 DIAGNOSIS — K625 Hemorrhage of anus and rectum: Secondary | ICD-10-CM | POA: Diagnosis not present

## 2017-11-06 DIAGNOSIS — M1990 Unspecified osteoarthritis, unspecified site: Secondary | ICD-10-CM | POA: Diagnosis present

## 2017-11-06 HISTORY — DX: Personal history of other diseases of the digestive system: Z87.19

## 2017-11-06 LAB — COMPREHENSIVE METABOLIC PANEL
ALT: 17 U/L (ref 14–54)
AST: 25 U/L (ref 15–41)
Albumin: 3.6 g/dL (ref 3.5–5.0)
Alkaline Phosphatase: 87 U/L (ref 38–126)
Anion gap: 5 (ref 5–15)
BUN: 31 mg/dL — ABNORMAL HIGH (ref 6–20)
CO2: 28 mmol/L (ref 22–32)
Calcium: 9.3 mg/dL (ref 8.9–10.3)
Chloride: 103 mmol/L (ref 101–111)
Creatinine, Ser: 0.84 mg/dL (ref 0.44–1.00)
GFR calc Af Amer: >60 mL/min (ref >60)
GFR calc non Af Amer: >60 mL/min (ref >60)
Glucose, Bld: 108 mg/dL — ABNORMAL HIGH (ref 65–99)
Potassium: 3.7 mmol/L (ref 3.5–5.1)
Sodium: 136 mmol/L (ref 135–145)
Total Bilirubin: 1.2 mg/dL (ref 0.3–1.2)
Total Protein: 7 g/dL (ref 6.5–8.1)

## 2017-11-06 LAB — CBC
HCT: 34.1 % — ABNORMAL LOW (ref 35.0–47.0)
Hemoglobin: 11.4 g/dL — ABNORMAL LOW (ref 12.0–16.0)
MCH: 29.8 pg (ref 26.0–34.0)
MCHC: 33.3 g/dL (ref 32.0–36.0)
MCV: 89.4 fL (ref 80.0–100.0)
Platelets: 226 10*3/uL (ref 150–440)
RBC: 3.82 MIL/uL (ref 3.80–5.20)
RDW: 13.9 % (ref 11.5–14.5)
WBC: 6.8 10*3/uL (ref 3.6–11.0)

## 2017-11-06 LAB — ABO/RH: ABO/RH(D): B POS

## 2017-11-06 LAB — HEMOGLOBIN AND HEMATOCRIT, BLOOD
HCT: 33.1 % — ABNORMAL LOW (ref 35.0–47.0)
HCT: 31.2 % — ABNORMAL LOW (ref 35.0–47.0)
Hemoglobin: 11 g/dL — ABNORMAL LOW (ref 12.0–16.0)
Hemoglobin: 10.3 g/dL — ABNORMAL LOW (ref 12.0–16.0)

## 2017-11-06 LAB — APTT: aPTT: 32 seconds (ref 24–36)

## 2017-11-06 LAB — PROTIME-INR
INR: 0.92
Prothrombin Time: 12.3 seconds (ref 11.4–15.2)

## 2017-11-06 MED ORDER — SODIUM CHLORIDE 0.9 % IV SOLN
8.0000 mg/h | INTRAVENOUS | Status: DC
  Administered 2017-11-06 – 2017-11-07 (×2): 8 mg/h via INTRAVENOUS
  Filled 2017-11-06 (×2): qty 80

## 2017-11-06 MED ORDER — ONDANSETRON HCL 4 MG/2ML IJ SOLN
4.0000 mg | Freq: Four times a day (QID) | INTRAMUSCULAR | Status: DC | PRN
  Filled 2017-11-06: qty 2

## 2017-11-06 MED ORDER — MORPHINE SULFATE (PF) 2 MG/ML IV SOLN: 2.0000 mg | INTRAVENOUS | Status: DC | PRN

## 2017-11-06 MED ORDER — ONDANSETRON HCL 4 MG PO TABS: 4.0000 mg | ORAL_TABLET | Freq: Four times a day (QID) | ORAL | Status: DC | PRN

## 2017-11-06 MED ORDER — DEXTROSE-NACL 5-0.9 % IV SOLN
INTRAVENOUS | Status: DC
  Administered 2017-11-06 – 2017-11-08 (×3): via INTRAVENOUS

## 2017-11-06 MED ORDER — ACETAMINOPHEN 325 MG PO TABS: 650.0000 mg | ORAL_TABLET | Freq: Four times a day (QID) | ORAL | Status: DC | PRN

## 2017-11-06 MED ORDER — IOPAMIDOL (ISOVUE-300) INJECTION 61%
30.0000 mL | Freq: Once | INTRAVENOUS | Status: AC | PRN
  Administered 2017-11-06: 30 mL via ORAL

## 2017-11-06 MED ORDER — IOPAMIDOL (ISOVUE-300) INJECTION 61%
100.0000 mL | Freq: Once | INTRAVENOUS | Status: AC | PRN
  Administered 2017-11-06: 100 mL via INTRAVENOUS

## 2017-11-06 MED ORDER — ACETAMINOPHEN 650 MG RE SUPP: 650.0000 mg | Freq: Four times a day (QID) | RECTAL | Status: DC | PRN

## 2017-11-06 MED ORDER — SODIUM CHLORIDE 0.9 % IV BOLUS (SEPSIS)
1000.0000 mL | Freq: Once | INTRAVENOUS | Status: AC
  Administered 2017-11-06: 1000 mL via INTRAVENOUS

## 2017-11-06 MED ORDER — FLUTICASONE PROPIONATE 50 MCG/ACT NA SUSP
2.0000 | Freq: Every day | NASAL | Status: DC | PRN
  Filled 2017-11-06: qty 16

## 2017-11-06 MED ORDER — FENTANYL CITRATE (PF) 100 MCG/2ML IJ SOLN
25.0000 ug | Freq: Once | INTRAMUSCULAR | Status: AC
  Administered 2017-11-06: 25 ug via INTRAVENOUS
  Filled 2017-11-06: qty 2

## 2017-11-06 NOTE — ED Triage Notes (Signed)
Pt c/o red blood in stools since yesterday evening. States today having abd pain with loose stool this morning.states she has had 5 stools today.

## 2017-11-06 NOTE — Plan of Care (Signed)
  Progressing Education: Knowledge of General Education information will improve 11/06/2017 1843 - Progressing by Milderd Meager, RN Health Behavior/Discharge Planning: Ability to manage health-related needs will improve 11/06/2017 1843 - Progressing by Milderd Meager, RN Clinical Measurements: Ability to maintain clinical measurements within normal limits will improve 11/06/2017 1843 - Progressing by Milderd Meager, RN Will remain free from infection 11/06/2017 1843 - Progressing by Milderd Meager, RN Diagnostic test results will improve 11/06/2017 1843 - Progressing by Milderd Meager, RN Respiratory complications will improve 11/06/2017 1843 - Progressing by Milderd Meager, RN Cardiovascular complication will be avoided 11/06/2017 1843 - Progressing by Milderd Meager, RN Activity: Risk for activity intolerance will decrease 11/06/2017 1843 - Progressing by Milderd Meager, RN Nutrition: Adequate nutrition will be maintained 11/06/2017 1843 - Progressing by Milderd Meager, RN Coping: Level of anxiety will decrease 11/06/2017 1843 - Progressing by Milderd Meager, RN Elimination: Will not experience complications related to bowel motility 11/06/2017 1843 - Progressing by Milderd Meager, RN Will not experience complications related to urinary retention 11/06/2017 1843 - Progressing by Milderd Meager, RN Pain Managment: General experience of comfort will improve 11/06/2017 1843 - Progressing by Milderd Meager, RN Safety: Ability to remain free from injury will improve 11/06/2017 1843 - Progressing by Milderd Meager, RN Skin Integrity: Risk for impaired skin integrity will decrease 11/06/2017 1843 - Progressing by Milderd Meager, RN Education: Ability to identify signs and symptoms of gastrointestinal bleeding will improve 11/06/2017 1843 - Progressing by Milderd Meager, RN Bowel/Gastric: Will show no signs and  symptoms of gastrointestinal bleeding 11/06/2017 1843 - Progressing by Milderd Meager, RN Fluid Volume: Will show no signs and symptoms of excessive bleeding 11/06/2017 1843 - Progressing by Milderd Meager, RN Clinical Measurements: Complications related to the disease process, condition or treatment will be avoided or minimized 11/06/2017 1843 - Progressing by Milderd Meager, RN

## 2017-11-06 NOTE — H&P (Signed)
[facility] [facility] [place] NAME: Holly Green    MR#:  [id number]  DATE OF BIRTH:  08/26/1946  DATE OF ADMISSION:  11/06/2017  PRIMARY CARE PHYSICIAN: Arnetha Courser, MD   REQUESTING/REFERRING PHYSICIAN:   CHIEF COMPLAINT:   Chief Complaint  Patient presents with  . Rectal Bleeding    HISTORY OF PRESENT ILLNESS: Holly Green  is a 71 y.o. female with a known history of diverticular disease, GERD, hyperlipidemia, hypertension, nephrolithiasis, abdominal aortic atherosclerosis presented to the emergency room with rectal bleed since this morning.  Patient had 4 episodes of bright red blood per rectum.  Has abdominal discomfort which is aching in nature 3 out of 10 on a scale of 1-10.  Nausea and vomiting.  No complaints of any hematemesis, hemoptysis.  Patient was evaluated in the emergency room with CT abdomen which shows sigmoid diverticulosis but no inflammation.  Hospitalist service was consulted to further care.  PAST MEDICAL HISTORY:   Past Medical History:  Diagnosis Date  . Abdominal aortic atherosclerosis ([place]) 03/05/2017   CT scan April 2018  . Arthritis   . Bleeding disorder ([place])   . Diverticulitis 2015  . GERD (gastroesophageal reflux disease)   . History of kidney stones   . History of stomach ulcers   . Hyperlipidemia   . Hypertension   . Osteoporosis   . Pre-diabetes   . Prediabetes 10/15/2015  . Spondylolisthesis at L4-L5 level 03/07/2017    PAST SURGICAL HISTORY:  Past Surgical History:  Procedure Laterality Date  . ABDOMINAL HYSTERECTOMY    . BACK SURGERY  2010  . BREAST CYST ASPIRATION Right 1990's  . CYSTO WITH HYDRODISTENSION N/A 08/15/2017   Procedure: CYSTOSCOPY/HYDRODISTENSION;  Surgeon: Nickie Retort, MD;  Location: [facility] ORS;  Service: Urology;  Laterality: N/A;  . FOOT SURGERY Right     SOCIAL HISTORY:  Social History   Tobacco Use  . Smoking status: Former Research scientist (life sciences)  . Smokeless tobacco:  Never Used  . Tobacco comment: teenage years about 1 year  Substance Use Topics  . Alcohol use: No    Alcohol/week: 0.0 oz    FAMILY HISTORY:  Family History  Problem Relation Age of Onset  . Hypertension Brother   . Heart disease Brother   . Heart failure Brother   . Hypertension Mother   . Stroke Mother   . Stroke Father   . Prostate cancer Father   . Aneurysm Sister   . Breast cancer Sister 78  . Liver disease Maternal Aunt   . Heart failure Paternal Aunt   . Heart disease Paternal Uncle   . Heart attack Brother   . Heart disease Brother   . Breast cancer Maternal Aunt   . Heart disease Paternal Aunt   . Kidney cancer Neg Hx   . Bladder Cancer Neg Hx     DRUG ALLERGIES:  Allergies  Allergen Reactions  . Aspirin Other (See Comments)    Diverticulosis    REVIEW OF SYSTEMS:   CONSTITUTIONAL: No fever, fatigue or weakness.  EYES: No blurred or double vision.  EARS, NOSE, AND THROAT: No tinnitus or ear pain.  RESPIRATORY: No cough, shortness of breath, wheezing or hemoptysis.  CARDIOVASCULAR: No chest pain, orthopnea, edema.  GASTROINTESTINAL: No nausea, vomiting, diarrhea  has abdominal pain.  Has rectal bleed. GENITOURINARY: No dysuria, hematuria.  ENDOCRINE: No polyuria, nocturia,  HEMATOLOGY: No anemia, easy bruising or bleeding SKIN: No rash or lesion. MUSCULOSKELETAL: No joint  pain or arthritis.   NEUROLOGIC: No tingling, numbness, weakness.  PSYCHIATRY: No anxiety or depression.   MEDICATIONS AT HOME:  Prior to Admission medications   Medication Sig Start Date End Date Taking? Authorizing Provider  bisoprolol-hydrochlorothiazide (ZIAC) 10-6.25 MG tablet Take 1 tablet by mouth daily. 10/31/17  Yes Lada, Satira Anis, MD  Calcium Carb-Cholecalciferol (CALCIUM-VITAMIN D) 600-400 MG-UNIT TABS Take 1 tablet by mouth daily.   Yes [provider]  lisinopril (PRINIVIL,ZESTRIL) 5 MG tablet Take 1 tablet (5 mg total) by mouth daily. For BP and kidneys  07/08/17  Yes Lada, Satira Anis, MD  lovastatin (MEVACOR) 20 MG tablet Take 1 tablet (20 mg total) by mouth at bedtime. 12/20/16  Yes Lada, Satira Anis, MD  fluticasone (FLONASE) 50 MCG/ACT nasal spray Place 2 sprays into both nostrils daily. 08/28/17   Hubbard Hartshorn, FNP  HYDROcodone-acetaminophen (NORCO) 5-325 MG tablet Take 1 tablet by mouth every 4 (four) hours as needed for moderate pain. Patient not taking: Reported on 11/06/2017 08/15/17   Nickie Retort, MD  levocetirizine (XYZAL) 5 MG tablet Take 1 tablet (5 mg total) by mouth every evening. 08/28/17   Hubbard Hartshorn, FNP  ranitidine (ZANTAC) 150 MG tablet Take 1 tablet (150 mg total) by mouth 2 (two) times daily. If needed for heartburn, acid reflux Patient not taking: Reported on 11/06/2017 02/20/17   Arnetha Courser, MD      PHYSICAL EXAMINATION:   VITAL SIGNS: Blood pressure 121/81, pulse 68, temperature 98.1 F (36.7 C), temperature source Oral, resp. rate 12, height 5\' 6"  (1.676 m), weight 81.6 kg (180 lb), SpO2 99 %.  GENERAL:  71 y.o.-year-old patient lying in the bed with no acute distress.  EYES: Pupils equal, round, reactive to light and accommodation. No scleral icterus. Extraocular muscles intact.  HEENT: Head atraumatic, normocephalic. Oropharynx dry and nasopharynx clear.  NECK:  Supple, no jugular venous distention. No thyroid enlargement, no tenderness.  LUNGS: Normal breath sounds bilaterally, no wheezing, rales,rhonchi or crepitation. No use of accessory muscles of respiration.  CARDIOVASCULAR: S1, S2 normal. No murmurs, rubs, or gallops.  ABDOMEN: Soft, nontender, nondistended. Bowel sounds present. No organomegaly or mass.  EXTREMITIES: No pedal edema, cyanosis, or clubbing.  NEUROLOGIC: Cranial nerves II through XII are intact. Muscle strength 5/5 in all extremities. Sensation intact. Gait not checked.  PSYCHIATRIC: The patient is alert and oriented x 3.  SKIN: No obvious rash, lesion, or ulcer.   LABORATORY  PANEL:   CBC Recent Labs  Lab 11/06/17 0816  WBC 6.8  HGB 11.4*  HCT 34.1*  PLT 226  MCV 89.4  MCH 29.8  MCHC 33.3  RDW 13.9   ------------------------------------------------------------------------------------------------------------------  Chemistries  Recent Labs  Lab 11/06/17 0816  NA 136  K 3.7  CL 103  CO2 28  GLUCOSE 108*  BUN 31*  CREATININE 0.84  CALCIUM 9.3  AST 25  ALT 17  ALKPHOS 87  BILITOT 1.2   ------------------------------------------------------------------------------------------------------------------ estimated creatinine clearance is 67.1 mL/min (by C-G formula based on SCr of 0.84 mg/dL). ------------------------------------------------------------------------------------------------------------------ No results for input(s): TSH, T4TOTAL, T3FREE, THYROIDAB in the last 72 hours.  Invalid input(s): FREET3   Coagulation profile Recent Labs  Lab 11/06/17 1109  INR 0.92   ------------------------------------------------------------------------------------------------------------------- No results for input(s): DDIMER in the last 72 hours. -------------------------------------------------------------------------------------------------------------------  Cardiac Enzymes No results for input(s): CKMB, TROPONINI, MYOGLOBIN in the last 168 hours.  Invalid input(s): CK ------------------------------------------------------------------------------------------------------------------ Invalid input(s): POCBNP  ---------------------------------------------------------------------------------------------------------------  Urinalysis    Component Value  Date/Time   COLORURINE STRAW (A) 08/08/2015 1213   APPEARANCEUR Clear 09/03/2017 1128   LABSPEC 1.005 08/08/2015 1213   LABSPEC 1.020 01/11/2014 1139   PHURINE 8.0 08/08/2015 1213   GLUCOSEU Negative 09/03/2017 1128   GLUCOSEU 150 mg/dL 01/11/2014 1139   HGBUR NEGATIVE 08/08/2015 1213    BILIRUBINUR Negative 09/03/2017 1128   BILIRUBINUR Negative 01/11/2014 1139   KETONESUR NEGATIVE 08/08/2015 1213   PROTEINUR Negative 09/03/2017 1128   PROTEINUR NEGATIVE 08/08/2015 1213   UROBILINOGEN negative 02/20/2017 1201   UROBILINOGEN 0.2 09/27/2009 0843   NITRITE Negative 09/03/2017 1128   NITRITE NEGATIVE 08/08/2015 1213   LEUKOCYTESUR Negative 09/03/2017 1128   LEUKOCYTESUR Negative 01/11/2014 1139     RADIOLOGY: Ct Abdomen Pelvis W Contrast  Result Date: 11/06/2017 CLINICAL DATA:  Generalized abdominal pain, bloody stool. EXAM: CT ABDOMEN AND PELVIS WITH CONTRAST TECHNIQUE: Multidetector CT imaging of the abdomen and pelvis was performed using the standard protocol following bolus administration of intravenous contrast. CONTRAST:  141mL ISOVUE-300 IOPAMIDOL (ISOVUE-300) INJECTION 61% intravenously, 94mL ISOVUE-300 IOPAMIDOL (ISOVUE-300) INJECTION 61% orally. COMPARISON:  CT scan of May 07, 2017. FINDINGS: Lower chest: No acute abnormality. Hepatobiliary: No gallstones are noted. Stable left hepatic cyst. No other abnormality seen in the liver. Pancreas: Unremarkable. No pancreatic ductal dilatation or surrounding inflammatory changes. Spleen: Normal in size without focal abnormality. Adrenals/Urinary Tract: Adrenal glands appear normal. Stable left renal cyst. No hydronephrosis or renal obstruction is noted. No renal or ureteral calculi are noted. Urinary bladder is unremarkable. Stomach/Bowel: Stomach is within normal limits. Appendix appears normal. No evidence of bowel wall thickening, distention, or inflammatory changes. Sigmoid diverticulosis is noted without inflammation. Vascular/Lymphatic: Aortic atherosclerosis. No enlarged abdominal or pelvic lymph nodes. Reproductive: Uterus and bilateral adnexa are unremarkable. Other: No abdominal wall hernia or abnormality. No abdominopelvic ascites. 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 Degenerative disc disease is  noted in the lower lumbar spine. No acute osseous abnormality is noted. IMPRESSION: Sigmoid diverticulosis without inflammation. Aortic atherosclerosis. Bilateral L5 pars defects. No acute abnormality seen in the abdomen or pelvis. Electronically Signed   By: Marijo Conception, M.D.   On: 11/06/2017 13:26    EKG: Orders placed or performed during the hospital encounter of 11/06/17  . ED EKG  . ED EKG    IMPRESSION AND PLAN: 71 year old female patient with history of diverticular disease,, hypertension, hyperlipidemia, osteoporosis, GERD, nephrolithiasis presented to the emergency room with abdominal discomfort, rectal bleed.    Admitting diagnosis  1.  Acute gastrointestinal bleeding 2.  Abdominal pain 3.  Hypertension 4.  Hyperlipidemia 5.  Diverticular disease Treatment plan Admit patient to medical floor inpatient service IV fluid hydration N.p.o. IV Protonix drip Gastroenterology consultation for colonoscopy Pain management with IV morphine as needed Serial hemoglobin hematocrit monitoring   All the records are reviewed and case discussed with ED provider. Management plans discussed with the patient, family and they are in agreement.  CODE STATUS:FULL CODE Code Status History    This patient does not have a recorded code status. Please follow your organizational policy for patients in this situation.       TOTAL TIME TAKING CARE OF THIS PATIENT: 50 minutes.    Saundra Shelling M.D on 11/06/2017 at 2:35 PM  Between 7am to 6pm - Pager - [phone]  After 6pm go to [email or web address] - password [email or web address] [facility]  Office  [phone]  CC: Primary care physician; Arnetha Courser, MD

## 2017-11-06 NOTE — ED Notes (Signed)
Type and Screen redrawn and sent to lab

## 2017-11-06 NOTE — ED Provider Notes (Signed)
[facility] Emergency Department Provider Note  ____________________________________________  Time seen: Approximately 9:36 AM  I have reviewed the triage vital signs and the nursing notes.   HISTORY  Chief Complaint Rectal Bleeding    HPI Holly Green is a 71 y.o. female with a history of recurrent diverticulitis associated with GI bleed, not anticoagulated, presenting with bright red blood per rectum.  The patient reports that yesterday she developed some mild left lower quadrant discomfort without any associated nausea or vomiting, fever or chills.  She had some mild bright red blood on the toilet paper when she wiped.  Today, the patient has had 5 episodes of loose stool with a significant amount of blood, turning the entire toilet bowl red.  She has not had any lightheadedness or syncope, chest pain or shortness of breath, fever or chills.  No pain with defecation.  Past Medical History:  Diagnosis Date  . Abdominal aortic atherosclerosis ([place]) 03/05/2017   CT scan April 2018  . Arthritis   . Bleeding disorder ([place])   . Diverticulitis 2015  . GERD (gastroesophageal reflux disease)   . History of kidney stones   . History of stomach ulcers   . Hyperlipidemia   . Hypertension   . Osteoporosis   . Pre-diabetes   . Prediabetes 10/15/2015  . Spondylolisthesis at L4-L5 level 03/07/2017    Patient Active Problem List   Diagnosis Date Noted  . Left flank pain, chronic 03/07/2017  . Spondylolisthesis at L4-L5 level 03/07/2017  . Abdominal aortic atherosclerosis ([place]) 03/05/2017  . Simple cyst of kidney 03/05/2017  . Benign liver cyst 03/05/2017  . Medication monitoring encounter 11/22/2016  . GERD (gastroesophageal reflux disease) 08/07/2016  . Osteoporosis 05/07/2016  . Cardiac murmur 05/07/2016  . Prediabetes 10/15/2015  . Hyperlipidemia 10/15/2015  . Essential hypertension 10/15/2015  . Diverticulosis of colon, acquired 10/15/2015    Past  Surgical History:  Procedure Laterality Date  . ABDOMINAL HYSTERECTOMY    . BACK SURGERY  2010  . BREAST CYST ASPIRATION Right 1990's  . CYSTO WITH HYDRODISTENSION N/A 08/15/2017   Procedure: CYSTOSCOPY/HYDRODISTENSION;  Surgeon: Nickie Retort, MD;  Location: [facility] ORS;  Service: Urology;  Laterality: N/A;  . FOOT SURGERY Right     Current Outpatient Rx  . Order #: [id number] Class: Normal  . Order #: [id number] Class: Historical Med  . Order #: [id number] Class: Historical Med  . Order #: [id number] Class: Historical Med  . Order #: [id number] Class: Normal  . Order #: [id number] Class: Print  . Order #: [id number] Class: Normal  . Order #: [id number] Class: Normal  . Order #: [id number] Class: Normal  . Order #: [id number] Class: Normal    Allergies Aspirin  Family History  Problem Relation Age of Onset  . Hypertension Brother   . Heart disease Brother   . Heart failure Brother   . Hypertension Mother   . Stroke Mother   . Stroke Father   . Prostate cancer Father   . Aneurysm Sister   . Breast cancer Sister 44  . Liver disease Maternal Aunt   . Heart failure Paternal Aunt   . Heart disease Paternal Uncle   . Heart attack Brother   . Heart disease Brother   . Breast cancer Maternal Aunt   . Heart disease Paternal Aunt   . Kidney cancer Neg Hx   . Bladder Cancer Neg Hx     Social History Social History   Tobacco Use  . Smoking status: Former Research scientist (life sciences)  . Smokeless tobacco: Never Used  .  Tobacco comment: teenage years about 1 year  Substance Use Topics  . Alcohol use: No    Alcohol/week: 0.0 oz  . Drug use: No    Review of Systems Constitutional: No fever/chills.  No lightheadedness or syncope.  Eyes: No visual changes. ENT: No sore throat. No congestion or rhinorrhea. Cardiovascular: Denies chest pain. Denies palpitations. Respiratory: Denies shortness of breath.  No cough. Gastrointestinal: Positive left lower quadrant abdominal pain.  No nausea, no vomiting.  Positive  loose stool with blood.  .  No constipation. Genitourinary: Negative for dysuria. Musculoskeletal: Negative for back pain. Skin: Negative for rash. Neurological: Negative for headaches. No focal numbness, tingling or weakness.     ____________________________________________   PHYSICAL EXAM:  VITAL SIGNS: ED Triage Vitals  Enc Vitals Group     BP 11/06/17 0817 (!) 145/102     Pulse Rate 11/06/17 0817 77     Resp 11/06/17 0817 18     Temp 11/06/17 0815 98.1 F (36.7 C)     Temp Source 11/06/17 0815 Oral     SpO2 11/06/17 0817 99 %     Weight 11/06/17 0817 180 lb (81.6 kg)     Height 11/06/17 0817 5\' 6"  (1.676 m)     Head Circumference --      Peak Flow --      Pain Score 11/06/17 0817 2     Pain Loc --      Pain Edu? --      Excl. in [place]? --     Constitutional: Alert and oriented. Well appearing and in no acute distress. Answers questions appropriately. Eyes: Conjunctivae are normal.  EOMI. No scleral icterus. Head: Atraumatic. Nose: No congestion/rhinnorhea. Mouth/Throat: Mucous membranes are moist.  Neck: No stridor.  Supple.  No JVD.  No dizziness Cardiovascular: Normal rate, regular rhythm. No murmurs, rubs or gallops.  Respiratory: Normal respiratory effort.  No accessory muscle use or retractions. Lungs CTAB.  No wheezes, rales or ronchi. Gastrointestinal: Soft, and nondistended.  No tenderness to palpation in the left lower quadrant.  No guarding or rebound.  No peritoneal signs. Genitourinary: No visible external or palpable internal hemorrhoids.  The patient has bright red blood per rectum that is guaiac positive.  No pain with rectal examination. Musculoskeletal: No LE edema.  Neurologic:  A&Ox3.  Speech is clear.  Face and smile are symmetric.  EOMI.  Moves all extremities well. Skin:  Skin is warm, dry and intact. No rash noted. Psychiatric: Mood and affect are normal. Speech and behavior are normal.  Normal  judgement.  ____________________________________________   LABS (all labs ordered are listed, but only abnormal results are displayed)  Labs Reviewed  COMPREHENSIVE METABOLIC PANEL - Abnormal; Notable for the following components:      Result Value   Glucose, Bld 108 (*)    BUN 31 (*)    All other components within normal limits  CBC - Abnormal; Notable for the following components:   Hemoglobin 11.4 (*)    HCT 34.1 (*)    All other components within normal limits  PROTIME-INR  APTT  POC OCCULT BLOOD, ED  TYPE AND SCREEN  ABO/RH   ____________________________________________  EKG  ED ECG REPORT I, Eula Listen, the attending physician, personally viewed and interpreted this ECG.   Date: 11/06/2017  EKG Time: 1351  Rate: 69  Rhythm: normal sinus rhythm  Axis: leftward  Intervals:none  ST&T Change: No STEMI  ____________________________________________  RADIOLOGY  Ct Abdomen Pelvis W Contrast  Result Date: 11/06/2017 CLINICAL DATA:  Generalized abdominal pain, bloody stool. EXAM: CT ABDOMEN AND PELVIS WITH CONTRAST TECHNIQUE: Multidetector CT imaging of the abdomen and pelvis was performed using the standard protocol following bolus administration of intravenous contrast. CONTRAST:  141mL ISOVUE-300 IOPAMIDOL (ISOVUE-300) INJECTION 61% intravenously, 89mL ISOVUE-300 IOPAMIDOL (ISOVUE-300) INJECTION 61% orally. COMPARISON:  CT scan of May 07, 2017. FINDINGS: Lower chest: No acute abnormality. Hepatobiliary: No gallstones are noted. Stable left hepatic cyst. No other abnormality seen in the liver. Pancreas: Unremarkable. No pancreatic ductal dilatation or surrounding inflammatory changes. Spleen: Normal in size without focal abnormality. Adrenals/Urinary Tract: Adrenal glands appear normal. Stable left renal cyst. No hydronephrosis or renal obstruction is noted. No renal or ureteral calculi are noted. Urinary bladder is unremarkable. Stomach/Bowel: Stomach is within  normal limits. Appendix appears normal. No evidence of bowel wall thickening, distention, or inflammatory changes. Sigmoid diverticulosis is noted without inflammation. Vascular/Lymphatic: Aortic atherosclerosis. No enlarged abdominal or pelvic lymph nodes. Reproductive: Uterus and bilateral adnexa are unremarkable. Other: No abdominal wall hernia or abnormality. No abdominopelvic ascites. 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 No acute osseous abnormality is noted. IMPRESSION: Sigmoid diverticulosis without inflammation. Aortic atherosclerosis. Bilateral L5 pars defects. No acute abnormality seen in the abdomen or pelvis. Electronically Signed   By: Marijo Conception, M.D.   On: 11/06/2017 13:26    ____________________________________________   PROCEDURES  Procedure(s) performed: None  Procedures  Critical Care performed: No ____________________________________________   INITIAL IMPRESSION / ASSESSMENT AND PLAN / ED COURSE  Pertinent labs & imaging results that were available during my care of the patient were reviewed by me and considered in my medical decision making (see chart for details).  71 y.o. female, not anticoagulated, presenting with bleeding and left lower quadrant pain.  Overall, the patient's clinical picture is most consistent with diverticulitis completed by lower GI bleed.  The patient has a recurrence of this in the past.  Internal hemorrhoid is also possible.  Upper GI bleed is much less likely.  At this time, I will plan to treat the patient symptomatically and obtain a CT abdomen.  She has type and screen that is active but acute blood transfusion is not indicated given that she is hemodynamically stable with a hemoglobin of 11.4.  Plan admission for further evaluation and treatment.  ----------------------------------------- 1:41 PM on  11/06/2017 -----------------------------------------  The patient CT scan shows diverticulosis without diverticulitis.  She continues to be hemodynamically stable and will be admitted at this time.  ____________________________________________  FINAL CLINICAL IMPRESSION(S) / ED DIAGNOSES  Final diagnoses:  Gastrointestinal hemorrhage, unspecified gastrointestinal hemorrhage type  Left lower quadrant pain         NEW MEDICATIONS STARTED DURING THIS VISIT:  This SmartLink is deprecated. Use AVSMEDLIST instead to display the medication list for a patient.    Eula Listen, MD 11/06/17 1356

## 2017-11-07 DIAGNOSIS — K921 Melena: Secondary | ICD-10-CM

## 2017-11-07 LAB — BASIC METABOLIC PANEL
Anion gap: 5 (ref 5–15)
BUN: 13 mg/dL (ref 6–20)
CO2: 27 mmol/L (ref 22–32)
Calcium: 8.1 mg/dL — ABNORMAL LOW (ref 8.9–10.3)
Chloride: 106 mmol/L (ref 101–111)
Creatinine, Ser: 0.61 mg/dL (ref 0.44–1.00)
GFR calc Af Amer: >60 mL/min (ref >60)
GFR calc non Af Amer: >60 mL/min (ref >60)
Glucose, Bld: 103 mg/dL — ABNORMAL HIGH (ref 65–99)
Potassium: 3.4 mmol/L — ABNORMAL LOW (ref 3.5–5.1)
Sodium: 138 mmol/L (ref 135–145)

## 2017-11-07 LAB — HEMOGLOBIN AND HEMATOCRIT, BLOOD
HCT: 28.9 % — ABNORMAL LOW (ref 35.0–47.0)
HCT: 31.1 % — ABNORMAL LOW (ref 35.0–47.0)
Hemoglobin: 9.5 g/dL — ABNORMAL LOW (ref 12.0–16.0)
Hemoglobin: 10.4 g/dL — ABNORMAL LOW (ref 12.0–16.0)

## 2017-11-07 MED ORDER — PEG 3350-KCL-NA BICARB-NACL 420 G PO SOLR
4000.0000 mL | Freq: Once | ORAL | Status: AC
  Administered 2017-11-07: 4000 mL via ORAL
  Filled 2017-11-07: qty 4000

## 2017-11-07 MED ORDER — GUAIFENESIN-DM 100-10 MG/5ML PO SYRP
5.0000 mL | ORAL_SOLUTION | ORAL | Status: DC | PRN
  Filled 2017-11-07: qty 5

## 2017-11-07 MED ORDER — PANTOPRAZOLE SODIUM 40 MG PO TBEC
40.0000 mg | DELAYED_RELEASE_TABLET | Freq: Every day | ORAL | Status: DC
  Administered 2017-11-07 – 2017-11-08 (×2): 40 mg via ORAL
  Filled 2017-11-07 (×2): qty 1

## 2017-11-07 MED ORDER — ACETAMINOPHEN 650 MG RE SUPP: 650.0000 mg | Freq: Four times a day (QID) | RECTAL | Status: DC | PRN

## 2017-11-07 MED ORDER — CALCIUM CARBONATE-VITAMIN D 500-200 MG-UNIT PO TABS
1.0000 | ORAL_TABLET | Freq: Every day | ORAL | Status: DC
  Administered 2017-11-08: 09:00:00 1 via ORAL
  Filled 2017-11-07: qty 1

## 2017-11-07 MED ORDER — BISOPROLOL-HYDROCHLOROTHIAZIDE 10-6.25 MG PO TABS
1.0000 | ORAL_TABLET | Freq: Every day | ORAL | Status: DC
  Administered 2017-11-07 – 2017-11-08 (×2): 1 via ORAL
  Filled 2017-11-07 (×3): qty 1

## 2017-11-07 MED ORDER — PANTOPRAZOLE SODIUM 40 MG IV SOLR: 40.0000 mg | Freq: Two times a day (BID) | INTRAVENOUS | Status: DC

## 2017-11-07 MED ORDER — PRAVASTATIN SODIUM 20 MG PO TABS
10.0000 mg | ORAL_TABLET | Freq: Every day | ORAL | Status: DC
  Administered 2017-11-07 – 2017-11-08 (×2): 10 mg via ORAL
  Filled 2017-11-07 (×2): qty 1

## 2017-11-07 MED ORDER — LISINOPRIL 5 MG PO TABS
5.0000 mg | ORAL_TABLET | Freq: Every day | ORAL | Status: DC
  Administered 2017-11-07 – 2017-11-08 (×2): 5 mg via ORAL
  Filled 2017-11-07 (×2): qty 1

## 2017-11-07 MED ORDER — CALCIUM-VITAMIN D 600-400 MG-UNIT PO TABS: 1.0000 | ORAL_TABLET | Freq: Every day | ORAL | Status: DC

## 2017-11-07 MED ORDER — ACETAMINOPHEN 325 MG PO TABS
650.0000 mg | ORAL_TABLET | Freq: Four times a day (QID) | ORAL | Status: DC | PRN
  Administered 2017-11-07: 650 mg via ORAL
  Filled 2017-11-07: qty 2

## 2017-11-07 NOTE — Progress Notes (Signed)
Pt had bm this am with large amount of bright red blood. Pt is asymptomatic. Vss. No pain. Will cont to monitor.

## 2017-11-07 NOTE — Progress Notes (Signed)
Patient noted with with non-productive cough and stated, " I have been coughing for months now".  Guaifinacin standing orders carried out for when patient is no long NPO. Will continue to monitor and endorse.

## 2017-11-07 NOTE — Consult Note (Signed)
Holly Antigua, MD [address] [place], [place], [place], [address] [address], [place], [place], [place], [address] Phone: [phone]  Fax: [phone]  Consultation  Referring Provider:     Dr. Posey Pronto Primary Care Physician:  Arnetha Courser, MD Primary Gastroenterologist:  Virgel Manifold, MD        Reason for Consultation:     Bright red blood per rectum bright red blood per rectum  Date of Admission:  11/06/2017 Date of Consultation:  11/07/2017         HPI:   Holly Green is a 71 y.o. female 1 day history of 3-4 bowel movements of bright red blood per rectum.  No abdominal pain, no emesis.  No blood thinners at home.  Patient has history of diverticular bleed, and embolization for the same.   Patient was last seen in 2015 for similar symptoms and colonoscopy was planned at the time but due to significant bleeding RBC scan was done that showed positive hemorrhage in the right colon likely related to bleeding diverticulum and patient underwent coil embolization episode prior to this was in 2009 with positive RBC scan in the mid transverse and descending colon consistent with GI bleed at the time.  Patient had an upper endoscopy in 2012 which showed hiatal hernia, erosive gastropathy, colonoscopy was done for abdominal pain which showed solitary ulcer in the descending colon.  Patient has not had a drop in her blood count below baseline and feels well otherwise.  Had a bowel movement this morning that was bloody.  Past Medical History:  Diagnosis Date  . Abdominal aortic atherosclerosis ([place]) 03/05/2017   CT scan April 2018  . Arthritis   . Bleeding disorder ([place])   . Diverticulitis 2015  . GERD (gastroesophageal reflux disease)   . History of kidney stones   . History of stomach ulcers   . Hyperlipidemia   . Hypertension   . Osteoporosis   . Pre-diabetes   . Prediabetes 10/15/2015  . Spondylolisthesis at L4-L5 level 03/07/2017    Past Surgical History:    Procedure Laterality Date  . ABDOMINAL HYSTERECTOMY    . BACK SURGERY  2010  . BREAST CYST ASPIRATION Right 1990's  . CYSTO WITH HYDRODISTENSION N/A 08/15/2017   Procedure: CYSTOSCOPY/HYDRODISTENSION;  Surgeon: Nickie Retort, MD;  Location: [facility] ORS;  Service: Urology;  Laterality: N/A;  . FOOT SURGERY Right     Prior to Admission medications   Medication Sig Start Date End Date Taking? Authorizing Provider  bisoprolol-hydrochlorothiazide (ZIAC) 10-6.25 MG tablet Take 1 tablet by mouth daily. 10/31/17  Yes Lada, Satira Anis, MD  Calcium Carb-Cholecalciferol (CALCIUM-VITAMIN D) 600-400 MG-UNIT TABS Take 1 tablet by mouth daily.   Yes [provider]  lisinopril (PRINIVIL,ZESTRIL) 5 MG tablet Take 1 tablet (5 mg total) by mouth daily. For BP and kidneys 07/08/17  Yes Lada, Satira Anis, MD  lovastatin (MEVACOR) 20 MG tablet Take 1 tablet (20 mg total) by mouth at bedtime. 12/20/16  Yes Lada, Satira Anis, MD  fluticasone (FLONASE) 50 MCG/ACT nasal spray Place 2 sprays into both nostrils daily. 08/28/17   Hubbard Hartshorn, FNP  levocetirizine (XYZAL) 5 MG tablet Take 1 tablet (5 mg total) by mouth every evening. 08/28/17   Hubbard Hartshorn, FNP    Family History  Problem Relation Age of Onset  . Hypertension Brother   . Heart disease Brother   . Heart failure Brother   . Hypertension Mother   . Stroke  Mother   . Stroke Father   . Prostate cancer Father   . Aneurysm Sister   . Breast cancer Sister 45  . Liver disease Maternal Aunt   . Heart failure Paternal Aunt   . Heart disease Paternal Uncle   . Heart attack Brother   . Heart disease Brother   . Breast cancer Maternal Aunt   . Heart disease Paternal Aunt   . Kidney cancer Neg Hx   . Bladder Cancer Neg Hx      Social History   Tobacco Use  . Smoking status: Former Research scientist (life sciences)  . Smokeless tobacco: Never Used  . Tobacco comment: teenage years about 1 year  Substance Use Topics  . Alcohol use: No    Alcohol/week: 0.0 oz  .  Drug use: No    Allergies as of 11/06/2017 - Review Complete 11/06/2017  Allergen Reaction Noted  . Aspirin Other (See Comments) 08/08/2017    Review of Systems:    All systems reviewed and negative except where noted in HPI.   Physical Exam:  Vital signs in last 24 hours: Vitals:   11/06/17 1953 11/07/17 0340 11/07/17 0909 11/07/17 1530  BP: 139/70 136/62 127/63 (!) 148/79  Pulse: 64 73 67 78  Resp: 19 18  18   Temp: 98.4 F (36.9 C) 98.8 F (37.1 C) 98.6 F (37 C) 98.8 F (37.1 C)  TempSrc: Oral Oral Oral Oral  SpO2: 98% 97% 99% 100%  Weight:      Height:       Last BM Date: 11/07/17 General:   Pleasant, cooperative in NAD Head:  Normocephalic and atraumatic. Eyes:   No icterus.   Conjunctiva pink. PERRLA. Ears:  Normal auditory acuity. Neck:  Supple; no masses or thyroidomegaly Lungs: Respirations even and unlabored. Lungs clear to auscultation bilaterally.   No wheezes, crackles, or rhonchi.  Heart:  Regular rate and rhythm;  Without murmur, clicks, rubs or gallops Abdomen:  Soft, nondistended, nontender. Normal bowel sounds. No appreciable masses or hepatomegaly.  No rebound or guarding.  Neurologic:  Alert and oriented x3;  grossly normal neurologically. Skin:  Intact without significant lesions or rashes. Cervical Nodes:  No significant cervical adenopathy. Psych:  Alert and cooperative. Normal affect.  LAB RESULTS: Recent Labs    11/06/17 0816  11/06/17 2103 11/07/17 0257 11/07/17 0858  WBC 6.8  --   --   --   --   HGB 11.4*   < > 10.3* 9.5* 10.4*  HCT 34.1*   < > 31.2* 28.9* 31.1*  PLT 226  --   --   --   --    < > = values in this interval not displayed.   BMET Recent Labs    11/06/17 0816 11/07/17 0257  NA 136 138  K 3.7 3.4*  CL 103 106  CO2 28 27  GLUCOSE 108* 103*  BUN 31* 13  CREATININE 0.84 0.61  CALCIUM 9.3 8.1*   LFT Recent Labs    11/06/17 0816  PROT 7.0  ALBUMIN 3.6  AST 25  ALT 17  ALKPHOS 87  BILITOT 1.2    PT/INR Recent Labs    11/06/17 1109  LABPROT 12.3  INR 0.92    STUDIES: Ct Abdomen Pelvis W Contrast  Result Date: 11/06/2017 CLINICAL DATA:  Generalized abdominal pain, bloody stool. EXAM: CT ABDOMEN AND PELVIS WITH CONTRAST TECHNIQUE: Multidetector CT imaging of the abdomen and pelvis was performed using the standard protocol following bolus administration of intravenous contrast. CONTRAST:  14mL ISOVUE-300 IOPAMIDOL (ISOVUE-300) INJECTION 61% intravenously, 63mL ISOVUE-300 IOPAMIDOL (ISOVUE-300) INJECTION 61% orally. COMPARISON:  CT scan of May 07, 2017. FINDINGS: Lower chest: No acute abnormality. Hepatobiliary: No gallstones are noted. Stable left hepatic cyst. No other abnormality seen in the liver. Pancreas: Unremarkable. No pancreatic ductal dilatation or surrounding inflammatory changes. Spleen: Normal in size without focal abnormality. Adrenals/Urinary Tract: Adrenal glands appear normal. Stable left renal cyst. No hydronephrosis or renal obstruction is noted. No renal or ureteral calculi are noted. Urinary bladder is unremarkable. Stomach/Bowel: Stomach is within normal limits. Appendix appears normal. No evidence of bowel wall thickening, distention, or inflammatory changes. Sigmoid diverticulosis is noted without inflammation. Vascular/Lymphatic: Aortic atherosclerosis. No enlarged abdominal or pelvic lymph nodes. Reproductive: Uterus and bilateral adnexa are unremarkable. Other: No abdominal wall hernia or abnormality. No abdominopelvic ascites. 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 No acute osseous abnormality is noted. IMPRESSION: Sigmoid diverticulosis without inflammation. Aortic atherosclerosis. Bilateral L5 pars defects. No acute abnormality seen in the abdomen or pelvis. Electronically Signed   By: Marijo Conception, M.D.   On: 11/06/2017 13:26      Impression / Plan:   ALAYZIA PAVLOCK is a 71  y.o. y/o female with with history of diverticular bleeding requiring embolization in the past multiple times, presents with bright red blood per rectum  Likely diverticular bleeding versus bleeding from the previously known rectal ulcer We will plan on colonoscopy in 1-2 days GI unit is under construction this weekend and is only available for emergencies Since patient is hemodynamically stable and has not required packed red blood cell transfusion, we will need to do this procedure once the endoscopy unit is open.  Likely Sunday. Will need colonoscopy prep prior, plan on colonoscopy prep on Saturday and procedure on Sunday. If patient has frank GI bleeding requiring with hemodynamic changes requiring packed red blood cell transfusion, would recommend RBC scan and vascular consult for embolization given her previous history of significant bleeding from her diverticuli requiring embolization in the past  Would also recommend surgery consult given her previous multiple diverticular bleeding, to discuss need for partial colon resection to prevent further episodes of bleeding. Would recommend clear liquid diet for now Serial CBCs and transfuse as needed Avoid NSAIDs Continue Protonix 40 IV twice daily   Thank you for involving me in the care of this patient.      LOS: 1 day   Virgel Manifold, MD  11/07/2017, 4:28 PM

## 2017-11-07 NOTE — Progress Notes (Signed)
[facility] [place] NAME: Holly Green    MR#:  [id number]  DATE OF BIRTH:  1946-11-05  SUBJECTIVE:   Patient presents to the emergency room with bright red blood per rectum since Thursday.  She had one bowel movement with bright red blood per rectum today.  Denies any abdominal pain.  No nausea vomiting. REVIEW OF SYSTEMS:   Review of Systems  Constitutional: Negative for chills, fever and weight loss.  HENT: Negative for ear discharge, ear pain and nosebleeds.   Eyes: Negative for blurred vision, pain and discharge.  Respiratory: Negative for sputum production, shortness of breath, wheezing and stridor.   Cardiovascular: Negative for chest pain, palpitations, orthopnea and PND.  Gastrointestinal: Positive for blood in stool. Negative for abdominal pain, diarrhea, nausea and vomiting.  Genitourinary: Negative for frequency and urgency.  Musculoskeletal: Negative for back pain and joint pain.  Neurological: Negative for sensory change, speech change, focal weakness and weakness.  Psychiatric/Behavioral: Negative for depression and hallucinations. The patient is not nervous/anxious.    Tolerating Diet:yes Tolerating PT: Ambulatory  DRUG ALLERGIES:   Allergies  Allergen Reactions  . Aspirin Other (See Comments)    Diverticulosis    VITALS:  Blood pressure (!) 148/79, pulse 78, temperature 98.8 F (37.1 C), temperature source Oral, resp. rate 18, height 5\' 6"  (1.676 m), weight 81.6 kg (180 lb), SpO2 100 %.  PHYSICAL EXAMINATION:   Physical Exam  GENERAL:  71 y.o.-year-old patient lying in the bed with no acute distress.  EYES: Pupils equal, round, reactive to light and accommodation. No scleral icterus. Extraocular muscles intact.  HEENT: Head atraumatic, normocephalic. Oropharynx and nasopharynx clear.  NECK:  Supple, no jugular venous distention. No thyroid enlargement, no tenderness.  LUNGS: Normal breath sounds  bilaterally, no wheezing, rales, rhonchi. No use of accessory muscles of respiration.  CARDIOVASCULAR: S1, S2 normal. No murmurs, rubs, or gallops.  ABDOMEN: Soft, nontender, nondistended. Bowel sounds present. No organomegaly or mass.  EXTREMITIES: No cyanosis, clubbing or edema b/l.    NEUROLOGIC: Cranial nerves II through XII are intact. No focal Motor or sensory deficits b/l.   PSYCHIATRIC:  patient is alert and oriented x 3.  SKIN: No obvious rash, lesion, or ulcer.   LABORATORY PANEL:  CBC Recent Labs  Lab 11/06/17 0816  11/07/17 0858  WBC 6.8  --   --   HGB 11.4*   < > 10.4*  HCT 34.1*   < > 31.1*  PLT 226  --   --    < > = values in this interval not displayed.    Chemistries  Recent Labs  Lab 11/06/17 0816 11/07/17 0257  NA 136 138  K 3.7 3.4*  CL 103 106  CO2 28 27  GLUCOSE 108* 103*  BUN 31* 13  CREATININE 0.84 0.61  CALCIUM 9.3 8.1*  AST 25  --   ALT 17  --   ALKPHOS 87  --   BILITOT 1.2  --    Cardiac Enzymes No results for input(s): TROPONINI in the last 168 hours. RADIOLOGY:  Ct Abdomen Pelvis W Contrast  Result Date: 11/06/2017 CLINICAL DATA:  Generalized abdominal pain, bloody stool. EXAM: CT ABDOMEN AND PELVIS WITH CONTRAST TECHNIQUE: Multidetector CT imaging of the abdomen and pelvis was performed using the standard protocol following bolus administration of intravenous contrast. CONTRAST:  155mL ISOVUE-300 IOPAMIDOL (ISOVUE-300) INJECTION 61% intravenously, 76mL ISOVUE-300 IOPAMIDOL (ISOVUE-300) INJECTION 61% orally. COMPARISON:  CT scan of May 07, 2017. FINDINGS: Lower chest: No acute abnormality. Hepatobiliary: No gallstones are noted. Stable left hepatic cyst. No other abnormality seen in the liver. Pancreas: Unremarkable. No pancreatic ductal dilatation or surrounding inflammatory changes. Spleen: Normal in size without focal abnormality. Adrenals/Urinary Tract: Adrenal glands appear normal. Stable left renal cyst. No hydronephrosis or renal  obstruction is noted. No renal or ureteral calculi are noted. Urinary bladder is unremarkable. Stomach/Bowel: Stomach is within normal limits. Appendix appears normal. No evidence of bowel wall thickening, distention, or inflammatory changes. Sigmoid diverticulosis is noted without inflammation. Vascular/Lymphatic: Aortic atherosclerosis. No enlarged abdominal or pelvic lymph nodes. Reproductive: Uterus and bilateral adnexa are unremarkable. Other: No abdominal wall hernia or abnormality. No abdominopelvic ascites. 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 No acute osseous abnormality is noted. IMPRESSION: Sigmoid diverticulosis without inflammation. Aortic atherosclerosis. Bilateral L5 pars defects. No acute abnormality seen in the abdomen or pelvis. Electronically Signed   By: Marijo Conception, M.D.   On: 11/06/2017 13:26   ASSESSMENT AND PLAN:   Holly Green  is a 72 y.o. female with a known history of diverticular disease, GERD, hyperlipidemia, hypertension, nephrolithiasis, abdominal aortic atherosclerosis presented to the emergency room with rectal bleed since this morning.  Patient had 4 episodes of bright red blood per rectum.  Has abdominal discomfort which is aching in nature 3 out of 10 on a scale of 1-10.  1.  GI bleed/lower appears diverticular with prior history of diverticular bleeds in the past requiring multiple blood transfusion and embolization in 2015 -Clear liquid diet -Hemoglobin so far stable -GI consultation appreciated.  Plans for colonoscopy possibly on Sunday noted -Surgical consultation if continues to bleed given recurrent diverticular bleed.If she remains stable this can be done as outpatient  2.  Hypertension continue home meds  3.  History of peptic ulcer disease -PPI  4.  Hyperlipidemia Continue statins-  5.  DVT prophylaxis SCDs and teds  Case discussed with Care Management/Social  Worker. Management plans discussed with the patient, family and they are in agreement.  CODE STATUS: Full  DVT Prophylaxis: SCD  TOTAL TIME TAKING CARE OF THIS PATIENT: *25* minutes.  >50% time spent on counselling and coordination of care  POSSIBLE D/C IN 1-2 DAYS, DEPENDING ON CLINICAL CONDITION.  Note: This dictation was prepared with Dragon dictation along with smaller phrase technology. Any transcriptional errors that result from this process are unintentional.  Fritzi Mandes M.D on 11/07/2017 at 5:53 PM  Between 7am to 6pm - Pager - [phone]  After 6pm go to [email or web address] - Proofreader  [facility]  Office  [phone]  CC: Primary care physician; Arnetha Courser, MDPatient ID: Holly Green, female   DOB: 1946-01-04, 72 y.o.   MRN: [id number]

## 2017-11-07 NOTE — Plan of Care (Signed)
  Progressing Education: Knowledge of General Education information will improve 11/07/2017 1427 - Progressing by Milderd Meager, RN Health Behavior/Discharge Planning: Ability to manage health-related needs will improve 11/07/2017 1427 - Progressing by Milderd Meager, RN Clinical Measurements: Ability to maintain clinical measurements within normal limits will improve 11/07/2017 1427 - Progressing by Milderd Meager, RN Will remain free from infection 11/07/2017 1427 - Progressing by Milderd Meager, RN Diagnostic test results will improve 11/07/2017 1427 - Progressing by Milderd Meager, RN Respiratory complications will improve 11/07/2017 1427 - Progressing by Milderd Meager, RN Cardiovascular complication will be avoided 11/07/2017 1427 - Progressing by Milderd Meager, RN Activity: Risk for activity intolerance will decrease 11/07/2017 1427 - Progressing by Milderd Meager, RN Nutrition: Adequate nutrition will be maintained 11/07/2017 1427 - Progressing by Milderd Meager, RN Coping: Level of anxiety will decrease 11/07/2017 1427 - Progressing by Milderd Meager, RN Elimination: Will not experience complications related to bowel motility 11/07/2017 1427 - Progressing by Milderd Meager, RN Will not experience complications related to urinary retention 11/07/2017 1427 - Progressing by Milderd Meager, RN Pain Managment: General experience of comfort will improve 11/07/2017 1427 - Progressing by Milderd Meager, RN Safety: Ability to remain free from injury will improve 11/07/2017 1427 - Progressing by Milderd Meager, RN Skin Integrity: Risk for impaired skin integrity will decrease 11/07/2017 1427 - Progressing by Milderd Meager, RN Education: Ability to identify signs and symptoms of gastrointestinal bleeding will improve 11/07/2017 1427 - Progressing by Milderd Meager, RN Bowel/Gastric: Will show no signs and  symptoms of gastrointestinal bleeding 11/07/2017 1427 - Progressing by Milderd Meager, RN Fluid Volume: Will show no signs and symptoms of excessive bleeding 11/07/2017 1427 - Progressing by Milderd Meager, RN Clinical Measurements: Complications related to the disease process, condition or treatment will be avoided or minimized 11/07/2017 1427 - Progressing by Milderd Meager, RN

## 2017-11-08 LAB — HEMOGLOBIN
Hemoglobin: 10.3 g/dL — ABNORMAL LOW (ref 12.0–16.0)
Hemoglobin: 10.8 g/dL — ABNORMAL LOW (ref 12.0–16.0)

## 2017-11-08 MED ORDER — PEG 3350-KCL-NA BICARB-NACL 420 G PO SOLR
4000.0000 mL | Freq: Once | ORAL | Status: AC
  Administered 2017-11-08: 4000 mL via ORAL
  Filled 2017-11-08: qty 4000

## 2017-11-08 NOTE — Progress Notes (Signed)
Patient a&o, vss. No complaints. Patient sipping on golytely prep as ordered. Continue to monitor.

## 2017-11-08 NOTE — Progress Notes (Signed)
Pt remaining alert and oriented. Pt started on half of the bowel prep as per Md order. Tolerated bowel prep without difficulty. Started having stools this evening. Stools are brown with some Red tinge. No complaints of dizziness or lightheadedness. Up to bedside commode with assistance. No complaints of pain.

## 2017-11-08 NOTE — Progress Notes (Signed)
Holly Antigua, MD [address] [place], [place], [place], [address] [address], [place], [place], [place], [address] Phone: [phone]  Fax: [phone]   Subjective: Patient remains hemodynamically stable.  Has not required blood transfusions. BM are now brown with no blood.  No abdominal pain tolerating clear liquids.   Objective: Vital signs in last 24 hours: Vitals:   11/07/17 1530 11/07/17 1944 11/08/17 0447 11/08/17 0724  BP: (!) 148/79 (!) 156/78 (!) 144/68 133/72  Pulse: 78 61 65 (!) 59  Resp: 18 18 18  (!) 1  Temp: 98.8 F (37.1 C) 97.9 F (36.6 C) 98.8 F (37.1 C) 98.1 F (36.7 C)  TempSrc: Oral Oral Oral Oral  SpO2: 100% 100% 96% 99%  Weight:      Height:       Weight change:   Intake/Output Summary (Last 24 hours) at 11/08/2017 1155 Last data filed at 11/08/2017 0800 Gross per 24 hour  Intake 1200 ml  Output -  Net 1200 ml     Exam: Abd: Soft, NT/ND, No HSM Skin: Warm, no rashes Neck: Supple, Trachea midline Gen: NAD, AAOX3, Normal affect  Lab Results: Labs reviewed.  Micro Results: No results found for this or any previous visit (from the past 240 hour(s)). Studies/Results: Ct Abdomen Pelvis W Contrast  Result Date: 11/06/2017 CLINICAL DATA:  Generalized abdominal pain, bloody stool. EXAM: CT ABDOMEN AND PELVIS WITH CONTRAST TECHNIQUE: Multidetector CT imaging of the abdomen and pelvis was performed using the standard protocol following bolus administration of intravenous contrast. CONTRAST:  176mL ISOVUE-300 IOPAMIDOL (ISOVUE-300) INJECTION 61% intravenously, 22mL ISOVUE-300 IOPAMIDOL (ISOVUE-300) INJECTION 61% orally. COMPARISON:  CT scan of May 07, 2017. FINDINGS: Lower chest: No acute abnormality. Hepatobiliary: No gallstones are noted. Stable left hepatic cyst. No other abnormality seen in the liver. Pancreas: Unremarkable. No pancreatic ductal dilatation or surrounding inflammatory changes. Spleen: Normal in size without focal  abnormality. Adrenals/Urinary Tract: Adrenal glands appear normal. Stable left renal cyst. No hydronephrosis or renal obstruction is noted. No renal or ureteral calculi are noted. Urinary bladder is unremarkable. Stomach/Bowel: Stomach is within normal limits. Appendix appears normal. No evidence of bowel wall thickening, distention, or inflammatory changes. Sigmoid diverticulosis is noted without inflammation. Vascular/Lymphatic: Aortic atherosclerosis. No enlarged abdominal or pelvic lymph nodes. Reproductive: Uterus and bilateral adnexa are unremarkable. Other: No abdominal wall hernia or abnormality. No abdominopelvic ascites. 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 No acute osseous abnormality is noted. IMPRESSION: Sigmoid diverticulosis without inflammation. Aortic atherosclerosis. Bilateral L5 pars defects. No acute abnormality seen in the abdomen or pelvis. Electronically Signed   By: Marijo Conception, M.D.   On: 11/06/2017 13:26   Medications:  Scheduled Meds: . bisoprolol-hydrochlorothiazide  1 tablet Oral Daily  . calcium-vitamin D  1 tablet Oral Q breakfast  . lisinopril  5 mg Oral Daily  . pantoprazole  40 mg Oral Daily  . pravastatin  10 mg Oral q1800   Continuous Infusions: PRN Meds:.acetaminophen **OR** acetaminophen, fluticasone, guaiFENesin-dextromethorphan, morphine injection, ondansetron **OR** ondansetron (ZOFRAN) IV   Assessment: Active Problems:   GI bleed    Plan: We will plan on colonoscopy to evaluate for source of GI bleeding Likely diverticular bleeding given her previous history of the same Patient should get a surgical evaluation given her repeated episodes of GI bleeding and be evaluated for possible partial colon resection to prevent recurrent episodes If colonoscopy is negative, EGD can be considered at  that time.   LOS: 2 days   Holly Antigua, MD 11/08/2017, 11:55 AM

## 2017-11-08 NOTE — Progress Notes (Signed)
Patient is A&O, states she has had 4 BM today, last BM Student nurse observed was formed with scant amount of bright red blood.  Denies lightheadedness/dizziness, Denies sharp pains in lower abdominal quadrants, No abdominal distention noted. Patient continues to sip on the golytely prep, approximately 1/4 of the container left. Will continue to monitor for change.  Dorna Bloom SN, [facility]

## 2017-11-08 NOTE — Progress Notes (Signed)
[facility] [place] NAME: Holly Green    MR#:  [id number]  DATE OF BIRTH:  Sep 12, 1946  SUBJECTIVE:   Patient presents to the emergency room with bright red blood per rectum since Thursday.   Denies any abdominal pain.  No nausea vomiting. She has started her colonoscopy prep No active bleeding noted REVIEW OF SYSTEMS:   Review of Systems  Constitutional: Negative for chills, fever and weight loss.  HENT: Negative for ear discharge, ear pain and nosebleeds.   Eyes: Negative for blurred vision, pain and discharge.  Respiratory: Negative for sputum production, shortness of breath, wheezing and stridor.   Cardiovascular: Negative for chest pain, palpitations, orthopnea and PND.  Gastrointestinal: Positive for blood in stool. Negative for abdominal pain, diarrhea, nausea and vomiting.  Genitourinary: Negative for frequency and urgency.  Musculoskeletal: Negative for back pain and joint pain.  Neurological: Negative for sensory change, speech change, focal weakness and weakness.  Psychiatric/Behavioral: Negative for depression and hallucinations. The patient is not nervous/anxious.    Tolerating Diet:yes Tolerating PT: Ambulatory  DRUG ALLERGIES:   Allergies  Allergen Reactions  . Aspirin Other (See Comments)    Diverticulosis    VITALS:  Blood pressure 133/72, pulse (!) 59, temperature 98.1 F (36.7 C), temperature source Oral, resp. rate (!) 1, height 5\' 6"  (1.676 m), weight 81.6 kg (180 lb), SpO2 99 %.  PHYSICAL EXAMINATION:   Physical Exam  GENERAL:  71 y.o.-year-old patient lying in the bed with no acute distress.  EYES: Pupils equal, round, reactive to light and accommodation. No scleral icterus. Extraocular muscles intact.  HEENT: Head atraumatic, normocephalic. Oropharynx and nasopharynx clear.  NECK:  Supple, no jugular venous distention. No thyroid enlargement, no tenderness.  LUNGS: Normal breath sounds  bilaterally, no wheezing, rales, rhonchi. No use of accessory muscles of respiration.  CARDIOVASCULAR: S1, S2 normal. No murmurs, rubs, or gallops.  ABDOMEN: Soft, nontender, nondistended. Bowel sounds present. No organomegaly or mass.  EXTREMITIES: No cyanosis, clubbing or edema b/l.    NEUROLOGIC: Cranial nerves II through XII are intact. No focal Motor or sensory deficits b/l.   PSYCHIATRIC:  patient is alert and oriented x 3.  SKIN: No obvious rash, lesion, or ulcer.   LABORATORY PANEL:  CBC Recent Labs  Lab 11/06/17 0816  11/07/17 0858 11/08/17 0445  WBC 6.8  --   --   --   HGB 11.4*   < > 10.4* 10.3*  HCT 34.1*   < > 31.1*  --   PLT 226  --   --   --    < > = values in this interval not displayed.    Chemistries  Recent Labs  Lab 11/06/17 0816 11/07/17 0257  NA 136 138  K 3.7 3.4*  CL 103 106  CO2 28 27  GLUCOSE 108* 103*  BUN 31* 13  CREATININE 0.84 0.61  CALCIUM 9.3 8.1*  AST 25  --   ALT 17  --   ALKPHOS 87  --   BILITOT 1.2  --    Cardiac Enzymes No results for input(s): TROPONINI in the last 168 hours. RADIOLOGY:  No results found. ASSESSMENT AND PLAN:   Holly Green  is a 71 y.o. female with a known history of diverticular disease, GERD, hyperlipidemia, hypertension, nephrolithiasis, abdominal aortic atherosclerosis presented to the emergency room with rectal bleed since this morning.  Patient had 4 episodes of bright red blood per rectum.  Has abdominal discomfort  which is aching in nature 3 out of 10 on a scale of 1-10.  1.  GI bleed/lower appears diverticular with prior history of diverticular bleeds in the past requiring multiple blood transfusion and embolization in 2015 -Clear liquid diet -Hemoglobin so far stable -GI consultation appreciated.  Plans for colonoscopy possibly on Sunday noted -Surgical consultation if continues to bleed given recurrent diverticular bleed.If she remains stable this can be done as outpatient  2.  Hypertension  continue home meds  3.  History of peptic ulcer disease -PPI  4.  Hyperlipidemia Continue statins-  5.  DVT prophylaxis SCDs and teds  Case discussed with Care Management/Social Worker. Management plans discussed with the patient, family and they are in agreement.  CODE STATUS: Full  DVT Prophylaxis: SCD  TOTAL TIME TAKING CARE OF THIS PATIENT: *25* minutes.  >50% time spent on counselling and coordination of care  POSSIBLE D/C IN 1-2 DAYS, DEPENDING ON CLINICAL CONDITION.  Note: This dictation was prepared with Dragon dictation along with smaller phrase technology. Any transcriptional errors that result from this process are unintentional.  Fritzi Mandes M.D on 11/08/2017 at 1:19 PM  Between 7am to 6pm - Pager - [phone]  After 6pm go to [email or web address] - Proofreader  [facility]  Office  [phone]  CC: Primary care physician; Arnetha Courser, MDPatient ID: Holly Green, female   DOB: 10/10/1946, 71 y.o.   MRN: [id number]

## 2017-11-09 ENCOUNTER — Inpatient Hospital Stay: Payer: Medicare Other | Admitting: Anesthesiology

## 2017-11-09 ENCOUNTER — Encounter
Admission: EM | Disposition: A | Discharge status: Home / Self Care | Payer: Self-pay | Source: Home / Self Care | Attending: Internal Medicine

## 2017-11-09 DIAGNOSIS — K5730 Diverticulosis of large intestine without perforation or abscess without bleeding: Secondary | ICD-10-CM

## 2017-11-09 DIAGNOSIS — K921 Melena: Secondary | ICD-10-CM

## 2017-11-09 HISTORY — PX: COLONOSCOPY: SHX5424

## 2017-11-09 HISTORY — PX: ESOPHAGOGASTRODUODENOSCOPY: SHX5428

## 2017-11-09 SURGERY — COLONOSCOPY
Anesthesia: General | Laterality: Left

## 2017-11-09 MED ORDER — PROPOFOL 500 MG/50ML IV EMUL
INTRAVENOUS | Status: AC
  Filled 2017-11-09: qty 50

## 2017-11-09 MED ORDER — LIDOCAINE HCL (PF) 2 % IJ SOLN
INTRAMUSCULAR | Status: DC | PRN
  Administered 2017-11-09: 80 mg via INTRADERMAL

## 2017-11-09 MED ORDER — PROPOFOL 10 MG/ML IV BOLUS
INTRAVENOUS | Status: DC | PRN
  Administered 2017-11-09: 30 mg via INTRAVENOUS
  Administered 2017-11-09: 20 mg via INTRAVENOUS
  Administered 2017-11-09: 50 mg via INTRAVENOUS

## 2017-11-09 MED ORDER — PROPOFOL 500 MG/50ML IV EMUL
INTRAVENOUS | Status: DC | PRN
  Administered 2017-11-09: 125 ug/kg/min via INTRAVENOUS

## 2017-11-09 MED ORDER — SODIUM CHLORIDE 0.9 % IV SOLN
INTRAVENOUS | Status: DC
  Administered 2017-11-09: 08:00:00 via INTRAVENOUS

## 2017-11-09 MED ORDER — ONDANSETRON HCL 4 MG/2ML IJ SOLN: 4.0000 mg | Freq: Once | INTRAMUSCULAR | Status: DC | PRN

## 2017-11-09 MED ORDER — PHENYLEPHRINE HCL 10 MG/ML IJ SOLN
INTRAMUSCULAR | Status: DC | PRN
  Administered 2017-11-09: 100 ug via INTRAVENOUS

## 2017-11-09 MED ORDER — SUCCINYLCHOLINE 20MG/ML (10ML) SYRINGE FOR MEDFUSION PUMP - OPTIME
INTRAMUSCULAR | Status: DC | PRN
  Administered 2017-11-09: 20 mg via INTRAVENOUS

## 2017-11-09 MED ORDER — FENTANYL CITRATE (PF) 100 MCG/2ML IJ SOLN: 25.0000 ug | INTRAMUSCULAR | Status: DC | PRN

## 2017-11-09 NOTE — Discharge Summary (Signed)
[facility] [place] NAME: Holly Green    MR#:  [id number]  DATE OF BIRTH:  1946-05-27  DATE OF ADMISSION:  11/06/2017 ADMITTING PHYSICIAN: Saundra Shelling, MD  DATE OF DISCHARGE: 11/09/2017  PRIMARY CARE PHYSICIAN: Arnetha Courser, MD    ADMISSION DIAGNOSIS:  Left lower quadrant pain [R10.32] Gastrointestinal hemorrhage, unspecified gastrointestinal hemorrhage type [K92.2]  DISCHARGE DIAGNOSIS:  Hematochezia/recal bleed suspected due to recurrent diverticular bleed resolved  SECONDARY DIAGNOSIS:   Past Medical History:  Diagnosis Date  . Abdominal aortic atherosclerosis ([place]) 03/05/2017   CT scan April 2018  . Arthritis   . Bleeding disorder ([place])   . Diverticulitis 2015  . GERD (gastroesophageal reflux disease)   . History of kidney stones   . History of stomach ulcers   . Hyperlipidemia   . Hypertension   . Osteoporosis   . Pre-diabetes   . Prediabetes 10/15/2015  . Spondylolisthesis at L4-L5 level 03/07/2017    HOSPITAL COURSE:   Holly Green a71 y.o.femalewith a known history of diverticular disease, GERD, hyperlipidemia, hypertension, nephrolithiasis, abdominal aortic atherosclerosis presented to the emergency room with rectal bleed since this morning.Patient had 4 episodes of bright red blood per rectum.Has abdominal discomfort which is aching in nature 3 out of 10 on a scale of 1-10.  1.GI bleed/lower appears diverticular with prior history of diverticular bleeds in the past requiring multiple blood transfusion and embolization in 2015 -Clear liquid diet -Hemoglobin so far stable -GI consultation appreciated.  -EGD normal -Colonoscopy showed sigmoid diverticulosis possible reason for her recent GI bleed.  No active bleeding noted. -Surgical consultation this can be done as outpatient---defer to PCP  2.  Hypertension continue home meds  3.  History of peptic ulcer disease -PPI  4.   Hyperlipidemia Continue statins-  5.  DVT prophylaxis SCDs and teds  Will discharge later to home.  CONSULTS OBTAINED:  Treatment Team:  Jonathon Bellows, MD  DRUG ALLERGIES:   Allergies  Allergen Reactions  . Aspirin Other (See Comments)    Diverticulosis    DISCHARGE MEDICATIONS:   Allergies as of 11/09/2017      Reactions   Aspirin Other (See Comments)   Diverticulosis      Medication List    TAKE these medications   bisoprolol-hydrochlorothiazide 10-6.25 MG tablet Commonly known as:  ZIAC Take 1 tablet by mouth daily.   Calcium-Vitamin D 600-400 MG-UNIT Tabs Take 1 tablet by mouth daily.   fluticasone 50 MCG/ACT nasal spray Commonly known as:  FLONASE Place 2 sprays into both nostrils daily.   levocetirizine 5 MG tablet Commonly known as:  XYZAL Take 1 tablet (5 mg total) by mouth every evening.   lisinopril 5 MG tablet Commonly known as:  PRINIVIL,ZESTRIL Take 1 tablet (5 mg total) by mouth daily. For BP and kidneys   lovastatin 20 MG tablet Commonly known as:  MEVACOR Take 1 tablet (20 mg total) by mouth at bedtime.       If you experience worsening of your admission symptoms, develop shortness of breath, life threatening emergency, suicidal or homicidal thoughts you must seek medical attention immediately by calling 911 or calling your MD immediately  if symptoms less severe.  You Must read complete instructions/literature along with all the possible adverse reactions/side effects for all the Medicines you take and that have been prescribed to you. Take any new Medicines after you have completely understood and accept all the possible adverse reactions/side effects.   Please note  You were cared for by a hospitalist during your hospital stay. If you have any questions about your discharge medications or the care you received while you were in the hospital after you are discharged, you can call the unit and asked to speak with the hospitalist on call if  the hospitalist that took care of you is not available. Once you are discharged, your primary care physician will handle any further medical issues. Please note that NO REFILLS for any discharge medications will be authorized once you are discharged, as it is imperative that you return to your primary care physician (or establish a relationship with a primary care physician if you do not have one) for your aftercare needs so that they can reassess your need for medications and monitor your lab values. Today   SUBJECTIVE  No new complaints   VITAL SIGNS:  Blood pressure 132/77, pulse 63, temperature 98.5 F (36.9 C), temperature source Oral, resp. rate 18, height 5\' 6"  (1.676 m), weight 81.6 kg (180 lb), SpO2 99 %.  I/O:    Intake/Output Summary (Last 24 hours) at 11/09/2017 1139 Last data filed at 11/09/2017 0907 Gross per 24 hour  Intake 1280 ml  Output -  Net 1280 ml    PHYSICAL EXAMINATION:  GENERAL:  71 y.o.-year-old patient lying in the bed with no acute distress.  EYES: Pupils equal, round, reactive to light and accommodation. No scleral icterus. Extraocular muscles intact.  HEENT: Head atraumatic, normocephalic. Oropharynx and nasopharynx clear.  NECK:  Supple, no jugular venous distention. No thyroid enlargement, no tenderness.  LUNGS: Normal breath sounds bilaterally, no wheezing, rales,rhonchi or crepitation. No use of accessory muscles of respiration.  CARDIOVASCULAR: S1, S2 normal. No murmurs, rubs, or gallops.  ABDOMEN: Soft, non-tender, non-distended. Bowel sounds present. No organomegaly or mass.  EXTREMITIES: No pedal edema, cyanosis, or clubbing.  NEUROLOGIC: Cranial nerves II through XII are intact. Muscle strength 5/5 in all extremities. Sensation intact. Gait not checked.  PSYCHIATRIC: The patient is alert and oriented x 3.  SKIN: No obvious rash, lesion, or ulcer.   DATA REVIEW:   CBC  Recent Labs  Lab 11/06/17 0816  11/07/17 0858  11/08/17 1635  WBC  6.8  --   --   --   --   HGB 11.4*   < > 10.4*   < > 10.8*  HCT 34.1*   < > 31.1*  --   --   PLT 226  --   --   --   --    < > = values in this interval not displayed.    Chemistries  Recent Labs  Lab 11/06/17 0816 11/07/17 0257  NA 136 138  K 3.7 3.4*  CL 103 106  CO2 28 27  GLUCOSE 108* 103*  BUN 31* 13  CREATININE 0.84 0.61  CALCIUM 9.3 8.1*  AST 25  --   ALT 17  --   ALKPHOS 87  --   BILITOT 1.2  --     Microbiology Results   No results found for this or any previous visit (from the past 240 hour(s)).  RADIOLOGY:  No results found.   Management plans discussed with the patient, family and they are in agreement.  CODE STATUS:     Code Status Orders  (From admission, onward)        Start     Ordered   11/06/17 1712  Full code  Continuous     11/06/17 1711    Code  Status History    Date Active Date Inactive Code Status Order ID Comments User Context   This patient has a current code status but no historical code status.      TOTAL TIME TAKING CARE OF THIS PATIENT: *40* minutes.    Fritzi Mandes M.D on 11/09/2017 at 11:39 AM  Between 7am to 6pm - Pager - [phone] After 6pm go to [email or web address] - password [email or web address] [facility]  Office  [phone]  CC: Primary care physician; Arnetha Courser, MD

## 2017-11-09 NOTE — Anesthesia Preprocedure Evaluation (Signed)
Anesthesia Evaluation  Patient identified by MRN, date of birth, ID band Patient awake    Reviewed: Allergy & Precautions, NPO status , Patient's Chart, lab work & pertinent test results  History of Anesthesia Complications Negative for: history of anesthetic complications  Airway Mallampati: II       Dental   Pulmonary neg sleep apnea, neg COPD, former smoker,           Cardiovascular hypertension, Pt. on medications + Peripheral Vascular Disease  + Valvular Problems/Murmurs (murmur, no tx)      Neuro/Psych neg Seizures    GI/Hepatic Neg liver ROS, GERD  Medicated,  Endo/Other  neg diabetes  Renal/GU negative Renal ROS     Musculoskeletal   Abdominal   Peds  Hematology   Anesthesia Other Findings   Reproductive/Obstetrics                             Anesthesia Physical Anesthesia Plan  ASA: II and emergent  Anesthesia Plan: General   Post-op Pain Management:    Induction:   PONV Risk Score and Plan: 3 and TIVA, Propofol infusion and Treatment may vary due to age or medical condition  Airway Management Planned: Nasal Cannula  Additional Equipment:   Intra-op Plan:   Post-operative Plan:   Informed Consent: I have reviewed the patients History and Physical, chart, labs and discussed the procedure including the risks, benefits and alternatives for the proposed anesthesia with the patient or authorized representative who has indicated his/her understanding and acceptance.     Plan Discussed with:   Anesthesia Plan Comments:         Anesthesia Quick Evaluation

## 2017-11-09 NOTE — Progress Notes (Signed)
[facility] [place] NAME: Holly Green    MR#:  [id number]  DATE OF BIRTH:  11-24-1946  SUBJECTIVE:   Patient presents to the emergency room with bright red blood per rectum since Thursday.   Denies any abdominal pain.  No nausea vomiting. Sherecieved colonoscopy prep No active bleeding noted REVIEW OF SYSTEMS:   Review of Systems  Constitutional: Negative for chills, fever and weight loss.  HENT: Negative for ear discharge, ear pain and nosebleeds.   Eyes: Negative for blurred vision, pain and discharge.  Respiratory: Negative for sputum production, shortness of breath, wheezing and stridor.   Cardiovascular: Negative for chest pain, palpitations, orthopnea and PND.  Gastrointestinal: Positive for blood in stool. Negative for abdominal pain, diarrhea, nausea and vomiting.  Genitourinary: Negative for frequency and urgency.  Musculoskeletal: Negative for back pain and joint pain.  Neurological: Negative for sensory change, speech change, focal weakness and weakness.  Psychiatric/Behavioral: Negative for depression and hallucinations. The patient is not nervous/anxious.    Tolerating Diet:yes Tolerating PT: Ambulatory  DRUG ALLERGIES:   Allergies  Allergen Reactions  . Aspirin Other (See Comments)    Diverticulosis    VITALS:  Blood pressure 130/82, pulse 67, temperature 98.6 F (37 C), temperature source Oral, resp. rate 17, height 5\' 6"  (1.676 m), weight 81.6 kg (180 lb), SpO2 98 %.  PHYSICAL EXAMINATION:   Physical Exam  GENERAL:  71 y.o.-year-old patient lying in the bed with no acute distress.  EYES: Pupils equal, round, reactive to light and accommodation. No scleral icterus. Extraocular muscles intact.  HEENT: Head atraumatic, normocephalic. Oropharynx and nasopharynx clear.  NECK:  Supple, no jugular venous distention. No thyroid enlargement, no tenderness.  LUNGS: Normal breath sounds bilaterally, no wheezing,  rales, rhonchi. No use of accessory muscles of respiration.  CARDIOVASCULAR: S1, S2 normal. No murmurs, rubs, or gallops.  ABDOMEN: Soft, nontender, nondistended. Bowel sounds present. No organomegaly or mass.  EXTREMITIES: No cyanosis, clubbing or edema b/l.    NEUROLOGIC: Cranial nerves II through XII are intact. No focal Motor or sensory deficits b/l.   PSYCHIATRIC:  patient is alert and oriented x 3.  SKIN: No obvious rash, lesion, or ulcer.   LABORATORY PANEL:  CBC Recent Labs  Lab 11/06/17 0816  11/07/17 0858  11/08/17 1635  WBC 6.8  --   --   --   --   HGB 11.4*   < > 10.4*   < > 10.8*  HCT 34.1*   < > 31.1*  --   --   PLT 226  --   --   --   --    < > = values in this interval not displayed.    Chemistries  Recent Labs  Lab 11/06/17 0816 11/07/17 0257  NA 136 138  K 3.7 3.4*  CL 103 106  CO2 28 27  GLUCOSE 108* 103*  BUN 31* 13  CREATININE 0.84 0.61  CALCIUM 9.3 8.1*  AST 25  --   ALT 17  --   ALKPHOS 87  --   BILITOT 1.2  --    Cardiac Enzymes No results for input(s): TROPONINI in the last 168 hours. RADIOLOGY:  No results found. ASSESSMENT AND PLAN:   [name]  is a 71 y.o. female with a known history of diverticular disease, GERD, hyperlipidemia, hypertension, nephrolithiasis, abdominal aortic atherosclerosis presented to the emergency room with rectal bleed since this morning.  Patient had 4 episodes of  bright red blood per rectum.  Has abdominal discomfort which is aching in nature 3 out of 10 on a scale of 1-10.  1.GI bleed/lower appears diverticular with prior history of diverticular bleeds in the past requiring multiple blood transfusion and embolization in 2015 -Clear liquid diet -Hemoglobin so far stable -GI consultation appreciated.  Plans for colonoscopy today noted -Surgical consultation if continues to bleed given recurrent diverticular bleed.If she remains stable this can be done as outpatient  2.  Hypertension continue home meds  3.   History of peptic ulcer disease -PPI  4.  Hyperlipidemia Continue statins-  5.  DVT prophylaxis SCDs and teds  Hoping she remains stable and can d/c her later home Spoke with sister and dter  Case discussed with Care Management/Social Worker. Management plans discussed with the patient, family and they are in agreement.  CODE STATUS: Full  DVT Prophylaxis: SCD  TOTAL TIME TAKING CARE OF THIS PATIENT: *25* minutes.  >50% time spent on counselling and coordination of care  POSSIBLE D/C IN 1-2 DAYS, DEPENDING ON CLINICAL CONDITION.  Note: This dictation was prepared with Dragon dictation along with smaller phrase technology. Any transcriptional errors that result from this process are unintentional.  Fritzi Mandes M.D on 11/09/2017 at 7:45 AM  Between 7am to 6pm - Pager - [phone]  After 6pm go to [email or web address] - Proofreader  [facility]  Office  [phone]  CC: Primary care physician; Arnetha Courser, MDPatient ID: Holly Green, female   DOB: [birth year], 71 y.o.   MRN: [id number]

## 2017-11-09 NOTE — Progress Notes (Signed)
Patient discharging home. Instructions given to patient, verbalized understanding. Family to transport patient home.

## 2017-11-09 NOTE — Anesthesia Post-op Follow-up Note (Signed)
Anesthesia QCDR form completed.        

## 2017-11-09 NOTE — Transfer of Care (Signed)
Immediate Anesthesia Transfer of Care Note  Patient: Holly Green  Procedure(s) Performed: COLONOSCOPY (Left ) ESOPHAGOGASTRODUODENOSCOPY (EGD) (Left )  Patient Location: PACU  Anesthesia Type:General  Level of Consciousness: sedated  Airway & Oxygen Therapy: Patient Spontanous Breathing and Patient connected to face mask oxygen  Post-op Assessment: Report given to RN and Post -op Vital signs reviewed and stable  Post vital signs: Reviewed and stable  Last Vitals:  Vitals:   11/09/17 0450 11/09/17 0740  BP: 140/74 130/82  Pulse: 64 67  Resp:  17  Temp: 36.8 C 37 C  SpO2: 99% 98%    Last Pain:  Vitals:   11/09/17 0740  TempSrc: Oral  PainSc:       Patients Stated Pain Goal: 2 (21/30/86 5784)  Complications: No apparent anesthesia complications

## 2017-11-09 NOTE — Anesthesia Postprocedure Evaluation (Signed)
Anesthesia Post Note  Patient: Holly Green  Procedure(s) Performed: COLONOSCOPY (Left ) ESOPHAGOGASTRODUODENOSCOPY (EGD) (Left )  Patient location during evaluation: A-ICU Anesthesia Type: General Level of consciousness: awake and alert Pain management: pain level controlled Vital Signs Assessment: post-procedure vital signs reviewed and stable Respiratory status: spontaneous breathing and respiratory function stable Cardiovascular status: stable Anesthetic complications: no     Last Vitals:  Vitals:   11/09/17 0930 11/09/17 0945  BP: 129/73 136/74  Pulse: 66 66  Resp: 19 17  Temp:    SpO2: 100% 99%    Last Pain:  Vitals:   11/09/17 0915  TempSrc:   PainSc: Asleep                 KEPHART,WILLIAM K

## 2017-11-09 NOTE — Op Note (Addendum)
[facility] Patient Name: Holly Green Procedure Date: 11/09/2017 8:18 AM MRN: [id number] Account #: [id number] Date of Birth: 1946/06/24 Admit Type: Outpatient Age: 71 Room: [facility] ENDO ROOM 4 Gender: Female Note Status: Finalized Procedure:            Upper GI endoscopy Indications:          Hematochezia Providers:            Arleatha Philipps B. Bonna Gains MD, MD Referring MD:         Arnetha Courser (Referring MD) Medicines:            Monitored Anesthesia Care Complications:        No immediate complications. Procedure:            Pre-Anesthesia Assessment:                       - The risks and benefits of the procedure and the                        sedation options and risks were discussed with the                        patient. All questions were answered and informed                        consent was obtained.                       - Patient identification and proposed procedure were                        verified prior to the procedure.                       - ASA Grade Assessment: II - A patient with mild                        systemic disease.                       After obtaining informed consent, the endoscope was                        passed under direct vision. Throughout the procedure,                        the patient's blood pressure, pulse, and oxygen                        saturations were monitored continuously. The Endoscope                        was introduced through the mouth, and advanced to the                        antrum of the stomach. The upper GI endoscopy was                        accomplished with ease. The patient tolerated the  procedure well. Findings:      Patient's oxygen level declined during the procedure and the procedure       had to be aborted. Once the endoscope was removed and oxygen via mask       given by anesthesia staff patient's oxygen level normalized.      Retroflexion in  the stomach could not be performed. The EGD scope was       not advanced into the duodenum. No old or new blood was present in the       Esophagus or stomach. A careful thorough exam for underlying lesions       could not be done of the esophagus and stomach as the procedure had to       be aborted. Impression:           - See above details under findings - no old or new                        blood seen during exam. Recommendation:       - Return patient to hospital ward.                       - Proceed to colonoscopy                       - The findings and recommendations were discussed with                        the patient.                       - Observe patient's clinical course. Procedure Code(s):    --- Professional ---                       [phone], 52, Esophagogastroduodenoscopy, flexible,                        transoral; diagnostic, including collection of                        specimen(s) by brushing or washing, when performed                        (separate procedure) Diagnosis Code(s):    --- Professional ---                       K92.1, Melena (includes Hematochezia) CPT copyright 2016 American Medical Association. All rights reserved. The codes documented in this report are preliminary and upon coder review may  be revised to meet current compliance requirements.  [name], MD Margretta Sidle B. Bonna Gains MD, MD 11/09/2017 8:41:17 AM This report has been signed electronically. Number of Addenda: 0 Note Initiated On: 11/09/2017 8:18 AM      [facility]

## 2017-11-09 NOTE — Anesthesia Procedure Notes (Signed)
Date/Time: 11/09/2017 8:53 AM Performed by: Nelda Marseille, CRNA Pre-anesthesia Checklist: Patient identified, Emergency Drugs available, Suction available, Patient being monitored and Timeout performed Oxygen Delivery Method: Simple face mask

## 2017-11-09 NOTE — Op Note (Addendum)
[facility] Patient Name: Holly Green Procedure Date: 11/09/2017 8:17 AM MRN: [id number] Account #: [id number] Date of Birth: Nov 26, 1945 Admit Type: Outpatient Age: 71 Room: [facility] ENDO ROOM 4 Gender: Female Note Status: Finalized Procedure:            Colonoscopy Indications:          Hematochezia Providers:            Rolin Schult B. Bonna Gains MD, MD Referring MD:         Arnetha Courser (Referring MD) Medicines:            Monitored Anesthesia Care Complications:        No immediate complications. Procedure:            Pre-Anesthesia Assessment:                       - ASA Grade Assessment: II - A patient with mild                        systemic disease.                       - Prior to the procedure, a History and Physical was                        performed, and patient medications, allergies and                        sensitivities were reviewed. The patient's tolerance of                        previous anesthesia was reviewed.                       After obtaining informed consent, the colonoscope was                        passed under direct vision. Throughout the procedure,                        the patient's blood pressure, pulse, and oxygen                        saturations were monitored continuously. The                        Colonoscope was introduced through the anus and                        advanced to the the cecum, identified by appendiceal                        orifice and ileocecal valve. The colonoscopy was                        performed with ease. The patient tolerated the                        procedure well. The quality of the bowel preparation  was good. Findings:      The perianal and digital rectal examinations were normal.      Multiple small and large-mouthed diverticula were found in the sigmoid       colon, descending colon, ascending colon and cecum. The diverticuli were       higher  in quantity in the distal colon than in the proximal colon.      The entire examined colon appeared normal otherwise.      The retroflexed view of the distal rectum and anal verge was normal and       showed no anal or rectal abnormalities.      No old or new blood present throughout the exam Impression:           - Diverticulosis in the sigmoid colon, in the                        descending colon, in the ascending colon and in the                        cecum. These were not bleeding. These were the likely                        source of her hematochezia                       - The entire examined colon is normal otherwise.                       - The distal rectum and anal verge are normal on                        retroflexion view.                       - No specimens collected. Recommendation:       - Surgery consult to discuss need for colectomy in the                        future given her repeated episodes of diverticular                        bleed in the past.                       - Advance diet as tolerated.                       - high fiber diet. Maintain soft stool and use Miralax                        daily                       - Protonix can be discontinued                       - Repeat colonoscopy in 5 years for screening purposes.                       - The findings and recommendations were discussed with  the patient.                       - Return to primary care physician as previously                        scheduled. Procedure Code(s):    --- Professional ---                       [phone], Colonoscopy, flexible; diagnostic, including                        collection of specimen(s) by brushing or washing, when                        performed (separate procedure) Diagnosis Code(s):    --- Professional ---                       K92.1, Melena (includes Hematochezia)                       K57.30, Diverticulosis of large intestine without                         perforation or abscess without bleeding CPT copyright 2016 American Medical Association. All rights reserved. The codes documented in this report are preliminary and upon coder review may  be revised to meet current compliance requirements.  [name], MD Margretta Sidle B. Bonna Gains MD, MD 11/09/2017 9:16:35 AM This report has been signed electronically. Number of Addenda: 0 Note Initiated On: 11/09/2017 8:17 AM Scope Withdrawal Time: 0 hours 13 minutes 21 seconds  Total Procedure Duration: 0 hours 21 minutes 19 seconds  Estimated Blood Loss: Estimated blood loss: none.      [facility]

## 2017-11-09 NOTE — Progress Notes (Signed)
15 minute call to floor. 

## 2017-11-10 LAB — TYPE AND SCREEN
ABO/RH(D): B POS
Antibody Screen: POSITIVE
Unit division: 0
Unit division: 0

## 2017-11-10 LAB — BPAM RBC
Blood Product Expiration Date: 201901072359
Blood Product Expiration Date: 201901072359
Unit Type and Rh: 7300
Unit Type and Rh: 7300

## 2017-11-11 ENCOUNTER — Encounter: Payer: Self-pay | Admitting: Gastroenterology

## 2017-11-17 ENCOUNTER — Encounter: Payer: Self-pay | Admitting: Family Medicine

## 2017-11-17 ENCOUNTER — Ambulatory Visit (INDEPENDENT_AMBULATORY_CARE_PROVIDER_SITE_OTHER): Payer: Medicare Other | Admitting: Family Medicine

## 2017-11-17 VITALS — BP 130/64 | HR 71 | Temp 98.2°F | Wt 181.5 lb

## 2017-11-17 DIAGNOSIS — I10 Essential (primary) hypertension: Secondary | ICD-10-CM

## 2017-11-17 DIAGNOSIS — R7303 Prediabetes: Secondary | ICD-10-CM | POA: Diagnosis not present

## 2017-11-17 DIAGNOSIS — K5730 Diverticulosis of large intestine without perforation or abscess without bleeding: Secondary | ICD-10-CM | POA: Diagnosis not present

## 2017-11-17 DIAGNOSIS — I700 Atherosclerosis of aorta: Secondary | ICD-10-CM

## 2017-11-17 DIAGNOSIS — M810 Age-related osteoporosis without current pathological fracture: Secondary | ICD-10-CM | POA: Diagnosis not present

## 2017-11-17 DIAGNOSIS — K5791 Diverticulosis of intestine, part unspecified, without perforation or abscess with bleeding: Secondary | ICD-10-CM

## 2017-11-17 DIAGNOSIS — E782 Mixed hyperlipidemia: Secondary | ICD-10-CM | POA: Diagnosis not present

## 2017-11-17 DIAGNOSIS — I809 Phlebitis and thrombophlebitis of unspecified site: Secondary | ICD-10-CM

## 2017-11-17 DIAGNOSIS — Z5181 Encounter for therapeutic drug level monitoring: Secondary | ICD-10-CM | POA: Diagnosis not present

## 2017-11-17 MED ORDER — AMOXICILLIN-POT CLAVULANATE 875-125 MG PO TABS: 1.0000 | ORAL_TABLET | Freq: Two times a day (BID) | ORAL | 0 refills | Status: AC

## 2017-11-17 NOTE — Progress Notes (Signed)
BP 130/64 (BP Location: Right Arm, Patient Position: Sitting, Cuff Size: Large)   Pulse 71   Temp 98.2 F (36.8 C) (Oral)   Wt 181 lb 8 oz (82.3 kg)   SpO2 98%   BMI 29.29 kg/m    Subjective:    Patient ID: Holly Green, female    DOB: Nov 11, 1946, 71 y.o.   MRN: [id number]  HPI: Holly Green is a 71 y.o. female  Chief Complaint  Patient presents with  . Hospitalization Follow-up    Sore at IV sight, has also noticed swelling there, right forearm   . Sinusitis    Pt states that she has had alot of pain in the face, seen 08/28/17 by Raquel Sarna     HPI Patient is here for hospital follow-up; admitted 11/06/17; H/H 11.4/34.1 Hemoglobin dropped to 9.5 on 11/07/17, then started to come up and reached 10.8 on day of discharged She had acute lower GI bleed from diverticular bleeding Had EGD and colonoscopy on 11/09/17; next colonoscopy is due in 5 years Multiple diverticular in sigmoid, descending, and ascending colon and cecum Prior to hospitalization, she noticed bright red blood when wiping the day before; then the next day, stuff came pouring out of her like water, looked in the commode and it was blood She stays away from popcorn No SHOB or extreme fatigue; no further bleeding; had a little belly discomfort when she first got home, but none now; no fevers  She also wants to do her regular f/u Prediabetes; a little dry mouth with certain meds; dry in the mornings no blurred vision Lab Results  Component Value Date   HGBA1C 5.7 (H) 06/06/2017   HTN; on bisoprolol-hctz and lisinopril; checks BP at home daily; sometimes they are "kinda high"; 117/73 last night; the one before that was 144/96; goes up and down; does add some salt to her food; going back to urologist, always high at the urologist's office too; was in pain and at doctor's office  High cholesterol; on lovastatin; not many fatty meats  She also had sinusitis in October; coughing, discharge, blowing out all kinds of  stuff; no fevers; she hasn't used anything OTC that could cause her BP to go, like Sudafed; avoided that Treated with xyzal and flonase; did not help her symptoms; pressure over maxillary and frontal sinuses; no muffling in her ears  Osteoporosis; last DEXA was July 2016; runs in the family; took medicine for a while, then just vitamins (calcium plus vit D), still taking those; fell in October, bruised her tailbone  Depression screen [facility] 2/9 11/17/2017 06/20/2017 06/06/2017 03/07/2017 02/20/2017  Decreased Interest 0 0 0 0 0  Down, Depressed, Hopeless 0 0 0 0 0  PHQ - 2 Score 0 0 0 0 0    Relevant past medical, surgical, family and social history reviewed Past Medical History:  Diagnosis Date  . Abdominal aortic atherosclerosis ([place]) 03/05/2017   CT scan April 2018  . Arthritis   . Bleeding disorder ([place])   . Diverticulitis 2015  . GERD (gastroesophageal reflux disease)   . History of kidney stones   . History of stomach ulcers   . Hyperlipidemia   . Hypertension   . Osteoporosis   . Pre-diabetes   . Prediabetes 10/15/2015  . Spondylolisthesis at L4-L5 level 03/07/2017   Past Surgical History:  Procedure Laterality Date  . ABDOMINAL HYSTERECTOMY    . BACK SURGERY  2010  . BREAST CYST ASPIRATION Right 1990's  . COLONOSCOPY Left  11/09/2017   Procedure: COLONOSCOPY;  Surgeon: Virgel Manifold, MD;  Location: [facility] ENDOSCOPY;  Service: Endoscopy;  Laterality: Left;  . CYSTO WITH HYDRODISTENSION N/A 08/15/2017   Procedure: CYSTOSCOPY/HYDRODISTENSION;  Surgeon: Nickie Retort, MD;  Location: [facility] ORS;  Service: Urology;  Laterality: N/A;  . ESOPHAGOGASTRODUODENOSCOPY Left 11/09/2017   Procedure: ESOPHAGOGASTRODUODENOSCOPY (EGD);  Surgeon: Virgel Manifold, MD;  [facility] ENDOSCOPY;  Service: Endoscopy;  Laterality: Left;  . FOOT SURGERY Right    Family History  Problem Relation Age of Onset  . Hypertension Brother   . Heart disease Brother   . Heart failure Brother     . Hypertension Mother   . Stroke Mother   . Stroke Father   . Prostate cancer Father   . Aneurysm Sister   . Breast cancer Sister 73  . Liver disease Maternal Aunt   . Heart failure Paternal Aunt   . Heart disease Paternal Uncle   . Heart attack Brother   . Heart disease Brother   . Breast cancer Maternal Aunt   . Heart disease Paternal Aunt   . Kidney cancer Neg Hx   . Bladder Cancer Neg Hx    Social History   Tobacco Use  . Smoking status: Former Research scientist (life sciences)  . Smokeless tobacco: Never Used  . Tobacco comment: teenage years about 1 year  Substance Use Topics  . Alcohol use: No    Alcohol/week: 0.0 oz  . Drug use: No    Interim medical history since last visit reviewed. Allergies and medications reviewed  Review of Systems Per HPI unless specifically indicated above     Objective:    BP 130/64 (BP Location: Right Arm, Patient Position: Sitting, Cuff Size: Large)   Pulse 71   Temp 98.2 F (36.8 C) (Oral)   Wt 181 lb 8 oz (82.3 kg)   SpO2 98%   BMI 29.29 kg/m   Wt Readings from Last 3 Encounters:  11/17/17 181 lb 8 oz (82.3 kg)  11/06/17 180 lb (81.6 kg)  09/19/17 173 lb 12.8 oz (78.8 kg)    Physical Exam  Constitutional: She appears well-developed and well-nourished. No distress.  HENT:  Head: Normocephalic and atraumatic.  Eyes: EOM are normal. No scleral icterus.  Neck: No thyromegaly present.  Cardiovascular: Normal rate, regular rhythm and normal heart sounds.  No murmur heard. Pulmonary/Chest: Effort normal and breath sounds normal. She has no wheezes.  Abdominal: Soft. Bowel sounds are normal. She exhibits no distension and no mass. There is no tenderness. There is no guarding.  Musculoskeletal: Normal range of motion. She exhibits no edema.  Neurological: She is alert. She exhibits normal muscle tone.  Skin: Skin is warm and dry. She is not diaphoretic. No pallor.  Along right side basilic vein, there is firmness, no erythema locally or proximally;  no evidence of swelling or increased warmth of the area; the entire area of firmness is about the size of a pencil lead and perhaps 1 cm in length  Psychiatric: She has a normal mood and affect. Her behavior is normal. Judgment and thought content normal.    Results for orders placed or performed during the hospital encounter of 11/06/17  Comprehensive metabolic panel  Result Value Ref Range   Sodium 136 135 - 145 mmol/L   Potassium 3.7 3.5 - 5.1 mmol/L   Chloride 103 101 - 111 mmol/L   CO2 28 22 - 32 mmol/L   Glucose, Bld 108 (H) 65 - 99 mg/dL   BUN  31 (H) 6 - 20 mg/dL   Creatinine, Ser 0.84 0.44 - 1.00 mg/dL   Calcium 9.3 8.9 - 10.3 mg/dL   Total Protein 7.0 6.5 - 8.1 g/dL   Albumin 3.6 3.5 - 5.0 g/dL   AST 25 15 - 41 U/L   ALT 17 14 - 54 U/L   Alkaline Phosphatase 87 38 - 126 U/L   Total Bilirubin 1.2 0.3 - 1.2 mg/dL   GFR calc non Af Amer >60 >60 mL/min   GFR calc Af Amer >60 >60 mL/min   Anion gap 5 5 - 15  CBC  Result Value Ref Range   WBC 6.8 3.6 - 11.0 K/uL   RBC 3.82 3.80 - 5.20 MIL/uL   Hemoglobin 11.4 (L) 12.0 - 16.0 g/dL   HCT 34.1 (L) 35.0 - 47.0 %   MCV 89.4 80.0 - 100.0 fL   MCH 29.8 26.0 - 34.0 pg   MCHC 33.3 32.0 - 36.0 g/dL   RDW 13.9 11.5 - 14.5 %   Platelets 226 150 - 440 K/uL  Protime-INR  Result Value Ref Range   Prothrombin Time 12.3 11.4 - 15.2 seconds   INR 0.92   APTT  Result Value Ref Range   aPTT 32 24 - 36 seconds  Hemoglobin and hematocrit, blood  Result Value Ref Range   Hemoglobin 11.0 (L) 12.0 - 16.0 g/dL   HCT 33.1 (L) 35.0 - 47.0 %  Hemoglobin and hematocrit, blood  Result Value Ref Range   Hemoglobin 10.3 (L) 12.0 - 16.0 g/dL   HCT 31.2 (L) 35.0 - 47.0 %  Hemoglobin and hematocrit, blood  Result Value Ref Range   Hemoglobin 9.5 (L) 12.0 - 16.0 g/dL   HCT 28.9 (L) 35.0 - 16.1 %  Basic metabolic panel  Result Value Ref Range   Sodium 138 135 - 145 mmol/L   Potassium 3.4 (L) 3.5 - 5.1 mmol/L   Chloride 106 101 - 111 mmol/L    CO2 27 22 - 32 mmol/L   Glucose, Bld 103 (H) 65 - 99 mg/dL   BUN 13 6 - 20 mg/dL   Creatinine, Ser 0.61 0.44 - 1.00 mg/dL   Calcium 8.1 (L) 8.9 - 10.3 mg/dL   GFR calc non Af Amer >60 >60 mL/min   GFR calc Af Amer >60 >60 mL/min   Anion gap 5 5 - 15  Hemoglobin and hematocrit, blood  Result Value Ref Range   Hemoglobin 10.4 (L) 12.0 - 16.0 g/dL   HCT 31.1 (L) 35.0 - 47.0 %  Hemoglobin  Result Value Ref Range   Hemoglobin 10.3 (L) 12.0 - 16.0 g/dL  Hemoglobin  Result Value Ref Range   Hemoglobin 10.8 (L) 12.0 - 16.0 g/dL  Type and screen Ordered by PROVIDER DEFAULT  Result Value Ref Range   ABO/RH(D) B POS    Antibody Screen POS    Sample Expiration 11/09/2017    Antibody Identification NON SPECIFIC ANTIBODY REACTIVITY    Unit Number [id number]    Blood Component Type RED CELLS,LR    Unit division 00    Status of Unit REL FROM [facility]    Transfusion Status OK TO TRANSFUSE    Crossmatch Result COMPATIBLE    Unit Number [id number]    Blood Component Type RED CELLS,LR    Unit division 00    Status of Unit REL FROM [facility]    Transfusion Status OK TO TRANSFUSE    Crossmatch Result COMPATIBLE   ABO/Rh  Result Value Ref  Range   ABO/RH(D) B POS   BPAM RBC  Result Value Ref Range   Blood Product Unit Number [id number]    Unit Type and Rh 7300    Blood Product Expiration Date [id number]    Blood Product Unit Number [id number]    Unit Type and Rh 7300    Blood Product Expiration Date [id number]       Assessment & Plan:   Problem List Items Addressed This Visit      Cardiovascular and Mediastinum   Essential hypertension (Chronic)    Try the DASH guidelines; monitor at home and call me if not controlled      Abdominal aortic atherosclerosis (HCC)    Goal LDL is less than 70; check fasting cholesterol in mid-January; limit saturated fats        Digestive   GI bleed - Primary    resolved      Diverticulosis of colon, acquired    With recent GI bleed;  now stopped; discussed dietary modifications that work for some people        Musculoskeletal and Integument   Osteoporosis (Chronic)    Discussed fall prevention; calcium plus D, DEXA scan ordered      Relevant Orders   DG Bone Density     Other   Prediabetes    Check A1c in January      Medication monitoring encounter    Check liver and kidneys      Hyperlipidemia (Chronic)    Avoid saturated fats       Other Visit Diagnoses    Phlebitis       colleague looked at this as well; appears to be mild resolving phlebitis; pt to watch and let me know if not resolving       Follow up plan: Return in about 3 weeks (around 12/08/2017) for just for fasting labs; July 15th or later to see Dr. Sanda Klein.  An after-visit summary was printed and given to the patient at [facility].  Please see the patient instructions which may contain other information and recommendations beyond what is mentioned above in the assessment and plan.  Meds ordered this encounter  Medications  . amoxicillin-clavulanate (AUGMENTIN) 875-125 MG tablet    Sig: Take 1 tablet by mouth 2 (two) times daily for 10 days.    Dispense:  20 tablet    Refill:  0    Orders Placed This Encounter  Procedures  . DG Bone Density

## 2017-11-17 NOTE — Assessment & Plan Note (Signed)
With recent GI bleed; now stopped; discussed dietary modifications that work for some people

## 2017-11-17 NOTE — Assessment & Plan Note (Signed)
Avoid saturated fats

## 2017-11-17 NOTE — Patient Instructions (Addendum)
Use warm compresses on the site on your right arm; watch for ascending redness or streaks Call the on-call provider on Christmas if needed  Try to follow the DASH guidelines (DASH stands for Dietary Approaches to Stop Hypertension). Try to limit the sodium in your diet to no more than 1,500mg  of sodium per day. Certainly try to not exceed 2,000 mg per day at the very most. Do not add salt when cooking or at the table.  Check the sodium amount on labels when shopping, and choose items lower in sodium when given a choice. Avoid or limit foods that already contain a lot of sodium. Eat a diet rich in fruits and vegetables and whole grains, and try to lose weight if overweight or obese Let me know if you'd like to switch your medicines to help you save money  Start the antibiotics Please do eat yogurt or kimchi or take a probiotic daily for the next month We want to replace the healthy germs in the gut If you notice foul, watery diarrhea in the next two months, schedule an appointment RIGHT AWAY or go to an urgent care or the emergency room if a holiday or over a weekend  Please do call to schedule your bone density study; the number to schedule one at either [facility] or [facility] is [phone] or [phone]   DASH Eating Plan DASH stands for "Dietary Approaches to Stop Hypertension." The DASH eating plan is a healthy eating plan that has been shown to reduce high blood pressure (hypertension). It may also reduce your risk for type 2 diabetes, heart disease, and stroke. The DASH eating plan may also help with weight loss. What are tips for following this plan? General guidelines  Avoid eating more than 2,300 mg (milligrams) of salt (sodium) a day. If you have hypertension, you may need to reduce your sodium intake to 1,500 mg a day.  Limit alcohol intake to no more than 1 drink a day for nonpregnant women and 2 drinks a day for men. One drink equals 12 oz of  beer, 5 oz of wine, or 1 oz of hard liquor.  Work with your health care provider to maintain a healthy body weight or to lose weight. Ask what an ideal weight is for you.  Get at least 30 minutes of exercise that causes your heart to beat faster (aerobic exercise) most days of the week. Activities may include walking, swimming, or biking.  Work with your health care provider or diet and nutrition specialist (dietitian) to adjust your eating plan to your individual calorie needs. Reading food labels  Check food labels for the amount of sodium per serving. Choose foods with less than 5 percent of the Daily Value of sodium. Generally, foods with less than 300 mg of sodium per serving fit into this eating plan.  To find whole grains, look for the word "whole" as the first word in the ingredient list. Shopping  Buy products labeled as "low-sodium" or "no salt added."  Buy fresh foods. Avoid canned foods and premade or frozen meals. Cooking  Avoid adding salt when cooking. Use salt-free seasonings or herbs instead of table salt or sea salt. Check with your health care provider or pharmacist before using salt substitutes.  Do not fry foods. Cook foods using healthy methods such as baking, boiling, grilling, and broiling instead.  Cook with heart-healthy oils, such as olive, canola, soybean, or sunflower oil. Meal planning   Eat a  balanced diet that includes: ? 5 or more servings of fruits and vegetables each day. At each meal, try to fill half of your plate with fruits and vegetables. ? Up to 6-8 servings of whole grains each day. ? Less than 6 oz of lean meat, poultry, or fish each day. A 3-oz serving of meat is about the same size as a deck of cards. One egg equals 1 oz. ? 2 servings of low-fat dairy each day. ? A serving of nuts, seeds, or beans 5 times each week. ? Heart-healthy fats. Healthy fats called Omega-3 fatty acids are found in foods such as flaxseeds and coldwater fish, like  sardines, salmon, and mackerel.  Limit how much you eat of the following: ? Canned or prepackaged foods. ? Food that is high in trans fat, such as fried foods. ? Food that is high in saturated fat, such as fatty meat. ? Sweets, desserts, sugary drinks, and other foods with added sugar. ? Full-fat dairy products.  Do not salt foods before eating.  Try to eat at least 2 vegetarian meals each week.  Eat more home-cooked food and less restaurant, buffet, and fast food.  When eating at a restaurant, ask that your food be prepared with less salt or no salt, if possible. What foods are recommended? The items listed may not be a complete list. Talk with your dietitian about what dietary choices are best for you. Grains Whole-grain or whole-wheat bread. Whole-grain or whole-wheat pasta. Brown rice. [place]. Bulgur. Whole-grain and low-sodium cereals. Pita bread. Low-fat, low-sodium crackers. Whole-wheat flour tortillas. Vegetables Fresh or frozen vegetables (raw, steamed, roasted, or grilled). Low-sodium or reduced-sodium tomato and vegetable juice. Low-sodium or reduced-sodium tomato sauce and tomato paste. Low-sodium or reduced-sodium canned vegetables. Fruits All fresh, dried, or frozen fruit. Canned fruit in natural juice (without added sugar). Meat and other protein foods Skinless chicken or [place]. Ground chicken or [place]. Pork with fat trimmed off. Fish and seafood. Egg whites. Dried beans, peas, or lentils. Unsalted nuts, nut butters, and seeds. Unsalted canned beans. Lean cuts of beef with fat trimmed off. Low-sodium, lean deli meat. Dairy Low-fat (1%) or fat-free (skim) milk. Fat-free, low-fat, or reduced-fat cheeses. Nonfat, low-sodium ricotta or cottage cheese. Low-fat or nonfat yogurt. Low-fat, low-sodium cheese. Fats and oils Soft margarine without trans fats. Vegetable oil. Low-fat, reduced-fat, or light mayonnaise and salad dressings (reduced-sodium). Canola, safflower,  olive, soybean, and sunflower oils. Avocado. Seasoning and other foods Herbs. Spices. Seasoning mixes without salt. Unsalted popcorn and pretzels. Fat-free sweets. What foods are not recommended? The items listed may not be a complete list. Talk with your dietitian about what dietary choices are best for you. Grains Baked goods made with fat, such as croissants, muffins, or some breads. Dry pasta or rice meal packs. Vegetables Creamed or fried vegetables. Vegetables in a cheese sauce. Regular canned vegetables (not low-sodium or reduced-sodium). Regular canned tomato sauce and paste (not low-sodium or reduced-sodium). Regular tomato and vegetable juice (not low-sodium or reduced-sodium). Angie Fava. Olives. Fruits Canned fruit in a light or heavy syrup. Fried fruit. Fruit in cream or butter sauce. Meat and other protein foods Fatty cuts of meat. Ribs. Fried meat. Berniece Salines. Sausage. Bologna and other processed lunch meats. Salami. Fatback. Hotdogs. Bratwurst. Salted nuts and seeds. Canned beans with added salt. Canned or smoked fish. Whole eggs or egg yolks. Chicken or [place] with skin. Dairy Whole or 2% milk, cream, and half-and-half. Whole or full-fat cream cheese. Whole-fat or sweetened yogurt. Full-fat cheese.  Nondairy creamers. Whipped toppings. Processed cheese and cheese spreads. Fats and oils Butter. Stick margarine. Lard. Shortening. Ghee. Bacon fat. Tropical oils, such as coconut, palm kernel, or palm oil. Seasoning and other foods Salted popcorn and pretzels. Onion salt, garlic salt, seasoned salt, table salt, and sea salt. Worcestershire sauce. Tartar sauce. Barbecue sauce. Teriyaki sauce. Soy sauce, including reduced-sodium. Steak sauce. Canned and packaged gravies. Fish sauce. Oyster sauce. Cocktail sauce. Horseradish that you find on the shelf. Ketchup. Mustard. Meat flavorings and tenderizers. Bouillon cubes. Hot sauce and Tabasco sauce. Premade or packaged marinades. Premade or packaged  taco seasonings. Relishes. Regular salad dressings. Where to find more information:  National Heart, Lung, and [place]: https://wilson-eaton.com/  American Heart Association: www.heart.org Summary  The DASH eating plan is a healthy eating plan that has been shown to reduce high blood pressure (hypertension). It may also reduce your risk for type 2 diabetes, heart disease, and stroke.  With the DASH eating plan, you should limit salt (sodium) intake to 2,300 mg a day. If you have hypertension, you may need to reduce your sodium intake to 1,500 mg a day.  When on the DASH eating plan, aim to eat more fresh fruits and vegetables, whole grains, lean proteins, low-fat dairy, and heart-healthy fats.  Work with your health care provider or diet and nutrition specialist (dietitian) to adjust your eating plan to your individual calorie needs. This information is not intended to replace advice given to you by your health care provider. Make sure you discuss any questions you have with your health care provider. Document Released: 10/31/2011 Document Revised: 11/04/2016 Document Reviewed: 11/04/2016 Elsevier Interactive Patient Education  [employer].

## 2017-11-17 NOTE — Assessment & Plan Note (Signed)
Check liver and kidneys 

## 2017-11-17 NOTE — Assessment & Plan Note (Signed)
Try the DASH guidelines; monitor at home and call me if not controlled

## 2017-11-17 NOTE — Assessment & Plan Note (Signed)
Check A1c in January

## 2017-11-17 NOTE — Assessment & Plan Note (Signed)
resolved 

## 2017-11-17 NOTE — Assessment & Plan Note (Signed)
Goal LDL is less than 70; check fasting cholesterol in mid-January; limit saturated fats

## 2017-11-17 NOTE — Assessment & Plan Note (Signed)
Discussed fall prevention; calcium plus D, DEXA scan ordered

## 2017-12-05 ENCOUNTER — Telehealth: Payer: Self-pay

## 2017-12-05 DIAGNOSIS — R7303 Prediabetes: Secondary | ICD-10-CM

## 2017-12-05 DIAGNOSIS — E782 Mixed hyperlipidemia: Secondary | ICD-10-CM

## 2017-12-05 NOTE — Telephone Encounter (Signed)
Ok to order an A1c and Lipid for this pt per your last note? She would like to come this Monday 12/08/17. Thanks!

## 2017-12-05 NOTE — Telephone Encounter (Signed)
Yes, orders entered

## 2017-12-08 ENCOUNTER — Other Ambulatory Visit: Payer: Self-pay

## 2017-12-08 ENCOUNTER — Ambulatory Visit: Payer: Medicare Other | Admitting: Family Medicine

## 2017-12-08 DIAGNOSIS — E782 Mixed hyperlipidemia: Secondary | ICD-10-CM | POA: Diagnosis not present

## 2017-12-08 DIAGNOSIS — R7303 Prediabetes: Secondary | ICD-10-CM | POA: Diagnosis not present

## 2017-12-09 LAB — LIPID PANEL
Cholesterol: 161 mg/dL (ref <200)
HDL: 91 mg/dL (ref >50)
LDL Cholesterol (Calc): 59 mg/dL (calc)
Non-HDL Cholesterol (Calc): 70 mg/dL (calc) (ref <130)
Total CHOL/HDL Ratio: 1.8 (calc) (ref <5.0)
Triglycerides: 40 mg/dL (ref <150)

## 2017-12-09 LAB — HEMOGLOBIN A1C
Hgb A1c MFr Bld: 5.7 % of total Hgb — ABNORMAL HIGH (ref <5.7)
Mean Plasma Glucose: 117 (calc)
eAG (mmol/L): 6.5 (calc)

## 2017-12-15 ENCOUNTER — Encounter: Payer: Self-pay | Admitting: Gastroenterology

## 2017-12-15 ENCOUNTER — Ambulatory Visit (INDEPENDENT_AMBULATORY_CARE_PROVIDER_SITE_OTHER): Payer: Medicare Other | Admitting: Gastroenterology

## 2017-12-15 ENCOUNTER — Other Ambulatory Visit: Payer: Self-pay

## 2017-12-15 VITALS — BP 164/86 | HR 67 | Wt 180.0 lb

## 2017-12-15 DIAGNOSIS — K5900 Constipation, unspecified: Secondary | ICD-10-CM

## 2017-12-15 DIAGNOSIS — K5791 Diverticulosis of intestine, part unspecified, without perforation or abscess with bleeding: Secondary | ICD-10-CM

## 2017-12-15 MED ORDER — PSYLLIUM 51.7 % PO PACK: 1.0000 | PACK | Freq: Every day | ORAL | 3 refills | Status: DC

## 2017-12-15 NOTE — Patient Instructions (Signed)
F/U 6 months

## 2017-12-15 NOTE — Progress Notes (Signed)
[name], MD [address]  [place]  [place], [place] [address]  Main: [phone]  Fax: [phone]   Primary Care Physician: Arnetha Courser, MD  Primary Gastroenterologist:  Dr. [name]  Chief Complaint  Patient presents with  . Follow-up    CT 11/06/2017 (Abd. pain, bloody stool)    HPI: Holly Green is a 72 y.o. female here for hospital follow-up.  She was seen in GI consult on December 14 due to 1 day history of 3-4 bowel movements with bright red blood per rectum.  She underwent an EGD that was normal on that admission, and a colonoscopy that showed diffuse diverticulosis, and the cecum, ascending colon, descending colon and sigmoid colon.  Majority of the diverticulosis was in the left colon.  None of these were actively bleeding and no intervention was needed during the colonoscopy.  Bright red blood per rectum self resolved, hemoglobin did not drop below baseline and she was discharged at a hemoglobin of 10.8 which is around her baseline.  Patient denies any episodes of melena, or hematochezia since being discharged.  Denies any abdominal pain, nausea vomiting, weight loss, or loss of appetite.  She has history of diverticular bleeds in the past.  In 2015 she had similar symptoms and and colonoscopy was planned but due to significant bleeding, RBC scan was done and was positive for hemorrhage in the right colon likely related to bleeding diverticulum.  Patient underwent coil embolization at that time.  Prior to this in 2009 she also had a positive RBC scan in the mid transverse and descending colon consistent with GI bleed.  In 2012 patient had an upper endoscopy which showed hiatal hernia, erosive gastropathy, colonoscopy was done for a lot of abdominal pain at the time and showed solitary ulcer in the descending colon.    Current Outpatient Medications  Medication Sig Dispense Refill  . bisoprolol-hydrochlorothiazide (ZIAC) 10-6.25 MG tablet  Take 1 tablet by mouth daily. 90 tablet 1  . Calcium Carb-Cholecalciferol (CALCIUM-VITAMIN D) 600-400 MG-UNIT TABS Take 1 tablet by mouth daily.    . fluticasone (FLONASE) 50 MCG/ACT nasal spray Place 2 sprays into both nostrils daily. 16 g 2  . lisinopril (PRINIVIL,ZESTRIL) 5 MG tablet Take 1 tablet (5 mg total) by mouth daily. For BP and kidneys 30 tablet 10  . lovastatin (MEVACOR) 20 MG tablet Take 1 tablet (20 mg total) by mouth at bedtime. 90 tablet 3   No current facility-administered medications for this visit.     Allergies as of 12/15/2017 - Review Complete 12/15/2017  Allergen Reaction Noted  . Aspirin Other (See Comments) 08/08/2017    ROS:  General: Negative for anorexia, weight loss, fever, chills, fatigue, weakness. ENT: Negative for hoarseness, difficulty swallowing , nasal congestion. CV: Negative for chest pain, angina, palpitations, dyspnea on exertion, peripheral edema.  Respiratory: Negative for dyspnea at rest, dyspnea on exertion, cough, sputum, wheezing.  GI: See history of present illness. GU:  Negative for dysuria, hematuria, urinary incontinence, urinary frequency, nocturnal urination.  Endo: Negative for unusual weight change.    Physical Examination:   BP (!) 164/86   Pulse 67   Wt 180 lb (81.6 kg)   BMI 29.05 kg/m   General: Well-nourished, well-developed in no acute distress.  Eyes: No icterus. Conjunctivae pink. Mouth: Oropharyngeal mucosa moist and pink , no lesions erythema or exudate. Abdomen: Bowel sounds are normal, nontender, nondistended, no hepatosplenomegaly or masses, no abdominal bruits or hernia , no rebound or  guarding.   Extremities: No lower extremity edema. No clubbing or deformities. Neuro: Alert and oriented x 3.  Grossly intact. Skin: Warm and dry, no jaundice.   Psych: Alert and cooperative, normal mood and affect.   Labs: CMP     Component Value Date/Time   NA 138 11/07/2017 0257   NA 140 05/07/2016 0930   NA 141  01/16/2014 0514   K 3.4 (L) 11/07/2017 0257   K 3.5 01/16/2014 0514   CL 106 11/07/2017 0257   CL 108 (H) 01/16/2014 0514   CO2 27 11/07/2017 0257   CO2 28 01/16/2014 0514   GLUCOSE 103 (H) 11/07/2017 0257   GLUCOSE 104 (H) 01/16/2014 0514   BUN 13 11/07/2017 0257   BUN 20 05/07/2016 0930   BUN 2 (L) 01/16/2014 0514   CREATININE 0.61 11/07/2017 0257   CREATININE 0.69 06/06/2017 0938   CALCIUM 8.1 (L) 11/07/2017 0257   CALCIUM 7.6 (L) 01/16/2014 0514   PROT 7.0 11/06/2017 0816   PROT 6.9 05/07/2016 0930   PROT 6.2 (L) 01/11/2014 1121   ALBUMIN 3.6 11/06/2017 0816   ALBUMIN 4.1 05/07/2016 0930   ALBUMIN 3.0 (L) 01/11/2014 1121   AST 25 11/06/2017 0816   AST 24 01/11/2014 1121   ALT 17 11/06/2017 0816   ALT 16 01/11/2014 1121   ALKPHOS 87 11/06/2017 0816   ALKPHOS 69 01/11/2014 1121   BILITOT 1.2 11/06/2017 0816   BILITOT 0.8 05/07/2016 0930   BILITOT 0.4 01/11/2014 1121   GFRNONAA >60 11/07/2017 0257   GFRNONAA 89 06/06/2017 0938   GFRAA >60 11/07/2017 0257   GFRAA >89 06/06/2017 0938   Lab Results  Component Value Date   WBC 6.8 11/06/2017   HGB 10.8 (L) 11/08/2017   HCT 31.1 (L) 11/07/2017   MCV 89.4 11/06/2017   PLT 226 11/06/2017    Imaging Studies: No results found.  Assessment and Plan:   Holly Green is a 72 y.o. y/o female presents for hospital follow-up for bright red blood per rectum, attributed to diverticulosis, with previous episodes of diverticular bleeding in 2015 and 2009, requiring coil embolization in 2015.  No further episodes of hematochezia since discharge We will repeat CBC at this time High-fiber diet Patient asked to maintain soft stool We will start Metamucil daily Patient reporting 2-3 loose bowel movements daily, which is her baseline.  Metamucil will help maintain soft stool and help form bulk to her loose stools. If patient develops very loose stools or diarrhea with the Metamucil, she was asked to call us and stop the  medication.  We will have patient seen by surgery due to her repeated episodes of diverticular bleeding, to obtain their opinion for colectomy or partial colectomy.  However, her diverticulosis is diffuse including diverticuli seen in the cecum and ascending colon.  However, most of the diverticuli were in the left colon.  Surgery evaluation would be to see if she would benefit from partial colectomy in the future, especially if she rebleeds.  Patient would like to see surgery as well to obtain their opinion on the matter.  Dr [name]

## 2017-12-16 LAB — CBC WITH DIFFERENTIAL/PLATELET
Basophils Absolute: 0 10*3/uL (ref 0.0–0.2)
Basos: 0 %
EOS (ABSOLUTE): 0.1 10*3/uL (ref 0.0–0.4)
Eos: 2 %
Hematocrit: 34.2 % (ref 34.0–46.6)
Hemoglobin: 11 g/dL — ABNORMAL LOW (ref 11.1–15.9)
Immature Grans (Abs): 0 10*3/uL (ref 0.0–0.1)
Immature Granulocytes: 0 %
Lymphocytes Absolute: 1.5 10*3/uL (ref 0.7–3.1)
Lymphs: 34 %
MCH: 29 pg (ref 26.6–33.0)
MCHC: 32.2 g/dL (ref 31.5–35.7)
MCV: 90 fL (ref 79–97)
Monocytes Absolute: 0.3 10*3/uL (ref 0.1–0.9)
Monocytes: 7 %
Neutrophils Absolute: 2.7 10*3/uL (ref 1.4–7.0)
Neutrophils: 57 %
Platelets: 272 10*3/uL (ref 150–379)
RBC: 3.79 x10E6/uL (ref 3.77–5.28)
RDW: 13.5 % (ref 12.3–15.4)
WBC: 4.6 10*3/uL (ref 3.4–10.8)

## 2017-12-16 LAB — IRON AND TIBC
Iron Saturation: 9 % — CL (ref 15–55)
Iron: 36 ug/dL (ref 27–139)
Total Iron Binding Capacity: 408 ug/dL (ref 250–450)
UIBC: 372 ug/dL — ABNORMAL HIGH (ref 118–369)

## 2017-12-16 LAB — FERRITIN: Ferritin: 39 ng/mL (ref 15–150)

## 2017-12-17 ENCOUNTER — Telehealth: Payer: Self-pay | Admitting: Family Medicine

## 2017-12-17 ENCOUNTER — Other Ambulatory Visit: Payer: Self-pay | Admitting: Gastroenterology

## 2017-12-17 DIAGNOSIS — D508 Other iron deficiency anemias: Secondary | ICD-10-CM

## 2017-12-17 DIAGNOSIS — D500 Iron deficiency anemia secondary to blood loss (chronic): Secondary | ICD-10-CM

## 2017-12-17 DIAGNOSIS — D649 Anemia, unspecified: Secondary | ICD-10-CM | POA: Insufficient documentation

## 2017-12-17 MED ORDER — FERROUS SULFATE 325 (65 FE) MG PO TABS: 325.0000 mg | ORAL_TABLET | Freq: Every day | ORAL | 3 refills | Status: DC

## 2017-12-17 NOTE — Telephone Encounter (Signed)
Left detailed voicemail

## 2017-12-17 NOTE — Assessment & Plan Note (Signed)
Check TIBC, iron, ferriting, CBC in 6 weeks

## 2017-12-17 NOTE — Telephone Encounter (Signed)
Please contact patient; ask her to come by in 6 weeks for a recheck of her labs to monitor her anemia I'll put in the orders If she notices any recurrence of bleeding, dark stools, abd pain, etc, let us or her GI doctor know right away Thank you

## 2017-12-19 ENCOUNTER — Telehealth: Payer: Self-pay | Admitting: Gastroenterology

## 2017-12-19 DIAGNOSIS — K5731 Diverticulosis of large intestine without perforation or abscess with bleeding: Secondary | ICD-10-CM | POA: Diagnosis not present

## 2017-12-19 NOTE — Telephone Encounter (Signed)
Please call patient as she has some question regarding the iron pills.

## 2017-12-24 NOTE — Telephone Encounter (Signed)
Pt did not know she was to be taking Iron. I will be sending the name of the medication and how to take it to her My Chart.

## 2018-01-08 ENCOUNTER — Other Ambulatory Visit: Payer: Self-pay | Admitting: Family Medicine

## 2018-01-08 DIAGNOSIS — E785 Hyperlipidemia, unspecified: Secondary | ICD-10-CM

## 2018-01-08 NOTE — Telephone Encounter (Signed)
Last SGPT and lipids reviewed Rx approved 

## 2018-01-10 NOTE — Progress Notes (Signed)
Signing off on lab order note

## 2018-02-02 ENCOUNTER — Other Ambulatory Visit: Payer: Self-pay

## 2018-02-02 DIAGNOSIS — D508 Other iron deficiency anemias: Secondary | ICD-10-CM

## 2018-02-03 ENCOUNTER — Other Ambulatory Visit: Payer: Self-pay | Admitting: Family Medicine

## 2018-02-03 DIAGNOSIS — D500 Iron deficiency anemia secondary to blood loss (chronic): Secondary | ICD-10-CM

## 2018-02-03 DIAGNOSIS — D508 Other iron deficiency anemias: Secondary | ICD-10-CM

## 2018-02-03 LAB — CBC
HCT: 34.2 % — ABNORMAL LOW (ref 35.0–45.0)
Hemoglobin: 11.4 g/dL — ABNORMAL LOW (ref 11.7–15.5)
MCH: 28.6 pg (ref 27.0–33.0)
MCHC: 33.3 g/dL (ref 32.0–36.0)
MCV: 85.7 fL (ref 80.0–100.0)
MPV: 8.8 fL (ref 7.5–12.5)
Platelets: 254 10*3/uL (ref 140–400)
RBC: 3.99 10*6/uL (ref 3.80–5.10)
RDW: 13.3 % (ref 11.0–15.0)
WBC: 4.2 10*3/uL (ref 3.8–10.8)

## 2018-02-03 LAB — IRON,TIBC AND FERRITIN PANEL
%SAT: 10 % (calc) — ABNORMAL LOW (ref 11–50)
Ferritin: 25 ng/mL (ref 20–288)
Iron: 41 ug/dL — ABNORMAL LOW (ref 45–160)
TIBC: 393 mcg/dL (calc) (ref 250–450)

## 2018-02-03 MED ORDER — FERROUS SULFATE 325 (65 FE) MG PO TABS: ORAL_TABLET | ORAL | 2 refills | Status: DC

## 2018-02-03 NOTE — Assessment & Plan Note (Signed)
Check labs in 2 months

## 2018-02-03 NOTE — Progress Notes (Signed)
Decrease iron therapy to two or three days a week; recheck labs in 2 months

## 2018-03-23 ENCOUNTER — Ambulatory Visit: Payer: Medicare Other | Admitting: Urology

## 2018-04-01 ENCOUNTER — Other Ambulatory Visit: Payer: Self-pay | Admitting: Family Medicine

## 2018-04-01 DIAGNOSIS — Z1231 Encounter for screening mammogram for malignant neoplasm of breast: Secondary | ICD-10-CM

## 2018-04-17 ENCOUNTER — Ambulatory Visit
Admission: RE | Admit: 2018-04-17 | Discharge: 2018-04-17 | Disposition: A | Discharge status: Home / Self Care | Payer: Medicare Other | Source: Ambulatory Visit | Attending: Family Medicine | Admitting: Family Medicine

## 2018-04-17 DIAGNOSIS — Z1231 Encounter for screening mammogram for malignant neoplasm of breast: Secondary | ICD-10-CM | POA: Diagnosis present

## 2018-04-27 NOTE — Progress Notes (Signed)
04/29/2018 8:28 PM   Holly Green [birth year] [id number]  Referring provider: Arnetha Courser, MD [address] [place], [place] [address]  Chief Complaint  Patient presents with  . Follow-up    HPI: The patient is a 72 year old African American female who presents for follow-up after undergoing a hydrodistention for chronic pelvic pain in 07/2017.  She had been following prior to the hydrodistention with Dr. Matilde Sprang with a low suspicion that her discomfort was a result of interstitial cystitis.  He arranged for undergo this hydrodistention to see if she had symptomatic improvement after this procedure.  If her symptoms did not resolve, the plan was to have ruled IC out as a source of her discomfort.  She notes a 90% improvement in her symptoms.  Her biggest complaint was that she had pain before and after urination.  She did not have pain with urination.  This is resolved.  Her frequency is now greatly reduced.  She voids approximately every 3-4 hours during the day.  She feels this hydrodistention was very helpful.  Background: She states she has pain x 3 months. It is located in the suprapubic area and radiates to her sides with the left >right. She describes the pain as cramping in sensation. At first, the pain occurred after urination. The pain is now occurring with the urge to urinate, but it is still worse after urinating. She was given an antibiotics on 02/20/2017 with no relief of symptoms. She has nocturia x 3, frequency x 4-8, urge incontinence x 0-3. Contrast CT performed on 03/03/2017 normal.  Sheis experiencing frequency of urination, pain in her stomach when urinating and nocturia. She is still experiencing left sided suprapubic pain that radiates into her left lower quadrant. She states the pain is present when she has the urge to urinate and intensifies once she empties her bladder. Vesicare failed  She has noticed that with her first morning void she  has right sided pain when she lays down.  She states she does not get up with the first urge to go and lets some time go by.  If she does not lay back down in the morning, the pain does not occur.  The pain is 1/10.  Patient denies any gross hematuria, dysuria or suprapubic/flank pain.  Patient denies any fevers, chills, nausea or vomiting.   Her UA today is negative.       PMH: Past Medical History:  Diagnosis Date  . Abdominal aortic atherosclerosis ([facility]) 03/05/2017   CT scan April 2018  . Arthritis   . Bleeding disorder ([facility])   . Diverticulitis 2015  . GERD (gastroesophageal reflux disease)   . History of kidney stones   . History of stomach ulcers   . Hyperlipidemia   . Hypertension   . Osteoporosis   . Pre-diabetes   . Prediabetes 10/15/2015  . Spondylolisthesis at L4-L5 level 03/07/2017    Surgical History: Past Surgical History:  Procedure Laterality Date  . ABDOMINAL HYSTERECTOMY    . BACK SURGERY  2010  . BREAST CYST ASPIRATION Right 1990's  . COLONOSCOPY Left 11/09/2017   Procedure: COLONOSCOPY;  Surgeon: Virgel Manifold, MD;  Location: [facility] ENDOSCOPY;  Service: Endoscopy;  Laterality: Left;  . CYSTO WITH HYDRODISTENSION N/A 08/15/2017   Procedure: CYSTOSCOPY/HYDRODISTENSION;  Surgeon: Nickie Retort, MD;  Location: [facility] ORS;  Service: Urology;  Laterality: N/A;  . ESOPHAGOGASTRODUODENOSCOPY Left 11/09/2017   Procedure: ESOPHAGOGASTRODUODENOSCOPY (EGD);  Surgeon: Virgel Manifold, MD;  Location: [facility]  ENDOSCOPY;  Service: Endoscopy;  Laterality: Left;  . FOOT SURGERY Right     Home Medications:  Allergies as of 04/29/2018      Reactions   Aspirin Other (See Comments)   Diverticulosis      Medication List        Accurate as of 04/29/18  8:28 PM. Always use your most recent med list.          bisoprolol-hydrochlorothiazide 10-6.25 MG tablet Commonly known as:  ZIAC Take 1 tablet by mouth daily.   Calcium-Vitamin D 600-400 MG-UNIT Tabs Take 1  tablet by mouth daily.   ferrous sulfate 325 (65 FE) MG tablet Commonly known as:  FERROUSUL One by mouth two or three days a week   fluticasone 50 MCG/ACT nasal spray Commonly known as:  FLONASE Place 2 sprays into both nostrils daily.   lisinopril 5 MG tablet Commonly known as:  PRINIVIL,ZESTRIL Take 1 tablet (5 mg total) by mouth daily. For BP and kidneys   lovastatin 20 MG tablet Commonly known as:  MEVACOR TAKE ONE TABLET BY MOUTH AT BEDTIME   Psyllium 51.7 % Pack Commonly known as:  METAMUCIL FIBER Take 1 Package by mouth daily.       Allergies:  Allergies  Allergen Reactions  . Aspirin Other (See Comments)    Diverticulosis    Family History: Family History  Problem Relation Age of Onset  . Hypertension Brother   . Heart disease Brother   . Heart failure Brother   . Hypertension Mother   . Stroke Mother   . Stroke Father   . Prostate cancer Father   . Aneurysm Sister   . Breast cancer Sister 70  . Liver disease Maternal Aunt   . Heart failure Paternal Aunt   . Heart disease Paternal Uncle   . Heart attack Brother   . Heart disease Brother   . Breast cancer Maternal Aunt   . Heart disease Paternal Aunt   . Kidney cancer Neg Hx   . Bladder Cancer Neg Hx     Social History:  reports that she has quit smoking. She has never used smokeless tobacco. She reports that she does not drink alcohol or use drugs.  ROS: UROLOGY Frequent Urination?: No Hard to postpone urination?: No Burning/pain with urination?: No Get up at night to urinate?: No Leakage of urine?: No Urine stream starts and stops?: No Trouble starting stream?: No Do you have to strain to urinate?: No Blood in urine?: No Urinary tract infection?: No Sexually transmitted disease?: No Injury to kidneys or bladder?: No Painful intercourse?: No Weak stream?: No Currently pregnant?: No Vaginal bleeding?: No Last menstrual period?: n  Gastrointestinal Nausea?: No Vomiting?:  No Indigestion/heartburn?: No Diarrhea?: No Constipation?: No  Constitutional Fever: No Night sweats?: No Weight loss?: No Fatigue?: No  Skin Skin rash/lesions?: No Itching?: No  Eyes Blurred vision?: No Double vision?: No  Ears/Nose/Throat Sore throat?: Yes Sinus problems?: Yes  Hematologic/Lymphatic Swollen glands?: No Easy bruising?: No  Cardiovascular Leg swelling?: No Chest pain?: No  Respiratory Cough?: Yes Shortness of breath?: No  Endocrine Excessive thirst?: No  Musculoskeletal Back pain?: No Joint pain?: No  Neurological Headaches?: No Dizziness?: No  Psychologic Depression?: No Anxiety?: No  Physical Exam: BP (!) 168/91 (BP Location: Right Arm, Patient Position: Sitting, Cuff Size: Large)   Pulse 62   Ht 5\' 6"  (1.676 m)   Wt 187 lb 12.8 oz (85.2 kg)   BMI 30.31 kg/m   Constitutional: Well  nourished. Alert and oriented, No acute distress. HEENT: [place] AT, moist mucus membranes. Trachea midline, no masses. Cardiovascular: No clubbing, cyanosis, or edema. Respiratory: Normal respiratory effort, no increased work of breathing. GI: Abdomen is soft, non tender, non distended, no abdominal masses. Liver and spleen not palpable.  No hernias appreciated.  Stool sample for occult testing is not indicated.   GU: No CVA tenderness.  No bladder fullness or masses.   Skin: No rashes, bruises or suspicious lesions. Lymph: No cervical or inguinal adenopathy. Neurologic: Grossly intact, no focal deficits, moving all 4 extremities. Psychiatric: Normal mood and affect.   Laboratory Data: Lab Results  Component Value Date   WBC 4.2 02/02/2018   HGB 11.4 (L) 02/02/2018   HCT 34.2 (L) 02/02/2018   MCV 85.7 02/02/2018   PLT 254 02/02/2018    Lab Results  Component Value Date   CREATININE 0.61 11/07/2017    No results found for: PSA  No results found for: TESTOSTERONE  Lab Results  Component Value Date   HGBA1C 5.7 (H) 12/08/2017     Urinalysis Negative. See epic.  Assessment & Plan:    1.  Chronic pelvic pain syndrome Patient asymptomatic at this time RTC prn  2. Right sided pain UA clear Patient advised to contact office if her symptoms start to occur more frequently or intensily    Return if symptoms worsen or fail to improve.  Zara Council, PA-C  [facility] [address] [place], [place] [address] [phone]

## 2018-04-29 ENCOUNTER — Encounter: Payer: Self-pay | Admitting: Urology

## 2018-04-29 ENCOUNTER — Ambulatory Visit (INDEPENDENT_AMBULATORY_CARE_PROVIDER_SITE_OTHER): Payer: Medicare Other | Admitting: Urology

## 2018-04-29 VITALS — BP 168/91 | HR 62 | Ht 66.0 in | Wt 187.8 lb

## 2018-04-29 DIAGNOSIS — R109 Unspecified abdominal pain: Secondary | ICD-10-CM

## 2018-04-29 DIAGNOSIS — R3989 Other symptoms and signs involving the genitourinary system: Secondary | ICD-10-CM

## 2018-04-29 LAB — URINALYSIS, COMPLETE
Bilirubin, UA: NEGATIVE
Glucose, UA: NEGATIVE
Ketones, UA: NEGATIVE
Leukocytes, UA: NEGATIVE
Nitrite, UA: NEGATIVE
Protein, UA: NEGATIVE
RBC, UA: NEGATIVE
Specific Gravity, UA: 1.01 (ref 1.005–1.030)
Urobilinogen, Ur: 0.2 mg/dL (ref 0.2–1.0)
pH, UA: 7 (ref 5.0–7.5)

## 2018-04-29 LAB — MICROSCOPIC EXAMINATION
RBC, UA: NONE SEEN /hpf (ref 0–2)
WBC, UA: NONE SEEN /hpf (ref 0–5)

## 2018-04-30 ENCOUNTER — Telehealth: Payer: Self-pay

## 2018-04-30 NOTE — Telephone Encounter (Signed)
-----   Message from Nori Riis, PA-C sent at 04/29/2018  4:45 PM EDT ----- Please let Mrs. Zollinger know that her urine was clear.

## 2018-04-30 NOTE — Telephone Encounter (Signed)
lmom informing pt of clear urine

## 2018-05-04 ENCOUNTER — Other Ambulatory Visit: Payer: Self-pay | Admitting: Family Medicine

## 2018-05-04 DIAGNOSIS — Z5181 Encounter for therapeutic drug level monitoring: Secondary | ICD-10-CM

## 2018-05-04 DIAGNOSIS — I10 Essential (primary) hypertension: Secondary | ICD-10-CM

## 2018-05-04 NOTE — Telephone Encounter (Signed)
Patent is overdue for CBC and iron studies; please ask her to have BMP done too (ordered); thank you

## 2018-05-04 NOTE — Telephone Encounter (Signed)
Pt.notified

## 2018-05-14 ENCOUNTER — Other Ambulatory Visit: Payer: Self-pay

## 2018-05-14 DIAGNOSIS — Z5181 Encounter for therapeutic drug level monitoring: Secondary | ICD-10-CM

## 2018-05-14 DIAGNOSIS — D508 Other iron deficiency anemias: Secondary | ICD-10-CM | POA: Diagnosis not present

## 2018-05-14 DIAGNOSIS — I10 Essential (primary) hypertension: Secondary | ICD-10-CM

## 2018-05-15 ENCOUNTER — Other Ambulatory Visit: Payer: Self-pay | Admitting: Family Medicine

## 2018-05-15 DIAGNOSIS — D500 Iron deficiency anemia secondary to blood loss (chronic): Secondary | ICD-10-CM

## 2018-05-15 LAB — CBC WITH DIFFERENTIAL/PLATELET
Basophils Absolute: 32 cells/uL (ref 0–200)
Basophils Relative: 0.6 %
Eosinophils Absolute: 130 cells/uL (ref 15–500)
Eosinophils Relative: 2.4 %
HCT: 34.9 % — ABNORMAL LOW (ref 35.0–45.0)
Hemoglobin: 11.8 g/dL (ref 11.7–15.5)
Lymphs Abs: 2138 cells/uL (ref 850–3900)
MCH: 29.9 pg (ref 27.0–33.0)
MCHC: 33.8 g/dL (ref 32.0–36.0)
MCV: 88.4 fL (ref 80.0–100.0)
MPV: 9.1 fL (ref 7.5–12.5)
Monocytes Relative: 10.5 %
Neutro Abs: 2533 cells/uL (ref 1500–7800)
Neutrophils Relative %: 46.9 %
Platelets: 188 10*3/uL (ref 140–400)
RBC: 3.95 10*6/uL (ref 3.80–5.10)
RDW: 12.6 % (ref 11.0–15.0)
Total Lymphocyte: 39.6 %
WBC mixed population: 567 cells/uL (ref 200–950)
WBC: 5.4 10*3/uL (ref 3.8–10.8)

## 2018-05-15 LAB — BASIC METABOLIC PANEL
BUN: 17 mg/dL (ref 7–25)
CO2: 30 mmol/L (ref 20–32)
Calcium: 9.3 mg/dL (ref 8.6–10.4)
Chloride: 102 mmol/L (ref 98–110)
Creat: 0.67 mg/dL (ref 0.60–0.93)
Glucose, Bld: 94 mg/dL (ref 65–99)
Potassium: 3.8 mmol/L (ref 3.5–5.3)
Sodium: 139 mmol/L (ref 135–146)

## 2018-05-15 LAB — IRON,TIBC AND FERRITIN PANEL
%SAT: 16 % (calc) (ref 16–45)
Ferritin: 36 ng/mL (ref 16–288)
Iron: 53 ug/dL (ref 45–160)
TIBC: 331 mcg/dL (calc) (ref 250–450)

## 2018-05-15 MED ORDER — FERROUS SULFATE 325 (65 FE) MG PO TABS: ORAL_TABLET | ORAL | 1 refills | Status: DC

## 2018-06-04 ENCOUNTER — Other Ambulatory Visit: Payer: Self-pay | Admitting: Family Medicine

## 2018-06-04 NOTE — Telephone Encounter (Signed)
Last Cr and K+ reviewed; Rx approved 

## 2018-06-15 ENCOUNTER — Encounter: Payer: Self-pay | Admitting: Gastroenterology

## 2018-06-15 ENCOUNTER — Ambulatory Visit (INDEPENDENT_AMBULATORY_CARE_PROVIDER_SITE_OTHER): Payer: Medicare Other | Admitting: Gastroenterology

## 2018-06-15 VITALS — BP 171/82 | HR 58 | Wt 188.0 lb

## 2018-06-15 DIAGNOSIS — K5731 Diverticulosis of large intestine without perforation or abscess with bleeding: Secondary | ICD-10-CM

## 2018-06-15 DIAGNOSIS — K219 Gastro-esophageal reflux disease without esophagitis: Secondary | ICD-10-CM

## 2018-06-15 MED ORDER — RANITIDINE HCL 75 MG PO TABS: 75.0000 mg | ORAL_TABLET | Freq: Two times a day (BID) | ORAL | 0 refills | Status: DC

## 2018-06-15 NOTE — Progress Notes (Signed)
[name], MD [address]  [place]  [place], [place] [address]  Main: [phone]  Fax: [phone]   Primary Care Physician: Arnetha Courser, MD  Primary Gastroenterologist:  Dr. [name]  Chief Complaint  Patient presents with  . Follow-up    6 month f/u diverticular bleeding    HPI: Holly Green is a 72 y.o. female here for follow-up for diverticular bleeding.  No further episodes of GI bleeding since December 2018 hospital discharge.  Reports 1 soft problems daily, without melena or hematochezia.  Consuming a high-fiber diet and taking MiraLAX daily.  Was evaluated by surgery due to history of diverticular bleeding on more than one episode, no surgical intervention recommended.  See scanned report under media by [place] surgery. The patient denies abdominal or flank pain, anorexia, nausea or vomiting, dysphagia, change in bowel habits or black or bloody stools or weight loss.  On review of systems, patient reports daily cough with phlegm in the morning.  States was given antibiotics for this and that has not helped.  Also reports a sour taste in the mouth before the cough starts.  Symptoms are not more prominent during the night or in the morning, and occurred throughout the day.  Denies any heartburn.  Previous history: "She underwent an EGD that was normal on that admission, and a colonoscopy that showed diffuse diverticulosis, and the cecum, ascending colon, descending colon and sigmoid colon.  Majority of the diverticulosis was in the left colon.  None of these were actively bleeding and no intervention was needed during the colonoscopy.    Repeat recommended in 5 years.  She has history of diverticular bleeds in the past.  In 2015 she had similar symptoms and and colonoscopy was planned but due to significant bleeding, RBC scan was done and was positive for hemorrhage in the right colon likely related to bleeding diverticulum.  Patient underwent  coil embolization at that time.  Prior to this in 2009 she also had a positive RBC scan in the mid transverse and descending colon consistent with GI bleed.  In 2012 patient had an upper endoscopy which showed hiatal hernia, erosive gastropathy, colonoscopy was done for a lot of abdominal pain at the time and showed solitary ulcer in the descending colon."     Current Outpatient Medications  Medication Sig Dispense Refill  . bisoprolol-hydrochlorothiazide (ZIAC) 10-6.25 MG tablet TAKE 1 TABLET BY MOUTH ONCE DAILY 90 tablet 1  . Calcium Carb-Cholecalciferol (CALCIUM-VITAMIN D) 600-400 MG-UNIT TABS Take 1 tablet by mouth daily.    . ferrous sulfate (FERROUSUL) 325 (65 FE) MG tablet One by mouth two or three days a week 12 tablet 1  . lisinopril (PRINIVIL,ZESTRIL) 5 MG tablet TAKE 1 TABLET BY MOUTH ONCE DAILY FOR BLOOD PRESSURE AND KIDNEYS 90 tablet 1  . lovastatin (MEVACOR) 20 MG tablet TAKE ONE TABLET BY MOUTH AT BEDTIME 90 tablet 3  . polyethylene glycol (MIRALAX / GLYCOLAX) packet Take 17 g by mouth daily.    . ranitidine (ZANTAC 75) 75 MG tablet Take 1 tablet (75 mg total) by mouth 2 (two) times daily for 14 days. 28 tablet 0   No current facility-administered medications for this visit.     Allergies as of 06/15/2018 - Review Complete 06/15/2018  Allergen Reaction Noted  . Aspirin Other (See Comments) 08/08/2017    ROS:  General: Negative for anorexia, weight loss, fever, chills, fatigue, weakness. ENT: Negative for hoarseness, difficulty swallowing , nasal congestion. CV: Negative  for chest pain, angina, palpitations, dyspnea on exertion, peripheral edema.  Respiratory: Negative for dyspnea at rest, dyspnea on exertion, cough, sputum, wheezing.  GI: See history of present illness. GU:  Negative for dysuria, hematuria, urinary incontinence, urinary frequency, nocturnal urination.  Endo: Negative for unusual weight change.    Physical Examination:   BP (!) 171/82 Comment: left  arm  Pulse (!) 58   Wt 188 lb (85.3 kg)   BMI 30.34 kg/m   General: Well-nourished, well-developed in no acute distress.  Eyes: No icterus. Conjunctivae pink. Mouth: Oropharyngeal mucosa moist and pink , no lesions erythema or exudate. Neck: Supple, Trachea midline Abdomen: Bowel sounds are normal, nontender, nondistended, no hepatosplenomegaly or masses, no abdominal bruits or hernia , no rebound or guarding.   Extremities: No lower extremity edema. No clubbing or deformities. Neuro: Alert and oriented x 3.  Grossly intact. Skin: Warm and dry, no jaundice.   Psych: Alert and cooperative, normal mood and affect.   Labs: CMP     Component Value Date/Time   NA 139 05/14/2018 0845   NA 140 05/07/2016 0930   NA 141 01/16/2014 0514   K 3.8 05/14/2018 0845   K 3.5 01/16/2014 0514   CL 102 05/14/2018 0845   CL 108 (H) 01/16/2014 0514   CO2 30 05/14/2018 0845   CO2 28 01/16/2014 0514   GLUCOSE 94 05/14/2018 0845   GLUCOSE 104 (H) 01/16/2014 0514   BUN 17 05/14/2018 0845   BUN 20 05/07/2016 0930   BUN 2 (L) 01/16/2014 0514   CREATININE 0.67 05/14/2018 0845   CALCIUM 9.3 05/14/2018 0845   CALCIUM 7.6 (L) 01/16/2014 0514   PROT 7.0 11/06/2017 0816   PROT 6.9 05/07/2016 0930   PROT 6.2 (L) 01/11/2014 1121   ALBUMIN 3.6 11/06/2017 0816   ALBUMIN 4.1 05/07/2016 0930   ALBUMIN 3.0 (L) 01/11/2014 1121   AST 25 11/06/2017 0816   AST 24 01/11/2014 1121   ALT 17 11/06/2017 0816   ALT 16 01/11/2014 1121   ALKPHOS 87 11/06/2017 0816   ALKPHOS 69 01/11/2014 1121   BILITOT 1.2 11/06/2017 0816   BILITOT 0.8 05/07/2016 0930   BILITOT 0.4 01/11/2014 1121   GFRNONAA >60 11/07/2017 0257   GFRNONAA 89 06/06/2017 0938   GFRAA >60 11/07/2017 0257   GFRAA >89 06/06/2017 0938   Lab Results  Component Value Date   WBC 5.4 05/14/2018   HGB 11.8 05/14/2018   HCT 34.9 (L) 05/14/2018   MCV 88.4 05/14/2018   PLT 188 05/14/2018    Imaging Studies: No results found.  Assessment and Plan:    Holly Green is a 72 y.o. y/o female here for follow-up of diverticular bleeding that occurred in December 2018 and she underwent EGD and colonoscopy at the time with no further episodes of diverticular bleeding  No further episodes of GI bleeding Continue high-fiber diet Continue MiraLAX daily  Patient is reporting cough with sour taste in mouth No heartburn States was given antibiotics and cough did not resolve It could be related to acid reflux given that she has a sour taste in mouth before the cough starts We will try Zantac twice daily for 14 days, and patient was asked to call us after 14 days to tell us that the cough has improved.  If it has, then it may be related to acid reflux, and we can change medications accordingly after 2 weeks.  She verbalized understanding.  No alarm symptoms or indication for repeat EGD  at this time.  Patient educated extensively on acid reflux lifestyle modification, including buying a bed wedge, not eating 3 hrs before bedtime, diet modifications, and handout given for the same.    Dr [name]

## 2018-06-15 NOTE — Patient Instructions (Signed)
Pt informed to contact office in 2 weeks to let us know how the Zantac worked. F/U 3 months

## 2018-06-18 ENCOUNTER — Ambulatory Visit
Admission: RE | Admit: 2018-06-18 | Discharge: 2018-06-18 | Disposition: A | Discharge status: Home / Self Care | Payer: Medicare Other | Source: Ambulatory Visit | Attending: Family Medicine | Admitting: Family Medicine

## 2018-06-18 ENCOUNTER — Ambulatory Visit (INDEPENDENT_AMBULATORY_CARE_PROVIDER_SITE_OTHER): Payer: Medicare Other | Admitting: Family Medicine

## 2018-06-18 ENCOUNTER — Encounter: Payer: Self-pay | Admitting: Family Medicine

## 2018-06-18 VITALS — BP 132/74 | HR 68 | Temp 98.3°F | Resp 14 | Ht 66.0 in | Wt 184.9 lb

## 2018-06-18 DIAGNOSIS — D508 Other iron deficiency anemias: Secondary | ICD-10-CM | POA: Diagnosis not present

## 2018-06-18 DIAGNOSIS — M13869 Other specified arthritis, unspecified knee: Secondary | ICD-10-CM | POA: Insufficient documentation

## 2018-06-18 DIAGNOSIS — I10 Essential (primary) hypertension: Secondary | ICD-10-CM | POA: Diagnosis not present

## 2018-06-18 DIAGNOSIS — R918 Other nonspecific abnormal finding of lung field: Secondary | ICD-10-CM | POA: Diagnosis not present

## 2018-06-18 DIAGNOSIS — R053 Chronic cough: Secondary | ICD-10-CM

## 2018-06-18 DIAGNOSIS — E663 Overweight: Secondary | ICD-10-CM | POA: Diagnosis not present

## 2018-06-18 DIAGNOSIS — M1711 Unilateral primary osteoarthritis, right knee: Secondary | ICD-10-CM | POA: Diagnosis not present

## 2018-06-18 DIAGNOSIS — M810 Age-related osteoporosis without current pathological fracture: Secondary | ICD-10-CM

## 2018-06-18 DIAGNOSIS — E782 Mixed hyperlipidemia: Secondary | ICD-10-CM

## 2018-06-18 DIAGNOSIS — R05 Cough: Secondary | ICD-10-CM

## 2018-06-18 DIAGNOSIS — M11262 Other chondrocalcinosis, left knee: Secondary | ICD-10-CM | POA: Insufficient documentation

## 2018-06-18 DIAGNOSIS — M1712 Unilateral primary osteoarthritis, left knee: Secondary | ICD-10-CM | POA: Diagnosis not present

## 2018-06-18 DIAGNOSIS — R7303 Prediabetes: Secondary | ICD-10-CM

## 2018-06-18 DIAGNOSIS — K219 Gastro-esophageal reflux disease without esophagitis: Secondary | ICD-10-CM | POA: Diagnosis not present

## 2018-06-18 HISTORY — DX: Other chondrocalcinosis, left knee: M11.262

## 2018-06-18 MED ORDER — AMLODIPINE BESYLATE 5 MG PO TABS: 5.0000 mg | ORAL_TABLET | Freq: Every day | ORAL | 0 refills | Status: DC

## 2018-06-18 NOTE — Assessment & Plan Note (Signed)
Stop ACE-I because of chronic cough (may be the cause); start amlodipine; try DASH guidelines; close f/u in 3 weeks

## 2018-06-18 NOTE — Assessment & Plan Note (Signed)
Check glucose and A1c in August

## 2018-06-18 NOTE — Assessment & Plan Note (Signed)
Check cholesterol in a few weeks (either at her visit or a day or two before to have results to discuss)

## 2018-06-18 NOTE — Assessment & Plan Note (Signed)
Improving; check labs in August; continue iron for now

## 2018-06-18 NOTE — Patient Instructions (Addendum)
Stop the lisinopril completely Go across the street for a chest xray Someone should contact you about getting breathing tests done through the hospital If you have not heard anything from my staff in a week about any orders/referrals/studies from today, please contact us here to follow-up [phone] Start amlodipine 5 mg daily Let's recheck your labs in August at your next visit Please do call to schedule your bone density study; the number to schedule one at either [facility] or [facility] is [phone] or [phone] If the cough continues, let's get a chest CT and we'll discuss at your visit in August  Try to follow the DASH guidelines (DASH stands for Dietary Approaches to Stop Hypertension). Try to limit the sodium in your diet to no more than 1,500mg  of sodium per day. Certainly try to not exceed 2,000 mg per day at the very most. Do not add salt when cooking or at the table.  Check the sodium amount on labels when shopping, and choose items lower in sodium when given a choice. Avoid or limit foods that already contain a lot of sodium. Eat a diet rich in fruits and vegetables and whole grains, and try to lose weight if overweight or obese   DASH Eating Plan DASH stands for "Dietary Approaches to Stop Hypertension." The DASH eating plan is a healthy eating plan that has been shown to reduce high blood pressure (hypertension). It may also reduce your risk for type 2 diabetes, heart disease, and stroke. The DASH eating plan may also help with weight loss. What are tips for following this plan? General guidelines  Avoid eating more than 2,300 mg (milligrams) of salt (sodium) a day. If you have hypertension, you may need to reduce your sodium intake to 1,500 mg a day.  Limit alcohol intake to no more than 1 drink a day for nonpregnant women and 2 drinks a day for men. One drink equals 12 oz of beer, 5 oz of wine, or 1 oz of hard liquor.  Work with your  health care provider to maintain a healthy body weight or to lose weight. Ask what an ideal weight is for you.  Get at least 30 minutes of exercise that causes your heart to beat faster (aerobic exercise) most days of the week. Activities may include walking, swimming, or biking.  Work with your health care provider or diet and nutrition specialist (dietitian) to adjust your eating plan to your individual calorie needs. Reading food labels  Check food labels for the amount of sodium per serving. Choose foods with less than 5 percent of the Daily Value of sodium. Generally, foods with less than 300 mg of sodium per serving fit into this eating plan.  To find whole grains, look for the word "whole" as the first word in the ingredient list. Shopping  Buy products labeled as "low-sodium" or "no salt added."  Buy fresh foods. Avoid canned foods and premade or frozen meals. Cooking  Avoid adding salt when cooking. Use salt-free seasonings or herbs instead of table salt or sea salt. Check with your health care provider or pharmacist before using salt substitutes.  Do not fry foods. Cook foods using healthy methods such as baking, boiling, grilling, and broiling instead.  Cook with heart-healthy oils, such as olive, canola, soybean, or sunflower oil. Meal planning   Eat a balanced diet that includes: ? 5 or more servings of fruits and vegetables each day. At each meal, try to fill  half of your plate with fruits and vegetables. ? Up to 6-8 servings of whole grains each day. ? Less than 6 oz of lean meat, poultry, or fish each day. A 3-oz serving of meat is about the same size as a deck of cards. One egg equals 1 oz. ? 2 servings of low-fat dairy each day. ? A serving of nuts, seeds, or beans 5 times each week. ? Heart-healthy fats. Healthy fats called Omega-3 fatty acids are found in foods such as flaxseeds and coldwater fish, like sardines, salmon, and mackerel.  Limit how much you eat of  the following: ? Canned or prepackaged foods. ? Food that is high in trans fat, such as fried foods. ? Food that is high in saturated fat, such as fatty meat. ? Sweets, desserts, sugary drinks, and other foods with added sugar. ? Full-fat dairy products.  Do not salt foods before eating.  Try to eat at least 2 vegetarian meals each week.  Eat more home-cooked food and less restaurant, buffet, and fast food.  When eating at a restaurant, ask that your food be prepared with less salt or no salt, if possible. What foods are recommended? The items listed may not be a complete list. Talk with your dietitian about what dietary choices are best for you. Grains Whole-grain or whole-wheat bread. Whole-grain or whole-wheat pasta. Brown rice. [place]. Bulgur. Whole-grain and low-sodium cereals. Pita bread. Low-fat, low-sodium crackers. Whole-wheat flour tortillas. Vegetables Fresh or frozen vegetables (raw, steamed, roasted, or grilled). Low-sodium or reduced-sodium tomato and vegetable juice. Low-sodium or reduced-sodium tomato sauce and tomato paste. Low-sodium or reduced-sodium canned vegetables. Fruits All fresh, dried, or frozen fruit. Canned fruit in natural juice (without added sugar). Meat and other protein foods Skinless chicken or [place]. Ground chicken or [place]. Pork with fat trimmed off. Fish and seafood. Egg whites. Dried beans, peas, or lentils. Unsalted nuts, nut butters, and seeds. Unsalted canned beans. Lean cuts of beef with fat trimmed off. Low-sodium, lean deli meat. Dairy Low-fat (1%) or fat-free (skim) milk. Fat-free, low-fat, or reduced-fat cheeses. Nonfat, low-sodium ricotta or cottage cheese. Low-fat or nonfat yogurt. Low-fat, low-sodium cheese. Fats and oils Soft margarine without trans fats. Vegetable oil. Low-fat, reduced-fat, or light mayonnaise and salad dressings (reduced-sodium). Canola, safflower, olive, soybean, and sunflower oils. Avocado. Seasoning and  other foods Herbs. Spices. Seasoning mixes without salt. Unsalted popcorn and pretzels. Fat-free sweets. What foods are not recommended? The items listed may not be a complete list. Talk with your dietitian about what dietary choices are best for you. Grains Baked goods made with fat, such as croissants, muffins, or some breads. Dry pasta or rice meal packs. Vegetables Creamed or fried vegetables. Vegetables in a cheese sauce. Regular canned vegetables (not low-sodium or reduced-sodium). Regular canned tomato sauce and paste (not low-sodium or reduced-sodium). Regular tomato and vegetable juice (not low-sodium or reduced-sodium). Angie Fava. Olives. Fruits Canned fruit in a light or heavy syrup. Fried fruit. Fruit in cream or butter sauce. Meat and other protein foods Fatty cuts of meat. Ribs. Fried meat. Berniece Salines. Sausage. Bologna and other processed lunch meats. Salami. Fatback. Hotdogs. Bratwurst. Salted nuts and seeds. Canned beans with added salt. Canned or smoked fish. Whole eggs or egg yolks. Chicken or [place] with skin. Dairy Whole or 2% milk, cream, and half-and-half. Whole or full-fat cream cheese. Whole-fat or sweetened yogurt. Full-fat cheese. Nondairy creamers. Whipped toppings. Processed cheese and cheese spreads. Fats and oils Butter. Stick margarine. Lard. Shortening. Ghee. Bacon fat. Tropical  oils, such as coconut, palm kernel, or palm oil. Seasoning and other foods Salted popcorn and pretzels. Onion salt, garlic salt, seasoned salt, table salt, and sea salt. Worcestershire sauce. Tartar sauce. Barbecue sauce. Teriyaki sauce. Soy sauce, including reduced-sodium. Steak sauce. Canned and packaged gravies. Fish sauce. Oyster sauce. Cocktail sauce. Horseradish that you find on the shelf. Ketchup. Mustard. Meat flavorings and tenderizers. Bouillon cubes. Hot sauce and Tabasco sauce. Premade or packaged marinades. Premade or packaged taco seasonings. Relishes. Regular salad dressings. Where to  find more information:  National Heart, Lung, and [place]: https://wilson-eaton.com/  American Heart Association: www.heart.org Summary  The DASH eating plan is a healthy eating plan that has been shown to reduce high blood pressure (hypertension). It may also reduce your risk for type 2 diabetes, heart disease, and stroke.  With the DASH eating plan, you should limit salt (sodium) intake to 2,300 mg a day. If you have hypertension, you may need to reduce your sodium intake to 1,500 mg a day.  When on the DASH eating plan, aim to eat more fresh fruits and vegetables, whole grains, lean proteins, low-fat dairy, and heart-healthy fats.  Work with your health care provider or diet and nutrition specialist (dietitian) to adjust your eating plan to your individual calorie needs. This information is not intended to replace advice given to you by your health care provider. Make sure you discuss any questions you have with your health care provider. Document Released: 10/31/2011 Document Revised: 11/04/2016 Document Reviewed: 11/04/2016 Elsevier Interactive Patient Education  2018 [place] in the Home Falls can cause injuries. They can happen to people of all ages. There are many things you can do to make your home safe and to help prevent falls. What can I do on the outside of my home?  Regularly fix the edges of walkways and driveways and fix any cracks.  Remove anything that might make you trip as you walk through a door, such as a raised step or threshold.  Trim any bushes or trees on the path to your home.  Use bright outdoor lighting.  Clear any walking paths of anything that might make someone trip, such as rocks or tools.  Regularly check to see if handrails are loose or broken. Make sure that both sides of any steps have handrails.  Any raised decks and porches should have guardrails on the edges.  Have any leaves, snow, or ice cleared regularly.  Use  sand or salt on walking paths during winter.  Clean up any spills in your garage right away. This includes oil or grease spills. What can I do in the bathroom?  Use night lights.  Install grab bars by the toilet and in the tub and shower. Do not use towel bars as grab bars.  Use non-skid mats or decals in the tub or shower.  If you need to sit down in the shower, use a plastic, non-slip stool.  Keep the floor dry. Clean up any water that spills on the floor as soon as it happens.  Remove soap buildup in the tub or shower regularly.  Attach bath mats securely with double-sided non-slip rug tape.  Do not have throw rugs and other things on the floor that can make you trip. What can I do in the bedroom?  Use night lights.  Make sure that you have a light by your bed that is easy to reach.  Do not use any sheets or blankets that are  too big for your bed. They should not hang down onto the floor.  Have a firm chair that has side arms. You can use this for support while you get dressed.  Do not have throw rugs and other things on the floor that can make you trip. What can I do in the kitchen?  Clean up any spills right away.  Avoid walking on wet floors.  Keep items that you use a lot in easy-to-reach places.  If you need to reach something above you, use a strong step stool that has a grab bar.  Keep electrical cords out of the way.  Do not use floor polish or wax that makes floors slippery. If you must use wax, use non-skid floor wax.  Do not have throw rugs and other things on the floor that can make you trip. What can I do with my stairs?  Do not leave any items on the stairs.  Make sure that there are handrails on both sides of the stairs and use them. Fix handrails that are broken or loose. Make sure that handrails are as long as the stairways.  Check any carpeting to make sure that it is firmly attached to the stairs. Fix any carpet that is loose or worn.  Avoid  having throw rugs at the top or bottom of the stairs. If you do have throw rugs, attach them to the floor with carpet tape.  Make sure that you have a light switch at the top of the stairs and the bottom of the stairs. If you do not have them, ask someone to add them for you. What else can I do to help prevent falls?  Wear shoes that: ? Do not have high heels. ? Have rubber bottoms. ? Are comfortable and fit you well. ? Are closed at the toe. Do not wear sandals.  If you use a stepladder: ? Make sure that it is fully opened. Do not climb a closed stepladder. ? Make sure that both sides of the stepladder are locked into place. ? Ask someone to hold it for you, if possible.  Clearly mark and make sure that you can see: ? Any grab bars or handrails. ? First and last steps. ? Where the edge of each step is.  Use tools that help you move around (mobility aids) if they are needed. These include: ? Canes. ? Walkers. ? Scooters. ? Crutches.  Turn on the lights when you go into a dark area. Replace any light bulbs as soon as they burn out.  Set up your furniture so you have a clear path. Avoid moving your furniture around.  If any of your floors are uneven, fix them.  If there are any pets around you, be aware of where they are.  Review your medicines with your doctor. Some medicines can make you feel dizzy. This can increase your chance of falling. Ask your doctor what other things that you can do to help prevent falls. This information is not intended to replace advice given to you by your health care provider. Make sure you discuss any questions you have with your health care provider. Document Released: 09/07/2009 Document Revised: 04/18/2016 Document Reviewed: 12/16/2014 Elsevier Interactive Patient Education  [employer].

## 2018-06-18 NOTE — Progress Notes (Signed)
BP 132/74   Pulse 68   Temp 98.3 F (36.8 C) (Oral)   Resp 14   Ht 5\' 6"  (1.676 m)   Wt 184 lb 14.4 oz (83.9 kg)   SpO2 98%   BMI 29.84 kg/m    Subjective:    Patient ID: Holly Green, female    DOB: February 02, 1946, 72 y.o.   MRN: [id number]  HPI: Holly Green is a 72 y.o. female  Chief Complaint  Patient presents with  . Follow-up  . Cough    chronic    HPI Patient is here for follow-up  She has high blood pressure; on bisoprolol and HCTZ and ACE-I; she is staying away from salt somewhat; she does check her BP at home; numbers usually high, 180 something  She has high cholesterol; on statin; some fatty foods, not every day Lab Results  Component Value Date   CHOL 161 12/08/2017   HDL 91 12/08/2017   LDLCALC 59 12/08/2017   TRIG 40 12/08/2017   CHOLHDL 1.8 12/08/2017    Chronic cough; coughing up phlegm; took medicine and did not get better; she loses her voice; so much phlegm, throws it up, no food in it; no night sweats; no fevers, just hot flashes; no unexplained weight loss; phlegm tastes kind of salty; she smoked for a short period of time, about a year when younger, hanging with the other kids; she did not grow up with smokers in the home around her; nephew smokes outside  Prediabetes; last A1c 5.7 in January; diabetes in the family: brother, father (but his was related to tube feedings); last glucose in June was normal  She saw her GI doctor on Monday; GI doctor told her to try Zantac  Anemia; H/H improving; energy is pretty good, just tired from age and working; working better than the young people  Osteoporosis; last DEXA was July 2016; T-score -3.2; taking calcium with vitamin D; she used to take something once a week from her last doctor, took it for a couple of years  Knee arthritis; they are swollen; she has arthritis in her back too; had surgery on her back for a disc problem;   Depression screen [facility] 2/9 06/18/2018 11/17/2017 06/20/2017 06/06/2017  03/07/2017  Decreased Interest 0 0 0 0 0  Down, Depressed, Hopeless 0 0 0 0 0  PHQ - 2 Score 0 0 0 0 0    Relevant past medical, surgical, family and social history reviewed Past Medical History:  Diagnosis Date  . Abdominal aortic atherosclerosis ([place]) 03/05/2017   CT scan April 2018  . Arthritis   . Bleeding disorder ([place])   . Diverticulitis 2015  . GERD (gastroesophageal reflux disease)   . History of kidney stones   . History of stomach ulcers   . Hyperlipidemia   . Hypertension   . Osteoporosis   . Pre-diabetes   . Prediabetes 10/15/2015  . Spondylolisthesis at L4-L5 level 03/07/2017   Past Surgical History:  Procedure Laterality Date  . ABDOMINAL HYSTERECTOMY    . BACK SURGERY  2010  . BREAST CYST ASPIRATION Right 1990's  . COLONOSCOPY Left 11/09/2017   Procedure: COLONOSCOPY;  Surgeon: Virgel Manifold, MD;  Location: [facility] ENDOSCOPY;  Service: Endoscopy;  Laterality: Left;  . CYSTO WITH HYDRODISTENSION N/A 08/15/2017   Procedure: CYSTOSCOPY/HYDRODISTENSION;  Surgeon: Nickie Retort, MD;  Location: [facility] ORS;  Service: Urology;  Laterality: N/A;  . ESOPHAGOGASTRODUODENOSCOPY Left 11/09/2017   Procedure: ESOPHAGOGASTRODUODENOSCOPY (EGD);  Surgeon: [name]  B, MD;  Location: [facility] ENDOSCOPY;  Service: Endoscopy;  Laterality: Left;  . FOOT SURGERY Right    Family History  Problem Relation Age of Onset  . Hypertension Brother   . Heart disease Brother   . Heart failure Brother   . Hypertension Mother   . Stroke Mother   . Stroke Father   . Prostate cancer Father   . Aneurysm Sister   . Breast cancer Sister 69  . Liver disease Maternal Aunt   . Heart failure Paternal Aunt   . Heart disease Paternal Uncle   . Heart attack Brother   . Heart disease Brother   . Breast cancer Maternal Aunt   . Heart disease Paternal Aunt   . Kidney cancer Neg Hx   . Bladder Cancer Neg Hx    Social History   Tobacco Use  . Smoking status: Former Research scientist (life sciences)  .  Smokeless tobacco: Never Used  . Tobacco comment: teenage years about 1 year  Substance Use Topics  . Alcohol use: No    Alcohol/week: 0.0 oz  . Drug use: No    Interim medical history since last visit reviewed. Allergies and medications reviewed  Review of Systems Per HPI unless specifically indicated above     Objective:    BP 132/74   Pulse 68   Temp 98.3 F (36.8 C) (Oral)   Resp 14   Ht 5\' 6"  (1.676 m)   Wt 184 lb 14.4 oz (83.9 kg)   SpO2 98%   BMI 29.84 kg/m   Wt Readings from Last 3 Encounters:  06/18/18 184 lb 14.4 oz (83.9 kg)  06/15/18 188 lb (85.3 kg)  04/29/18 187 lb 12.8 oz (85.2 kg)    Physical Exam  Constitutional: She appears well-developed and well-nourished. No distress.  HENT:  Head: Normocephalic and atraumatic.  Eyes: EOM are normal. No scleral icterus.  Neck: No thyromegaly present.  Cardiovascular: Normal rate, regular rhythm and normal heart sounds.  No murmur heard. Pulmonary/Chest: Effort normal and breath sounds normal. No respiratory distress. She has no wheezes.  Abdominal: Soft. Bowel sounds are normal. She exhibits no distension.  Musculoskeletal: Normal range of motion. She exhibits no edema.  Neurological: She is alert. She exhibits normal muscle tone.  Skin: Skin is warm and dry. She is not diaphoretic. No pallor.  Psychiatric: She has a normal mood and affect. Her behavior is normal. Judgment and thought content normal.    Results for orders placed or performed in visit on 09/73/53  Basic metabolic panel  Result Value Ref Range   Glucose, Bld 94 65 - 99 mg/dL   BUN 17 7 - 25 mg/dL   Creat 0.67 0.60 - 0.93 mg/dL   BUN/Creatinine Ratio NOT APPLICABLE 6 - 22 (calc)   Sodium 139 135 - 146 mmol/L   Potassium 3.8 3.5 - 5.3 mmol/L   Chloride 102 98 - 110 mmol/L   CO2 30 20 - 32 mmol/L   Calcium 9.3 8.6 - 10.4 mg/dL  Iron, TIBC and Ferritin Panel  Result Value Ref Range   Iron 53 45 - 160 mcg/dL   TIBC 331 250 - 450 mcg/dL  (calc)   %SAT 16 16 - 45 % (calc)   Ferritin 36 16 - 288 ng/mL  CBC with Differential/Platelet  Result Value Ref Range   WBC 5.4 3.8 - 10.8 Thousand/uL   RBC 3.95 3.80 - 5.10 Million/uL   Hemoglobin 11.8 11.7 - 15.5 g/dL   HCT 34.9 (L) 35.0 -  45.0 %   MCV 88.4 80.0 - 100.0 fL   MCH 29.9 27.0 - 33.0 pg   MCHC 33.8 32.0 - 36.0 g/dL   RDW 12.6 11.0 - 15.0 %   Platelets 188 140 - 400 Thousand/uL   MPV 9.1 7.5 - 12.5 fL   Neutro Abs 2,533 1,500 - 7,800 cells/uL   Lymphs Abs 2,138 850 - 3,900 cells/uL   WBC mixed population 567 200 - 950 cells/uL   Eosinophils Absolute 130 15 - 500 cells/uL   Basophils Absolute 32 0 - 200 cells/uL   Neutrophils Relative % 46.9 %   Total Lymphocyte 39.6 %   Monocytes Relative 10.5 %   Eosinophils Relative 2.4 %   Basophils Relative 0.6 %      Assessment & Plan:   Problem List Items Addressed This Visit      Cardiovascular and Mediastinum   Essential hypertension (Chronic)    Stop ACE-I because of chronic cough (may be the cause); start amlodipine; try DASH guidelines; close f/u in 3 weeks      Relevant Medications   amLODipine (NORVASC) 5 MG tablet     Digestive   GERD (gastroesophageal reflux disease) (Chronic)    Seeing GI specialist        Musculoskeletal and Integument   Osteoporosis (Chronic)    Encouraged patient to get the DEXA done; continue calcium with vitamin D; fall precautions encouraged        Other   Prediabetes    Check glucose and A1c in August      Overweight (BMI 25.0-29.9)    Keep working on weight      Hyperlipidemia (Chronic)    Check cholesterol in a few weeks (either at her visit or a day or two before to have results to discuss)      Relevant Medications   amLODipine (NORVASC) 5 MG tablet   Anemia    Improving; check labs in August; continue iron for now       Other Visit Diagnoses    Chronic cough    -  Primary   Relevant Orders   DG Chest 2 View   Pulmonary Function Test [facility] Only        Follow up plan: Return in about 3 weeks (around 07/09/2018) for follow-up visit with Dr. Sanda Klein.  An after-visit summary was printed and given to the patient at [facility].  Please see the patient instructions which may contain other information and recommendations beyond what is mentioned above in the assessment and plan.  Meds ordered this encounter  Medications  . amLODipine (NORVASC) 5 MG tablet    Sig: Take 1 tablet (5 mg total) by mouth daily.    Dispense:  30 tablet    Refill:  0    Orders Placed This Encounter  Procedures  . DG Chest 2 View  . Pulmonary Function Test [facility] Only

## 2018-06-18 NOTE — Assessment & Plan Note (Signed)
Keep working on [employer]

## 2018-06-18 NOTE — Assessment & Plan Note (Signed)
Encouraged patient to get the DEXA done; continue calcium with vitamin D; fall precautions encouraged

## 2018-06-18 NOTE — Assessment & Plan Note (Signed)
Seeing GI specialist

## 2018-06-19 ENCOUNTER — Telehealth: Payer: Self-pay | Admitting: Gastroenterology

## 2018-06-19 ENCOUNTER — Other Ambulatory Visit: Payer: Self-pay

## 2018-06-19 ENCOUNTER — Telehealth: Payer: Self-pay

## 2018-06-19 DIAGNOSIS — R05 Cough: Secondary | ICD-10-CM

## 2018-06-19 DIAGNOSIS — M11262 Other chondrocalcinosis, left knee: Secondary | ICD-10-CM

## 2018-06-19 DIAGNOSIS — R053 Chronic cough: Secondary | ICD-10-CM

## 2018-06-19 NOTE — Telephone Encounter (Signed)
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 I think she would benefit from seeing a rheumatologist for evaluation and possible injection of steroids into the knee. Please REFER to rheum if she agrees, dx: chondrocalcinosis LEFT knee Code: O35.009, I entered it into problem list. Thank you

## 2018-06-19 NOTE — Telephone Encounter (Signed)
Pt left  vm she was told to call Dr. Bonna Gains and tell her how she was doing on the rx Zantec

## 2018-06-22 NOTE — Telephone Encounter (Signed)
Pt called back and pt was informed of the info below that was noted on Friday; pt is also wondering about another test that should be done possibly that should be done at the hospital, contact pt to advise

## 2018-06-22 NOTE — Telephone Encounter (Signed)
Pt notified a referral was enetered

## 2018-06-25 NOTE — Telephone Encounter (Signed)
Pt states when she started taking the Zantac she got a cough and it seemed to make her reflux better. She has seen Dr. Sanda Klein and pt states she is seeing pulmonary. To f/u with Dr. Bonna Gains in Oct. But will contact office if we can help her before then.

## 2018-06-25 NOTE — Telephone Encounter (Signed)
As per below, Zantac is helped with her cough and reflux.

## 2018-07-01 ENCOUNTER — Telehealth: Payer: Self-pay

## 2018-07-01 MED ORDER — LOSARTAN POTASSIUM 25 MG PO TABS: 25.0000 mg | ORAL_TABLET | Freq: Every day | ORAL | 0 refills | Status: DC

## 2018-07-01 NOTE — Telephone Encounter (Signed)
Copied from [place] [id number]. Topic: Quick Communication - See Telephone Encounter >> Jul 01, 2018 11:53 AM Antonieta Iba C wrote: CRM for notification. See Telephone encounter for: 07/01/18.  Pt says that she has been having headaches since she started her new BP medication and would like to be advised by provider. Should medication be changed?    Please advise.

## 2018-07-01 NOTE — Telephone Encounter (Signed)
Stop the amlodipine Start losartan 25 mg daily See me or NP in the next few days

## 2018-07-01 NOTE — Telephone Encounter (Signed)
Sent info to referral center

## 2018-07-01 NOTE — Telephone Encounter (Signed)
Pt called in to make provider aware that she has not received a call in regards to referral for scheduling.   Please assist further

## 2018-07-01 NOTE — Telephone Encounter (Signed)
PT notified appt scheduled for monday

## 2018-07-06 ENCOUNTER — Encounter: Payer: Self-pay | Admitting: Nurse Practitioner

## 2018-07-06 ENCOUNTER — Ambulatory Visit (INDEPENDENT_AMBULATORY_CARE_PROVIDER_SITE_OTHER): Payer: Medicare Other | Admitting: Nurse Practitioner

## 2018-07-06 VITALS — BP 142/78 | HR 65 | Temp 98.3°F | Resp 16 | Ht 66.0 in | Wt 186.3 lb

## 2018-07-06 DIAGNOSIS — R0981 Nasal congestion: Secondary | ICD-10-CM

## 2018-07-06 DIAGNOSIS — H65193 Other acute nonsuppurative otitis media, bilateral: Secondary | ICD-10-CM | POA: Diagnosis not present

## 2018-07-06 DIAGNOSIS — J3489 Other specified disorders of nose and nasal sinuses: Secondary | ICD-10-CM | POA: Diagnosis not present

## 2018-07-06 DIAGNOSIS — R0683 Snoring: Secondary | ICD-10-CM

## 2018-07-06 DIAGNOSIS — R519 Headache, unspecified: Secondary | ICD-10-CM

## 2018-07-06 DIAGNOSIS — I10 Essential (primary) hypertension: Secondary | ICD-10-CM | POA: Diagnosis not present

## 2018-07-06 DIAGNOSIS — R05 Cough: Secondary | ICD-10-CM

## 2018-07-06 DIAGNOSIS — R51 Headache: Secondary | ICD-10-CM | POA: Diagnosis not present

## 2018-07-06 DIAGNOSIS — R053 Chronic cough: Secondary | ICD-10-CM

## 2018-07-06 MED ORDER — LORATADINE 10 MG PO TABS: 10.0000 mg | ORAL_TABLET | Freq: Every day | ORAL | 0 refills | Status: DC

## 2018-07-06 MED ORDER — FLUTICASONE PROPIONATE 50 MCG/ACT NA SUSP: 2.0000 | Freq: Every day | NASAL | 6 refills | Status: DC

## 2018-07-06 NOTE — Patient Instructions (Addendum)
-   Continue taking your blood pressure every day. - Keep cutting down your salt and drinking plenty of water. - Increase the amount of vegetables you are eating every day.   - Take the flonase daily for 1 week - Take the loratadine daily as needed (this is over the counter but I sent a rx just in case) - Try a neti pot, you can get this at some drug stores.  - If you develop fevers, chills, shortness of breath, or worsening of your sinus symptoms please come in right away.   - Will check on your pulmonary function test and consider a sleep study.

## 2018-07-06 NOTE — Progress Notes (Signed)
Name: Holly Green   MRN: [id number]    DOB: 1946/01/05   Date:07/06/2018       Progress Note  Subjective  Chief Complaint  Chief Complaint  Patient presents with  . Hypertension    recheck BP medication chanages    HPI  Patient saw Dr. Sanda Klein, PCP, on 7/25 for chronic cough. Was taken off of lisinopril and placed on amlodipine 5mg - started to have headaches on this so stopped amlodipine and switched to losartan 25mg  daily. Presents today for follow-up of blood pressure and cough. Negative chest x-ray for acute process, noted suspected COPD.   Since being off of the lisinopril has noted coughing has decreased but is still present. Smoked socially for one year as teenager. No one in house smokes, doesn't use cigars or pipes either. Endorses coughing ongoing for one year, with lots of sputum- clear and thick. Occasionally wheezing with excessive activity. Denies shortness of breath or trouble taking a deep breath. Has been told she snores when she sleeps. Pulmonary function test ordered hasn't been called yet.   Does endorses mild frontal headache and sinus congestion started 6 days ago, no fevers, chills, states blowing nose with lots of clear discharge. States headache feels like a fullness, took a tylenol before bed that helps her go to sleep but wakes up with it. No blurry vision, nausea, dizziness.   Sleep disturbance Obstructive Sleep Apnea Screening The patient was screened with the STOP-BANG questionnaire. >3 positive responses is considered a positive screen  Snoring:  Yes Tiredness:  Yes  Observed Apnea:  No  Pressure (HTN):  Yes BMI >35:  no Age > 50:  yes Neck >17":  no Gender (female):  no  Total:   3/8 Screen:  Intermediate  May have a slight chance of dozing off when Sitting and reading, Watching TV , Lying down to rest in the afternoon when circumstances permit, Sitting and talking to someone, Sitting  quietly after a lunch without alcohol.    Patient Active Problem List   Diagnosis Date Noted  . Overweight (BMI 25.0-29.9) 06/18/2018  . 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 Anemia 12/17/2017  . Hematochezia   . Diverticulosis of large intestine without diverticulitis   . GI bleed 11/06/2017  . Left flank pain, chronic 03/07/2017  . Spondylolisthesis at L4-L5 level 03/07/2017  . Abdominal aortic atherosclerosis ([place]) 03/05/2017  . Simple cyst of kidney 03/05/2017  . Benign liver cyst 03/05/2017  . Medication monitoring encounter 11/22/2016  . GERD (gastroesophageal reflux disease) 08/07/2016  . Osteoporosis 05/07/2016  . Cardiac murmur 05/07/2016  . Prediabetes 10/15/2015  . Hyperlipidemia 10/15/2015  . Essential hypertension 10/15/2015  . Diverticulosis of colon, acquired 10/15/2015    Past Medical History:  Diagnosis Date  . Abdominal aortic atherosclerosis ([place]) 03/05/2017   CT scan April 2018  . Arthritis   . Bleeding disorder ([place])   . Diverticulitis 2015  . GERD (gastroesophageal reflux disease)   . History of kidney stones   . History of stomach ulcers   . Hyperlipidemia   . Hypertension   . Osteoporosis   . Pre-diabetes   . Prediabetes 10/15/2015  . Spondylolisthesis at L4-L5 level 03/07/2017    Past Surgical History:  Procedure Laterality Date  . ABDOMINAL HYSTERECTOMY    . BACK SURGERY  2010  . BREAST CYST ASPIRATION Right 1990's  . COLONOSCOPY Left 11/09/2017   Procedure: COLONOSCOPY;  Surgeon: Virgel Manifold, MD;  Location: [facility] ENDOSCOPY;  Service: Endoscopy;  Laterality: Left;  . CYSTO WITH HYDRODISTENSION N/A 08/15/2017   Procedure: CYSTOSCOPY/HYDRODISTENSION;  Surgeon: Nickie Retort, MD;  Location: [facility] ORS;  Service: Urology;  Laterality: N/A;  . ESOPHAGOGASTRODUODENOSCOPY Left 11/09/2017   Procedure: ESOPHAGOGASTRODUODENOSCOPY (EGD);  Surgeon: Virgel Manifold, MD;  Location: [facility] ENDOSCOPY;   Service: Endoscopy;  Laterality: Left;  . FOOT SURGERY Right     Social History   Tobacco Use  . Smoking status: Former Research scientist (life sciences)  . Smokeless tobacco: Never Used  . Tobacco comment: teenage years about 1 year  Substance Use Topics  . Alcohol use: No    Alcohol/week: 0.0 standard drinks     Current Outpatient Medications:  .  bisoprolol-hydrochlorothiazide (ZIAC) 10-6.25 MG tablet, TAKE 1 TABLET BY MOUTH ONCE DAILY, Disp: 90 tablet, Rfl: 1 .  Calcium Carb-Cholecalciferol (CALCIUM-VITAMIN D) 600-400 MG-UNIT TABS, Take 1 tablet by mouth daily., Disp: , Rfl:  .  ferrous sulfate (FERROUSUL) 325 (65 FE) MG tablet, One by mouth two or three days a week, Disp: 12 tablet, Rfl: 1 .  losartan (COZAAR) 25 MG tablet, Take 1 tablet (25 mg total) by mouth daily., Disp: 30 tablet, Rfl: 0 .  lovastatin (MEVACOR) 20 MG tablet, TAKE ONE TABLET BY MOUTH AT BEDTIME, Disp: 90 tablet, Rfl: 3 .  polyethylene glycol (MIRALAX / GLYCOLAX) packet, Take 17 g by mouth daily., Disp: , Rfl:  .  ranitidine (ZANTAC 75) 75 MG tablet, Take 1 tablet (75 mg total) by mouth 2 (two) times daily for 14 days., Disp: 28 tablet, Rfl: 0  Allergies  Allergen Reactions  . Aspirin Other (See Comments)    Diverticulosis    Review of Systems  Constitutional: Negative for fever.  HENT: Positive for sinus pain and sore throat. Negative for congestion and ear pain.   Eyes: Negative for blurred vision and double vision.  Respiratory: Positive for cough and wheezing. Negative for shortness of breath.   Cardiovascular: Negative for chest pain.  Neurological: Positive for headaches (sinus headache). Negative for dizziness.  Psychiatric/Behavioral: The patient is not nervous/anxious and does not have insomnia (states has had insomnia in the past but has improved ).     No other specific complaints in a complete review of systems (except as listed in HPI above).  Objective  Vitals:   07/06/18 1140  BP: (!) 142/78  Pulse: 65   Resp: 16  Temp: 98.3 F (36.8 C)  TempSrc: Oral  SpO2: 98%  Weight: 186 lb 4.8 oz (84.5 kg)  Height: 5\' 6"  (1.676 m)    Body mass index is 30.07 kg/m.  Nursing Note and Vital Signs reviewed.  Physical Exam  Constitutional: She appears well-developed and well-nourished. She is cooperative.  HENT:  Head: Normocephalic.  Right Ear: Hearing, external ear and ear canal normal. No tenderness. Tympanic membrane is not erythematous and not bulging. A middle ear effusion is present.  Left Ear: Hearing, external ear and ear canal normal. No tenderness. Tympanic membrane is not erythematous and not bulging. A middle ear effusion is present.  Nose: Nose normal. Right sinus exhibits no maxillary sinus tenderness and no frontal sinus tenderness. Left sinus exhibits no maxillary sinus tenderness and no frontal sinus tenderness.  Mouth/Throat: Uvula is midline, oropharynx is clear and moist and mucous membranes are normal.  Eyes: Pupils are equal, round, and reactive to light. Conjunctivae and EOM are normal.  Neck: Neck supple. No JVD present.  Cardiovascular: Normal rate, regular rhythm and intact distal pulses.  No lower extremity  edema noted.  Pulmonary/Chest: Effort normal and breath sounds normal.  Abdominal: Soft. Normal appearance and bowel sounds are normal.  Lymphadenopathy:    She has no cervical adenopathy.  Neurological: She is alert.  Skin: Skin is warm and dry.  Psychiatric: She has a normal mood and affect. Her speech is normal and behavior is normal. Thought content normal.    No results found for this or any previous visit (from the past 48 hour(s)).  Assessment & Plan 1. Essential hypertension - continue ARB, discussed DASH, continue to lower salt intake and increase vegetables in diet.   2. Chronic cough - stopped lisnopril noted some improvement now on ARB, will continue at this time but will consider coming off of it after PFTs. Consier COPD, sleep apnea,  ACEi/ARB,  chronic sinusitis or allergic rhinitis  3. Sinus pain - Neti pot, tylenol for pain  - loratadine (CLARITIN) 10 MG tablet; Take 1 tablet (10 mg total) by mouth daily.  Dispense: 30 tablet; Refill: 0 - fluticasone (FLONASE) 50 MCG/ACT nasal spray; Place 2 sprays into both nostrils daily.  Dispense: 16 g; Refill: 6  4. Acute nonintractable headache, unspecified headache type - likely sinus infection, neti pot, loratadine, flonase   5. Snoring - will wait for PFTs and consider sleep study discussed with patient would like to get PFTs first   6. Nasal congestion - loratadine (CLARITIN) 10 MG tablet; Take 1 tablet (10 mg total) by mouth daily.  Dispense: 30 tablet; Refill: 0 - fluticasone (FLONASE) 50 MCG/ACT nasal spray; Place 2 sprays into both nostrils daily.  Dispense: 16 g; Refill: 6  7. Acute effusion of both middle ears - loratadine (CLARITIN) 10 MG tablet; Take 1 tablet (10 mg total) by mouth daily.  Dispense: 30 tablet; Refill: 0 - fluticasone (FLONASE) 50 MCG/ACT nasal spray; Place 2 sprays into both nostrils daily.  Dispense: 16 g; Refill: 6   -Red flags and when to present for emergency care or RTC including fever >101.84F, chest pain, shortness of breath, new/worsening/un-resolving symptoms,  reviewed with patient at time of visit. Follow up and care instructions discussed and provided in AVS.

## 2018-07-10 ENCOUNTER — Ambulatory Visit: Payer: Medicare Other | Admitting: Family Medicine

## 2018-07-15 ENCOUNTER — Ambulatory Visit: Payer: Medicare Other | Admitting: Nurse Practitioner

## 2018-07-17 DIAGNOSIS — G8929 Other chronic pain: Secondary | ICD-10-CM | POA: Diagnosis not present

## 2018-07-17 DIAGNOSIS — M1711 Unilateral primary osteoarthritis, right knee: Secondary | ICD-10-CM | POA: Diagnosis not present

## 2018-07-17 DIAGNOSIS — M25561 Pain in right knee: Secondary | ICD-10-CM | POA: Diagnosis not present

## 2018-07-17 DIAGNOSIS — M1120 Other chondrocalcinosis, unspecified site: Secondary | ICD-10-CM | POA: Insufficient documentation

## 2018-07-17 DIAGNOSIS — M25562 Pain in left knee: Secondary | ICD-10-CM | POA: Diagnosis not present

## 2018-07-17 HISTORY — DX: Other chondrocalcinosis, unspecified site: M11.20

## 2018-07-21 ENCOUNTER — Ambulatory Visit: Payer: Medicare Other | Attending: Family Medicine

## 2018-07-21 DIAGNOSIS — R05 Cough: Secondary | ICD-10-CM | POA: Diagnosis not present

## 2018-07-21 DIAGNOSIS — J449 Chronic obstructive pulmonary disease, unspecified: Secondary | ICD-10-CM | POA: Insufficient documentation

## 2018-07-21 MED ORDER — ALBUTEROL SULFATE (2.5 MG/3ML) 0.083% IN NEBU
2.5000 mg | INHALATION_SOLUTION | Freq: Once | RESPIRATORY_TRACT | Status: DC
  Filled 2018-07-21: qty 3

## 2018-07-29 ENCOUNTER — Other Ambulatory Visit: Payer: Self-pay | Admitting: Family Medicine

## 2018-07-29 MED ORDER — LOSARTAN POTASSIUM 25 MG PO TABS: 25.0000 mg | ORAL_TABLET | Freq: Every day | ORAL | 0 refills | Status: DC

## 2018-07-29 NOTE — Telephone Encounter (Signed)
Copied from [place] [id number]. Topic: Quick Communication - Rx Refill/Question >> Jul 29, 2018 10:26 AM Synthia Innocent wrote: Medication: losartan (COZAAR) 25 MG tablet   Has the patient contacted their pharmacy? Yes.   (Agent: If no, request that the patient contact the pharmacy for the refill.) (Agent: If yes, when and what did the pharmacy advise?)  Preferred Pharmacy (with phone number or street name): [facility] on [address]: Please be advised that RX refills may take up to 3 business days. We ask that you follow-up with your pharmacy.

## 2018-07-29 NOTE — Telephone Encounter (Signed)
Last OV: 07/06/18 Next: 08/10/18

## 2018-08-03 ENCOUNTER — Encounter: Payer: Self-pay | Admitting: Family Medicine

## 2018-08-03 DIAGNOSIS — J449 Chronic obstructive pulmonary disease, unspecified: Secondary | ICD-10-CM

## 2018-08-03 HISTORY — DX: Chronic obstructive pulmonary disease, unspecified: J44.9

## 2018-08-04 ENCOUNTER — Telehealth: Payer: Self-pay

## 2018-08-04 DIAGNOSIS — R05 Cough: Secondary | ICD-10-CM

## 2018-08-04 DIAGNOSIS — R059 Cough, unspecified: Secondary | ICD-10-CM

## 2018-08-04 DIAGNOSIS — J449 Chronic obstructive pulmonary disease, unspecified: Secondary | ICD-10-CM

## 2018-08-04 NOTE — Telephone Encounter (Signed)
-----   Message from Arnetha Courser, MD sent at 08/03/2018  5:38 PM EDT ----- Holly Green, please let the patient know that her breathing tests do suggest COPD and we'd like her to see a lung specialist Please REFER to Dr. Mortimer Fries, pulm, for cough and obstructive airways disease

## 2018-08-05 NOTE — Progress Notes (Signed)
[facility] Consultation      Assessment and Plan:  Chronic bronchitis with COPD, emphysema.  -Excess mucus production, which appears to have improved. - Evidence of hyperinflation on imaging and pulmonary function testing, consistent with a diagnosis of emphysema.  Currently she appears minimally symptomatic from this, therefore I do not think that she needs an inhaler at this time.  She is advised that if she does develop dyspnea or excess mucus production to call us and we can consider starting an inhaler. - In the meantime she is asked to increase her physical activity level which will help prevent her COPD emphysema from contributing to dyspnea in the future. - We can help to reduce the risk of exacerbation by giving her flu vaccine today, and Prevnar 13 if she has not received one. - Given her lack of smoking history or secondhand exposures will check alpha-1 antitrypsin.  Chronic rhinitis.   - Patient has symptoms of chronic rhinitis, this had improved with Flonase and Claritin.  She has stopped the Claritin with continued symptoms. - Recommend restarting the Claritin.  Chronic cough. - She has been having chronic cough, this appeared to have improved with cessation of ACE inhibitor. - No further intervention needed in regards to the cough, would avoid ACE inhibitors in the future if possible.  Orders Placed This Encounter  Procedures  . Alpha-1-antitrypsin  . Alpha-1 antitrypsin phenotype   Return in about 1 year (around 08/07/2019).   Date: 08/06/2018  MRN# [id number] CIERRIA Green 1946-07-14    Holly Green is a 72 y.o. old female seen in consultation for chief complaint of:    Chief Complaint  Patient presents with  . Consult    referred by Dr. Sanda Klein for management of COPD: Pt has PFT done 07/21/18.  Marland Kitchen Cough    better than 1 year ago. She coughs up clear mucus.  . Shortness of Breath    not really only if over exerts    HPI:  She is referred  today after testing showed COPD. She underwent the testing because she had been having a cough for about a year. She has been coughing up a lot of mucus. She was changed off lisinopril and the cough improved significantly. She still occasionally brings up some mucus but much better.  She smoked for a few months as a teenager, no significant second hand exposure. 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  The patient notes that her breathing is ok, she has mild dyspnea on moderate to severe exertion.  She can walk a [employer] without a problem, she can clean her house including laundry and vacuuming without being limited by dyspnea.  She has no pets, denies reflux, she does have occasional sinus drainage. She takes claritin daily, and flonase daily and thinks that they help.  **Chest x-ray 06/18/2018>> mild hyperinflation suggestive of emphysema.  Lungs are otherwise unremarkable.  Imaging personally reviewed. **CBC 12/15/2017>> absolute eosinophil count 100 **PFT 07/21/2018>> tracings personally reviewed.  FVC 76% predicted, FEV1 of 69% predicted, there is no significant improvement bronchodilator.  Ratio 72%.  Flow volume loop appears obstructed.  TLC is elevated at 153%, RV/TLC ratio is elevated.  DLCO is slightly reduced at 70%. - Overall this test shows significant air trapping which is suggestive of mild to moderate COPD.   PMHX:   Past Medical History:  Diagnosis Date  . Abdominal aortic atherosclerosis ([place]) 03/05/2017   CT scan April 2018  . Arthritis   .  Bleeding disorder ([place])   . Diverticulitis 2015  . GERD (gastroesophageal reflux disease)   . History of kidney stones   . History of stomach ulcers   . Hyperlipidemia   . Hypertension   . Osteoporosis   . Pre-diabetes   . Prediabetes 10/15/2015  . Spondylolisthesis at L4-L5 level 03/07/2017   Surgical Hx:  Past Surgical History:  Procedure Laterality Date  . ABDOMINAL HYSTERECTOMY    . BACK SURGERY  2010  . BREAST  CYST ASPIRATION Right 1990's  . COLONOSCOPY Left 11/09/2017   Procedure: COLONOSCOPY;  Surgeon: Virgel Manifold, MD;  Location[facility] ENDOSCOPY;  Service: Endoscopy;  Laterality: Left;  . CYSTO WITH HYDRODISTENSION N/A 08/15/2017   Procedure: CYSTOSCOPY/HYDRODISTENSION;  Surgeon: Nickie Retort, MD;  Location: [facility] ORS;  Service: Urology;  Laterality: N/A;  . ESOPHAGOGASTRODUODENOSCOPY Left 11/09/2017   Procedure: ESOPHAGOGASTRODUODENOSCOPY (EGD);  Surgeon: Virgel Manifold, MD;  Location: [facility] ENDOSCOPY;  Service: Endoscopy;  Laterality: Left;  . FOOT SURGERY Right    Family Hx:  Family History  Problem Relation Age of Onset  . Hypertension Brother   . Heart disease Brother   . Heart failure Brother   . Hypertension Mother   . Stroke Mother   . Stroke Father   . Prostate cancer Father   . Aneurysm Sister   . Breast cancer Sister 2  . Liver disease Maternal Aunt   . Heart failure Paternal Aunt   . Heart disease Paternal Uncle   . Heart attack Brother   . Heart disease Brother   . Breast cancer Maternal Aunt   . Heart disease Paternal Aunt   . Kidney cancer Neg Hx   . Bladder Cancer Neg Hx    Social Hx:   Social History   Tobacco Use  . Smoking status: Former Research scientist (life sciences)  . Smokeless tobacco: Never Used  . Tobacco comment: teenage years about 1 year  Substance Use Topics  . Alcohol use: No    Alcohol/week: 0.0 standard drinks  . Drug use: No   Medication:    Current Outpatient Medications:  .  bisoprolol-hydrochlorothiazide (ZIAC) 10-6.25 MG tablet, TAKE 1 TABLET BY MOUTH ONCE DAILY, Disp: 90 tablet, Rfl: 1 .  Calcium Carb-Cholecalciferol (CALCIUM-VITAMIN D) 600-400 MG-UNIT TABS, Take 1 tablet by mouth daily., Disp: , Rfl:  .  ferrous sulfate (FERROUSUL) 325 (65 FE) MG tablet, One by mouth two or three days a week, Disp: 12 tablet, Rfl: 1 .  fluticasone (FLONASE) 50 MCG/ACT nasal spray, Place 2 sprays into both nostrils daily., Disp: 16 g, Rfl: 6 .  loratadine  (CLARITIN) 10 MG tablet, Take 1 tablet (10 mg total) by mouth daily., Disp: 30 tablet, Rfl: 0 .  lovastatin (MEVACOR) 20 MG tablet, TAKE ONE TABLET BY MOUTH AT BEDTIME, Disp: 90 tablet, Rfl: 3 .  polyethylene glycol (MIRALAX / GLYCOLAX) packet, Take 17 g by mouth daily., Disp: , Rfl:  .  Calcium Carbonate-Vitamin D (CALCIUM HIGH POTENCY/VITAMIN D) 600-200 MG-UNIT TABS, Take 1 tablet by mouth daily., Disp: , Rfl:  No current facility-administered medications for this visit.   Facility-Administered Medications Ordered in Other Visits:  .  albuterol (PROVENTIL) (2.5 MG/3ML) 0.083% nebulizer solution 2.5 mg, 2.5 mg, Nebulization, Once, Lada, Satira Anis, MD   Allergies:  Aspirin  Review of Systems: Gen:  Denies  fever, sweats, chills HEENT: Denies blurred vision, double vision. bleeds, sore throat Cvc:  No dizziness, chest pain. Resp:   Denies cough or sputum production, shortness of breath Gi:  Denies swallowing difficulty, stomach pain. Gu:  Denies bladder incontinence, burning urine Ext:   No Joint pain, stiffness. Skin: No skin rash,  hives  Endoc:  No polyuria, polydipsia. Psych: No depression, insomnia. Other:  All other systems were reviewed with the patient and were negative other that what is mentioned in the HPI.   Physical Examination:   VS: BP 122/88 (BP Location: Left Arm, Cuff Size: Normal)   Pulse 68   Resp 16   Ht 5\' 6"  (1.676 m)   Wt 186 lb (84.4 kg)   SpO2 98%   BMI 30.02 kg/m   General Appearance: No distress  Neuro:without focal findings,  speech normal,  HEENT: PERRLA, EOM intact.   Pulmonary: normal breath sounds, No wheezing.  CardiovascularNormal S1,S2.  No m/r/g.   Abdomen: Benign, Soft, non-tender. Renal:  No costovertebral tenderness  GU:  No performed at this time. Endoc: No evident thyromegaly, no signs of acromegaly. Skin:   warm, no rashes, no ecchymosis  Extremities: normal, no cyanosis, clubbing.  Other findings:    LABORATORY PANEL:    CBC No results for input(s): WBC, HGB, HCT, PLT in the last 168 hours. ------------------------------------------------------------------------------------------------------------------  Chemistries  No results for input(s): NA, K, CL, CO2, GLUCOSE, BUN, CREATININE, CALCIUM, MG, AST, ALT, ALKPHOS, BILITOT in the last 168 hours.  Invalid input(s): GFRCGP ------------------------------------------------------------------------------------------------------------------  Cardiac Enzymes No results for input(s): TROPONINI in the last 168 hours. ------------------------------------------------------------  RADIOLOGY:  No results found.     Thank  you for the consultation and for allowing [facility] to assist in the care of your patient. Our recommendations are noted above.  Please contact us if we can be of further service.   Marda Stalker, M.D., F.C.C.P.  Board Certified in Internal Medicine, Pulmonary Medicine, [place], and Sleep Medicine.  [facility] Office Number: [phone]   08/06/2018

## 2018-08-06 ENCOUNTER — Encounter: Payer: Self-pay | Admitting: Internal Medicine

## 2018-08-06 ENCOUNTER — Other Ambulatory Visit
Admission: RE | Admit: 2018-08-06 | Discharge: 2018-08-06 | Disposition: A | Discharge status: Home / Self Care | Payer: Medicare Other | Source: Ambulatory Visit | Attending: Internal Medicine | Admitting: Internal Medicine

## 2018-08-06 ENCOUNTER — Ambulatory Visit (INDEPENDENT_AMBULATORY_CARE_PROVIDER_SITE_OTHER): Payer: Medicare Other | Admitting: Internal Medicine

## 2018-08-06 VITALS — BP 122/88 | HR 68 | Resp 16 | Ht 66.0 in | Wt 186.0 lb

## 2018-08-06 DIAGNOSIS — Z23 Encounter for immunization: Secondary | ICD-10-CM

## 2018-08-06 DIAGNOSIS — J449 Chronic obstructive pulmonary disease, unspecified: Secondary | ICD-10-CM

## 2018-08-06 NOTE — Addendum Note (Signed)
Addended by: Stephanie Coup on: 08/06/2018 11:58 AM   Modules accepted: Orders

## 2018-08-06 NOTE — Addendum Note (Signed)
Addended by: Stephanie Coup on: 08/06/2018 12:16 PM   Modules accepted: Orders

## 2018-08-06 NOTE — Patient Instructions (Addendum)
Try to be active and do exercise as tolerated.  Will send for blood work.  Continue claritin.  Will give flu shot, and pneumonia ([id number]) if not given already.

## 2018-08-07 LAB — ALPHA-1 ANTITRYPSIN PHENOTYPE: A-1 Antitrypsin, Ser: 131 mg/dL (ref 90–200)

## 2018-08-10 ENCOUNTER — Ambulatory Visit (INDEPENDENT_AMBULATORY_CARE_PROVIDER_SITE_OTHER): Payer: Medicare Other | Admitting: Family Medicine

## 2018-08-10 ENCOUNTER — Encounter: Payer: Self-pay | Admitting: Family Medicine

## 2018-08-10 VITALS — BP 124/82 | HR 65 | Temp 98.2°F | Ht 66.0 in | Wt 186.6 lb

## 2018-08-10 DIAGNOSIS — Z5181 Encounter for therapeutic drug level monitoring: Secondary | ICD-10-CM

## 2018-08-10 DIAGNOSIS — E782 Mixed hyperlipidemia: Secondary | ICD-10-CM

## 2018-08-10 DIAGNOSIS — D508 Other iron deficiency anemias: Secondary | ICD-10-CM | POA: Diagnosis not present

## 2018-08-10 DIAGNOSIS — E66811 Obesity, class 1: Secondary | ICD-10-CM

## 2018-08-10 DIAGNOSIS — E669 Obesity, unspecified: Secondary | ICD-10-CM | POA: Insufficient documentation

## 2018-08-10 DIAGNOSIS — J3489 Other specified disorders of nose and nasal sinuses: Secondary | ICD-10-CM

## 2018-08-10 DIAGNOSIS — J3089 Other allergic rhinitis: Secondary | ICD-10-CM

## 2018-08-10 DIAGNOSIS — I10 Essential (primary) hypertension: Secondary | ICD-10-CM

## 2018-08-10 DIAGNOSIS — R7303 Prediabetes: Secondary | ICD-10-CM

## 2018-08-10 DIAGNOSIS — M810 Age-related osteoporosis without current pathological fracture: Secondary | ICD-10-CM | POA: Diagnosis not present

## 2018-08-10 DIAGNOSIS — H65193 Other acute nonsuppurative otitis media, bilateral: Secondary | ICD-10-CM

## 2018-08-10 DIAGNOSIS — K219 Gastro-esophageal reflux disease without esophagitis: Secondary | ICD-10-CM | POA: Diagnosis not present

## 2018-08-10 DIAGNOSIS — J439 Emphysema, unspecified: Secondary | ICD-10-CM

## 2018-08-10 DIAGNOSIS — J309 Allergic rhinitis, unspecified: Secondary | ICD-10-CM | POA: Insufficient documentation

## 2018-08-10 DIAGNOSIS — R0981 Nasal congestion: Secondary | ICD-10-CM

## 2018-08-10 MED ORDER — LORATADINE 10 MG PO TABS: 10.0000 mg | ORAL_TABLET | Freq: Every day | ORAL | 11 refills | Status: DC

## 2018-08-10 NOTE — Assessment & Plan Note (Signed)
Get DEXA scan; fall precautions; get 1200 mg of calcium daily, vit D 800 to 1000 iu or more if needed (check vit D today)

## 2018-08-10 NOTE — Assessment & Plan Note (Signed)
Check A1c; truly fasting glucose; try to lose 5-10 pounds by the end of the year

## 2018-08-10 NOTE — Assessment & Plan Note (Signed)
Continue flonase and claritin; avoid triggers

## 2018-08-10 NOTE — Assessment & Plan Note (Signed)
Well-controlled; continue med; try DASH guidelines

## 2018-08-10 NOTE — Assessment & Plan Note (Signed)
Continue medicine; limit saturated fats

## 2018-08-10 NOTE — Progress Notes (Signed)
BP 124/82   Pulse 65   Temp 98.2 F (36.8 C)   Ht 5\' 6"  (1.676 m)   Wt 186 lb 9.6 oz (84.6 kg)   SpO2 98%   BMI 30.12 kg/m    Subjective:    Patient ID: Holly Green, female    DOB: [birth year], 72 y.o.   MRN: [id number]  HPI: Holly Green is a 72 y.o. female  Chief Complaint  Patient presents with  . Follow-up    HPI Patient is here for f/u  She has high blood pressure; on bisoprolol-hctz; chronic issue, stable, controlled; does add a little salt to her food, using lower sodium salt  Allergic rhinitis; chronic, on claritin and flonase  COPD; seeing lung doctor now; Dr. Ashby Dawes, just saw him last visit; note reviewed; wants her to continue treating allergies; chronic cough, better off of the ACE-I; she received a PCV-13 at his office  High cholesterol; on lovastatin; chronic problem; well-controlled; not a big egg eater; not much fatty meat; more ground [place], not much red meat; aortic atherosclerosis noted on prior CT scan Lab Results  Component Value Date   CHOL 161 12/08/2017   HDL 91 12/08/2017   LDLCALC 59 12/08/2017   TRIG 40 12/08/2017   CHOLHDL 1.8 12/08/2017   Osteoporosis; last DEXA was in 2016; she is getting greens; she does not eat yogurt; no real milk intake; coconut milk for cooking only; taking calcium plus D supplements; one level living; she does not really walk; she works at a [employer], up on her feet  Low iron, mild anemia; finished supplemental iron; no blood in the stool, colonoscopy  Prediabetes; last A1c was 5.7 in January; just a little dry mouth in the morning; no blurred vision; brother had diabetes  GERD; no symptoms, no meds, improved  Chondrocalcinosis of the knee, OA of the left knee; weight loss, walking, cortisone injection given at rheum's office, that provided relief  Depression screen [facility] 2/9 08/10/2018 07/06/2018 06/18/2018 11/17/2017 06/20/2017  Decreased Interest 0 0 0 0 0  Down, Depressed, Hopeless 0 0 0 0 0  PHQ - 2  Score 0 0 0 0 0  Altered sleeping - 0 - - -  Tired, decreased energy - 0 - - -  Change in appetite - 0 - - -  Feeling bad or failure about yourself  - 0 - - -  Trouble concentrating - 0 - - -  Moving slowly or fidgety/restless - 0 - - -  Suicidal thoughts - 0 - - -  PHQ-9 Score - 0 - - -  Difficult doing work/chores - Not difficult at all - - -    Relevant past medical, surgical, family and social history reviewed Past Medical History:  Diagnosis Date  . Abdominal aortic atherosclerosis ([place]) 03/05/2017   CT scan April 2018  . Arthritis   . Bleeding disorder ([place])   . Diverticulitis 2015  . GERD (gastroesophageal reflux disease)   . History of kidney stones   . History of stomach ulcers   . Hyperlipidemia   . Hypertension   . Osteoporosis   . Pre-diabetes   . Prediabetes 10/15/2015  . Spondylolisthesis at L4-L5 level 03/07/2017   Past Surgical History:  Procedure Laterality Date  . ABDOMINAL HYSTERECTOMY    . BACK SURGERY  2010  . BREAST CYST ASPIRATION Right 1990's  . COLONOSCOPY Left 11/09/2017   Procedure: COLONOSCOPY;  Surgeon: Virgel Manifold, MD;  Location: [facility] ENDOSCOPY;  Service:  Endoscopy;  Laterality: Left;  . CYSTO WITH HYDRODISTENSION N/A 08/15/2017   Procedure: CYSTOSCOPY/HYDRODISTENSION;  Surgeon: Nickie Retort, MD;  Location: [facility] ORS;  Service: Urology;  Laterality: N/A;  . ESOPHAGOGASTRODUODENOSCOPY Left 11/09/2017   Procedure: ESOPHAGOGASTRODUODENOSCOPY (EGD);  Surgeon: Virgel Manifold, MD;  Location: [facility] ENDOSCOPY;  Service: Endoscopy;  Laterality: Left;  . FOOT SURGERY Right    Family History  Problem Relation Age of Onset  . Hypertension Brother   . Heart disease Brother   . Heart failure Brother   . Hypertension Mother   . Stroke Mother   . Stroke Father   . Prostate cancer Father   . Aneurysm Sister   . Breast cancer Sister 13  . Liver disease Maternal Aunt   . Heart failure Paternal Aunt   . Heart disease Paternal Uncle     . Heart attack Brother   . Heart disease Brother   . Breast cancer Maternal Aunt   . Heart disease Paternal Aunt   . Kidney cancer Neg Hx   . Bladder Cancer Neg Hx    Social History   Tobacco Use  . Smoking status: Former Research scientist (life sciences)  . Smokeless tobacco: Never Used  . Tobacco comment: teenage years about 1 year  Substance Use Topics  . Alcohol use: No    Alcohol/week: 0.0 standard drinks  . Drug use: No    Interim medical history since last visit reviewed. Allergies and medications reviewed  Review of Systems Per HPI unless specifically indicated above     Objective:    BP 124/82   Pulse 65   Temp 98.2 F (36.8 C)   Ht 5\' 6"  (1.676 m)   Wt 186 lb 9.6 oz (84.6 kg)   SpO2 98%   BMI 30.12 kg/m   Wt Readings from Last 3 Encounters:  08/10/18 186 lb 9.6 oz (84.6 kg)  08/06/18 186 lb (84.4 kg)  07/06/18 186 lb 4.8 oz (84.5 kg)    Physical Exam  Constitutional: She appears well-developed and well-nourished. No distress.  HENT:  Head: Normocephalic and atraumatic.  Eyes: EOM are normal. No scleral icterus.  Neck: No thyromegaly present.  Cardiovascular: Normal rate, regular rhythm and normal heart sounds.  No murmur heard. Pulmonary/Chest: Effort normal and breath sounds normal. No respiratory distress. She has no wheezes.  Abdominal: Soft. Bowel sounds are normal. She exhibits no distension.  Musculoskeletal: She exhibits no edema.  Neurological: She is alert.  Skin: Skin is warm and dry. She is not diaphoretic. No pallor.  Psychiatric: She has a normal mood and affect. Her behavior is normal. Judgment and thought content normal.   Results for orders placed or performed during the hospital encounter of 08/06/18  Alpha-1 antitrypsin phenotype  Result Value Ref Range   A-1 Antitrypsin Pheno MM    A-1 Antitrypsin, Ser 131 90 - 200 mg/dL      Assessment & Plan:   Problem List Items Addressed This Visit      Cardiovascular and Mediastinum   Essential  hypertension (Chronic)    Well-controlled; continue med; try DASH guidelines        Respiratory   Emphysema of lung ([place]) (Chronic)    Noted in lung doctor's note from last week; may have been work exposure she says as she never smoked and did not grow up with smokers; minimally symptomatic and pulmonologist did not think inhaler was needed; f/u with him in one year; treat allergies      Relevant Medications  loratadine (CLARITIN) 10 MG tablet   Allergic rhinitis    Continue flonase and claritin; avoid triggers        Digestive   GERD (gastroesophageal reflux disease) (Chronic)    Resolved, no symptoms        Musculoskeletal and Integument   Osteoporosis - Primary (Chronic)    Get DEXA scan; fall precautions; get 1200 mg of calcium daily, vit D 800 to 1000 iu or more if needed (check vit D today)      Relevant Orders   DG Bone Density   VITAMIN D 25 Hydroxy (Vit-D Deficiency, Fractures)     Other   Medication monitoring encounter   Relevant Orders   COMPLETE METABOLIC PANEL WITH GFR   RESOLVED: Prediabetes    Check A1c; truly fasting glucose; try to lose 5-10 pounds by the end of the year      Relevant Orders   Hemoglobin A1c   Obesity (BMI 30.0-34.9) (Chronic)    Try to lose 5-10 pounds by the end of the year      Hyperlipidemia (Chronic)    Continue medicine; limit saturated fats      Relevant Orders   Lipid panel   Anemia    Check CBC and iron studies      Relevant Orders   CBC with Differential/Platelet   Iron, TIBC and Ferritin Panel    Other Visit Diagnoses    Sinus pain       Relevant Medications   loratadine (CLARITIN) 10 MG tablet   Nasal congestion       Relevant Medications   loratadine (CLARITIN) 10 MG tablet   Acute effusion of both middle ears       Relevant Medications   loratadine (CLARITIN) 10 MG tablet       Follow up plan: Return in about 6 months (around 02/08/2019).  An after-visit summary was printed and given to the  patient at [place].  Please see the patient instructions which may contain other information and recommendations beyond what is mentioned above in the assessment and plan.  Meds ordered this encounter  Medications  . loratadine (CLARITIN) 10 MG tablet    Sig: Take 1 tablet (10 mg total) by mouth daily.    Dispense:  30 tablet    Refill:  11    Orders Placed This Encounter  Procedures  . DG Bone Density  . Hemoglobin A1c  . Lipid panel  . CBC with Differential/Platelet  . Iron, TIBC and Ferritin Panel  . COMPLETE METABOLIC PANEL WITH GFR  . VITAMIN D 25 Hydroxy (Vit-D Deficiency, Fractures)

## 2018-08-10 NOTE — Patient Instructions (Addendum)
Let's get labs today Check out the information at familydoctor.org entitled "Nutrition for Weight Loss: What You Need to Know about Fad Diets" Try to lose between 1-2 pounds per week by taking in fewer calories and burning off more calories You can succeed by limiting portions, limiting foods dense in calories and fat, becoming more active, and drinking 8 glasses of water a day (64 ounces) Don't skip meals, especially breakfast, as skipping meals may alter your metabolism Do not use over-the-counter weight loss pills or gimmicks that claim rapid weight loss A healthy BMI (or body mass index) is between 18.5 and 24.9 You can calculate your ideal BMI at the [place] website ClubMonetize.fr Try to follow the DASH guidelines (DASH stands for Dietary Approaches to Stop Hypertension). Try to limit the sodium in your diet to no more than 1,500mg  of sodium per day. Certainly try to not exceed 2,000 mg per day at the very most. Do not add salt when cooking or at the table.  Check the sodium amount on labels when shopping, and choose items lower in sodium when given a choice. Avoid or limit foods that already contain a lot of sodium. Eat a diet rich in fruits and vegetables and whole grains, and try to lose weight if overweight or obese Try to get 1200 mg of calcium daily, divided up in 2 to 3 servings; best in food or drinks, supplement if needed Take 1,000 iu of vitamin D3 once a day Practice good fall precautions Please do call to schedule your bone density study; the number to schedule one at either [facility] or [facility] is [phone] or [phone]  Osteoporosis Osteoporosis happens when your bones become thinner and weaker. Weak bones can break (fracture) more easily when you slip or fall. Bones most at risk of breaking are in the hip, wrist, and spine. Follow these instructions at home:  Get enough calcium and  vitamin D. These nutrients are good for your bones.  Exercise as told by your doctor.  Do not use any tobacco products. This includes cigarettes, chewing tobacco, and electronic cigarettes. If you need help quitting, ask your doctor.  Limit the amount of alcohol you drink.  Take medicines only as told by your doctor.  Keep all follow-up visits as told by your doctor. This is important.  Take care at home to prevent falls. Some ways to do this are: ? Keep rooms well lit and tidy. ? Put safety rails on your stairs. ? Put a rubber mat in the bathroom and other places that are often wet or slippery. Get help right away if:  You fall.  You hurt yourself. This information is not intended to replace advice given to you by your health care provider. Make sure you discuss any questions you have with your health care provider. Document Released: 02/03/2012 Document Revised: 04/18/2016 Document Reviewed: 04/21/2014 Elsevier Interactive Patient Education  [employer].

## 2018-08-10 NOTE — Assessment & Plan Note (Signed)
Noted in lung doctor's note from last week; may have been work exposure she says as she never smoked and did not grow up with smokers; minimally symptomatic and pulmonologist did not think inhaler was needed; f/u with him in one year; treat allergies

## 2018-08-10 NOTE — Assessment & Plan Note (Signed)
Resolved, no symptoms

## 2018-08-10 NOTE — Assessment & Plan Note (Signed)
Check CBC and iron studies. 

## 2018-08-10 NOTE — Assessment & Plan Note (Signed)
Try to lose 5-10 pounds by the end of the year

## 2018-08-11 LAB — COMPLETE METABOLIC PANEL WITH GFR
AG Ratio: 1.6 (calc) (ref 1.0–2.5)
ALT: 14 U/L (ref 6–29)
AST: 20 U/L (ref 10–35)
Albumin: 4.2 g/dL (ref 3.6–5.1)
Alkaline phosphatase (APISO): 87 U/L (ref 33–130)
BUN: 16 mg/dL (ref 7–25)
CO2: 32 mmol/L (ref 20–32)
Calcium: 9.9 mg/dL (ref 8.6–10.4)
Chloride: 102 mmol/L (ref 98–110)
Creat: 0.6 mg/dL (ref 0.60–0.93)
GFR, Est African American: 107 mL/min/{1.73_m2} (ref >=60)
GFR, Est Non African American: 92 mL/min/{1.73_m2} (ref >=60)
Globulin: 2.7 g/dL (calc) (ref 1.9–3.7)
Glucose, Bld: 82 mg/dL (ref 65–99)
Potassium: 3.9 mmol/L (ref 3.5–5.3)
Sodium: 140 mmol/L (ref 135–146)
Total Bilirubin: 1.1 mg/dL (ref 0.2–1.2)
Total Protein: 6.9 g/dL (ref 6.1–8.1)

## 2018-08-11 LAB — CBC WITH DIFFERENTIAL/PLATELET
Basophils Absolute: 20 cells/uL (ref 0–200)
Basophils Relative: 0.5 %
Eosinophils Absolute: 112 cells/uL (ref 15–500)
Eosinophils Relative: 2.8 %
HCT: 37.5 % (ref 35.0–45.0)
Hemoglobin: 12.4 g/dL (ref 11.7–15.5)
Lymphs Abs: 1512 cells/uL (ref 850–3900)
MCH: 29.5 pg (ref 27.0–33.0)
MCHC: 33.1 g/dL (ref 32.0–36.0)
MCV: 89.3 fL (ref 80.0–100.0)
MPV: 8.9 fL (ref 7.5–12.5)
Monocytes Relative: 9.8 %
Neutro Abs: 1964 cells/uL (ref 1500–7800)
Neutrophils Relative %: 49.1 %
Platelets: 196 10*3/uL (ref 140–400)
RBC: 4.2 10*6/uL (ref 3.80–5.10)
RDW: 12.3 % (ref 11.0–15.0)
Total Lymphocyte: 37.8 %
WBC mixed population: 392 cells/uL (ref 200–950)
WBC: 4 10*3/uL (ref 3.8–10.8)

## 2018-08-11 LAB — HEMOGLOBIN A1C
Hgb A1c MFr Bld: 5.9 % of total Hgb — ABNORMAL HIGH (ref <5.7)
Mean Plasma Glucose: 123 (calc)
eAG (mmol/L): 6.8 (calc)

## 2018-08-11 LAB — IRON,TIBC AND FERRITIN PANEL
%SAT: 15 % (calc) — ABNORMAL LOW (ref 16–45)
Ferritin: 53 ng/mL (ref 16–288)
Iron: 56 ug/dL (ref 45–160)
TIBC: 372 mcg/dL (calc) (ref 250–450)

## 2018-08-11 LAB — LIPID PANEL
Cholesterol: 188 mg/dL (ref <200)
HDL: 86 mg/dL (ref >50)
LDL Cholesterol (Calc): 92 mg/dL (calc)
Non-HDL Cholesterol (Calc): 102 mg/dL (calc) (ref <130)
Total CHOL/HDL Ratio: 2.2 (calc) (ref <5.0)
Triglycerides: 35 mg/dL (ref <150)

## 2018-08-11 LAB — VITAMIN D 25 HYDROXY (VIT D DEFICIENCY, FRACTURES): Vit D, 25-Hydroxy: 36 ng/mL (ref 30–100)

## 2018-08-17 ENCOUNTER — Encounter: Payer: Self-pay | Admitting: Family Medicine

## 2018-08-19 ENCOUNTER — Ambulatory Visit
Admission: RE | Admit: 2018-08-19 | Discharge: 2018-08-19 | Disposition: A | Discharge status: Home / Self Care | Payer: Medicare Other | Source: Ambulatory Visit | Attending: Family Medicine | Admitting: Family Medicine

## 2018-08-19 DIAGNOSIS — M810 Age-related osteoporosis without current pathological fracture: Secondary | ICD-10-CM | POA: Diagnosis not present

## 2018-08-28 ENCOUNTER — Other Ambulatory Visit: Payer: Self-pay | Admitting: Family Medicine

## 2018-08-28 MED ORDER — ALENDRONATE SODIUM 70 MG PO TABS: 70.0000 mg | ORAL_TABLET | ORAL | 11 refills | Status: DC

## 2018-08-28 NOTE — Progress Notes (Signed)
Start bisphosphonate

## 2018-08-30 ENCOUNTER — Other Ambulatory Visit: Payer: Self-pay | Admitting: Nurse Practitioner

## 2018-08-31 ENCOUNTER — Other Ambulatory Visit: Payer: Self-pay

## 2018-08-31 DIAGNOSIS — M1120 Other chondrocalcinosis, unspecified site: Secondary | ICD-10-CM | POA: Diagnosis not present

## 2018-08-31 DIAGNOSIS — M1711 Unilateral primary osteoarthritis, right knee: Secondary | ICD-10-CM | POA: Diagnosis not present

## 2018-08-31 NOTE — Telephone Encounter (Signed)
Refill request for Hypertension medication:  Losartan 25 mg  Last office visit pertaining to hypertension: 08/10/2018  BP Readings from Last 3 Encounters:  08/10/18 124/82  08/06/18 122/88  07/06/18 (!) 142/78     Lab Results  Component Value Date   CREATININE 0.60 08/10/2018   BUN 16 08/10/2018   NA 140 08/10/2018   K 3.9 08/10/2018   CL 102 08/10/2018   CO2 32 08/10/2018   Follow-ups on file. 02/08/2019  Patient was confused when I talk to her. She was saying that she need Loratadine refilled. Wasn't sure if she was talking Losartan or not. After I explained to her that her Loratadine was Claritin for sinus pain, she said she did not need that. Please advise.

## 2018-09-01 MED ORDER — LOSARTAN POTASSIUM 25 MG PO TABS: 25.0000 mg | ORAL_TABLET | Freq: Every day | ORAL | 5 refills | Status: DC

## 2018-09-01 NOTE — Telephone Encounter (Signed)
It appears losartan was taking off of her medication list on 9/12 listed as patient preference.  Patient states she was taking losartan daily has been on it for 3 months; when she was taken off of lisinopril and ran out on Sunday. Patient had questions about alendronate and osteoporosis diagnosis- all questions were answered. Patient was appreciative for phone call, informed 6 month supply of losartan would be sent to pharmacy.

## 2018-09-01 NOTE — Telephone Encounter (Signed)
Patient is calling back and sounds confused, she said she thought she was suppose to be taking a bone medication. She said she is currently out of her medication " Losartan " . Please advise.

## 2018-09-08 ENCOUNTER — Encounter: Payer: Self-pay | Admitting: Gastroenterology

## 2018-09-08 ENCOUNTER — Other Ambulatory Visit: Payer: Self-pay

## 2018-09-08 ENCOUNTER — Ambulatory Visit (INDEPENDENT_AMBULATORY_CARE_PROVIDER_SITE_OTHER): Payer: Medicare Other | Admitting: Gastroenterology

## 2018-09-08 VITALS — BP 179/82 | HR 64 | Ht 66.0 in | Wt 188.8 lb

## 2018-09-08 DIAGNOSIS — K219 Gastro-esophageal reflux disease without esophagitis: Secondary | ICD-10-CM | POA: Diagnosis not present

## 2018-09-08 NOTE — Progress Notes (Signed)
Holly Antigua, MD [address]  [place]  [place], [place] [address]  Main: [phone]  Fax: [phone]   Primary Care Physician: Holly Courser, MD  Primary Gastroenterologist:  Dr. Vonda Green  Chief Complaint  Patient presents with  . Follow-up    3 month, reflux    HPI: Holly Green is a 72 y.o. female here for follow-up of reflux which is completely resolved at this time.  Patient only took Zantac for 2 to 3 weeks and stopped it and reflux is under the sternum.  She denies any abdominal pain, nausea vomiting, altered bowel habits, melena or hematochezia, or any other alarm symptoms.  Reports one soft bowel movement today, consuming high-fiber diet and using MiraLAX.  She was referred to surgery due to history of diverticular bleeding and they do not recommend surgical intervention at this time, see their consult note under scanned chart.  Previous history:  Patient has a history of diverticular bleeding, last hospitalized for it in December 2018.  "She underwent an EGD that was normal on that admission, and a colonoscopy that showed diffuse diverticulosis, and the cecum, ascending colon, descending colon and sigmoid colon. Majority of the diverticulosis was in the left colon. None of these were actively bleeding and no intervention was needed during the colonoscopy.   Repeat recommended in 5 years.  She has history of diverticular bleeds in the past. In 2015 she had similar symptoms and and colonoscopy was planned but due to significant bleeding, RBC scan was done and was positive for hemorrhage in the right colon likely related to bleeding diverticulum. Patient underwent coil embolization at that time. Prior to this in 2009 she also had a positive RBC scan in the mid transverse and descending colon consistent with GI bleed.  In 2012 patient had an upper endoscopy which showed hiatal hernia, erosive gastropathy, colonoscopy was done for a lot of  abdominal pain at the time and showed solitary ulcer in the descending colon."   Current Outpatient Medications  Medication Sig Dispense Refill  . alendronate (FOSAMAX) 70 MG tablet Take 1 tablet (70 mg total) by mouth every 7 (seven) days. Take with a full glass of water on an empty stomach. 4 tablet 11  . bisoprolol-hydrochlorothiazide (ZIAC) 10-6.25 MG tablet TAKE 1 TABLET BY MOUTH ONCE DAILY 90 tablet 1  . Calcium Carb-Cholecalciferol (CALCIUM-VITAMIN D) 600-400 MG-UNIT TABS Take 1 tablet by mouth daily.    . fluticasone (FLONASE) 50 MCG/ACT nasal spray Place 2 sprays into both nostrils daily. 16 g 6  . loratadine (CLARITIN) 10 MG tablet Take 1 tablet (10 mg total) by mouth daily. 30 tablet 11  . losartan (COZAAR) 25 MG tablet Take 1 tablet (25 mg total) by mouth daily. 30 tablet 5  . lovastatin (MEVACOR) 20 MG tablet TAKE ONE TABLET BY MOUTH AT BEDTIME 90 tablet 3  . polyethylene glycol (MIRALAX / GLYCOLAX) packet Take 17 g by mouth daily.     No current facility-administered medications for this visit.     Allergies as of 09/08/2018 - Review Complete 09/08/2018  Allergen Reaction Noted  . Aspirin Other (See Comments) 08/08/2017    ROS:  General: Negative for anorexia, weight loss, fever, chills, fatigue, weakness. ENT: Negative for hoarseness, difficulty swallowing , nasal congestion. CV: Negative for chest pain, angina, palpitations, dyspnea on exertion, peripheral edema.  Respiratory: Negative for dyspnea at rest, dyspnea on exertion, cough, sputum, wheezing.  GI: See history of present illness. GU:  Negative for  dysuria, hematuria, urinary incontinence, urinary frequency, nocturnal urination.  Endo: Negative for unusual weight change.    Physical Examination:   BP (!) 179/82   Pulse 64   Ht 5\' 6"  (1.676 m)   Wt 188 lb 12.8 oz (85.6 kg)   BMI 30.47 kg/m   General: Well-nourished, well-developed in no acute distress.  Eyes: No icterus. Conjunctivae pink. Mouth:  Oropharyngeal mucosa moist and pink , no lesions erythema or exudate. Neck: Supple, Trachea midline Abdomen: Bowel sounds are normal, nontender, nondistended, no hepatosplenomegaly or masses, no abdominal bruits or hernia , no rebound or guarding.   Extremities: No lower extremity edema. No clubbing or deformities. Neuro: Alert and oriented x 3.  Grossly intact. Skin: Warm and dry, no jaundice.   Psych: Alert and cooperative, normal mood and affect.   Labs: CMP     Component Value Date/Time   NA 140 08/10/2018 1200   NA 140 05/07/2016 0930   NA 141 01/16/2014 0514   K 3.9 08/10/2018 1200   K 3.5 01/16/2014 0514   CL 102 08/10/2018 1200   CL 108 (H) 01/16/2014 0514   CO2 32 08/10/2018 1200   CO2 28 01/16/2014 0514   GLUCOSE 82 08/10/2018 1200   GLUCOSE 104 (H) 01/16/2014 0514   BUN 16 08/10/2018 1200   BUN 20 05/07/2016 0930   BUN 2 (L) 01/16/2014 0514   CREATININE 0.60 08/10/2018 1200   CALCIUM 9.9 08/10/2018 1200   CALCIUM 7.6 (L) 01/16/2014 0514   PROT 6.9 08/10/2018 1200   PROT 6.9 05/07/2016 0930   PROT 6.2 (L) 01/11/2014 1121   ALBUMIN 3.6 11/06/2017 0816   ALBUMIN 4.1 05/07/2016 0930   ALBUMIN 3.0 (L) 01/11/2014 1121   AST 20 08/10/2018 1200   AST 24 01/11/2014 1121   ALT 14 08/10/2018 1200   ALT 16 01/11/2014 1121   ALKPHOS 87 11/06/2017 0816   ALKPHOS 69 01/11/2014 1121   BILITOT 1.1 08/10/2018 1200   BILITOT 0.8 05/07/2016 0930   BILITOT 0.4 01/11/2014 1121   GFRNONAA 92 08/10/2018 1200   GFRAA 107 08/10/2018 1200   Lab Results  Component Value Date   WBC 4.0 08/10/2018   HGB 12.4 08/10/2018   HCT 37.5 08/10/2018   MCV 89.3 08/10/2018   PLT 196 08/10/2018    Imaging Studies: Dg Bone Density  Result Date: 08/19/2018 EXAM: DUAL X-RAY ABSORPTIOMETRY (DXA) FOR BONE MINERAL DENSITY IMPRESSION: Dear Dr. Sanda Green, Your patient Holly Green completed a BMD test on 08/19/2018 using the [place] (analysis version: 14.10) manufactured by [employer]. The following summarizes the results of our evaluation. PATIENT BIOGRAPHICAL: Name: Holly Green Patient ID: [id number] Birth Date: 11-07-1946 Height: 64.0 in. Gender: Female Exam Date: 08/19/2018 Weight: 187.3 lbs. Indications: Advanced Age, Height Loss, Hysterectomy, Postmenopausal Fractures: Treatments: Calcium, Vitamin D ASSESSMENT: The BMD measured at AP Spine L1-L3 is 0.840 g/cm2 with a T-score of -2.8. This patient is considered osteoporotic according to [facility]) criteria. L-4 was excluded due to advanced degenerative changes. Site Region Measured Measured WHO Young Adult BMD Date       Age      Classification T-score AP Spine L1-L3 08/19/2018 71.0 Osteoporosis -2.8 0.840 g/cm2 AP Spine L1-L3 06/21/2015 67.8 Osteoporosis -3.0 0.819 g/cm2 AP Spine L1-L3 06/21/2015 67.8 Osteoporosis -3.2 0.795 g/cm2 AP Spine L1-L3 03/03/2013 65.5 Osteoporosis -3.5 0.750 g/cm2 DualFemur Total Right 08/19/2018 71.0 Osteopenia -1.6 0.812 g/cm2 DualFemur Total Right 06/21/2015 67.8 Osteopenia -1.3 0.839 g/cm2 DualFemur Total  Right 03/03/2013 65.5 Osteopenia -1.3 0.838 g/cm2 DualFemur Total Mean 08/19/2018 71.0 Osteopenia -1.5 0.815 g/cm2 DualFemur Total Mean 06/21/2015 67.8 Osteopenia -1.2 0.855 g/cm2 DualFemur Total Mean 03/03/2013 65.5 Osteopenia -1.3 0.843 g/cm2 World Health Organization [facility]) criteria for post-menopausal, Caucasian Women: Normal:       T-score at or above -1 SD Osteopenia:   T-score between -1 and -2.5 SD Osteoporosis: T-score at or below -2.5 SD RECOMMENDATIONS: 1. All patients should optimize calcium and vitamin D intake. 2. Consider FDA-approved medical therapies in postmenopausal women and men aged 49 years and older, based on the following: a. A hip or vertebral(clinical or morphometric) fracture b. T-score < -2.5 at the femoral neck or spine after appropriate evaluation to exclude secondary causes c. Low bone mass (T-score between -1.0 and -2.5 at the femoral neck or  spine) and a 10-year probability of a hip fracture > 3% or a 10-year probability of a major osteoporosis-related fracture > 20% based on the [place]-adapted WHO algorithm d. Clinician judgment and/or patient preferences may indicate treatment for people with 10-year fracture probabilities above or below these levels FOLLOW-UP: People with diagnosed cases of osteoporosis or at high risk for fracture should have regular bone mineral density tests. For patients eligible for Medicare, routine testing is allowed once every 2 years. The testing frequency can be increased to one year for patients who have rapidly progressing disease, those who are receiving or discontinuing medical therapy to restore bone mass, or have additional risk factors. I have reviewed this report, and agree with the above findings. [facility] Electronically Signed   By: Earle Gell M.D.   On: 08/19/2018 11:27    Assessment and Plan:   FIDELIS LOTH is a 72 y.o. y/o female with history of diverticular bleeding, last one in December 2018, and history of reflux, not completely resolved here for follow-up  Patient doing well with acid reflux lifestyle modification, and is not on any medications for acid reflux No further diverticular bleeding  High-fiber diet MiraLAX daily with goal of 1-2 soft bowel movements daily.  If not at goal, patient instructed to increase dose to twice daily.  If loose stools with the medication, patient asked to decrease the medication to every other day, or half dose daily.  Patient verbalized understanding  Patient educated extensively on acid reflux lifestyle modification, including buying a bed wedge, not eating 3 hrs before bedtime, diet modifications, and handout given for the same.   Next colonoscopy recommended in 5 years this was discussed with patient  Follow-up as needed   Dr Holly Green

## 2018-10-28 ENCOUNTER — Other Ambulatory Visit: Payer: Self-pay | Admitting: Family Medicine

## 2018-10-28 DIAGNOSIS — I10 Essential (primary) hypertension: Secondary | ICD-10-CM

## 2018-10-28 NOTE — Telephone Encounter (Signed)
Reviewed med list, last BP and last pulse Rx approved

## 2019-01-04 ENCOUNTER — Telehealth: Payer: Self-pay | Admitting: Family Medicine

## 2019-01-04 NOTE — Telephone Encounter (Signed)
I called the patient to schedule Medicare AWV-S with Nurse Health Advisor, Rice. I was told that she wasn't in at the time, so I said that I would call back at another time.  If the patient calls back, please schedule Medicare Wellness Visit with Nurse Health Advisor. Last AWV 01/24/15.  VDM (DD)

## 2019-01-21 ENCOUNTER — Other Ambulatory Visit: Payer: Self-pay | Admitting: Family Medicine

## 2019-01-21 DIAGNOSIS — E785 Hyperlipidemia, unspecified: Secondary | ICD-10-CM

## 2019-01-21 NOTE — Telephone Encounter (Signed)
Lab Results  Component Value Date   ALT 14 08/10/2018   ALT 16 01/11/2014

## 2019-02-08 ENCOUNTER — Other Ambulatory Visit: Payer: Self-pay

## 2019-02-08 ENCOUNTER — Ambulatory Visit: Payer: Medicare Other | Admitting: Family Medicine

## 2019-02-08 ENCOUNTER — Encounter: Payer: Self-pay | Admitting: Nurse Practitioner

## 2019-02-08 ENCOUNTER — Ambulatory Visit (INDEPENDENT_AMBULATORY_CARE_PROVIDER_SITE_OTHER): Payer: Medicare Other | Admitting: Nurse Practitioner

## 2019-02-08 VITALS — BP 142/88 | HR 70 | Temp 97.6°F | Resp 12 | Ht 66.0 in | Wt 188.6 lb

## 2019-02-08 DIAGNOSIS — I10 Essential (primary) hypertension: Secondary | ICD-10-CM | POA: Diagnosis not present

## 2019-02-08 DIAGNOSIS — R7303 Prediabetes: Secondary | ICD-10-CM

## 2019-02-08 DIAGNOSIS — M810 Age-related osteoporosis without current pathological fracture: Secondary | ICD-10-CM | POA: Diagnosis not present

## 2019-02-08 DIAGNOSIS — Z79899 Other long term (current) drug therapy: Secondary | ICD-10-CM | POA: Diagnosis not present

## 2019-02-08 DIAGNOSIS — J3089 Other allergic rhinitis: Secondary | ICD-10-CM | POA: Diagnosis not present

## 2019-02-08 DIAGNOSIS — J439 Emphysema, unspecified: Secondary | ICD-10-CM

## 2019-02-08 DIAGNOSIS — I700 Atherosclerosis of aorta: Secondary | ICD-10-CM | POA: Diagnosis not present

## 2019-02-08 DIAGNOSIS — E782 Mixed hyperlipidemia: Secondary | ICD-10-CM | POA: Diagnosis not present

## 2019-02-08 MED ORDER — ALENDRONATE SODIUM 70 MG PO TABS: 70.0000 mg | ORAL_TABLET | ORAL | 5 refills | Status: DC

## 2019-02-08 MED ORDER — LORATADINE 10 MG PO TABS: 10.0000 mg | ORAL_TABLET | Freq: Every day | ORAL | 11 refills | Status: DC

## 2019-02-08 MED ORDER — BISOPROLOL-HYDROCHLOROTHIAZIDE 10-6.25 MG PO TABS: 1.0000 | ORAL_TABLET | Freq: Every day | ORAL | 1 refills | Status: DC

## 2019-02-08 MED ORDER — LOVASTATIN 20 MG PO TABS: 20.0000 mg | ORAL_TABLET | Freq: Every day | ORAL | 1 refills | Status: DC

## 2019-02-08 NOTE — Patient Instructions (Signed)
Your goal blood pressure is less than 140 mmHg on top. Try to follow the DASH guidelines (DASH stands for Dietary Approaches to Stop Hypertension) Try to limit the sodium in your diet.  Ideally, consume less than 1.5 grams (less than 1,500mg ) per day. Do not add salt when cooking or at the table.  Check the sodium amount on labels when shopping, and choose items lower in sodium when given a choice. Avoid or limit foods that already contain a lot of sodium. Eat a diet rich in fruits and vegetables and whole grains.

## 2019-02-08 NOTE — Progress Notes (Signed)
Name: Holly Green   MRN: [id number]    DOB: October 28, 1946   Date:02/08/2019       Progress Note  Subjective  Chief Complaint  Chief Complaint  Patient presents with  . Follow-up    HPI  Hypertension & Atherosclerosis  Takes ziac and losartan for blood pressure control, no missed doses takes lovastatin 20mg  nigthly. Denies chest pain, lightheadedness, dizziness, blurry vision,   Emphysema not on any inhalers at this time, states never needed one Denies shortness of breath, endorses occasional cough   Osteoporosis takes fosamax weekly, no falls, no reported history fractures. Takes vitamin D and calcium supplements  Seasonal allergies Takes claritin daily, uses flonase if she needs it for her nasal congestion   Osteoarthritis Received joint injection with moderate relief of bilateral knee pain.   Prediabetes Patient has been using airfryer more and avoiding pork, not eating as many fries.   PHQ2/9: Depression screen [facility] 2/9 02/08/2019 08/10/2018 07/06/2018 06/18/2018 11/17/2017  Decreased Interest 0 0 0 0 0  Down, Depressed, Hopeless 0 0 0 0 0  PHQ - 2 Score 0 0 0 0 0  Altered sleeping 0 - 0 - -  Tired, decreased energy 0 - 0 - -  Change in appetite 0 - 0 - -  Feeling bad or failure about yourself  0 - 0 - -  Trouble concentrating 0 - 0 - -  Moving slowly or fidgety/restless 0 - 0 - -  Suicidal thoughts 0 - 0 - -  PHQ-9 Score 0 - 0 - -  Difficult doing work/chores Not difficult at all - Not difficult at all - -     PHQ reviewed. Negative  Patient Active Problem List   Diagnosis Date Noted  . Obesity (BMI 30.0-34.9) 08/10/2018  . Emphysema of lung ([place]) 08/10/2018  . Allergic rhinitis 08/10/2018  . COAD (chronic obstructive airways disease) ([place]) 08/03/2018  . Primary osteoarthritis of right knee 07/17/2018  . Chondrocalcinosis 07/17/2018  . 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 Anemia 12/17/2017  . Hematochezia   .  Diverticulosis of large intestine without diverticulitis   . GI bleed 11/06/2017  . Left flank pain, chronic 03/07/2017  . Spondylolisthesis at L4-L5 level 03/07/2017  . Abdominal aortic atherosclerosis ([place]) 03/05/2017  . Simple cyst of kidney 03/05/2017  . Benign liver cyst 03/05/2017  . Medication monitoring encounter 11/22/2016  . GERD (gastroesophageal reflux disease) 08/07/2016  . Osteoporosis 05/07/2016  . Cardiac murmur 05/07/2016  . Prediabetes 10/15/2015  . Hyperlipidemia 10/15/2015  . Essential hypertension 10/15/2015  . Diverticulosis of colon, acquired 10/15/2015    Past Medical History:  Diagnosis Date  . Abdominal aortic atherosclerosis ([place]) 03/05/2017   CT scan April 2018  . Arthritis   . Bleeding disorder ([place])   . Diverticulitis 2015  . GERD (gastroesophageal reflux disease)   . History of kidney stones   . History of stomach ulcers   . Hyperlipidemia   . Hypertension   . Osteoporosis   . Pre-diabetes   . Prediabetes 10/15/2015  . Spondylolisthesis at L4-L5 level 03/07/2017    Past Surgical History:  Procedure Laterality Date  . ABDOMINAL HYSTERECTOMY    . BACK SURGERY  2010  . BREAST CYST ASPIRATION Right 1990's  . COLONOSCOPY Left 11/09/2017   Procedure: COLONOSCOPY;  Surgeon: Virgel Manifold, MD;  Location: [facility] ENDOSCOPY;  Service: Endoscopy;  Laterality: Left;  . CYSTO WITH HYDRODISTENSION N/A 08/15/2017   Procedure: CYSTOSCOPY/HYDRODISTENSION;  Surgeon: Pilar Jarvis,  Horald Pollen, MD;  Location: [facility] ORS;  Service: Urology;  Laterality: N/A;  . ESOPHAGOGASTRODUODENOSCOPY Left 11/09/2017   Procedure: ESOPHAGOGASTRODUODENOSCOPY (EGD);  Surgeon: Virgel Manifold, MD;  Location: [facility] ENDOSCOPY;  Service: Endoscopy;  Laterality: Left;  . FOOT SURGERY Right     Social History   Tobacco Use  . Smoking status: Former Research scientist (life sciences)  . Smokeless tobacco: Never Used  . Tobacco comment: teenage years about 1 year  Substance Use Topics  . Alcohol use: No     Alcohol/week: 0.0 standard drinks     Current Outpatient Medications:  .  alendronate (FOSAMAX) 70 MG tablet, Take 1 tablet (70 mg total) by mouth every 7 (seven) days. Take with a full glass of water on an empty stomach., Disp: 4 tablet, Rfl: 5 .  bisoprolol-hydrochlorothiazide (ZIAC) 10-6.25 MG tablet, Take 1 tablet by mouth daily., Disp: 90 tablet, Rfl: 1 .  Calcium Carb-Cholecalciferol (CALCIUM-VITAMIN D) 600-400 MG-UNIT TABS, Take 1 tablet by mouth daily., Disp: , Rfl:  .  loratadine (CLARITIN) 10 MG tablet, Take 1 tablet (10 mg total) by mouth daily., Disp: 30 tablet, Rfl: 11 .  losartan (COZAAR) 25 MG tablet, Take 1 tablet (25 mg total) by mouth daily., Disp: 30 tablet, Rfl: 5 .  lovastatin (MEVACOR) 20 MG tablet, Take 1 tablet (20 mg total) by mouth at bedtime., Disp: 90 tablet, Rfl: 1 .  polyethylene glycol (MIRALAX / GLYCOLAX) packet, Take 17 g by mouth daily., Disp: , Rfl:  .  fluticasone (FLONASE) 50 MCG/ACT nasal spray, Place 2 sprays into both nostrils daily. (Patient not taking: Reported on 02/08/2019), Disp: 16 g, Rfl: 6  Allergies  Allergen Reactions  . Aspirin Other (See Comments)    Diverticulosis    Review of Systems  Constitutional: Negative for chills, fever and malaise/fatigue.  Respiratory: Negative for cough and shortness of breath.   Cardiovascular: Negative for chest pain, palpitations and leg swelling.  Gastrointestinal: Negative for abdominal pain.  Musculoskeletal: Negative for joint pain and myalgias.  Skin: Negative for rash.  Neurological: Negative for dizziness and headaches.  Psychiatric/Behavioral: The patient is not nervous/anxious and does not have insomnia.      No other specific complaints in a complete review of systems (except as listed in HPI above).  Objective  Vitals:   02/08/19 1117 02/08/19 1233  BP: (!) 142/86 (!) 142/88  Pulse: 70   Resp: 12   Temp: 97.6 F (36.4 C)   TempSrc: Oral   SpO2: 95%   Weight: 188 lb 9.6 oz (85.5  kg)   Height: 5\' 6"  (1.676 m)     Body mass index is 30.44 kg/m.  Nursing Note and Vital Signs reviewed.  Physical Exam Vitals signs reviewed.  Constitutional:      Appearance: She is well-developed.  HENT:     Head: Normocephalic and atraumatic.  Neck:     Musculoskeletal: Normal range of motion and neck supple.     Vascular: No carotid bruit.  Cardiovascular:     Heart sounds: Normal heart sounds.  Pulmonary:     Effort: Pulmonary effort is normal.     Breath sounds: Normal breath sounds.  Abdominal:     General: Bowel sounds are normal.     Palpations: Abdomen is soft.     Tenderness: There is no abdominal tenderness.  Musculoskeletal: Normal range of motion.  Skin:    General: Skin is warm and dry.     Capillary Refill: Capillary refill takes less than 2 seconds.  Neurological:     Mental Status: She is alert and oriented to person, place, and time.     GCS: GCS eye subscore is 4. GCS verbal subscore is 5. GCS motor subscore is 6.     Sensory: No sensory deficit.  Psychiatric:        Speech: Speech normal.        Behavior: Behavior normal.        Thought Content: Thought content normal.        Judgment: Judgment normal.        No results found for this or any previous visit (from the past 48 hour(s)).  Assessment & Plan  1. Essential hypertension Dash guidelines discussed, elevated today but due to age more permissive with guidelines, follow up in one month  - bisoprolol-hydrochlorothiazide (ZIAC) 10-6.25 MG tablet; Take 1 tablet by mouth daily.  Dispense: 90 tablet; Refill: 1  2. Mixed hyperlipidemia Discussed diet  - Lipid Profile - lovastatin (MEVACOR) 20 MG tablet; Take 1 tablet (20 mg total) by mouth at bedtime.  Dispense: 90 tablet; Refill: 1  3. Prediabetes Discussed diet  - HgB A1c  4. Pulmonary emphysema, unspecified emphysema type ([place]) stable  5. Osteoporosis, unspecified osteoporosis type, unspecified pathological fracture  presence Continue calcium and vitamin D supplementation  - alendronate (FOSAMAX) 70 MG tablet; Take 1 tablet (70 mg total) by mouth every 7 (seven) days. Take with a full glass of water on an empty stomach.  Dispense: 4 tablet; Refill: 5  6. Allergic rhinitis due to other allergic trigger, unspecified seasonality - loratadine (CLARITIN) 10 MG tablet; Take 1 tablet (10 mg total) by mouth daily.  Dispense: 30 tablet; Refill: 11  7. Abdominal aortic atherosclerosis ([facility]) - Lipid Profile - lovastatin (MEVACOR) 20 MG tablet; Take 1 tablet (20 mg total) by mouth at bedtime.  Dispense: 90 tablet; Refill: 1 - bisoprolol-hydrochlorothiazide (ZIAC) 10-6.25 MG tablet; Take 1 tablet by mouth daily.  Dispense: 90 tablet; Refill: 1  8. Medication management - COMPLETE METABOLIC PANEL WITH GFR   -Red flags and when to present for emergency care or RTC including fever >101.35F, chest pain, shortness of breath, new/worsening/un-resolving symptoms, reviewed with patient at time of visit. Follow up and care instructions discussed and provided in AVS.

## 2019-02-09 LAB — LIPID PANEL
Cholesterol: 185 mg/dL (ref <200)
HDL: 78 mg/dL (ref >=50)
LDL Cholesterol (Calc): 93 mg/dL (calc)
Non-HDL Cholesterol (Calc): 107 mg/dL (calc) (ref <130)
Total CHOL/HDL Ratio: 2.4 (calc) (ref <5.0)
Triglycerides: 55 mg/dL (ref <150)

## 2019-02-09 LAB — COMPLETE METABOLIC PANEL WITH GFR
AG Ratio: 1.5 (calc) (ref 1.0–2.5)
ALT: 18 U/L (ref 6–29)
AST: 31 U/L (ref 10–35)
Albumin: 4.4 g/dL (ref 3.6–5.1)
Alkaline phosphatase (APISO): 74 U/L (ref 37–153)
BUN: 15 mg/dL (ref 7–25)
CO2: 26 mmol/L (ref 20–32)
Calcium: 9.5 mg/dL (ref 8.6–10.4)
Chloride: 102 mmol/L (ref 98–110)
Creat: 0.65 mg/dL (ref 0.60–0.93)
GFR, Est African American: 104 mL/min/{1.73_m2} (ref >=60)
GFR, Est Non African American: 89 mL/min/{1.73_m2} (ref >=60)
Globulin: 2.9 g/dL (calc) (ref 1.9–3.7)
Glucose, Bld: 80 mg/dL (ref 65–99)
Potassium: 5 mmol/L (ref 3.5–5.3)
Sodium: 138 mmol/L (ref 135–146)
Total Bilirubin: 0.9 mg/dL (ref 0.2–1.2)
Total Protein: 7.3 g/dL (ref 6.1–8.1)

## 2019-02-09 LAB — HEMOGLOBIN A1C
Hgb A1c MFr Bld: 5.9 % of total Hgb — ABNORMAL HIGH (ref <5.7)
Mean Plasma Glucose: 123 (calc)
eAG (mmol/L): 6.8 (calc)

## 2019-02-28 ENCOUNTER — Other Ambulatory Visit: Payer: Self-pay | Admitting: Family Medicine

## 2019-02-28 NOTE — Telephone Encounter (Signed)
Lab Results  Component Value Date   CREATININE 0.65 02/08/2019   Lab Results  Component Value Date   K 5.0 02/08/2019

## 2019-03-11 ENCOUNTER — Ambulatory Visit (INDEPENDENT_AMBULATORY_CARE_PROVIDER_SITE_OTHER): Payer: Medicare Other | Admitting: Family Medicine

## 2019-03-11 ENCOUNTER — Encounter: Payer: Self-pay | Admitting: Nurse Practitioner

## 2019-03-11 ENCOUNTER — Other Ambulatory Visit: Payer: Self-pay

## 2019-03-11 VITALS — BP 134/80

## 2019-03-11 DIAGNOSIS — I10 Essential (primary) hypertension: Secondary | ICD-10-CM | POA: Diagnosis not present

## 2019-03-11 DIAGNOSIS — E782 Mixed hyperlipidemia: Secondary | ICD-10-CM | POA: Diagnosis not present

## 2019-03-11 MED ORDER — LOVASTATIN 40 MG PO TABS: 40.0000 mg | ORAL_TABLET | Freq: Every day | ORAL | 3 refills | Status: DC

## 2019-03-11 NOTE — Progress Notes (Signed)
Name: Holly Green   MRN: [id number]    DOB: May 25, 1946   Date:03/11/2019       Progress Note  Subjective  Chief Complaint  Chief Complaint  Patient presents with  . Follow-up    1 month   . Medication Refill    lovastatin rx will run out early since she has to take a double dose    I connected with  [name] on 03/11/19 at  9:00 AM EDT by telephone and verified that I am speaking with the correct person using two identifiers.   I discussed the limitations, risks, security and privacy concerns of performing an evaluation and management service by telephone and the availability of in person appointments. Staff also discussed with the patient that there may be a patient responsible charge related to this service. Patient Location: Home Provider Location: Office Additional Individuals present: Home  HPI  Pt presents for follow up on HTN and HLD:  HTN: BP at home today is 134//80. Denies headaches, blurred vision, chest pain, shortness of breath, or BLE edema.  She is taking Losartan 25mg , and Ziac 10-6.25mg .  She is doing well on her current dosing of her medication without lightheadedness or dizziness.  HLD: On higher dose of lovastatin - on 40mg  now for about a month.  No chest pain, shortness of breath, or myalgias. She is cooking more with airfryer instead of deep frying, not eating out much right now.   Patient Active Problem List   Diagnosis Date Noted  . Obesity (BMI 30.0-34.9) 08/10/2018  . Emphysema of lung ([place]) 08/10/2018  . Allergic rhinitis 08/10/2018  . COAD (chronic obstructive airways disease) ([place]) 08/03/2018  . Primary osteoarthritis of right knee 07/17/2018  . Chondrocalcinosis 07/17/2018  . 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 Anemia 12/17/2017  . Hematochezia   . Diverticulosis of large intestine without diverticulitis   . GI bleed 11/06/2017  . Left flank pain, chronic 03/07/2017  . Spondylolisthesis at  L4-L5 level 03/07/2017  . Abdominal aortic atherosclerosis ([place]) 03/05/2017  . Simple cyst of kidney 03/05/2017  . Benign liver cyst 03/05/2017  . Medication monitoring encounter 11/22/2016  . GERD (gastroesophageal reflux disease) 08/07/2016  . Osteoporosis 05/07/2016  . Cardiac murmur 05/07/2016  . Prediabetes 10/15/2015  . Hyperlipidemia 10/15/2015  . Essential hypertension 10/15/2015  . Diverticulosis of colon, acquired 10/15/2015    Social History   Tobacco Use  . Smoking status: Former Research scientist (life sciences)  . Smokeless tobacco: Never Used  . Tobacco comment: teenage years about 1 year  Substance Use Topics  . Alcohol use: No    Alcohol/week: 0.0 standard drinks     Current Outpatient Medications:  .  alendronate (FOSAMAX) 70 MG tablet, Take 1 tablet (70 mg total) by mouth every 7 (seven) days. Take with a full glass of water on an empty stomach., Disp: 4 tablet, Rfl: 5 .  bisoprolol-hydrochlorothiazide (ZIAC) 10-6.25 MG tablet, Take 1 tablet by mouth daily., Disp: 90 tablet, Rfl: 1 .  Calcium Carb-Cholecalciferol (CALCIUM-VITAMIN D) 600-400 MG-UNIT TABS, Take 1 tablet by mouth daily., Disp: , Rfl:  .  loratadine (CLARITIN) 10 MG tablet, Take 1 tablet (10 mg total) by mouth daily., Disp: 30 tablet, Rfl: 11 .  losartan (COZAAR) 25 MG tablet, Take 1 tablet by mouth once daily, Disp: 90 tablet, Rfl: 0 .  lovastatin (MEVACOR) 20 MG tablet, Take 1 tablet (20 mg total) by mouth at bedtime., Disp: 90 tablet, Rfl: 1 .  polyethylene glycol (MIRALAX / GLYCOLAX) packet, Take 17 g by mouth daily., Disp: , Rfl:  .  fluticasone (FLONASE) 50 MCG/ACT nasal spray, Place 2 sprays into both nostrils daily. (Patient not taking: Reported on 02/08/2019), Disp: 16 g, Rfl: 6  Allergies  Allergen Reactions  . Aspirin Other (See Comments)    Diverticulosis    I personally reviewed active problem list, medication list, allergies, notes from last encounter, lab results with the patient/caregiver today.  ROS   Constitutional: Negative for fever or weight change.  Respiratory: Negative for cough and shortness of breath.   Cardiovascular: Negative for chest pain or palpitations.  Gastrointestinal: Negative for abdominal pain, no bowel changes.  Musculoskeletal: Negative for gait problem or joint swelling.  Skin: Negative for rash.  Neurological: Negative for dizziness or headache.  No other specific complaints in a complete review of systems (except as listed in HPI above).  Objective  Virtual encounter, vitals not obtained.  There is no height or weight on file to calculate BMI.  Nursing Note and Vital Signs reviewed.  Physical Exam  Pulmonary/Chest: Effort normal. No respiratory distress. Speaking in complete sentences Neurological: Pt is alert and oriented to person, place, and time. Speech is normal Psychiatric: Patient has a normal mood and affect. behavior is normal. Judgment and thought content normal.  No results found for this or any previous visit (from the past 72 hour(s)).  Assessment & Plan  1. Essential hypertension - Stable at current dose per home BP cuff.  Continue current regimen  2. Mixed hyperlipidemia - lovastatin (MEVACOR) 40 MG tablet; Take 1 tablet (40 mg total) by mouth at bedtime.  Dispense: 90 tablet; Refill: 3   -Red flags and when to present for emergency care or RTC including fever >101.34F, chest pain, shortness of breath, new/worsening/un-resolving symptoms, reviewed with patient at time of visit. Follow up and care instructions discussed and provided in AVS. - I discussed the assessment and treatment plan with the patient. The patient was provided an opportunity to ask questions and all were answered. The patient agreed with the plan and demonstrated an understanding of the instructions.  - The patient was advised to call back or seek an in-person evaluation if the symptoms worsen or if the condition fails to improve as anticipated.  I provided 11  minutes of non-face-to-face time during this encounter.  Hubbard Hartshorn, FNP

## 2019-03-11 NOTE — Patient Instructions (Signed)
Fat and Cholesterol Restricted Eating Plan Getting too much fat and cholesterol in your diet may cause health problems. Choosing the right foods helps keep your fat and cholesterol at normal levels. This can keep you from getting certain diseases. Your doctor may recommend an eating plan that includes:  Total fat: ______% or less of total calories a day.  Saturated fat: ______% or less of total calories a day.  Cholesterol: less than _________mg a day.  Fiber: ______g a day. What are tips for following this plan? Meal planning  At meals, divide your plate into four equal parts: ? Fill one-half of your plate with vegetables and green salads. ? Fill one-fourth of your plate with whole grains. ? Fill one-fourth of your plate with low-fat (lean) protein foods.  Eat fish that is high in omega-3 fats at least two times a week. This includes mackerel, tuna, sardines, and salmon.  Eat foods that are high in fiber, such as whole grains, beans, apples, broccoli, carrots, peas, and barley. General tips   Work with your doctor to lose weight if you need to.  Avoid: ? Foods with added sugar. ? Fried foods. ? Foods with partially hydrogenated oils.  Limit alcohol intake to no more than 1 drink a day for nonpregnant women and 2 drinks a day for men. One drink equals 12 oz of beer, 5 oz of wine, or 1 oz of hard liquor. Reading food labels  Check food labels for: ? Trans fats. ? Partially hydrogenated oils. ? Saturated fat (g) in each serving. ? Cholesterol (mg) in each serving. ? Fiber (g) in each serving.  Choose foods with healthy fats, such as: ? Monounsaturated fats. ? Polyunsaturated fats. ? Omega-3 fats.  Choose grain products that have whole grains. Look for the word "whole" as the first word in the ingredient list. Cooking  Cook foods using low-fat methods. These include baking, boiling, grilling, and broiling.  Eat more home-cooked foods. Eat at restaurants and buffets  less often.  Avoid cooking using saturated fats, such as butter, cream, palm oil, palm kernel oil, and coconut oil. Recommended foods  Fruits  All fresh, canned (in natural juice), or frozen fruits. Vegetables  Fresh or frozen vegetables (raw, steamed, roasted, or grilled). Green salads. Grains  Whole grains, such as whole wheat or whole grain breads, crackers, cereals, and pasta. Unsweetened oatmeal, bulgur, barley, quinoa, or brown rice. Corn or whole wheat flour tortillas. Meats and other protein foods  Ground beef (85% or leaner), grass-fed beef, or beef trimmed of fat. Skinless chicken or turkey. Ground chicken or turkey. Pork trimmed of fat. All fish and seafood. Egg whites. Dried beans, peas, or lentils. Unsalted nuts or seeds. Unsalted canned beans. Nut butters without added sugar or oil. Dairy  Low-fat or nonfat dairy products, such as skim or 1% milk, 2% or reduced-fat cheeses, low-fat and fat-free ricotta or cottage cheese, or plain low-fat and nonfat yogurt. Fats and oils  Tub margarine without trans fats. Light or reduced-fat mayonnaise and salad dressings. Avocado. Olive, canola, sesame, or safflower oils. The items listed above may not be a complete list of foods and beverages you can eat. Contact a dietitian for more information. Foods to avoid Fruits  Canned fruit in heavy syrup. Fruit in cream or butter sauce. Fried fruit. Vegetables  Vegetables cooked in cheese, cream, or butter sauce. Fried vegetables. Grains  White bread. White pasta. White rice. Cornbread. Bagels, pastries, and croissants. Crackers and snack foods that contain trans fat   and hydrogenated oils. Meats and other protein foods  Fatty cuts of meat. Ribs, chicken wings, bacon, sausage, bologna, salami, chitterlings, fatback, hot dogs, bratwurst, and packaged lunch meats. Liver and organ meats. Whole eggs and egg yolks. Chicken and turkey with skin. Fried meat. Dairy  Whole or 2% milk, cream,  half-and-half, and cream cheese. Whole milk cheeses. Whole-fat or sweetened yogurt. Full-fat cheeses. Nondairy creamers and whipped toppings. Processed cheese, cheese spreads, and cheese curds. Beverages  Alcohol. Sugar-sweetened drinks such as sodas, lemonade, and fruit drinks. Fats and oils  Butter, stick margarine, lard, shortening, ghee, or bacon fat. Coconut, palm kernel, and palm oils. Sweets and desserts  Corn syrup, sugars, honey, and molasses. Candy. Jam and jelly. Syrup. Sweetened cereals. Cookies, pies, cakes, donuts, muffins, and ice cream. The items listed above may not be a complete list of foods and beverages you should avoid. Contact a dietitian for more information. Summary  Choosing the right foods helps keep your fat and cholesterol at normal levels. This can keep you from getting certain diseases.  At meals, fill one-half of your plate with vegetables and green salads.  Eat high-fiber foods, like whole grains, beans, apples, carrots, peas, and barley.  Limit added sugar, saturated fats, alcohol, and fried foods. This information is not intended to replace advice given to you by your health care provider. Make sure you discuss any questions you have with your health care provider. Document Released: 05/12/2012 Document Revised: 07/15/2018 Document Reviewed: 07/29/2017 Elsevier Interactive Patient Education  2019 Elsevier Inc.   DASH Eating Plan DASH stands for "Dietary Approaches to Stop Hypertension." The DASH eating plan is a healthy eating plan that has been shown to reduce high blood pressure (hypertension). It may also reduce your risk for type 2 diabetes, heart disease, and stroke. The DASH eating plan may also help with weight loss. What are tips for following this plan?  General guidelines  Avoid eating more than 2,300 mg (milligrams) of salt (sodium) a day. If you have hypertension, you may need to reduce your sodium intake to 1,500 mg a day.  Limit  alcohol intake to no more than 1 drink a day for nonpregnant women and 2 drinks a day for men. One drink equals 12 oz of beer, 5 oz of wine, or 1 oz of hard liquor.  Work with your health care provider to maintain a healthy body weight or to lose weight. Ask what an ideal weight is for you.  Get at least 30 minutes of exercise that causes your heart to beat faster (aerobic exercise) most days of the week. Activities may include walking, swimming, or biking.  Work with your health care provider or diet and nutrition specialist (dietitian) to adjust your eating plan to your individual calorie needs. Reading food labels   Check food labels for the amount of sodium per serving. Choose foods with less than 5 percent of the Daily Value of sodium. Generally, foods with less than 300 mg of sodium per serving fit into this eating plan.  To find whole grains, look for the word "whole" as the first word in the ingredient list. Shopping  Buy products labeled as "low-sodium" or "no salt added."  Buy fresh foods. Avoid canned foods and premade or frozen meals. Cooking  Avoid adding salt when cooking. Use salt-free seasonings or herbs instead of table salt or sea salt. Check with your health care provider or pharmacist before using salt substitutes.  Do not fry foods. Cook foods using healthy   methods such as baking, boiling, grilling, and broiling instead.  Cook with heart-healthy oils, such as olive, canola, soybean, or sunflower oil. Meal planning  Eat a balanced diet that includes: ? 5 or more servings of fruits and vegetables each day. At each meal, try to fill half of your plate with fruits and vegetables. ? Up to 6-8 servings of whole grains each day. ? Less than 6 oz of lean meat, poultry, or fish each day. A 3-oz serving of meat is about the same size as a deck of cards. One egg equals 1 oz. ? 2 servings of low-fat dairy each day. ? A serving of nuts, seeds, or beans 5 times each  week. ? Heart-healthy fats. Healthy fats called Omega-3 fatty acids are found in foods such as flaxseeds and coldwater fish, like sardines, salmon, and mackerel.  Limit how much you eat of the following: ? Canned or prepackaged foods. ? Food that is high in trans fat, such as fried foods. ? Food that is high in saturated fat, such as fatty meat. ? Sweets, desserts, sugary drinks, and other foods with added sugar. ? Full-fat dairy products.  Do not salt foods before eating.  Try to eat at least 2 vegetarian meals each week.  Eat more home-cooked food and less restaurant, buffet, and fast food.  When eating at a restaurant, ask that your food be prepared with less salt or no salt, if possible. What foods are recommended? The items listed may not be a complete list. Talk with your dietitian about what dietary choices are best for you. Grains Whole-grain or whole-wheat bread. Whole-grain or whole-wheat pasta. Brown rice. Oatmeal. Quinoa. Bulgur. Whole-grain and low-sodium cereals. Pita bread. Low-fat, low-sodium crackers. Whole-wheat flour tortillas. Vegetables Fresh or frozen vegetables (raw, steamed, roasted, or grilled). Low-sodium or reduced-sodium tomato and vegetable juice. Low-sodium or reduced-sodium tomato sauce and tomato paste. Low-sodium or reduced-sodium canned vegetables. Fruits All fresh, dried, or frozen fruit. Canned fruit in natural juice (without added sugar). Meat and other protein foods Skinless chicken or turkey. Ground chicken or turkey. Pork with fat trimmed off. Fish and seafood. Egg whites. Dried beans, peas, or lentils. Unsalted nuts, nut butters, and seeds. Unsalted canned beans. Lean cuts of beef with fat trimmed off. Low-sodium, lean deli meat. Dairy Low-fat (1%) or fat-free (skim) milk. Fat-free, low-fat, or reduced-fat cheeses. Nonfat, low-sodium ricotta or cottage cheese. Low-fat or nonfat yogurt. Low-fat, low-sodium cheese. Fats and oils Soft margarine  without trans fats. Vegetable oil. Low-fat, reduced-fat, or light mayonnaise and salad dressings (reduced-sodium). Canola, safflower, olive, soybean, and sunflower oils. Avocado. Seasoning and other foods Herbs. Spices. Seasoning mixes without salt. Unsalted popcorn and pretzels. Fat-free sweets. What foods are not recommended? The items listed may not be a complete list. Talk with your dietitian about what dietary choices are best for you. Grains Baked goods made with fat, such as croissants, muffins, or some breads. Dry pasta or rice meal packs. Vegetables Creamed or fried vegetables. Vegetables in a cheese sauce. Regular canned vegetables (not low-sodium or reduced-sodium). Regular canned tomato sauce and paste (not low-sodium or reduced-sodium). Regular tomato and vegetable juice (not low-sodium or reduced-sodium). Pickles. Olives. Fruits Canned fruit in a light or heavy syrup. Fried fruit. Fruit in cream or butter sauce. Meat and other protein foods Fatty cuts of meat. Ribs. Fried meat. Bacon. Sausage. Bologna and other processed lunch meats. Salami. Fatback. Hotdogs. Bratwurst. Salted nuts and seeds. Canned beans with added salt. Canned or smoked fish. Whole eggs   or egg yolks. Chicken or turkey with skin. Dairy Whole or 2% milk, cream, and half-and-half. Whole or full-fat cream cheese. Whole-fat or sweetened yogurt. Full-fat cheese. Nondairy creamers. Whipped toppings. Processed cheese and cheese spreads. Fats and oils Butter. Stick margarine. Lard. Shortening. Ghee. Bacon fat. Tropical oils, such as coconut, palm kernel, or palm oil. Seasoning and other foods Salted popcorn and pretzels. Onion salt, garlic salt, seasoned salt, table salt, and sea salt. Worcestershire sauce. Tartar sauce. Barbecue sauce. Teriyaki sauce. Soy sauce, including reduced-sodium. Steak sauce. Canned and packaged gravies. Fish sauce. Oyster sauce. Cocktail sauce. Horseradish that you find on the shelf. Ketchup.  Mustard. Meat flavorings and tenderizers. Bouillon cubes. Hot sauce and Tabasco sauce. Premade or packaged marinades. Premade or packaged taco seasonings. Relishes. Regular salad dressings. Where to find more information:  National Heart, Lung, and Blood Institute: www.nhlbi.nih.gov  American Heart Association: www.heart.org Summary  The DASH eating plan is a healthy eating plan that has been shown to reduce high blood pressure (hypertension). It may also reduce your risk for type 2 diabetes, heart disease, and stroke.  With the DASH eating plan, you should limit salt (sodium) intake to 2,300 mg a day. If you have hypertension, you may need to reduce your sodium intake to 1,500 mg a day.  When on the DASH eating plan, aim to eat more fresh fruits and vegetables, whole grains, lean proteins, low-fat dairy, and heart-healthy fats.  Work with your health care provider or diet and nutrition specialist (dietitian) to adjust your eating plan to your individual calorie needs. This information is not intended to replace advice given to you by your health care provider. Make sure you discuss any questions you have with your health care provider. Document Released: 10/31/2011 Document Revised: 11/04/2016 Document Reviewed: 11/04/2016 Elsevier Interactive Patient Education  2019 Elsevier Inc.   

## 2019-03-15 DIAGNOSIS — M1711 Unilateral primary osteoarthritis, right knee: Secondary | ICD-10-CM | POA: Diagnosis not present

## 2019-03-15 DIAGNOSIS — M1120 Other chondrocalcinosis, unspecified site: Secondary | ICD-10-CM | POA: Diagnosis not present

## 2019-04-13 NOTE — Progress Notes (Signed)
Lvm to call and schedule follow up per Raquel Sarna

## 2019-05-24 ENCOUNTER — Other Ambulatory Visit: Payer: Self-pay | Admitting: Family Medicine

## 2019-05-24 DIAGNOSIS — Z1231 Encounter for screening mammogram for malignant neoplasm of breast: Secondary | ICD-10-CM

## 2019-06-03 ENCOUNTER — Telehealth: Payer: Self-pay | Admitting: Family Medicine

## 2019-06-03 NOTE — Telephone Encounter (Signed)
lvm for pt to call and schedule an appt °

## 2019-06-03 NOTE — Telephone Encounter (Signed)
Please schedule patient for follow up in the next 30 days.  

## 2019-06-10 ENCOUNTER — Ambulatory Visit: Payer: Medicare Other | Admitting: Family Medicine

## 2019-06-14 ENCOUNTER — Ambulatory Visit (INDEPENDENT_AMBULATORY_CARE_PROVIDER_SITE_OTHER): Payer: Medicare Other | Admitting: Nurse Practitioner

## 2019-06-14 ENCOUNTER — Other Ambulatory Visit: Payer: Self-pay

## 2019-06-14 ENCOUNTER — Encounter: Payer: Self-pay | Admitting: Nurse Practitioner

## 2019-06-14 VITALS — BP 126/76 | HR 64

## 2019-06-14 DIAGNOSIS — E782 Mixed hyperlipidemia: Secondary | ICD-10-CM | POA: Diagnosis not present

## 2019-06-14 DIAGNOSIS — M810 Age-related osteoporosis without current pathological fracture: Secondary | ICD-10-CM | POA: Diagnosis not present

## 2019-06-14 DIAGNOSIS — M1711 Unilateral primary osteoarthritis, right knee: Secondary | ICD-10-CM | POA: Diagnosis not present

## 2019-06-14 DIAGNOSIS — J439 Emphysema, unspecified: Secondary | ICD-10-CM | POA: Diagnosis not present

## 2019-06-14 DIAGNOSIS — J3089 Other allergic rhinitis: Secondary | ICD-10-CM

## 2019-06-14 DIAGNOSIS — R300 Dysuria: Secondary | ICD-10-CM | POA: Diagnosis not present

## 2019-06-14 DIAGNOSIS — I10 Essential (primary) hypertension: Secondary | ICD-10-CM | POA: Diagnosis not present

## 2019-06-14 DIAGNOSIS — I700 Atherosclerosis of aorta: Secondary | ICD-10-CM

## 2019-06-14 DIAGNOSIS — R7303 Prediabetes: Secondary | ICD-10-CM

## 2019-06-14 NOTE — Progress Notes (Signed)
Virtual Visit via Telephone Note  I connected with Holly Green on 06/14/19 at  1:20 PM EDT by telephone and verified that I am speaking with the correct person using two identifiers.   Staff discussed the limitations, risks, security and privacy concerns of performing an evaluation and management service by telephone and the availability of in person appointments. Staff also discussed with the patient that there may be a patient responsible charge related to this service. The patient expressed understanding and agreed to proceed.  Patients location: home  My location: work office  Other people in meeting: none    HPI  Patient endorses dysuria intermittently for the past 2 weeks, states she talked to pharmacists and took AZO and has relief of symptoms. Denies fevers, chills, vaginal discharge, rash. Urine is clear.   Hypertension & Atherosclerosis  Takes bisoprolol 10mg  daily and HCTZ 6.25mg  daily and losartan 25mg  daily for blood pressure control, no missed doses. takes lovastatin 20mg  nigthly. She checks her blood pressures at home, typically well-controlled but usually has one reading at least above 140 on the top.  Drinks at least 60 ounces of water a day. Occasionally eats fried foods, avoids red meats. Limits sodium intake. Uses airfryer.  Denies chest pain, lightheadedness, dizziness, blurry vision,  BP Readings from Last 3 Encounters:  06/14/19 126/76  03/11/19 134/80  02/08/19 (!) 142/88   Lab Results  Component Value Date   CHOL 185 02/08/2019   HDL 78 02/08/2019   LDLCALC 93 02/08/2019   TRIG 55 02/08/2019   CHOLHDL 2.4 02/08/2019    Emphysema not on any inhalers at this time, states never needed one. Former smoker- states was just social smoker in her youth maybe a year or two.  Denies shortness of breath, endorses occasional cough- states it is much improved after stopping ACEi  Osteoporosis takes fosamax weekly, no falls, no history fractures. Takes vitamin D  and calcium supplements. States she walks at work and cleans around the house but nothing routine.  DEXA scan- 07/2018 shows osteoporosis   Seasonal allergies Takes claritin daily, uses flonase if she needs it for her nasal congestion.  Osteoarthritis Received joint injection over 6 months ago with moderate relief of bilateral knee pain in the past. Has follow-up with dr. Annalee Genta tomorrow due to increasing pain.  Prediabetes Patient has been using airfryer more and avoiding pork, not eating as many fries.  Denies polyphagia, polydipsia, polyuria.  Lab Results  Component Value Date   HGBA1C 5.9 (H) 02/08/2019    PHQ2/9: Depression screen [facility] 2/9 06/14/2019 03/11/2019 02/08/2019 08/10/2018 07/06/2018  Decreased Interest 0 0 0 0 0  Down, Depressed, Hopeless 0 0 0 0 0  PHQ - 2 Score 0 0 0 0 0  Altered sleeping 0 0 0 - 0  Tired, decreased energy 0 0 0 - 0  Change in appetite 0 0 0 - 0  Feeling bad or failure about yourself  0 0 0 - 0  Trouble concentrating 0 0 0 - 0  Moving slowly or fidgety/restless 0 0 0 - 0  Suicidal thoughts 0 0 0 - 0  PHQ-9 Score 0 0 0 - 0  Difficult doing work/chores Not difficult at all Not difficult at all Not difficult at all - Not difficult at all     PHQ reviewed. Negative  Patient Active Problem List   Diagnosis Date Noted  . Obesity (BMI 30.0-34.9) 08/10/2018  . Emphysema of lung ([place]) 08/10/2018  . Allergic rhinitis 08/10/2018  .  COAD (chronic obstructive airways disease) ([place]) 08/03/2018  . Primary osteoarthritis of right knee 07/17/2018  . Chondrocalcinosis 07/17/2018  . 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 Anemia 12/17/2017  . Hematochezia   . Diverticulosis of large intestine without diverticulitis   . GI bleed 11/06/2017  . Left flank pain, chronic 03/07/2017  . Spondylolisthesis at L4-L5 level 03/07/2017  . Abdominal aortic atherosclerosis ([place]) 03/05/2017  . Simple cyst of kidney 03/05/2017   . Benign liver cyst 03/05/2017  . Medication monitoring encounter 11/22/2016  . GERD (gastroesophageal reflux disease) 08/07/2016  . Osteoporosis 05/07/2016  . Cardiac murmur 05/07/2016  . Prediabetes 10/15/2015  . Hyperlipidemia 10/15/2015  . Essential hypertension 10/15/2015  . Diverticulosis of colon, acquired 10/15/2015    Past Medical History:  Diagnosis Date  . Abdominal aortic atherosclerosis ([place]) 03/05/2017   CT scan April 2018  . Arthritis   . Bleeding disorder ([place])   . Diverticulitis 2015  . GERD (gastroesophageal reflux disease)   . History of kidney stones   . History of stomach ulcers   . Hyperlipidemia   . Hypertension   . Osteoporosis   . Pre-diabetes   . Prediabetes 10/15/2015  . Spondylolisthesis at L4-L5 level 03/07/2017    Past Surgical History:  Procedure Laterality Date  . ABDOMINAL HYSTERECTOMY    . BACK SURGERY  2010  . BREAST CYST ASPIRATION Right 1990's  . COLONOSCOPY Left 11/09/2017   Procedure: COLONOSCOPY;  Surgeon: Virgel Manifold, MD;  Location: [facility] ENDOSCOPY;  Service: Endoscopy;  Laterality: Left;  . CYSTO WITH HYDRODISTENSION N/A 08/15/2017   Procedure: CYSTOSCOPY/HYDRODISTENSION;  Surgeon: Nickie Retort, MD;  Location: [facility] ORS;  Service: Urology;  Laterality: N/A;  . ESOPHAGOGASTRODUODENOSCOPY Left 11/09/2017   Procedure: ESOPHAGOGASTRODUODENOSCOPY (EGD);  Surgeon: Virgel Manifold, MD;  Location: [facility] ENDOSCOPY;  Service: Endoscopy;  Laterality: Left;  . FOOT SURGERY Right     Social History   Tobacco Use  . Smoking status: Former Research scientist (life sciences)  . Smokeless tobacco: Never Used  . Tobacco comment: teenage years about 1 year  Substance Use Topics  . Alcohol use: No    Alcohol/week: 0.0 standard drinks     Current Outpatient Medications:  .  alendronate (FOSAMAX) 70 MG tablet, Take 1 tablet (70 mg total) by mouth every 7 (seven) days. Take with a full glass of water on an empty stomach., Disp: 4 tablet, Rfl: 5 .   bisoprolol-hydrochlorothiazide (ZIAC) 10-6.25 MG tablet, Take 1 tablet by mouth daily., Disp: 90 tablet, Rfl: 1 .  Calcium Carb-Cholecalciferol (CALCIUM-VITAMIN D) 600-400 MG-UNIT TABS, Take 1 tablet by mouth daily., Disp: , Rfl:  .  loratadine (CLARITIN) 10 MG tablet, Take 1 tablet (10 mg total) by mouth daily., Disp: 30 tablet, Rfl: 11 .  losartan (COZAAR) 25 MG tablet, Take 1 tablet by mouth once daily, Disp: 90 tablet, Rfl: 0 .  lovastatin (MEVACOR) 40 MG tablet, Take 1 tablet (40 mg total) by mouth at bedtime., Disp: 90 tablet, Rfl: 3 .  polyethylene glycol (MIRALAX / GLYCOLAX) packet, Take 17 g by mouth daily., Disp: , Rfl:  .  fluticasone (FLONASE) 50 MCG/ACT nasal spray, Place 2 sprays into both nostrils daily. (Patient not taking: Reported on 06/14/2019), Disp: 16 g, Rfl: 6  Allergies  Allergen Reactions  . Aspirin Other (See Comments)    Diverticulosis    ROS    No other specific complaints in a complete review of systems (except as listed in HPI above).  Objective  Vitals:   06/14/19 1315  BP: 126/76  Pulse: 64     There is no height or weight on file to calculate BMI.  Patient is alert, able to speak in full sentences without difficulty.   Assessment & Plan  1. Essential hypertension Stable continue meds  2. Abdominal aortic atherosclerosis (HCC) BP and lipid control   3. Pulmonary emphysema, unspecified emphysema type ([place]) stable  4. Age-related osteoporosis without current pathological fracture Continue fosamax, follow-up dexa in 1 year   5. Mixed hyperlipidemia Stable, continue meds  6. Allergic rhinitis due to other allergic trigger, unspecified seasonality Continue claritin   7. Primary osteoarthritis of right knee Follow-up with specialist   8. Prediabetes Discussed diet.   9. Dysuria Patient will drop of urine sample for testing.     I discussed the assessment and treatment plan with the patient. The patient was provided an opportunity  to ask questions and all were answered. The patient agreed with the plan and demonstrated an understanding of the instructions.   The patient was advised to call back or seek an in-person evaluation if the symptoms worsen or if the condition fails to improve as anticipated.  I provided 24 minutes of non-face-to-face time during this encounter.   Fredderick Severance, NP

## 2019-06-15 ENCOUNTER — Other Ambulatory Visit: Payer: Self-pay | Admitting: Nurse Practitioner

## 2019-06-15 ENCOUNTER — Other Ambulatory Visit (INDEPENDENT_AMBULATORY_CARE_PROVIDER_SITE_OTHER): Payer: Medicare Other

## 2019-06-15 DIAGNOSIS — R300 Dysuria: Secondary | ICD-10-CM

## 2019-06-15 DIAGNOSIS — M1711 Unilateral primary osteoarthritis, right knee: Secondary | ICD-10-CM | POA: Diagnosis not present

## 2019-06-15 DIAGNOSIS — M1120 Other chondrocalcinosis, unspecified site: Secondary | ICD-10-CM | POA: Diagnosis not present

## 2019-06-15 LAB — POCT URINALYSIS DIPSTICK
Bilirubin, UA: NEGATIVE
Glucose, UA: NEGATIVE
Ketones, UA: NEGATIVE
Nitrite, UA: NEGATIVE
Protein, UA: NEGATIVE
Spec Grav, UA: 1.015 (ref 1.010–1.025)
Urobilinogen, UA: NEGATIVE E.U./dL — AB
pH, UA: 5 (ref 5.0–8.0)

## 2019-06-15 MED ORDER — NITROFURANTOIN MONOHYD MACRO 100 MG PO CAPS: 100.0000 mg | ORAL_CAPSULE | Freq: Two times a day (BID) | ORAL | 0 refills | Status: DC

## 2019-06-18 ENCOUNTER — Other Ambulatory Visit: Payer: Self-pay | Admitting: Family Medicine

## 2019-06-18 DIAGNOSIS — N3000 Acute cystitis without hematuria: Secondary | ICD-10-CM

## 2019-06-18 LAB — URINE CULTURE
MICRO NUMBER:: 689375
SPECIMEN QUALITY:: ADEQUATE

## 2019-06-18 MED ORDER — SULFAMETHOXAZOLE-TRIMETHOPRIM 800-160 MG PO TABS: 1.0000 | ORAL_TABLET | Freq: Two times a day (BID) | ORAL | 0 refills | Status: AC

## 2019-07-02 ENCOUNTER — Other Ambulatory Visit: Payer: Self-pay

## 2019-07-02 ENCOUNTER — Ambulatory Visit
Admission: RE | Admit: 2019-07-02 | Discharge: 2019-07-02 | Disposition: A | Discharge status: Home / Self Care | Payer: Medicare Other | Source: Ambulatory Visit | Attending: Family Medicine | Admitting: Family Medicine

## 2019-07-02 DIAGNOSIS — Z1231 Encounter for screening mammogram for malignant neoplasm of breast: Secondary | ICD-10-CM | POA: Diagnosis not present

## 2019-08-05 NOTE — Progress Notes (Signed)
[facility] Consultation      Assessment and Plan:  Chronic bronchitis with COPD, emphysema. -Excess mucus production, which appears to have improved. - Evidence of hyperinflation on imaging and pulmonary function testing, consistent with a diagnosis of emphysema.  Currently she appears minimally symptomatic from this, therefore I do not think that she needs an inhaler at this time.  She is advised that if she does develop dyspnea or excess mucus production to call us and we can consider starting an inhaler. 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 - PCV 13 08/06/2018.  Patient is getting get her flu shot from her primary care physician.  Chronic rhinitis. - Patient has symptoms of chronic rhinitis, this had improved with Flonase and Claritin.  She has stopped the Claritin with continued symptoms. - Trial off Claritin if symptoms recur, then restart.   Chronic cough. - She had been having chronic cough, this appeared to have improved with cessation of ACE inhibitor. - No further intervention needed in regards to the cough, would avoid ACE inhibitors in the future if possible.   Return in about 1 year (around 08/05/2020).   Date: 08/05/2019  MRN# [id number] Holly Green 08-23-1946    Holly Green is a 73 y.o. old female seen in consultation for chief complaint of:    Chief Complaint  Patient presents with  . Follow-up    pt reports of occ dry cough at times prod with clear mucus.     HPI:  Holly Green is a 73 y.o. female last seen with evidence of emphysema, and excess mucus production.  At last visit her symptoms had improved, and she was not requiring inhaler.  She was advised to follow-up in about a year. She feels that her breathing has been doing well. She has no new complaints. She has not had any chest infections that required courses of abx or prednisone.  She has occasional cough with clear sputum, it is mild and is not bothersome to her.  She denies  constant sinus drainage. She does not take any nasal spray.  She denies reflux. No pets.  She remains on loratidine, but not sure that it is doing anything for her.   The patient notes that her breathing is ok, she has mild dyspnea on moderate to severe exertion.  She can walk a [employer] without a problem, she can clean her house including laundry and vacuuming without being limited by dyspnea.  She is working, she works at [employer].  **Alpha 19/12/19>> MM **Chest x-ray 06/18/2018>> mild hyperinflation suggestive of emphysema.  Lungs are otherwise unremarkable.  Imaging personally reviewed. **CBC 12/15/2017>> absolute eosinophil count 100 **PFT 07/21/2018>> tracings personally reviewed.  FVC 76% predicted, FEV1 of 69% predicted, there is no significant improvement bronchodilator.  Ratio 72%.  Flow volume loop appears obstructed.  TLC is elevated at 153%, RV/TLC ratio is elevated.  DLCO is slightly reduced at 70%. - Overall this test shows significant air trapping which is suggestive of mild to moderate COPD.   Medication:    Current Outpatient Medications:  .  alendronate (FOSAMAX) 70 MG tablet, Take 1 tablet (70 mg total) by mouth every 7 (seven) days. Take with a full glass of water on an empty stomach., Disp: 4 tablet, Rfl: 5 .  bisoprolol-hydrochlorothiazide (ZIAC) 10-6.25 MG tablet, Take 1 tablet by mouth daily., Disp: 90 tablet, Rfl: 1 .  Calcium Carb-Cholecalciferol (CALCIUM-VITAMIN D) 600-400 MG-UNIT TABS, Take 1 tablet by mouth daily., Disp: , Rfl:  .  fluticasone (FLONASE) 50 MCG/ACT nasal spray, Place 2 sprays into both nostrils daily. (Patient not taking: Reported on 06/14/2019), Disp: 16 g, Rfl: 6 .  loratadine (CLARITIN) 10 MG tablet, Take 1 tablet (10 mg total) by mouth daily., Disp: 30 tablet, Rfl: 11 .  losartan (COZAAR) 25 MG tablet, Take 1 tablet by mouth once daily, Disp: 90 tablet, Rfl: 0 .  lovastatin (MEVACOR) 40 MG tablet, Take 1 tablet (40 mg total) by mouth at bedtime.,  Disp: 90 tablet, Rfl: 3 .  nitrofurantoin, macrocrystal-monohydrate, (MACROBID) 100 MG capsule, Take 1 capsule (100 mg total) by mouth 2 (two) times daily., Disp: 10 capsule, Rfl: 0 .  polyethylene glycol (MIRALAX / GLYCOLAX) packet, Take 17 g by mouth daily., Disp: , Rfl:    Allergies:  Aspirin and Lisinopril  Review of Systems:  Constitutional: Feels well. Cardiovascular: Denies chest pain, exertional chest pain.  Pulmonary: Denies hemoptysis, pleuritic chest pain.   The remainder of systems were reviewed and were found to be negative other than what is documented in the HPI.    Physical Examination:   VS: BP 132/80 (BP Location: Left Arm, Cuff Size: Normal)   Pulse 96   Temp 97.7 F (36.5 C) (Temporal)   Ht 5\' 6"  (1.676 m)   Wt 191 lb 9.6 oz (86.9 kg)   SpO2 96%   BMI 30.93 kg/m   General Appearance: No distress  Neuro:without focal findings, mental status, speech normal, alert and oriented HEENT: PERRLA, EOM intact Pulmonary: No wheezing, No rales  CardiovascularNormal S1,S2.  No m/r/g.  Abdomen: Benign, Soft, non-tender, No masses Renal:  No costovertebral tenderness  GU:  No performed at this time. Endoc: No evident thyromegaly, no signs of acromegaly or Cushing features Skin:   warm, no rashes, no ecchymosis  Extremities: normal, no cyanosis, clubbing.      LABORATORY PANEL:   CBC No results for input(s): WBC, HGB, HCT, PLT in the last 168 hours. ------------------------------------------------------------------------------------------------------------------  Chemistries  No results for input(s): NA, K, CL, CO2, GLUCOSE, BUN, CREATININE, CALCIUM, MG, AST, ALT, ALKPHOS, BILITOT in the last 168 hours.  Invalid input(s): GFRCGP ------------------------------------------------------------------------------------------------------------------  Cardiac Enzymes No results for input(s): TROPONINI in the last 168 hours.  ------------------------------------------------------------  RADIOLOGY:  No results found.     Thank  you for the consultation and for allowing [facility] to assist in the care of your patient. Our recommendations are noted above.  Please contact us if we can be of further service.   Marda Stalker, M.D., F.C.C.P.  Board Certified in Internal Medicine, Pulmonary Medicine, [place], and Sleep Medicine.  [facility] Office Number: [phone]   08/05/2019

## 2019-08-06 ENCOUNTER — Encounter: Payer: Self-pay | Admitting: Internal Medicine

## 2019-08-06 ENCOUNTER — Ambulatory Visit (INDEPENDENT_AMBULATORY_CARE_PROVIDER_SITE_OTHER): Payer: Medicare Other | Admitting: Internal Medicine

## 2019-08-06 ENCOUNTER — Other Ambulatory Visit: Payer: Self-pay

## 2019-08-06 VITALS — BP 132/80 | HR 96 | Temp 97.7°F | Ht 66.0 in | Wt 191.6 lb

## 2019-08-06 DIAGNOSIS — J449 Chronic obstructive pulmonary disease, unspecified: Secondary | ICD-10-CM

## 2019-08-06 NOTE — Patient Instructions (Addendum)
Continue to try to be active.  You can try to stop the loratidine, if you develop a runny nose or other symptoms then restart it.

## 2019-08-11 ENCOUNTER — Other Ambulatory Visit: Payer: Self-pay | Admitting: Family Medicine

## 2019-08-11 DIAGNOSIS — M810 Age-related osteoporosis without current pathological fracture: Secondary | ICD-10-CM

## 2019-09-02 ENCOUNTER — Other Ambulatory Visit: Payer: Self-pay | Admitting: Family Medicine

## 2019-10-15 ENCOUNTER — Encounter: Payer: Self-pay | Admitting: Family Medicine

## 2019-10-15 ENCOUNTER — Other Ambulatory Visit: Payer: Self-pay

## 2019-10-15 ENCOUNTER — Ambulatory Visit (INDEPENDENT_AMBULATORY_CARE_PROVIDER_SITE_OTHER): Payer: Medicare Other | Admitting: Family Medicine

## 2019-10-15 VITALS — BP 138/76 | HR 74 | Temp 97.6°F | Resp 18 | Ht 66.0 in | Wt 191.9 lb

## 2019-10-15 DIAGNOSIS — I700 Atherosclerosis of aorta: Secondary | ICD-10-CM

## 2019-10-15 DIAGNOSIS — I10 Essential (primary) hypertension: Secondary | ICD-10-CM | POA: Diagnosis not present

## 2019-10-15 DIAGNOSIS — M25552 Pain in left hip: Secondary | ICD-10-CM | POA: Insufficient documentation

## 2019-10-15 DIAGNOSIS — M810 Age-related osteoporosis without current pathological fracture: Secondary | ICD-10-CM

## 2019-10-15 DIAGNOSIS — M1120 Other chondrocalcinosis, unspecified site: Secondary | ICD-10-CM | POA: Diagnosis not present

## 2019-10-15 DIAGNOSIS — Z8719 Personal history of other diseases of the digestive system: Secondary | ICD-10-CM

## 2019-10-15 DIAGNOSIS — Z23 Encounter for immunization: Secondary | ICD-10-CM | POA: Diagnosis not present

## 2019-10-15 DIAGNOSIS — R7303 Prediabetes: Secondary | ICD-10-CM

## 2019-10-15 DIAGNOSIS — J439 Emphysema, unspecified: Secondary | ICD-10-CM

## 2019-10-15 DIAGNOSIS — M67441 Ganglion, right hand: Secondary | ICD-10-CM

## 2019-10-15 DIAGNOSIS — D508 Other iron deficiency anemias: Secondary | ICD-10-CM | POA: Diagnosis not present

## 2019-10-15 DIAGNOSIS — E669 Obesity, unspecified: Secondary | ICD-10-CM | POA: Diagnosis not present

## 2019-10-15 DIAGNOSIS — E782 Mixed hyperlipidemia: Secondary | ICD-10-CM | POA: Diagnosis not present

## 2019-10-15 DIAGNOSIS — E66811 Obesity, class 1: Secondary | ICD-10-CM

## 2019-10-15 DIAGNOSIS — J3089 Other allergic rhinitis: Secondary | ICD-10-CM

## 2019-10-15 DIAGNOSIS — M1711 Unilateral primary osteoarthritis, right knee: Secondary | ICD-10-CM | POA: Diagnosis not present

## 2019-10-15 HISTORY — DX: Pain in left hip: M25.552

## 2019-10-15 NOTE — Progress Notes (Signed)
Name: Holly Green   MRN: [id number]    DOB: 1946/04/30   Date:10/15/2019       Progress Note  Subjective  Chief Complaint  Chief Complaint  Patient presents with  . Hypertension    3 month recheck  . Hyperlipidemia  . Cyst    right hand    HPI  HTN:  BP at goal today.  Denies headaches, blurred vision, chest pain, shortness of breath, or BLE edema; no lightheadedness or dizziness.   She is taking Losartan 25mg , and Ziac 10-6.25mg .   She is doing well on her current dosing of her medication. Stable. BP Readings from Last 3 Encounters:  10/15/19 138/76  08/06/19 132/80  06/14/19 126/76   HLDAortic Atherosclerosis: On higher dose of lovastatin - on 40mg  now for about a month.  No chest pain, shortness of breath, or myalgias. She is cooking more with airfryer instead of deep frying, not eating out much right now.  Has aortic atherosclerosis - goal LDL <70.   OA: Seeing Dr. Meda Coffee for OA of right knee and chondrocalcinosis; pt also notes LEFT hip pain.  She is taking Tylenol arthritis PRN.  She notes the cold weather makes things worse.  Last visit to Dr. Meda Coffee was in July 2020 - had cortisone injection in the right knee which has helped that pain significantly.   Osteoporosis: Taking fossamax and calcium/vitamin D supplement.  Last DEXA was 08/20/2019  Obesity/Prediabetes: Works at [employer] - 10 hour days.  She stays very active at work.  Diet is not healthy - education provided on low fat/low carb diet.  Denies polyphagia, polydipsia, or polyuria.  Hx Anemia/GI bleed/GERD: Denies abdominal pain, difficulty swallowing, blood in stool/dark and tarry stool. Taking miralax and she is no longer straining. She was seeing Dr. Bonna Gains - last visit 1 year ago and told to follow up if needed. Colonoscopy due in 2024  COPD/AR: Taking claritin PRN; she states she has no issues breathing.  Seeing pulmonology annually for COPD check.   Patient Active Problem List   Diagnosis Date Noted   . Obesity (BMI 30.0-34.9) 08/10/2018  . Emphysema of lung ([place]) 08/10/2018  . Allergic rhinitis 08/10/2018  . COAD (chronic obstructive airways disease) ([place]) 08/03/2018  . Primary osteoarthritis of right knee 07/17/2018  . Chondrocalcinosis 07/17/2018  . 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 Anemia 12/17/2017  . Hematochezia   . Diverticulosis of large intestine without diverticulitis   . GI bleed 11/06/2017  . Left flank pain, chronic 03/07/2017  . Spondylolisthesis at L4-L5 level 03/07/2017  . Abdominal aortic atherosclerosis ([place]) 03/05/2017  . Simple cyst of kidney 03/05/2017  . Benign liver cyst 03/05/2017  . Medication monitoring encounter 11/22/2016  . GERD (gastroesophageal reflux disease) 08/07/2016  . Osteoporosis 05/07/2016  . Cardiac murmur 05/07/2016  . Prediabetes 10/15/2015  . Hyperlipidemia 10/15/2015  . Essential hypertension 10/15/2015  . Diverticulosis of colon, acquired 10/15/2015    Past Surgical History:  Procedure Laterality Date  . ABDOMINAL HYSTERECTOMY    . BACK SURGERY  2010  . BREAST CYST ASPIRATION Right 1990's  . COLONOSCOPY Left 11/09/2017   Procedure: COLONOSCOPY;  Surgeon: Virgel Manifold, MD;  [facility] END[facility]COPY;  Service: Endoscopy;  Laterality: Left;  . CYSTO WITH HYDRODISTENSION N/A 08/15/2017   Procedure: CYSTOSCOPY/HYDRODISTENSION;  Surgeon: Nickie Retort, MD;  Location: [facility] ORS;  Service: Urology;  Laterality: N/A;  . ESOPHAGOGASTRODUODENOSCOPY Left 11/09/2017   Procedure: ESOPHAGOGASTRODUODENOSCOPY (EGD);  Surgeon:  Virgel Manifold, MD;  Location: [facility] ENDOSCOPY;  Service: Endoscopy;  Laterality: Left;  . FOOT SURGERY Right     Family History  Problem Relation Age of Onset  . Hypertension Brother   . Heart disease Brother   . Heart failure Brother   . Hypertension Mother   . Stroke Mother   . Stroke Father   . Prostate cancer Father   . Aneurysm Sister   . Breast  cancer Sister 5  . Liver disease Maternal Aunt   . Heart failure Paternal Aunt   . Heart disease Paternal Uncle   . Heart attack Brother   . Heart disease Brother   . Breast cancer Maternal Aunt   . Heart disease Paternal Aunt   . Kidney cancer Neg Hx   . Bladder Cancer Neg Hx     Social History   Socioeconomic History  . Marital status: Single    Spouse name: Not on file  . Number of children: 1  . Years of education: Not on file  . Highest education level: High school graduate  Occupational History  . Occupation: retired  Scientific laboratory technician  . Financial resource strain: Not hard at all  . Food insecurity    Worry: Never true    Inability: Never true  . Transportation needs    Medical: No    Non-medical: No  Tobacco Use  . Smoking status: Former Research scientist (life sciences)  . Smokeless tobacco: Never Used  . Tobacco comment: teenage years about 1 year  Substance and Sexual Activity  . Alcohol use: No    Alcohol/week: 0.0 standard drinks  . Drug use: No  . Sexual activity: Never  Lifestyle  . Physical activity    Days per week: 0 days    Minutes per session: 0 min  . Stress: Not at all  Relationships  . Social connections    Talks on phone: More than three times a week    Gets together: Once a week    Attends religious service: More than 4 times per year    Active member of club or organization: Yes    Attends meetings of clubs or organizations: Never    Relationship status: Never married  . Intimate partner violence    Fear of current or ex partner: No    Emotionally abused: No    Physically abused: No    Forced sexual activity: No  Other Topics Concern  . Not on file  Social History Narrative  . Not on file     Current Outpatient Medications:  .  alendronate (FOSAMAX) 70 MG tablet, TAKE 1 TABLET BY MOUTH ONCE A WEEK (EVERY  7  DAYS).  TAKE  WITH  A  FULL  GLASS  OF  WATER  ON  EMPTY  STOMACH, Disp: 4 tablet, Rfl: 0 .  bisoprolol-hydrochlorothiazide (ZIAC) 10-6.25 MG tablet,  Take 1 tablet by mouth daily., Disp: 90 tablet, Rfl: 1 .  Calcium Carb-Cholecalciferol (CALCIUM-VITAMIN D) 600-400 MG-UNIT TABS, Take 1 tablet by mouth daily., Disp: , Rfl:  .  loratadine (CLARITIN) 10 MG tablet, Take 1 tablet (10 mg total) by mouth daily., Disp: 30 tablet, Rfl: 11 .  losartan (COZAAR) 25 MG tablet, Take 1 tablet by mouth once daily, Disp: 90 tablet, Rfl: 0 .  lovastatin (MEVACOR) 40 MG tablet, Take 1 tablet (40 mg total) by mouth at bedtime., Disp: 90 tablet, Rfl: 3 .  polyethylene glycol (MIRALAX / GLYCOLAX) packet, Take 17 g by mouth daily., Disp: ,  Rfl:  .  nitrofurantoin, macrocrystal-monohydrate, (MACROBID) 100 MG capsule, Take 1 capsule (100 mg total) by mouth 2 (two) times daily. (Patient not taking: Reported on 10/15/2019), Disp: 10 capsule, Rfl: 0  Allergies  Allergen Reactions  . Aspirin Other (See Comments)    Diverticulosis  . Lisinopril Cough    I personally reviewed active problem list, medication list, allergies, health maintenance, notes from last encounter, lab results with the patient/caregiver today.   ROS  Ten systems reviewed and is negative except as mentioned in HPI  Objective  Vitals:   10/15/19 1002  BP: 138/76  Pulse: 74  Resp: 18  Temp: 97.6 F (36.4 C)  TempSrc: Temporal  SpO2: 98%  Weight: 191 lb 14.4 oz (87 kg)  Height: 5\' 6"  (1.676 m)   Body mass index is 30.97 kg/m.  Physical Exam  Constitutional: Patient appears well-developed and well-nourished. No distress.  HENT: Head: Normocephalic and atraumatic. Eyes: Conjunctivae and EOM are normal. No scleral icterus.  Neck: Normal range of motion. Neck supple. No JVD present. No thyromegaly present.  Cardiovascular: Normal rate, regular rhythm and normal heart sounds.  No murmur heard. No BLE edema. Pulmonary/Chest: Effort normal and breath sounds normal. No respiratory distress. Musculoskeletal: Normal range of motion, no joint effusions. No gross deformities.  There is a  small ganglion cyst on the palmar aspect just below the PIP joint on the RIGHT middle finger - slightly tender but no erythema. Neurological: Pt is alert and oriented to person, place, and time. No cranial nerve deficit. Coordination, balance, strength, speech and gait are normal.  Skin: Skin is warm and dry. No rash noted. No erythema.  Psychiatric: Patient has a normal mood and affect. behavior is normal. Judgment and thought content normal.  No results found for this or any previous visit (from the past 72 hour(s)).  PHQ2/9: Depression screen [facility] 2/9 10/15/2019 06/14/2019 03/11/2019 02/08/2019 08/10/2018  Decreased Interest 0 0 0 0 0  Down, Depressed, Hopeless 0 0 0 0 0  PHQ - 2 Score 0 0 0 0 0  Altered sleeping 0 0 0 0 -  Tired, decreased energy 0 0 0 0 -  Change in appetite 0 0 0 0 -  Feeling bad or failure about yourself  0 0 0 0 -  Trouble concentrating 0 0 0 0 -  Moving slowly or fidgety/restless 0 0 0 0 -  Suicidal thoughts 0 0 0 0 -  PHQ-9 Score 0 0 0 0 -  Difficult doing work/chores Not difficult at all Not difficult at all Not difficult at all Not difficult at all -   PHQ-2/9 Result is negative.    Fall Risk: Fall Risk  10/15/2019 06/14/2019 03/11/2019 02/08/2019 08/10/2018  Falls in the past year? 0 0 0 0 No  Number falls in past yr: 0 0 0 0 -  Injury with Fall? 0 0 0 0 -  Follow up Falls evaluation completed - - - -   Assessment & Plan  1. Essential hypertension - At goal today; DASH diet - COMPLETE METABOLIC PANEL WITH GFR  2. Mixed hyperlipidemia - Labs due, continue statin - Lipid panel  3. Abdominal aortic atherosclerosis (HCC) - Continue statin therapy; goal LDL <70  4. Primary osteoarthritis of right knee 5. Chondrocalcinosis 6. Left hip pain - Seeing Dr. Meda Coffee  7. Age-related osteoporosis without current pathological fracture - Continue Vit D/Calcium and Fossamax - VITAMIN D 25 Hydroxy (Vit-D Deficiency, Fractures)  8. Prediabetes - Diet discussed -  COMPLETE METABOLIC PANEL WITH GFR - Hemoglobin A1c  9. Obesity (BMI 30.0-34.9) - Discussed importance of 150 minutes of physical activity weekly, eat two servings of fish weekly, eat one serving of tree nuts ( cashews, pistachios, pecans, almonds.Marland Kitchen) every other day, eat 6 servings of fruit/vegetables daily and drink plenty of water and avoid sweet beverages. - CBC with Differential/Platelet - COMPLETE METABOLIC PANEL WITH GFR - Lipid panel - Hemoglobin A1c  10. Other iron deficiency anemia - CBC with Differential/Platelet  11. History of GI bleed - CBC with Differential/Platelet  12. Pulmonary emphysema, unspecified emphysema type ([place]) - Stable without issue  13. Allergic rhinitis due to other allergic trigger, unspecified seasonality - Claritin and flonase.  14. Needs flu shot - Flu Vaccine QUAD High Dose(Fluad)  15. Ganglion cyst of joint of finger of right hand - She declines referral today to hand specialty, will try rest and ice first.

## 2019-10-16 LAB — LIPID PANEL
Chol/HDL Ratio: 2 ratio (ref 0.0–4.4)
Cholesterol, Total: 156 mg/dL (ref 100–199)
HDL: 79 mg/dL (ref >39)
LDL Chol Calc (NIH): 67 mg/dL (ref 0–99)
Triglycerides: 43 mg/dL (ref 0–149)
VLDL Cholesterol Cal: 10 mg/dL (ref 5–40)

## 2019-10-16 LAB — CBC WITH DIFFERENTIAL/PLATELET
Basophils Absolute: 0 10*3/uL (ref 0.0–0.2)
Basos: 1 %
EOS (ABSOLUTE): 0.1 10*3/uL (ref 0.0–0.4)
Eos: 3 %
Hematocrit: 37 % (ref 34.0–46.6)
Hemoglobin: 12.5 g/dL (ref 11.1–15.9)
Immature Grans (Abs): 0 10*3/uL (ref 0.0–0.1)
Immature Granulocytes: 0 %
Lymphocytes Absolute: 1.4 10*3/uL (ref 0.7–3.1)
Lymphs: 38 %
MCH: 29.7 pg (ref 26.6–33.0)
MCHC: 33.8 g/dL (ref 31.5–35.7)
MCV: 88 fL (ref 79–97)
Monocytes Absolute: 0.3 10*3/uL (ref 0.1–0.9)
Monocytes: 9 %
Neutrophils Absolute: 1.9 10*3/uL (ref 1.4–7.0)
Neutrophils: 49 %
Platelets: 216 10*3/uL (ref 150–450)
RBC: 4.21 x10E6/uL (ref 3.77–5.28)
RDW: 12.2 % (ref 11.7–15.4)
WBC: 3.8 10*3/uL (ref 3.4–10.8)

## 2019-10-16 LAB — VITAMIN D 25 HYDROXY (VIT D DEFICIENCY, FRACTURES): Vit D, 25-Hydroxy: 43.8 ng/mL (ref 30.0–100.0)

## 2019-10-16 LAB — HEMOGLOBIN A1C
Est. average glucose Bld gHb Est-mCnc: 120 mg/dL
Hgb A1c MFr Bld: 5.8 % — ABNORMAL HIGH (ref 4.8–5.6)

## 2019-10-16 LAB — COMPREHENSIVE METABOLIC PANEL
ALT: 14 IU/L (ref 0–32)
AST: 23 IU/L (ref 0–40)
Albumin/Globulin Ratio: 1.8 (ref 1.2–2.2)
Albumin: 4.3 g/dL (ref 3.7–4.7)
Alkaline Phosphatase: 97 IU/L (ref 39–117)
BUN/Creatinine Ratio: 24 (ref 12–28)
BUN: 15 mg/dL (ref 8–27)
Bilirubin Total: 1.4 mg/dL — ABNORMAL HIGH (ref 0.0–1.2)
CO2: 21 mmol/L (ref 20–29)
Calcium: 9.3 mg/dL (ref 8.7–10.3)
Chloride: 101 mmol/L (ref 96–106)
Creatinine, Ser: 0.62 mg/dL (ref 0.57–1.00)
GFR calc Af Amer: 104 mL/min/{1.73_m2} (ref >59)
GFR calc non Af Amer: 90 mL/min/{1.73_m2} (ref >59)
Globulin, Total: 2.4 g/dL (ref 1.5–4.5)
Glucose: 87 mg/dL (ref 65–99)
Potassium: 3.8 mmol/L (ref 3.5–5.2)
Sodium: 141 mmol/L (ref 134–144)
Total Protein: 6.7 g/dL (ref 6.0–8.5)

## 2019-10-26 ENCOUNTER — Other Ambulatory Visit: Payer: Self-pay | Admitting: Family Medicine

## 2019-10-26 ENCOUNTER — Other Ambulatory Visit: Payer: Self-pay

## 2019-10-26 DIAGNOSIS — I700 Atherosclerosis of aorta: Secondary | ICD-10-CM

## 2019-10-26 DIAGNOSIS — J3089 Other allergic rhinitis: Secondary | ICD-10-CM

## 2019-10-26 DIAGNOSIS — I10 Essential (primary) hypertension: Secondary | ICD-10-CM

## 2019-10-26 MED ORDER — LORATADINE 10 MG PO TABS: 10.0000 mg | ORAL_TABLET | Freq: Every day | ORAL | 11 refills | Status: AC

## 2019-10-26 MED ORDER — BISOPROLOL-HYDROCHLOROTHIAZIDE 10-6.25 MG PO TABS: 1.0000 | ORAL_TABLET | Freq: Every day | ORAL | 1 refills | Status: DC

## 2019-12-29 ENCOUNTER — Other Ambulatory Visit: Payer: Self-pay

## 2019-12-29 ENCOUNTER — Inpatient Hospital Stay
Admission: EM | Admit: 2019-12-29 | Discharge: 2019-12-31 | DRG: 379 | Disposition: A | Discharge status: Home / Self Care | Payer: Medicare Other | Attending: Internal Medicine | Admitting: Internal Medicine

## 2019-12-29 ENCOUNTER — Telehealth: Payer: Self-pay | Admitting: Gastroenterology

## 2019-12-29 DIAGNOSIS — E876 Hypokalemia: Secondary | ICD-10-CM | POA: Diagnosis present

## 2019-12-29 DIAGNOSIS — Z7983 Long term (current) use of bisphosphonates: Secondary | ICD-10-CM | POA: Diagnosis not present

## 2019-12-29 DIAGNOSIS — Z888 Allergy status to other drugs, medicaments and biological substances status: Secondary | ICD-10-CM

## 2019-12-29 DIAGNOSIS — J449 Chronic obstructive pulmonary disease, unspecified: Secondary | ICD-10-CM | POA: Diagnosis present

## 2019-12-29 DIAGNOSIS — Z20822 Contact with and (suspected) exposure to covid-19: Secondary | ICD-10-CM | POA: Diagnosis present

## 2019-12-29 DIAGNOSIS — Z8249 Family history of ischemic heart disease and other diseases of the circulatory system: Secondary | ICD-10-CM

## 2019-12-29 DIAGNOSIS — K5730 Diverticulosis of large intestine without perforation or abscess without bleeding: Secondary | ICD-10-CM

## 2019-12-29 DIAGNOSIS — Z8711 Personal history of peptic ulcer disease: Secondary | ICD-10-CM

## 2019-12-29 DIAGNOSIS — J302 Other seasonal allergic rhinitis: Secondary | ICD-10-CM | POA: Diagnosis present

## 2019-12-29 DIAGNOSIS — K5731 Diverticulosis of large intestine without perforation or abscess with bleeding: Secondary | ICD-10-CM | POA: Diagnosis not present

## 2019-12-29 DIAGNOSIS — D500 Iron deficiency anemia secondary to blood loss (chronic): Secondary | ICD-10-CM | POA: Diagnosis present

## 2019-12-29 DIAGNOSIS — K922 Gastrointestinal hemorrhage, unspecified: Secondary | ICD-10-CM | POA: Diagnosis present

## 2019-12-29 DIAGNOSIS — K219 Gastro-esophageal reflux disease without esophagitis: Secondary | ICD-10-CM | POA: Diagnosis present

## 2019-12-29 DIAGNOSIS — Z823 Family history of stroke: Secondary | ICD-10-CM | POA: Diagnosis not present

## 2019-12-29 DIAGNOSIS — I700 Atherosclerosis of aorta: Secondary | ICD-10-CM | POA: Diagnosis present

## 2019-12-29 DIAGNOSIS — Z886 Allergy status to analgesic agent status: Secondary | ICD-10-CM | POA: Diagnosis not present

## 2019-12-29 DIAGNOSIS — M810 Age-related osteoporosis without current pathological fracture: Secondary | ICD-10-CM | POA: Diagnosis present

## 2019-12-29 DIAGNOSIS — Z87891 Personal history of nicotine dependence: Secondary | ICD-10-CM

## 2019-12-29 DIAGNOSIS — Z90710 Acquired absence of both cervix and uterus: Secondary | ICD-10-CM

## 2019-12-29 DIAGNOSIS — Z79899 Other long term (current) drug therapy: Secondary | ICD-10-CM | POA: Diagnosis not present

## 2019-12-29 DIAGNOSIS — I10 Essential (primary) hypertension: Secondary | ICD-10-CM | POA: Diagnosis present

## 2019-12-29 DIAGNOSIS — E785 Hyperlipidemia, unspecified: Secondary | ICD-10-CM | POA: Diagnosis present

## 2019-12-29 HISTORY — DX: Gastrointestinal hemorrhage, unspecified: K92.2

## 2019-12-29 LAB — COMPREHENSIVE METABOLIC PANEL
ALT: 20 U/L (ref 0–44)
AST: 30 U/L (ref 15–41)
Albumin: 3.6 g/dL (ref 3.5–5.0)
Alkaline Phosphatase: 77 U/L (ref 38–126)
Anion gap: 14 (ref 5–15)
BUN: 21 mg/dL (ref 8–23)
CO2: 27 mmol/L (ref 22–32)
Calcium: 9.2 mg/dL (ref 8.9–10.3)
Chloride: 101 mmol/L (ref 98–111)
Creatinine, Ser: 0.68 mg/dL (ref 0.44–1.00)
GFR calc Af Amer: >60 mL/min (ref >60)
GFR calc non Af Amer: >60 mL/min (ref >60)
Glucose, Bld: 110 mg/dL — ABNORMAL HIGH (ref 70–99)
Potassium: 4.2 mmol/L (ref 3.5–5.1)
Sodium: 142 mmol/L (ref 135–145)
Total Bilirubin: 0.9 mg/dL (ref 0.3–1.2)
Total Protein: 6.9 g/dL (ref 6.5–8.1)

## 2019-12-29 LAB — CBC
HCT: 33.1 % — ABNORMAL LOW (ref 36.0–46.0)
Hemoglobin: 10.7 g/dL — ABNORMAL LOW (ref 12.0–15.0)
MCH: 29.2 pg (ref 26.0–34.0)
MCHC: 32.3 g/dL (ref 30.0–36.0)
MCV: 90.4 fL (ref 80.0–100.0)
Platelets: 176 10*3/uL (ref 150–400)
RBC: 3.66 MIL/uL — ABNORMAL LOW (ref 3.87–5.11)
RDW: 13.1 % (ref 11.5–15.5)
WBC: 7 10*3/uL (ref 4.0–10.5)
nRBC: 0 % (ref 0.0–0.2)

## 2019-12-29 LAB — HEMOGLOBIN AND HEMATOCRIT, BLOOD
HCT: 34.4 % — ABNORMAL LOW (ref 36.0–46.0)
HCT: 34.5 % — ABNORMAL LOW (ref 36.0–46.0)
Hemoglobin: 11.2 g/dL — ABNORMAL LOW (ref 12.0–15.0)
Hemoglobin: 11.3 g/dL — ABNORMAL LOW (ref 12.0–15.0)

## 2019-12-29 LAB — RESPIRATORY PANEL BY RT PCR (FLU A&B, COVID)
Influenza A by PCR: NEGATIVE
Influenza B by PCR: NEGATIVE
SARS Coronavirus 2 by RT PCR: NEGATIVE

## 2019-12-29 MED ORDER — PRAVASTATIN SODIUM 20 MG PO TABS
40.0000 mg | ORAL_TABLET | Freq: Every day | ORAL | Status: DC
  Administered 2019-12-29 – 2019-12-30 (×2): 40 mg via ORAL
  Filled 2019-12-29 (×2): qty 2

## 2019-12-29 MED ORDER — BISOPROLOL-HYDROCHLOROTHIAZIDE 10-6.25 MG PO TABS
1.0000 | ORAL_TABLET | Freq: Every day | ORAL | Status: DC
  Administered 2019-12-30 – 2019-12-31 (×2): 1 via ORAL
  Filled 2019-12-29 (×2): qty 1

## 2019-12-29 MED ORDER — LORATADINE 10 MG PO TABS
10.0000 mg | ORAL_TABLET | Freq: Every day | ORAL | Status: DC
  Administered 2019-12-30 – 2019-12-31 (×2): 10 mg via ORAL
  Filled 2019-12-29 (×2): qty 1

## 2019-12-29 MED ORDER — SODIUM CHLORIDE 0.9 % IV SOLN: INTRAVENOUS | Status: AC

## 2019-12-29 MED ORDER — ACETAMINOPHEN 325 MG PO TABS
650.0000 mg | ORAL_TABLET | Freq: Four times a day (QID) | ORAL | Status: DC | PRN
  Administered 2019-12-29: 650 mg via ORAL
  Filled 2019-12-29: qty 2

## 2019-12-29 NOTE — Consult Note (Signed)
GI Inpatient Consult Note  Reason for Consult: Rectal bleeding   Attending Requesting Consult: Dr. Marjean Donna, MD  History of Present Illness: Holly Green is a 74 y.o. female seen for evaluation of rectal bleeding at the request of Dr. Marjean Donna. Pt has a PMH of HTN, HLD, Hx of diverticulitis, Hx of diverticular bleeding s/p positive bleeding scan and embolization 2015 and 2009, osteoporosis presented to the ED this morning for rectal bleeding. She reports she has been having some bilateral lower abd cramping over the past week which has been growing in intensity. She reports around midnight last night, she had an episode of rectal bleeding which was large volume bright red blood with dark clots in the toilet bowl.  She reports she had another episode around 2 AM.  She reports both of these episodes were associated with lower abdominal cramping, which was relieved after passing the blood.  She reports she had one episode of bleeding this morning, which was smaller in volume.  She called into [facility] office this morning with complaints where she was advised to present immediately to the ED for concerns of acute lower GI bleeding.  She has not had any bleeding since she presented to the ED.  Upon presentation to the ED, patient was found to have a 2-gram drop in hemoglobin over the past 2 months.  She reports a history of lower GI bleeding secondary to diverticulosis. Last hospitalization for bleeding in 10/2017 where she underwent normal EGD and colonoscopy by Dr. Enriqueta Shutter showing pandiverticulosis with no active bleeding seen. She had diverticular bleeding in 2015 which required RBC scan which showed hemorrhage in right colon and she underwent embolization. She also had positive RBC scan in 2009 showing active bleed in mid transverse and descending colon.  She denies any fever, chills, or night sweats.  She denies any frequent NSAID use.  She is not currently on any anticoagulation or antiplatelet  therapy.  She reports her symptoms feel similar to previous episodes of bleeding attributed to diverticulosis.  Patient denies any nausea, vomiting, indigestion, chest pain, shortness of breath.   Last Colonoscopy: 10/2017 - Diverticulosis in the sigmoid colon, in the descending colon, in the ascending colon and in the cecum. These were not bleeding. These were the likely source of her hematochezia - The entire examined colon is normal otherwise. - No specimens collected.  Last Endoscopy: 10/2017 No old or new blood was present in the Esophagus or stomach. A careful thorough exam for underlying lesions could not be done of the esophagus and stomach as the procedure had to be aborted due to declining O2 levels.    Past Medical History:  Past Medical History:  Diagnosis Date  . Abdominal aortic atherosclerosis ([place]) 03/05/2017   CT scan April 2018  . Arthritis   . Bleeding disorder ([place])   . Diverticulitis 2015  . GERD (gastroesophageal reflux disease)   . History of kidney stones   . History of stomach ulcers   . Hyperlipidemia   . Hypertension   . Osteoporosis   . Pre-diabetes   . Prediabetes 10/15/2015  . Spondylolisthesis at L4-L5 level 03/07/2017    Problem List: Patient Active Problem List   Diagnosis Date Noted  . Left hip pain 10/15/2019  . Obesity (BMI 30.0-34.9) 08/10/2018  . Emphysema of lung ([place]) 08/10/2018  . Allergic rhinitis 08/10/2018  . COAD (chronic obstructive airways disease) ([place]) 08/03/2018  . Primary osteoarthritis of right knee 07/17/2018  . Chondrocalcinosis 07/17/2018  .  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 Anemia 12/17/2017  . Diverticulosis of large intestine without diverticulitis   . History of GI bleed 11/06/2017  . Left flank pain, chronic 03/07/2017  . Spondylolisthesis at L4-L5 level 03/07/2017  . Abdominal aortic atherosclerosis ([place]) 03/05/2017  . Simple cyst of kidney 03/05/2017  . Benign liver  cyst 03/05/2017  . Medication monitoring encounter 11/22/2016  . GERD (gastroesophageal reflux disease) 08/07/2016  . Osteoporosis 05/07/2016  . Cardiac murmur 05/07/2016  . Prediabetes 10/15/2015  . Hyperlipidemia 10/15/2015  . Essential hypertension 10/15/2015  . Diverticulosis of colon, acquired 10/15/2015    Past Surgical History: Past Surgical History:  Procedure Laterality Date  . ABDOMINAL HYSTERECTOMY    . BACK SURGERY  2010  . BREAST CYST ASPIRATION Right 1990's  . COLONOSCOPY Left 11/09/2017   Procedure: COLONOSCOPY;  Surgeon: Virgel Manifold, MD;  Location[facility] ENDOSCOPY;  Service: Endoscopy;  Laterality: Left;  . CYSTO WITH HYDRODISTENSION N/A 08/15/2017   Procedure: CYSTOSCOPY/HYDRODISTENSION;  Surgeon: Nickie Retort, MD;  Location: [facility] ORS;  Service: Urology;  Laterality: N/A;  . ESOPHAGOGASTRODUODENOSCOPY Left 11/09/2017   Procedure: ESOPHAGOGASTRODUODENOSCOPY (EGD);  Surgeon: Virgel Manifold, MD;  Location: [facility] ENDOSCOPY;  Service: Endoscopy;  Laterality: Left;  . FOOT SURGERY Right     Allergies: Allergies  Allergen Reactions  . Aspirin Other (See Comments)    Diverticulosis  . Lisinopril Cough    Home Medications: (Not in a hospital admission)  Home medication reconciliation was completed with the patient.   Scheduled Inpatient Medications:    Continuous Inpatient Infusions:    PRN Inpatient Medications:    Family History: family history includes Aneurysm in her sister; Breast cancer in her maternal aunt; Breast cancer (age of onset: 26) in her sister; Heart attack in her brother; Heart disease in her brother, brother, paternal aunt, and paternal uncle; Heart failure in her brother and paternal aunt; Hypertension in her brother and mother; Liver disease in her maternal aunt; Prostate cancer in her father; Stroke in her father and mother.  The patient's family history is negative for inflammatory bowel disorders, GI malignancy, or solid  organ transplantation.  Social History:   reports that she has quit smoking. She has never used smokeless tobacco. She reports that she does not drink alcohol or use drugs. The patient denies ETOH, tobacco, or drug use.   Review of Systems: Constitutional: Weight is stable.  Eyes: No changes in vision. ENT: No oral lesions, sore throat.  GI: see HPI.  Heme/Lymph: No easy bruising.  CV: No chest pain.  GU: No hematuria.  Integumentary: No rashes.  Neuro: No headaches.  Psych: No depression/anxiety.  Endocrine: No heat/cold intolerance.  Allergic/Immunologic: No urticaria.  Resp: No cough, SOB.  Musculoskeletal: No joint swelling.    Physical Examination: BP (!) 156/91   Pulse 63   Temp 98.5 F (36.9 C) (Oral)   Resp 16   Ht 5\' 6"  (1.676 m)   Wt 86.6 kg   SpO2 98%   BMI 30.83 kg/m  Gen: NAD, alert and oriented x 4 HEENT: PEERLA, EOMI, Neck: supple, no JVD or thyromegaly Chest: CTA bilaterally, no wheezes, crackles, or other adventitious sounds CV: RRR, no m/g/c/r Abd: soft, nondistended, +BS in all four quadrants; mildly tender to deep palpation in LLQ and suprapubic regions, no HSM, guarding, ridigity, or rebound tenderness Ext: no edema, well perfused with 2+ pulses, Skin: no rash or lesions noted Lymph: no LAD  Data: Lab Results  Component Value Date   WBC 7.0 12/29/2019   HGB 10.7 (L) 12/29/2019   HCT 33.1 (L) 12/29/2019   MCV 90.4 12/29/2019   PLT 176 12/29/2019   Recent Labs  Lab 12/29/19 1018  HGB 10.7*   Lab Results  Component Value Date   NA 142 12/29/2019   K 4.2 12/29/2019   CL 101 12/29/2019   CO2 27 12/29/2019   BUN 21 12/29/2019   CREATININE 0.68 12/29/2019   Lab Results  Component Value Date   ALT 20 12/29/2019   AST 30 12/29/2019   ALKPHOS 77 12/29/2019   BILITOT 0.9 12/29/2019   No results for input(s): APTT, INR, PTT in the last 168 hours. Assessment/Plan:  74 y/o AA female with a PMH of history of diverticular bleeding s/p  positive bleeding scan and embolization in 2015 and 2009, abdominal aortic atherosclerosis, HTN, and HLD admitted for hematochezia  1.  Rectal bleeding - differential includes diverticular bleeding, ischemic colitis, IBD, AVMs, polyp, malignancy 2.  History of diverticular bleeding s/p embolization  3.  Lower abdominal cramping  -Patient's clinical presentation and symptoms are consistent with diverticular bleeding.  She has had a 2 g drop in her hemoglobin over the past 2 months.  -She has had no recurrent bleeding since being in the ER -Her diverticular bleeding is likely self-limiting in nature. -Tagged RBC scan is likely low yield at this point in time since there is no evidence of active or recent bleeding -Since patient just had adequate visualization of colon with colonoscopy 2018, no indication for urgent evaluation -Clear liquid diet today continue close observation with serial physical examinations and close monitoring of hemoglobin -If patient has recurrent episodes of hematochezia, I would recommend a tagged RBC scan.  If positive, consult vascular surgery. -Following along with you    Thank you for the consult. Please call with questions or concerns.  Reeves Forth [facility] [phone] (Cell)

## 2019-12-29 NOTE — Telephone Encounter (Signed)
Patient has been advised to go to ER immediately.  She states that she has been passing blood in her stool.  Last night she went to the bathroom and past clots of blood, and she is experiencing abdominal pain.  Notified ER that patient will be coming from home in her personal vehicle to be evaluated.  Thanks,  [place], [place]

## 2019-12-29 NOTE — H&P (Signed)
History and Physical:    Holly Green   [id number] DOB: Sep 13, 1946 DOA: 12/29/2019  Referring MD/provider: Dr. Jari Pigg PCP: [facility]   Patient coming from: Home  Chief Complaint: BRBPR  History of Present Illness:   MIXTLI Holly Green is an 74 y.o. female with past medical history significant for diverticular bleeds in the past requiring embolization and blood transfusion most recently 2015 was in her usual state of health until 1 week ago when she she thought that she may have had some blood-tinged stool.  She thought that her stool was somewhat pink but was not really sure she did notice that there was some blood streaking on the toilet paper which she thought was unusual.  She had no chest pain or dizziness during that time.  Last night at 12 AM patient had an episode of severe abdominal cramping followed by motion of bright red blood per rectum which she states was a considerable amount.  She still had no dizziness.  Approximately 2 hours later patient had recurrent abdominal cramping and then passed several full clots of blood next with blood in stool.This morning at 9 AM patient passed clots again but this time no liquid blood and no stool.  She now presents for evaluation.  Other than her abdominal pain she has no other complaints.  She denies dizziness, syncope or presyncope.  No chest pain or shortness of breath.  No fevers or chills.  No nausea or vomiting.   ED Course:  The patient was noted to be hemodynamically stable.  She had bright red blood per rectum on examination.  GI was consulted who recommended conservative management, following serial H&H.  They recommended bleeding scan if patient started to bleed again.  ROS:   ROS   Review of Systems: General: Denies fever, chills, malaise,  Endocrine: Denies heat/cold intolerance, polyuria or weight loss. Respiratory: Denies cough, SOB at rest or hemoptysis Cardiovascular: Denies chest pain or  palpitations GU: Denies dysuria, frequency or hematuria Musculoskeletal: Denies new back or joint pain Mood/affect: Denies anxiety/depression    Past Medical History:   Past Medical History:  Diagnosis Date  . Abdominal aortic atherosclerosis ([place]) 03/05/2017   CT scan April 2018  . Arthritis   . Bleeding disorder ([place])   . Diverticulitis 2015  . GERD (gastroesophageal reflux disease)   . History of kidney stones   . History of stomach ulcers   . Hyperlipidemia   . Hypertension   . Osteoporosis   . Pre-diabetes   . Prediabetes 10/15/2015  . Spondylolisthesis at L4-L5 level 03/07/2017    Past Surgical History:   Past Surgical History:  Procedure Laterality Date  . ABDOMINAL HYSTERECTOMY    . BACK SURGERY  2010  . BREAST CYST ASPIRATION Right 1990's  . COLONOSCOPY Left 11/09/2017   Procedure: COLONOSCOPY;  Surgeon: Virgel Manifold, MD;  Location: [facility] ENDOSCOPY;  Service: Endoscopy;  Laterality: Left;  . CYSTO WITH HYDRODISTENSION N/A 08/15/2017   Procedure: CYSTOSCOPY/HYDRODISTENSION;  Surgeon: Nickie Retort, MD;  Location: [facility] ORS;  Service: Urology;  Laterality: N/A;  . ESOPHAGOGASTRODUODENOSCOPY Left 11/09/2017   Procedure: ESOPHAGOGASTRODUODENOSCOPY (EGD);  Surgeon: Virgel Manifold, MD;  Location: [facility] ENDOSCOPY;  Service: Endoscopy;  Laterality: Left;  . FOOT SURGERY Right     Social History:   Social History   Socioeconomic History  . Marital status: Single    Spouse name: Not on file  . Number of children: 1  . Years of education:  Not on file  . Highest education level: High school graduate  Occupational History  . Occupation: retired  Tobacco Use  . Smoking status: Former Research scientist (life sciences)  . Smokeless tobacco: Never Used  . Tobacco comment: teenage years about 1 year  Substance and Sexual Activity  . Alcohol use: No    Alcohol/week: 0.0 standard drinks  . Drug use: No  . Sexual activity: Never  Other Topics Concern  . Not on file  Social  History Narrative  . Not on file   Social Determinants of Health   Financial Resource Strain: Low Risk   . Difficulty of Paying Living Expenses: Not hard at all  Food Insecurity: No Food Insecurity  . Worried About Charity fundraiser in the Last Year: Never true  . Ran Out of Food in the Last Year: Never true  Transportation Needs: No Transportation Needs  . Lack of Transportation (Medical): No  . Lack of Transportation (Non-Medical): No  Physical Activity: Inactive  . Days of Exercise per Week: 0 days  . Minutes of Exercise per Session: 0 min  Stress: No Stress Concern Present  . Feeling of Stress : Not at all  Social Connections: Slightly Isolated  . Frequency of Communication with Friends and Family: More than three times a week  . Frequency of Social Gatherings with Friends and Family: Once a week  . Attends Religious Services: More than 4 times per year  . Active Member of Clubs or Organizations: Yes  . Attends Archivist Meetings: Never  . Marital Status: Never married  Intimate Partner Violence: Not At Risk  . Fear of Current or Ex-Partner: No  . Emotionally Abused: No  . Physically Abused: No  . Sexually Abused: No    Allergies   Aspirin and Lisinopril  Family history:   Family History  Problem Relation Age of Onset  . Hypertension Brother   . Heart disease Brother   . Heart failure Brother   . Hypertension Mother   . Stroke Mother   . Stroke Father   . Prostate cancer Father   . Aneurysm Sister   . Breast cancer Sister 53  . Liver disease Maternal Aunt   . Heart failure Paternal Aunt   . Heart disease Paternal Uncle   . Heart attack Brother   . Heart disease Brother   . Breast cancer Maternal Aunt   . Heart disease Paternal Aunt   . Kidney cancer Neg Hx   . Bladder Cancer Neg Hx     Current Medications:   Prior to Admission medications   Medication Sig Start Date End Date Taking? Authorizing Provider  acetaminophen (TYLENOL) 650 MG  CR tablet Take 650-1,300 mg by mouth every 8 (eight) hours as needed for pain.   Yes [provider]  alendronate (FOSAMAX) 70 MG tablet TAKE 1 TABLET BY MOUTH ONCE A WEEK (EVERY  7  DAYS).  TAKE  WITH  A  FULL  GLASS  OF  WATER  ON  EMPTY  STOMACH Patient taking differently: Take 70 mg by mouth every Sunday.  08/13/19  Yes Delsa Grana, PA-C  bisoprolol-hydrochlorothiazide (ZIAC) 10-6.25 MG tablet Take 1 tablet by mouth daily. 10/26/19  Yes Hubbard Hartshorn, FNP  Calcium Carb-Cholecalciferol (CALCIUM-VITAMIN D) 600-400 MG-UNIT TABS Take 1 tablet by mouth daily.   Yes [provider]  loratadine (CLARITIN) 10 MG tablet Take 1 tablet (10 mg total) by mouth daily. 10/26/19  Yes Hubbard Hartshorn, FNP  losartan (COZAAR)  25 MG tablet Take 1 tablet by mouth once daily Patient taking differently: Take 25 mg by mouth daily.  10/26/19  Yes Hubbard Hartshorn, FNP  lovastatin (MEVACOR) 40 MG tablet Take 1 tablet (40 mg total) by mouth at bedtime. 03/11/19  Yes Hubbard Hartshorn, FNP  polyethylene glycol (MIRALAX / GLYCOLAX) packet Take 17 g by mouth daily.   Yes [provider]    Physical Exam:   Vitals:   12/29/19 0958 12/29/19 1000 12/29/19 1325  BP: (!) 165/84  (!) 156/91  Pulse: 69  63  Resp: 17  16  Temp: 98.5 F (36.9 C)    TempSrc: Oral    SpO2: 99%  98%  Weight:  86.6 kg   Height:  5\' 6"  (1.676 m)      Physical Exam: Blood pressure (!) 156/91, pulse 63, temperature 98.5 F (36.9 C), temperature source Oral, resp. rate 16, height 5\' 6"  (1.676 m), weight 86.6 kg, SpO2 98 %. Gen: Somewhat tired appearing female lying flat in bed in no acute distress Constitutional: Alert and awake, oriented x3, not in any acute distress. Eyes: sclera anicteric, conjuctiva clear, no lid lag, no exophthalmos, EOMI CVS: S1-S2 clear, no murmur rubs or gallops, no LE edema, normal pedal pulses  Respiratory:  normal effort, symmetrical excursion, CTA without adventitious sounds.  GI: NABS, soft,  NT, no tenderness to light or deep palpation.  No rebound tenderness  LE: No edema. No cyanosis Neuro: A/O x 3, Moving all extremities equally with normal strength, CN 3-12 intact, grossly nonfocal.  Psych: patient is logical and coherent, judgement and insight appear normal, mood and affect appropriate to situation. Skin: no rashes or lesions or ulcers,    Data Review:    Labs: Basic Metabolic Panel: Recent Labs  Lab 12/29/19 1018  NA 142  K 4.2  CL 101  CO2 27  GLUCOSE 110*  BUN 21  CREATININE 0.68  CALCIUM 9.2   Liver Function Tests: Recent Labs  Lab 12/29/19 1018  AST 30  ALT 20  ALKPHOS 77  BILITOT 0.9  PROT 6.9  ALBUMIN 3.6   No results for input(s): LIPASE, AMYLASE in the last 168 hours. No results for input(s): AMMONIA in the last 168 hours. CBC: Recent Labs  Lab 12/29/19 1018 12/29/19 1450  WBC 7.0  --   HGB 10.7* 11.2*  HCT 33.1* 34.4*  MCV 90.4  --   PLT 176  --    Cardiac Enzymes: No results for input(s): CKTOTAL, CKMB, CKMBINDEX, TROPONINI in the last 168 hours.  BNP (last 3 results) No results for input(s): PROBNP in the last 8760 hours. CBG: No results for input(s): GLUCAP in the last 168 hours.  Urinalysis    Component Value Date/Time   COLORURINE STRAW (A) 08/08/2015 1213   APPEARANCEUR Clear 04/29/2018 1249   LABSPEC 1.005 08/08/2015 1213   LABSPEC 1.020 01/11/2014 1139   PHURINE 8.0 08/08/2015 1213   GLUCOSEU Negative 04/29/2018 1249   GLUCOSEU 150 mg/dL 01/11/2014 1139   HGBUR NEGATIVE 08/08/2015 1213   BILIRUBINUR Negative 06/15/2019 0952   BILIRUBINUR Negative 04/29/2018 1249   BILIRUBINUR Negative 01/11/2014 1139   KETONESUR NEGATIVE 08/08/2015 1213   PROTEINUR Negative 06/15/2019 0952   PROTEINUR Negative 04/29/2018 1249   PROTEINUR NEGATIVE 08/08/2015 1213   UROBILINOGEN negative (A) 06/15/2019 0952   UROBILINOGEN 0.2 09/27/2009 0843   NITRITE Negative 06/15/2019 0952   NITRITE Negative 04/29/2018 1249   NITRITE  NEGATIVE 08/08/2015 1213   LEUKOCYTESUR Large (  3+) (A) 06/15/2019 0952   LEUKOCYTESUR Negative 04/29/2018 1249   LEUKOCYTESUR Negative 01/11/2014 1139      Radiographic Studies: No results found.  EKG: Ordered and pending   Assessment/Plan:   Principal Problem:   Acute lower GI bleeding Active Problems:   Essential hypertension   GERD (gastroesophageal reflux disease)   Diverticulosis of large intestine without diverticulitis   COAD (chronic obstructive airways disease) ([place])  74 year old female with known diverticular bleed in past presents with hematochezia.  She is hemodynamically stable.  ACUTE LOWER GI BLEED Most likely acute diverticular bleed, however it seems that patient has stopped bleeding as she has not had any further BRBPR since 9 AM. Patient is hemodynamically stable We will follow H&H every 6 hours As noted above, patient seen by GI who recommend bleeding scan if she starts to bleed again.  They will continue to follow.  No acute need for colonoscopy at present.  Clear liquid diet.  HTN Patient is hemodynamically stable indeed is slightly hypertensive, will continue her bisoprolol/HCTZ However will hold losartan until we know that she is not bleeding any further    Other information:   DVT prophylaxis: SCD ordered. Code Status: Full Family Communication: Patient states her family knows she is in house Disposition Plan: Home Consults called: GI Admission status: Inpatient  Aura Bibby Tublu Zamariah Seaborn [facility]  If 7PM-7AM, please contact night-coverage [email or web address] Password [email or web address] 12/29/2019, 3:52 PM

## 2019-12-29 NOTE — ED Triage Notes (Signed)
Pt c/o lower abd pain with 3 episodes of bloody stools, passing clots since 12am, states she has a hx of diverticulosis with GI bleeding and blood transfusions. Pt is in NAD, VSS>

## 2019-12-29 NOTE — Telephone Encounter (Signed)
Patient called & states she has had 2 bowel movements & it had blood with cloths in the bowl. Patient states she is having stomach pain. (pt of DR Tahiliani's)

## 2019-12-29 NOTE — ED Provider Notes (Signed)
[facility] Emergency Department Provider Note  ____________________________________________   First MD Initiated Contact with Patient 12/29/19 1108     (approximate)  I have reviewed the triage vital signs and the nursing notes.   HISTORY  Chief Complaint GI Bleeding    HPI Holly Green is a 74 y.o. female with hypertension, hyperlipidemia, diverticulitis and on anticoagulation comes in with rectal bleeding.  Patient has required blood transfusions and embolization before.  Patient states that the bleeding started last night, severe, constant, nothing makes it better, nothing makes it worse.  Does have some associated lower abdominal pain before having a bowel movement but none at rest.  Does not feel fatigued or lightheaded at this time.          Past Medical History:  Diagnosis Date  . Abdominal aortic atherosclerosis ([place]) 03/05/2017   CT scan April 2018  . Arthritis   . Bleeding disorder ([place])   . Diverticulitis 2015  . GERD (gastroesophageal reflux disease)   . History of kidney stones   . History of stomach ulcers   . Hyperlipidemia   . Hypertension   . Osteoporosis   . Pre-diabetes   . Prediabetes 10/15/2015  . Spondylolisthesis at L4-L5 level 03/07/2017    Patient Active Problem List   Diagnosis Date Noted  . Left hip pain 10/15/2019  . Obesity (BMI 30.0-34.9) 08/10/2018  . Emphysema of lung ([place]) 08/10/2018  . Allergic rhinitis 08/10/2018  . COAD (chronic obstructive airways disease) ([place]) 08/03/2018  . Primary osteoarthritis of right knee 07/17/2018  . Chondrocalcinosis 07/17/2018  . 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 Anemia 12/17/2017  . Diverticulosis of large intestine without diverticulitis   . History of GI bleed 11/06/2017  . Left flank pain, chronic 03/07/2017  . Spondylolisthesis at L4-L5 level 03/07/2017  . Abdominal aortic atherosclerosis ([place]) 03/05/2017  . Simple cyst  of kidney 03/05/2017  . Benign liver cyst 03/05/2017  . Medication monitoring encounter 11/22/2016  . GERD (gastroesophageal reflux disease) 08/07/2016  . Osteoporosis 05/07/2016  . Cardiac murmur 05/07/2016  . Prediabetes 10/15/2015  . Hyperlipidemia 10/15/2015  . Essential hypertension 10/15/2015  . Diverticulosis of colon, acquired 10/15/2015    Past Surgical History:  Procedure Laterality Date  . ABDOMINAL HYSTERECTOMY    . BACK SURGERY  2010  . BREAST CYST ASPIRATION Right 1990's  . COLONOSCOPY Left 11/09/2017   Procedure: COLONOSCOPY;  Surgeon: Virgel Manifold, MD;  [facility] END[facility]COPY;  Service: Endoscopy;  Laterality: Left;  . CYSTO WITH HYDRODISTENSION N/A 08/15/2017   Procedure: CYSTOSCOPY/HYDRODISTENSION;  Surgeon: Nickie Retort, MD;  Location: [facility] ORS;  Service: Urology;  Laterality: N/A;  . ESOPHAGOGASTRODUODENOSCOPY Left 11/09/2017   Procedure: ESOPHAGOGASTRODUODENOSCOPY (EGD);  Surgeon: Virgel Manifold, MD;  Location: [facility] ENDOSCOPY;  Service: Endoscopy;  Laterality: Left;  . FOOT SURGERY Right     Prior to Admission medications   Medication Sig Start Date End Date Taking? Authorizing Provider  alendronate (FOSAMAX) 70 MG tablet TAKE 1 TABLET BY MOUTH ONCE A WEEK (EVERY  7  DAYS).  TAKE  WITH  A  FULL  GLASS  OF  WATER  ON  EMPTY  STOMACH 08/13/19   Delsa Grana, PA-C  bisoprolol-hydrochlorothiazide [facility]) 10-6.25 MG tablet Take 1 tablet by mouth daily. 10/26/19   Hubbard Hartshorn, FNP  Calcium Carb-Cholecalciferol (CALCIUM-VITAMIN D) 600-400 MG-UNIT TABS Take 1 tablet by mouth daily.    [provider]  loratadine (CLARITIN) 10 MG  tablet Take 1 tablet (10 mg total) by mouth daily. 10/26/19   Hubbard Hartshorn, FNP  losartan (COZAAR) 25 MG tablet Take 1 tablet by mouth once daily 10/26/19   Hubbard Hartshorn, FNP  lovastatin (MEVACOR) 40 MG tablet Take 1 tablet (40 mg total) by mouth at bedtime. 03/11/19   Hubbard Hartshorn, FNP  polyethylene glycol  (MIRALAX / GLYCOLAX) packet Take 17 g by mouth daily.    [provider]    Allergies Aspirin and Lisinopril  Family History  Problem Relation Age of Onset  . Hypertension Brother   . Heart disease Brother   . Heart failure Brother   . Hypertension Mother   . Stroke Mother   . Stroke Father   . Prostate cancer Father   . Aneurysm Sister   . Breast cancer Sister 41  . Liver disease Maternal Aunt   . Heart failure Paternal Aunt   . Heart disease Paternal Uncle   . Heart attack Brother   . Heart disease Brother   . Breast cancer Maternal Aunt   . Heart disease Paternal Aunt   . Kidney cancer Neg Hx   . Bladder Cancer Neg Hx     Social History Social History   Tobacco Use  . Smoking status: Former Research scientist (life sciences)  . Smokeless tobacco: Never Used  . Tobacco comment: teenage years about 1 year  Substance Use Topics  . Alcohol use: No    Alcohol/week: 0.0 standard drinks  . Drug use: No      Review of Systems Constitutional: No fever/chills Eyes: No visual changes. ENT: No sore throat. Cardiovascular: Denies chest pain. Respiratory: Denies shortness of breath. Gastrointestinal: No abdominal pain.  No nausea, no vomiting.  No diarrhea.  No constipation.  Bright red blood per rectum Genitourinary: Negative for dysuria. Musculoskeletal: Negative for back pain. Skin: Negative for rash. Neurological: Negative for headaches, focal weakness or numbness. All other ROS negative ____________________________________________   PHYSICAL EXAM:  VITAL SIGNS: ED Triage Vitals  Enc Vitals Group     BP 12/29/19 0958 (!) 165/84     Pulse Rate 12/29/19 0958 69     Resp 12/29/19 0958 17     Temp 12/29/19 0958 98.5 F (36.9 C)     Temp Source 12/29/19 0958 Oral     SpO2 12/29/19 0958 99 %     Weight 12/29/19 1000 191 lb (86.6 kg)     Height 12/29/19 1000 5\' 6"  (1.676 m)     Head Circumference --      Peak Flow --      Pain Score 12/29/19 0959 6     Pain Loc --      Pain  Edu? --      Excl. in [place]? --     Constitutional: Alert and oriented. Well appearing and in no acute distress. Eyes: Conjunctivae are normal. EOMI. Head: Atraumatic. Nose: No congestion/rhinnorhea. Mouth/Throat: Mucous membranes are moist.   Neck: No stridor. Trachea Midline. FROM Cardiovascular: Normal rate, regular rhythm. Grossly normal heart sounds.  Good peripheral circulation. Respiratory: Normal respiratory effort.  No retractions. Lungs CTAB. Gastrointestinal: Soft and nontender. No distention. No abdominal bruits.  Musculoskeletal: No lower extremity tenderness nor edema.  No joint effusions. Neurologic:  Normal speech and language. No gross focal neurologic deficits are appreciated.  Skin:  Skin is warm, dry and intact. No rash noted. Psychiatric: Mood and affect are normal. Speech and behavior are normal. GU: Good amount of bright red blood per  rectum  ____________________________________________   LABS (all labs ordered are listed, but only abnormal results are displayed)  Labs Reviewed  COMPREHENSIVE METABOLIC PANEL - Abnormal; Notable for the following components:      Result Value   Glucose, Bld 110 (*)    All other components within normal limits  CBC - Abnormal; Notable for the following components:   RBC 3.66 (*)    Hemoglobin 10.7 (*)    HCT 33.1 (*)    All other components within normal limits  RESPIRATORY PANEL BY RT PCR (FLU A&B, COVID)  POC OCCULT BLOOD, ED  TYPE AND SCREEN   ____________________________________________   INITIAL IMPRESSION / ASSESSMENT AND PLAN / ED COURSE  PENDA COBLENTZ was evaluated in Emergency Department on 12/29/2019 for the symptoms described in the history of present illness. She was evaluated in the context of the global COVID-19 pandemic, which necessitated consideration that the patient might be at risk for infection with the SARS-CoV-2 virus that causes COVID-19. Institutional protocols and algorithms that pertain to the  evaluation of patients at risk for COVID-19 are in a state of rapid change based on information released by regulatory bodies including the CDC and federal and state organizations. These policies and algorithms were followed during the patient's care in the ED.    Patient is a 74 year old with prior blood transfusions and embolizations required for diverticulum who comes in with bright red blood per rectum.  Hemoglobin has dropped from 12.5-10.7.  I suspect this is from diverticulum.  Did not feel hemorrhoids on examination.  No pain out of proportion of exam to suggest mesenteric ischemia.  No melena to suggest upper GI bleed.  Will discuss with GI anticipate admission given her prior history  Discussed with Dr. Alice Reichert from GI and agrees with admission.        ____________________________________________   FINAL CLINICAL IMPRESSION(S) / ED DIAGNOSES   Final diagnoses:  Gastrointestinal hemorrhage, unspecified gastrointestinal hemorrhage type      MEDICATIONS GIVEN DURING THIS VISIT:  Medications - No data to display   ED Discharge Orders    None       Note:  This document was prepared using Dragon voice recognition software and may include unintentional dictation errors.   Vanessa Deerfield, MD 12/29/19 [phone]

## 2019-12-30 DIAGNOSIS — K219 Gastro-esophageal reflux disease without esophagitis: Secondary | ICD-10-CM

## 2019-12-30 DIAGNOSIS — J449 Chronic obstructive pulmonary disease, unspecified: Secondary | ICD-10-CM

## 2019-12-30 LAB — HEMOGLOBIN AND HEMATOCRIT, BLOOD
HCT: 33.3 % — ABNORMAL LOW (ref 36.0–46.0)
HCT: 31.5 % — ABNORMAL LOW (ref 36.0–46.0)
HCT: 31.7 % — ABNORMAL LOW (ref 36.0–46.0)
Hemoglobin: 10.8 g/dL — ABNORMAL LOW (ref 12.0–15.0)
Hemoglobin: 10.4 g/dL — ABNORMAL LOW (ref 12.0–15.0)
Hemoglobin: 10.5 g/dL — ABNORMAL LOW (ref 12.0–15.0)

## 2019-12-30 LAB — BASIC METABOLIC PANEL
Anion gap: 6 (ref 5–15)
BUN: 12 mg/dL (ref 8–23)
CO2: 29 mmol/L (ref 22–32)
Calcium: 8.7 mg/dL — ABNORMAL LOW (ref 8.9–10.3)
Chloride: 104 mmol/L (ref 98–111)
Creatinine, Ser: 0.65 mg/dL (ref 0.44–1.00)
GFR calc Af Amer: >60 mL/min (ref >60)
GFR calc non Af Amer: >60 mL/min (ref >60)
Glucose, Bld: 87 mg/dL (ref 70–99)
Potassium: 3.4 mmol/L — ABNORMAL LOW (ref 3.5–5.1)
Sodium: 139 mmol/L (ref 135–145)

## 2019-12-30 MED ORDER — TRAMADOL HCL 50 MG PO TABS
50.0000 mg | ORAL_TABLET | Freq: Four times a day (QID) | ORAL | Status: DC | PRN
  Administered 2019-12-30 – 2019-12-31 (×2): 50 mg via ORAL
  Filled 2019-12-30 (×2): qty 1

## 2019-12-30 MED ORDER — BISACODYL 10 MG RE SUPP
10.0000 mg | Freq: Every day | RECTAL | Status: DC | PRN
  Administered 2019-12-30: 10 mg via RECTAL
  Filled 2019-12-30: qty 1

## 2019-12-30 MED ORDER — LOSARTAN POTASSIUM 25 MG PO TABS
25.0000 mg | ORAL_TABLET | Freq: Every day | ORAL | Status: DC
  Administered 2019-12-30 – 2019-12-31 (×2): 25 mg via ORAL
  Filled 2019-12-30 (×3): qty 1

## 2019-12-30 NOTE — Progress Notes (Signed)
[place] at [place] NAME: Sukhdeep Cuadras    MR#:  [id number]  DATE OF BIRTH:  Jun 25, 1946  SUBJECTIVE:  CHIEF COMPLAINT:   Chief Complaint  Patient presents with  . GI Bleeding  Reports some dark tarry stool and minimal bright red blood this morning, hemodynamically stable.  Wants to know when she could go home REVIEW OF SYSTEMS:  Review of Systems  Constitutional: Negative for diaphoresis, fever, malaise/fatigue and weight loss.  HENT: Negative for ear discharge, ear pain, hearing loss, nosebleeds, sore throat and tinnitus.   Eyes: Negative for blurred vision and pain.  Respiratory: Negative for cough, hemoptysis, shortness of breath and wheezing.   Cardiovascular: Negative for chest pain, palpitations, orthopnea and leg swelling.  Gastrointestinal: Positive for blood in stool and melena. Negative for abdominal pain, constipation, diarrhea, heartburn, nausea and vomiting.  Genitourinary: Negative for dysuria, frequency and urgency.  Musculoskeletal: Negative for back pain and myalgias.  Skin: Negative for itching and rash.  Neurological: Negative for dizziness, tingling, tremors, focal weakness, seizures, weakness and headaches.  Psychiatric/Behavioral: Negative for depression. The patient is not nervous/anxious.     DRUG ALLERGIES:   Allergies  Allergen Reactions  . Aspirin Other (See Comments)    Diverticulosis  . Lisinopril Cough   VITALS:  Blood pressure (!) 163/91, pulse 60, temperature 97.8 F (36.6 C), temperature source Oral, resp. rate 18, height 5\' 6"  (1.676 m), weight 86.6 kg, SpO2 99 %. PHYSICAL EXAMINATION:  Physical Exam HENT:     Head: Normocephalic and atraumatic.  Eyes:     Conjunctiva/sclera: Conjunctivae normal.     Pupils: Pupils are equal, round, and reactive to light.  Neck:     Thyroid: No thyromegaly.     Trachea: No tracheal deviation.  Cardiovascular:     Rate and Rhythm: Normal rate and regular rhythm.   Heart sounds: Normal heart sounds.  Pulmonary:     Effort: Pulmonary effort is normal. No respiratory distress.     Breath sounds: Normal breath sounds. No wheezing.  Chest:     Chest wall: No tenderness.  Abdominal:     General: Bowel sounds are normal. There is no distension.     Palpations: Abdomen is soft.     Tenderness: There is no abdominal tenderness.  Musculoskeletal:        General: Normal range of motion.     Cervical back: Normal range of motion and neck supple.  Skin:    General: Skin is warm and dry.     Findings: No rash.  Neurological:     Mental Status: She is alert and oriented to person, place, and time.     Cranial Nerves: No cranial nerve deficit.    LABORATORY PANEL:  Female CBC Recent Labs  Lab 12/29/19 1018 12/29/19 1450 12/30/19 1447  WBC 7.0  --   --   HGB 10.7*   < > 10.5*  HCT 33.1*   < > 31.7*  PLT 176  --   --    < > = values in this interval not displayed.   ------------------------------------------------------------------------------------------------------------------ Chemistries  Recent Labs  Lab 12/29/19 1018 12/29/19 1018 12/30/19 0315  NA 142   < > 139  K 4.2   < > 3.4*  CL 101   < > 104  CO2 27   < > 29  GLUCOSE 110*   < > 87  BUN 21   < > 12  CREATININE 0.68   < > 0.65  CALCIUM 9.2   < > 8.7*  AST 30  --   --   ALT 20  --   --   ALKPHOS 77  --   --   BILITOT 0.9  --   --    < > = values in this interval not displayed.   RADIOLOGY:  No results found. ASSESSMENT AND PLAN:  74 year old female with known history of abdominal aortic atherosclerosis, HTN, and HLD admitted for diverticular bleed in past presents with hematochezia.    Rectal bleeding - history of diverticular bleeding s/p positive bleeding scan and embolization in 2015 and 2009 - symptoms are consistent with diverticular bleeding.  She has had a 2 g drop in her hemoglobin over the past 2 months.  -She has had no recurrent bleeding since being in the  ER -GI recommending conservative measure. -Clear liquid diet and advance as tolerated -If patient has recurrent episodes of hematochezia, she will need a tagged RBC scan.  If positive, we will consult vascular surgery. - She is currently hemodynamically stable  HTN continue her bisoprolol/HCTZ Resume losartan as blood pressure is still running high  Hypokalemia Replete and recheck   DVT prophylaxis: SCDs Family Communication: None.  Discussed with patient Disposition Plan:  Came from home Will be discharged back home once clinically improved Barriers to DC -none, watching her closely for any further rectal bleed and hemodynamic monitoring for another 24 to 48 hours.  All the records are reviewed and case discussed with Care Management/Social Worker. Management plans discussed with the patient, nursing and they are in agreement.  CODE STATUS: Full Code  TOTAL TIME TAKING CARE OF THIS PATIENT: 35 minutes.   More than 50% of the time was spent in counseling/coordination of care: YES  POSSIBLE D/C IN 1-2 DAYS, DEPENDING ON CLINICAL CONDITION.   Max Sane M.D on 12/30/2019 at 4:13 PM  [facility]   CC: Primary care physician; [place]  Note: This dictation was prepared with Dragon dictation along with smaller phrase technology. Any transcriptional errors that result from this process are unintentional.

## 2019-12-31 LAB — TYPE AND SCREEN
ABO/RH(D): B POS
Antibody Screen: POSITIVE
Unit division: 0
Unit division: 0

## 2019-12-31 LAB — BASIC METABOLIC PANEL
Anion gap: 5 (ref 5–15)
BUN: 8 mg/dL (ref 8–23)
CO2: 28 mmol/L (ref 22–32)
Calcium: 8.3 mg/dL — ABNORMAL LOW (ref 8.9–10.3)
Chloride: 107 mmol/L (ref 98–111)
Creatinine, Ser: 0.67 mg/dL (ref 0.44–1.00)
GFR calc Af Amer: >60 mL/min (ref >60)
GFR calc non Af Amer: >60 mL/min (ref >60)
Glucose, Bld: 86 mg/dL (ref 70–99)
Potassium: 3.6 mmol/L (ref 3.5–5.1)
Sodium: 140 mmol/L (ref 135–145)

## 2019-12-31 LAB — BPAM RBC
Blood Product Expiration Date: 202103102359
Blood Product Expiration Date: 202103102359
Unit Type and Rh: 7300
Unit Type and Rh: 7300

## 2019-12-31 LAB — CBC
HCT: 30.8 % — ABNORMAL LOW (ref 36.0–46.0)
Hemoglobin: 10 g/dL — ABNORMAL LOW (ref 12.0–15.0)
MCH: 29.7 pg (ref 26.0–34.0)
MCHC: 32.5 g/dL (ref 30.0–36.0)
MCV: 91.4 fL (ref 80.0–100.0)
Platelets: 180 10*3/uL (ref 150–400)
RBC: 3.37 MIL/uL — ABNORMAL LOW (ref 3.87–5.11)
RDW: 13.2 % (ref 11.5–15.5)
WBC: 5.4 10*3/uL (ref 4.0–10.5)
nRBC: 0 % (ref 0.0–0.2)

## 2019-12-31 MED ORDER — DICYCLOMINE HCL 10 MG PO CAPS: 10.0000 mg | ORAL_CAPSULE | Freq: Four times a day (QID) | ORAL | 0 refills | Status: DC | PRN

## 2019-12-31 NOTE — Discharge Instructions (Signed)

## 2019-12-31 NOTE — Progress Notes (Signed)
Discharge Summary  Patient ID: Holly Green MRN: [id number] DOB/AGE: 1946/10/27 74 y.o.  Admit date: 12/29/2019 DX: Acute lower GI bleeding [K92.2] Gastrointestinal hemorrhage, unspecified gastrointestinal hemorrhage type [K92.2]   Discharge date: 12/31/2019 Method of transport: Discharge address: [place] [address]   Discharge Exam: Blood pressure 139/77, pulse 61, temperature 98.4 F (36.9 C), temperature source Oral, resp. rate 20, height 5\' 6"  (1.676 m), weight 86.6 kg, SpO2 99 %. IV removed. Belongings gathered. AVS was provided to the patient which included instructions that addressed activity level, diet, discharge medications, follow-up appointments, and what to do if symptoms worsen. All questions answered for patient clarification.  Patient drove herself to the ED and she plans to drive herself home.   [place]. Schedule an appointment as soon as possible for a visit in 1 week.   Specialty: Family Medicine Why: Please call office to make your follow up. Contact information: [place] [address] [id number]        Virgel Manifold, MD. Schedule an appointment as soon as possible for a visit in 2 weeks.   Specialty: Gastroenterology Why: The office is closed please call the office to make your follow up. Contact information: [place] [address] [phone]           Signed: Lewie Chamber 12/31/2019, 4:02 PM

## 2019-12-31 NOTE — Care Management Important Message (Signed)
Important Message  Patient Details  Name: Holly Green MRN: [id number] Date of Birth: 06-Dec-1945   Medicare Important Message Given:  Yes  Initial Medicare IM given by Patient Access Associate on 12/30/2019 at 9:29am.     Dannette Barbara 12/31/2019, 8:17 AM

## 2020-01-02 NOTE — Discharge Summary (Signed)
[place] at [place] NAME: Holly Green    MR#:  [id number]  DATE OF BIRTH:  18-May-1946  DATE OF ADMISSION:  12/29/2019   ADMITTING PHYSICIAN: Vashti Hey, MD  DATE OF DISCHARGE: 12/31/2019  5:48 PM  PRIMARY CARE PHYSICIAN: [facility]   ADMISSION DIAGNOSIS:  Acute lower GI bleeding [K92.2] Gastrointestinal hemorrhage, unspecified gastrointestinal hemorrhage type [K92.2] DISCHARGE DIAGNOSIS:  Principal Problem:   Gastrointestinal hemorrhage Active Problems:   Essential hypertension   GERD (gastroesophageal reflux disease)   Diverticulosis of large intestine without diverticulitis   COAD (chronic obstructive airways disease) ([place])  SECONDARY DIAGNOSIS:   Past Medical History:  Diagnosis Date  . Abdominal aortic atherosclerosis ([place]) 03/05/2017   CT scan April 2018  . Arthritis   . Bleeding disorder ([place])   . Diverticulitis 2015  . GERD (gastroesophageal reflux disease)   . History of kidney stones   . History of stomach ulcers   . Hyperlipidemia   . Hypertension   . Osteoporosis   . Pre-diabetes   . Prediabetes 10/15/2015  . Spondylolisthesis at L4-L5 level 03/07/2017   HOSPITAL COURSE:  74 year old female with known history of abdominal aortic atherosclerosis, HTN, and HLD admitted for diverticular bleed in past presents with hematochezia.   Rectal bleeding - history of diverticular bleeding s/p positive bleeding scan and embolization in 2015 and 2009 - symptoms are consistent with diverticular bleeding. She has had a 2 g drop in her hemoglobin over the past 2 months.  -She has had no recurrent bleeding while in the hospital -GI recommending conservative measure. -tolerated diet without any recurrence of rectal bleed.  -remained hemodynamically stable  HTN Stable on home meds  Hypokalemia Repleted and resolved DISCHARGE CONDITIONS:  stable CONSULTS OBTAINED:  Treatment Team:  Efrain Sella, MD DRUG ALLERGIES:   Allergies  Allergen Reactions  . Aspirin Other (See Comments)    Diverticulosis  . Lisinopril Cough   DISCHARGE MEDICATIONS:   Allergies as of 12/31/2019      Reactions   Aspirin Other (See Comments)   Diverticulosis   Lisinopril Cough      Medication List    TAKE these medications   acetaminophen 650 MG CR tablet Commonly known as: TYLENOL Take 650-1,300 mg by mouth every 8 (eight) hours as needed for pain.   alendronate 70 MG tablet Commonly known as: FOSAMAX TAKE 1 TABLET BY MOUTH ONCE A WEEK (EVERY  7  DAYS).  TAKE  WITH  A  FULL  GLASS  OF  WATER  ON  EMPTY  STOMACH What changed: See the new instructions.   bisoprolol-hydrochlorothiazide 10-6.25 MG tablet Commonly known as: ZIAC Take 1 tablet by mouth daily.   Calcium-Vitamin D 600-400 MG-UNIT Tabs Take 1 tablet by mouth daily.   dicyclomine 10 MG capsule Commonly known as: Bentyl Take 1 capsule (10 mg total) by mouth 4 (four) times daily as needed for up to 14 days for spasms.   loratadine 10 MG tablet Commonly known as: CLARITIN Take 1 tablet (10 mg total) by mouth daily.   losartan 25 MG tablet Commonly known as: COZAAR Take 1 tablet by mouth once daily   lovastatin 40 MG tablet Commonly known as: MEVACOR Take 1 tablet (40 mg total) by mouth at bedtime.   polyethylene glycol 17 g packet Commonly known as: MIRALAX / GLYCOLAX Take 17 g by mouth daily.      DISCHARGE INSTRUCTIONS:   DIET:  Regular diet DISCHARGE  CONDITION:  Good ACTIVITY:  Activity as tolerated OXYGEN:  Home Oxygen: No.  Oxygen Delivery: room air DISCHARGE LOCATION:  home   If you experience worsening of your admission symptoms, develop shortness of breath, life threatening emergency, suicidal or homicidal thoughts you must seek medical attention immediately by calling 911 or calling your MD immediately  if symptoms less severe.  You Must read complete instructions/literature along with all the  possible adverse reactions/side effects for all the Medicines you take and that have been prescribed to you. Take any new Medicines after you have completely understood and accpet all the possible adverse reactions/side effects.   Please note  You were cared for by a hospitalist during your hospital stay. If you have any questions about your discharge medications or the care you received while you were in the hospital after you are discharged, you can call the unit and asked to speak with the hospitalist on call if the hospitalist that took care of you is not available. Once you are discharged, your primary care physician will handle any further medical issues. Please note that NO REFILLS for any discharge medications will be authorized once you are discharged, as it is imperative that you return to your primary care physician (or establish a relationship with a primary care physician if you do not have one) for your aftercare needs so that they can reassess your need for medications and monitor your lab values.    On the day of Discharge:  VITAL SIGNS:  Blood pressure 139/77, pulse 61, temperature 98.4 F (36.9 C), temperature source Oral, resp. rate 20, height 5\' 6"  (1.676 m), weight 86.6 kg, SpO2 99 %. PHYSICAL EXAMINATION:  GENERAL:  74 y.o.-year-old patient lying in the bed with no acute distress.  EYES: Pupils equal, round, reactive to light and accommodation. No scleral icterus. Extraocular muscles intact.  HEENT: Head atraumatic, normocephalic. Oropharynx and nasopharynx clear.  NECK:  Supple, no jugular venous distention. No thyroid enlargement, no tenderness.  LUNGS: Normal breath sounds bilaterally, no wheezing, rales,rhonchi or crepitation. No use of accessory muscles of respiration.  CARDIOVASCULAR: S1, S2 normal. No murmurs, rubs, or gallops.  ABDOMEN: Soft, non-tender, non-distended. Bowel sounds present. No organomegaly or mass.  EXTREMITIES: No pedal edema, cyanosis, or clubbing.   NEUROLOGIC: Cranial nerves II through XII are intact. Muscle strength 5/5 in all extremities. Sensation intact. Gait not checked.  PSYCHIATRIC: The patient is alert and oriented x 3.  SKIN: No obvious rash, lesion, or ulcer.  DATA REVIEW:   CBC Recent Labs  Lab 12/31/19 0543  WBC 5.4  HGB 10.0*  HCT 30.8*  PLT 180    Chemistries  Recent Labs  Lab 12/29/19 1018 12/30/19 0315 12/31/19 0543  NA 142   < > 140  K 4.2   < > 3.6  CL 101   < > 107  CO2 27   < > 28  GLUCOSE 110*   < > 86  BUN 21   < > 8  CREATININE 0.68   < > 0.67  CALCIUM 9.2   < > 8.3*  AST 30  --   --   ALT 20  --   --   ALKPHOS 77  --   --   BILITOT 0.9  --   --    < > = values in this interval not displayed.     [place]. Schedule an appointment as soon as possible for a visit  in 1 week.   Specialty: Family Medicine Why: Please call office to make your follow up. Contact information: [place] [address] [id number]        Virgel Manifold, MD. Schedule an appointment as soon as possible for a visit in 2 weeks.   Specialty: Gastroenterology Why: The office is closed please call the office to make your follow up. Contact information: [place] [address] [phone]            Management plans discussed with the patient, family and they are in agreement.  CODE STATUS: Prior   TOTAL TIME TAKING CARE OF THIS PATIENT: 45 minutes.    Max Sane M.D on 01/02/2020 at 5:09 PM  [facility]   CC: Primary care physician; [place]   Note: This dictation was prepared with Dragon dictation along with smaller phrase technology. Any transcriptional errors that result from this process are unintentional.

## 2020-01-03 ENCOUNTER — Telehealth: Payer: Self-pay

## 2020-01-03 NOTE — Telephone Encounter (Signed)
Transition Care Management Follow-up Telephone Call  Date of discharge and from where: 12/31/19 [facility]  How have you been since you were released from the hospital? Pt states she is feeling better  Any questions or concerns? No   Items Reviewed:  Did the pt receive and understand the discharge instructions provided? Yes   Medications obtained and verified? Yes   Any new allergies since your discharge? No   Dietary orders reviewed? Yes  Do you have support at home? Yes   Functional Questionnaire: (I = Independent and D = Dependent) ADLs: I  Bathing/Dressing- I  Meal Prep- I  Eating- I  Maintaining continence- I  Transferring/Ambulation- I  Managing Meds- I  Follow up appointments reviewed:   PCP Hospital f/u appt confirmed? Yes  Scheduled to see Dr. Ancil Boozer on 01/12/20 @ 2:20.  [facility] f/u appt confirmed? Yes  Scheduled to see Dr. Vicente Males on 02/07/20.  Are transportation arrangements needed? No   If their condition worsens, is the pt aware to call PCP or go to the Emergency Dept.? Yes  Was the patient provided with contact information for the PCP's office or ED? Yes  Was to pt encouraged to call back with questions or concerns? Yes

## 2020-01-12 ENCOUNTER — Other Ambulatory Visit: Payer: Self-pay

## 2020-01-12 ENCOUNTER — Encounter: Payer: Self-pay | Admitting: Family Medicine

## 2020-01-12 ENCOUNTER — Ambulatory Visit (INDEPENDENT_AMBULATORY_CARE_PROVIDER_SITE_OTHER): Payer: Medicare Other | Admitting: Family Medicine

## 2020-01-12 VITALS — BP 136/86 | HR 81 | Temp 97.6°F | Resp 14 | Ht 66.0 in | Wt 186.6 lb

## 2020-01-12 DIAGNOSIS — Z8719 Personal history of other diseases of the digestive system: Secondary | ICD-10-CM | POA: Diagnosis not present

## 2020-01-12 DIAGNOSIS — Z09 Encounter for follow-up examination after completed treatment for conditions other than malignant neoplasm: Secondary | ICD-10-CM | POA: Diagnosis not present

## 2020-01-12 DIAGNOSIS — D62 Acute posthemorrhagic anemia: Secondary | ICD-10-CM

## 2020-01-12 DIAGNOSIS — K922 Gastrointestinal hemorrhage, unspecified: Secondary | ICD-10-CM

## 2020-01-12 MED ORDER — FERROUS SULFATE 325 (65 FE) MG PO TBEC: 325.0000 mg | DELAYED_RELEASE_TABLET | Freq: Every day | ORAL | 0 refills | Status: DC

## 2020-01-12 NOTE — Progress Notes (Signed)
Name: Holly Green   MRN: [id number]    DOB: [birth year]   Date:01/12/2020       Progress Note  Subjective  Chief Complaint  Chief Complaint  Patient presents with  . Hospitalization Follow-up  . GI Bleeding    HPI  Hospital Discharge Follow up: she went to [facility] on 12/29/2019 because she developed acute onset of abdominal pain and 3 episodes of bloody stools and clots that started hours prior to going to [facility]. She has a history of diverticular disease and previous lower GI bleed that required embolization . Upon arrival her HCT was down from 37 to 33.1 , she remained hemodynamically stable, and HCT stable throughout her stay. She was discharged home on 12/31/2019, she states once she got home she took some Miralax and stools are now brown in color. Last episode of blood in stools was 6 days ago. She still has some abdominal pain before, during or right after a bowel movement, pain is described as dull now, nothing like it was during hospital stay. She still has some Bentyl to take prn. She is supposed to have a follow up with GI , next follow up is on March 17th, but is on a cancellation list. She denies dizziness, pica or lack of appetite, no fever or chills. BP was elevated while at [facility] but is back to normal now   Patient Active Problem List   Diagnosis Date Noted  . Gastrointestinal hemorrhage 12/29/2019  . Left hip pain 10/15/2019  . Obesity (BMI 30.0-34.9) 08/10/2018  . Emphysema of lung ([place]) 08/10/2018  . Allergic rhinitis 08/10/2018  . COAD (chronic obstructive airways disease) ([place]) 08/03/2018  . Primary osteoarthritis of right knee 07/17/2018  . Chondrocalcinosis 07/17/2018  . 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 Anemia 12/17/2017  . Diverticulosis of large intestine without diverticulitis   . History of GI bleed 11/06/2017  . Left flank pain, chronic 03/07/2017  . Spondylolisthesis at L4-L5 level 03/07/2017  . Abdominal aortic  atherosclerosis ([place]) 03/05/2017  . Simple cyst of kidney 03/05/2017  . Benign liver cyst 03/05/2017  . Medication monitoring encounter 11/22/2016  . GERD (gastroesophageal reflux disease) 08/07/2016  . Osteoporosis 05/07/2016  . Cardiac murmur 05/07/2016  . Prediabetes 10/15/2015  . Hyperlipidemia 10/15/2015  . Essential hypertension 10/15/2015  . Diverticulosis of colon, acquired 10/15/2015    Past Surgical History:  Procedure Laterality Date  . ABDOMINAL HYSTERECTOMY    . BACK SURGERY  2010  . BREAST CYST ASPIRATION Right 1990's  . COLONOSCOPY Left 11/09/2017   Procedure: COLONOSCOPY;  Surgeon: Virgel Manifold, MD;  Location: [facility] ENDOSCOPY;  Service: Endoscopy;  Laterality: Left;  . CYSTO WITH HYDRODISTENSION N/A 08/15/2017   Procedure: CYSTOSCOPY/HYDRODISTENSION;  Surgeon: Nickie Retort, MD;  Location: [facility] ORS;  Service: Urology;  Laterality: N/A;  . ESOPHAGOGASTRODUODENOSCOPY Left 11/09/2017   Procedure: ESOPHAGOGASTRODUODENOSCOPY (EGD);  Surgeon: Virgel Manifold, MD;  Location: [facility] ENDOSCOPY;  Service: Endoscopy;  Laterality: Left;  . FOOT SURGERY Right     Family History  Problem Relation Age of Onset  . Hypertension Brother   . Heart disease Brother   . Heart failure Brother   . Hypertension Mother   . Stroke Mother   . Stroke Father   . Prostate cancer Father   . Aneurysm Sister   . Breast cancer Sister 2  . Liver disease Maternal Aunt   . Heart failure Paternal Aunt   . Heart disease Paternal Uncle   .  Heart attack Brother   . Heart disease Brother   . Breast cancer Maternal Aunt   . Heart disease Paternal Aunt   . Kidney cancer Neg Hx   . Bladder Cancer Neg Hx     Social History   Tobacco Use  . Smoking status: Former Research scientist (life sciences)  . Smokeless tobacco: Never Used  . Tobacco comment: teenage years about 1 year  Substance Use Topics  . Alcohol use: No    Alcohol/week: 0.0 standard drinks  . Drug use: No     Current Outpatient  Medications:  .  acetaminophen (TYLENOL) 650 MG CR tablet, Take 650-1,300 mg by mouth every 8 (eight) hours as needed for pain., Disp: , Rfl:  .  alendronate (FOSAMAX) 70 MG tablet, TAKE 1 TABLET BY MOUTH ONCE A WEEK (EVERY  7  DAYS).  TAKE  WITH  A  FULL  GLASS  OF  WATER  ON  EMPTY  STOMACH (Patient taking differently: Take 70 mg by mouth every Sunday. ), Disp: 4 tablet, Rfl: 0 .  bisoprolol-hydrochlorothiazide (ZIAC) 10-6.25 MG tablet, Take 1 tablet by mouth daily., Disp: 90 tablet, Rfl: 1 .  Calcium Carb-Cholecalciferol (CALCIUM-VITAMIN D) 600-400 MG-UNIT TABS, Take 1 tablet by mouth daily., Disp: , Rfl:  .  dicyclomine (BENTYL) 10 MG capsule, Take 1 capsule (10 mg total) by mouth 4 (four) times daily as needed for up to 14 days for spasms., Disp: 56 capsule, Rfl: 0 .  loratadine (CLARITIN) 10 MG tablet, Take 1 tablet (10 mg total) by mouth daily., Disp: 30 tablet, Rfl: 11 .  losartan (COZAAR) 25 MG tablet, Take 1 tablet by mouth once daily (Patient taking differently: Take 25 mg by mouth daily. ), Disp: 90 tablet, Rfl: 0 .  lovastatin (MEVACOR) 40 MG tablet, Take 1 tablet (40 mg total) by mouth at bedtime., Disp: 90 tablet, Rfl: 3 .  polyethylene glycol (MIRALAX / GLYCOLAX) packet, Take 17 g by mouth daily., Disp: , Rfl:   Allergies  Allergen Reactions  . Aspirin Other (See Comments)    Diverticulosis  . Lisinopril Cough    I personally reviewed active problem list, medication list, allergies, family history, social history with the patient/caregiver today.   ROS  Constitutional: Negative for fever or weight change.  Respiratory: Negative for cough and shortness of breath.   Cardiovascular: Positive for mild  abdominal pain, no bowel changes.  Musculoskeletal: Negative for gait problem or joint swelling.  Skin: Negative for rash.  Neurological: Negative for dizziness or headache.  No other specific complaints in a complete review of systems (except as listed in HPI  above).  Objective  Vitals:   01/12/20 1413  BP: 136/86  Pulse: 81  Resp: 14  Temp: 97.6 F (36.4 C)  TempSrc: Temporal  SpO2: 96%  Weight: 186 lb 9.6 oz (84.6 kg)  Height: 5\' 6"  (1.676 m)    Body mass index is 30.12 kg/m.  Physical Exam  Constitutional: Patient appears well-developed and well-nourished. Obese  No distress.  HEENT: head atraumatic, normocephalic, pupils equal and reactive to light Cardiovascular: Normal rate, regular rhythm and normal heart sounds.  No murmur heard. Trace BLE edema. Pulmonary/Chest: Effort normal and breath sounds normal. No respiratory distress. Abdominal: Soft.  There is no tenderness. Normal bowel sounds Psychiatric: Patient has a normal mood and affect. behavior is normal. Judgment and thought content normal.  Recent Results (from the past 2160 hour(s))  CBC with Differential/Platelet     Status: None   Collection Time:  10/15/19 12:05 PM  Result Value Ref Range   WBC 3.8 3.4 - 10.8 x10E3/uL   RBC 4.21 3.77 - 5.28 x10E6/uL   Hemoglobin 12.5 11.1 - 15.9 g/dL   Hematocrit 37.0 34.0 - 46.6 %   MCV 88 79 - 97 fL   MCH 29.7 26.6 - 33.0 pg   MCHC 33.8 31.5 - 35.7 g/dL   RDW 12.2 11.7 - 15.4 %   Platelets 216 150 - 450 x10E3/uL   Neutrophils 49 Not Estab. %   Lymphs 38 Not Estab. %   Monocytes 9 Not Estab. %   Eos 3 Not Estab. %   Basos 1 Not Estab. %   Neutrophils Absolute 1.9 1.4 - 7.0 x10E3/uL   Lymphocytes Absolute 1.4 0.7 - 3.1 x10E3/uL   Monocytes Absolute 0.3 0.1 - 0.9 x10E3/uL   EOS (ABSOLUTE) 0.1 0.0 - 0.4 x10E3/uL   Basophils Absolute 0.0 0.0 - 0.2 x10E3/uL   Immature Granulocytes 0 Not Estab. %   Immature Grans (Abs) 0.0 0.0 - 0.1 x10E3/uL  Comprehensive metabolic panel     Status: Abnormal   Collection Time: 10/15/19 12:05 PM  Result Value Ref Range   Glucose 87 65 - 99 mg/dL   BUN 15 8 - 27 mg/dL   Creatinine, Ser 0.62 0.57 - 1.00 mg/dL   GFR calc non Af Amer 90 >59 mL/min/1.73   GFR calc Af Amer 104 >59  mL/min/1.73   BUN/Creatinine Ratio 24 12 - 28   Sodium 141 134 - 144 mmol/L   Potassium 3.8 3.5 - 5.2 mmol/L   Chloride 101 96 - 106 mmol/L   CO2 21 20 - 29 mmol/L   Calcium 9.3 8.7 - 10.3 mg/dL   Total Protein 6.7 6.0 - 8.5 g/dL   Albumin 4.3 3.7 - 4.7 g/dL   Globulin, Total 2.4 1.5 - 4.5 g/dL   Albumin/Globulin Ratio 1.8 1.2 - 2.2   Bilirubin Total 1.4 (H) 0.0 - 1.2 mg/dL   Alkaline Phosphatase 97 39 - 117 IU/L   AST 23 0 - 40 IU/L   ALT 14 0 - 32 IU/L  VITAMIN D 25 Hydroxy (Vit-D Deficiency, Fractures)     Status: None   Collection Time: 10/15/19 12:05 PM  Result Value Ref Range   Vit D, 25-Hydroxy 43.8 30.0 - 100.0 ng/mL    Comment: Vitamin D deficiency has been defined by the Institute of Medicine and an Endocrine Society practice guideline as a level of serum 25-OH vitamin D less than 20 ng/mL (1,2). The Endocrine Society went on to further define vitamin D insufficiency as a level between 21 and 29 ng/mL (2). 1. IOM (Institute of Medicine). 2010. Dietary reference    intakes for calcium and D. [place]: The    [employer]. 2. Holick MF, [place], Bischoff-Ferrari HA, et al.    Evaluation, treatment, and prevention of vitamin D    deficiency: an Endocrine Society clinical practice    guideline. JCEM. 2011 Jul; 96(7):1911-30.   Lipid panel     Status: None   Collection Time: 10/15/19 12:05 PM  Result Value Ref Range   Cholesterol, Total 156 100 - 199 mg/dL   Triglycerides 43 0 - 149 mg/dL   HDL 79 >39 mg/dL   VLDL Cholesterol Cal 10 5 - 40 mg/dL   LDL Chol Calc (NIH) 67 0 - 99 mg/dL   Chol/HDL Ratio 2.0 0.0 - 4.4 ratio    Comment:  T. Chol/HDL Ratio                                             Men  Women                               1/2 Avg.Risk  3.4    3.3                                   Avg.Risk  5.0    4.4                                2X Avg.Risk  9.6    7.1                                3X Avg.Risk 23.4    11.0   Hemoglobin A1c     Status: Abnormal   Collection Time: 10/15/19 12:05 PM  Result Value Ref Range   Hgb A1c MFr Bld 5.8 (H) 4.8 - 5.6 %    Comment:          Prediabetes: 5.7 - 6.4          Diabetes: >6.4          Glycemic control for adults with diabetes: <7.0    Est. average glucose Bld gHb Est-mCnc 120 mg/dL  Comprehensive metabolic panel     Status: Abnormal   Collection Time: 12/29/19 10:18 AM  Result Value Ref Range   Sodium 142 135 - 145 mmol/L   Potassium 4.2 3.5 - 5.1 mmol/L   Chloride 101 98 - 111 mmol/L   CO2 27 22 - 32 mmol/L   Glucose, Bld 110 (H) 70 - 99 mg/dL   BUN 21 8 - 23 mg/dL   Creatinine, Ser 0.68 0.44 - 1.00 mg/dL   Calcium 9.2 8.9 - 10.3 mg/dL   Total Protein 6.9 6.5 - 8.1 g/dL   Albumin 3.6 3.5 - 5.0 g/dL   AST 30 15 - 41 U/L   ALT 20 0 - 44 U/L   Alkaline Phosphatase 77 38 - 126 U/L   Total Bilirubin 0.9 0.3 - 1.2 mg/dL   GFR calc non Af Amer >60 >60 mL/min   GFR calc Af Amer >60 >60 mL/min   Anion gap 14 5 - 15    Comment: Performed at [facility], [address] [place], [place] [address]  CBC     Status: Abnormal   Collection Time: 12/29/19 10:18 AM  Result Value Ref Range   WBC 7.0 4.0 - 10.5 K/uL   RBC 3.66 (L) 3.87 - 5.11 MIL/uL   Hemoglobin 10.7 (L) 12.0 - 15.0 g/dL   HCT 33.1 (L) 36.0 - 46.0 %   MCV 90.4 80.0 - 100.0 fL   MCH 29.2 26.0 - 34.0 pg   MCHC 32.3 30.0 - 36.0 g/dL   RDW 13.1 11.5 - 15.5 %   Platelets 176 150 - 400 K/uL   nRBC 0.0 0.0 - 0.2 %    Comment: Performed at [facility], [address], [place], [place] [address]  Respiratory Panel  by RT PCR (Flu A&B, Covid) - Nasopharyngeal Swab     Status: None   Collection Time: 12/29/19 12:09 PM   Specimen: Nasopharyngeal Swab  Result Value Ref Range   SARS Coronavirus 2 by RT PCR NEGATIVE NEGATIVE    Comment: (NOTE) SARS-CoV-2 target nucleic acids are NOT DETECTED. The SARS-CoV-2 RNA is generally detectable in upper respiratoy specimens during the  acute phase of infection. The lowest concentration of SARS-CoV-2 viral copies this assay can detect is 131 copies/mL. A negative result does not preclude SARS-Cov-2 infection and should not be used as the sole basis for treatment or other patient management decisions. A negative result may occur with  improper specimen collection/handling, submission of specimen other than nasopharyngeal swab, presence of viral mutation(s) within the areas targeted by this assay, and inadequate number of viral copies (<131 copies/mL). A negative result must be combined with clinical observations, patient history, and epidemiological information. The expected result is Negative. Fact Sheet for Patients:  PinkCheek.be Fact Sheet for Healthcare Providers:  GravelBags.it This test is not yet ap proved or cleared by the [place] FDA and  has been authorized for detection and/or diagnosis of SARS-CoV-2 by FDA under an Emergency Use Authorization (EUA). This EUA will remain  in effect (meaning this test can be used) for the duration of the COVID-19 declaration under Section 564(b)(1) of the Act, 21 U.S.C. section 360bbb-3(b)(1), unless the authorization is terminated or revoked sooner.    Influenza A by PCR NEGATIVE NEGATIVE   Influenza B by PCR NEGATIVE NEGATIVE    Comment: (NOTE) The Xpert Xpress SARS-CoV-2/FLU/RSV assay is intended as an aid in  the diagnosis of influenza from Nasopharyngeal swab specimens and  should not be used as a sole basis for treatment. Nasal washings and  aspirates are unacceptable for Xpert Xpress SARS-CoV-2/FLU/RSV  testing. Fact Sheet for Patients: PinkCheek.be Fact Sheet for Healthcare Providers: GravelBags.it This test is not yet approved or cleared by the [place] FDA and  has been authorized for detection and/or diagnosis of SARS-CoV-2 by  FDA  under an Emergency Use Authorization (EUA). This EUA will remain  in effect (meaning this test can be used) for the duration of the  Covid-19 declaration under Section 564(b)(1) of the Act, 21  U.S.C. section 360bbb-3(b)(1), unless the authorization is  terminated or revoked. Performed at [facility], [address] [place], [place] [address]   Type and screen     Status: None   Collection Time: 12/29/19  2:50 PM  Result Value Ref Range   ABO/RH(D) B POS    Antibody Screen POS    Sample Expiration 01/01/2020,2359    Antibody Identification NON SPECIFIC ANTIBODY REACTIVITY    Unit Number [id number]    Blood Component Type RED CELLS,LR    Unit division 00    Status of Unit REL FROM [facility]    Transfusion Status OK TO TRANSFUSE    Crossmatch Result COMPATIBLE    Unit Number [id number]    Blood Component Type RED CELLS,LR    Unit division 00    Status of Unit REL FROM [facility] [address]    Transfusion Status OK TO TRANSFUSE    Crossmatch Result COMPATIBLE   Hemoglobin and hematocrit, blood     Status: Abnormal   Collection Time: 12/29/19  2:50 PM  Result Value Ref Range   Hemoglobin 11.2 (L) 12.0 - 15.0 g/dL   HCT 34.4 (L) 36.0 - 46.0 %    Comment: Performed at [facility],  [place], [place] [address]  BPAM RBC     Status: None   Collection Time: 12/29/19  2:50 PM  Result Value Ref Range   Blood Product Unit Number [id number]    PRODUCT CODE [id number]    Unit Type and Rh 7300    Blood Product Expiration Date [id number]    Blood Product Unit Number [id number]    PRODUCT CODE [id number]    Unit Type and Rh 7300    Blood Product Expiration Date [id number]   Hemoglobin and hematocrit, blood     Status: Abnormal   Collection Time: 12/29/19  8:51 PM  Result Value Ref Range   Hemoglobin 11.3 (L) 12.0 - 15.0 g/dL   HCT 34.5 (L) 36.0 - 46.0 %    Comment: Performed at [facility], [address] [place], [place] [address]  Hemoglobin  and hematocrit, blood     Status: Abnormal   Collection Time: 12/30/19  3:15 AM  Result Value Ref Range   Hemoglobin 10.8 (L) 12.0 - 15.0 g/dL   HCT 33.3 (L) 36.0 - 46.0 %    Comment: Performed at [facility], [address] [place], [place] [id number]  Basic metabolic panel     Status: Abnormal   Collection Time: 12/30/19  3:15 AM  Result Value Ref Range   Sodium 139 135 - 145 mmol/L   Potassium 3.4 (L) 3.5 - 5.1 mmol/L   Chloride 104 98 - 111 mmol/L   CO2 29 22 - 32 mmol/L   Glucose, Bld 87 70 - 99 mg/dL   BUN 12 8 - 23 mg/dL   Creatinine, Ser 0.65 0.44 - 1.00 mg/dL   Calcium 8.7 (L) 8.9 - 10.3 mg/dL   GFR calc non Af Amer >60 >60 mL/min   GFR calc Af Amer >60 >60 mL/min   Anion gap 6 5 - 15    Comment: Performed at [facility], [address] [place], [place] [address]  Hemoglobin and hematocrit, blood     Status: Abnormal   Collection Time: 12/30/19  9:41 AM  Result Value Ref Range   Hemoglobin 10.4 (L) 12.0 - 15.0 g/dL   HCT 31.5 (L) 36.0 - 46.0 %    Comment: Performed at [facility], [address] [place], [place] [address]  Hemoglobin and hematocrit, blood     Status: Abnormal   Collection Time: 12/30/19  2:47 PM  Result Value Ref Range   Hemoglobin 10.5 (L) 12.0 - 15.0 g/dL   HCT 31.7 (L) 36.0 - 46.0 %    Comment: Performed at [facility], [address] [place], [place] [address]  CBC     Status: Abnormal   Collection Time: 12/31/19  5:43 AM  Result Value Ref Range   WBC 5.4 4.0 - 10.5 K/uL   RBC 3.37 (L) 3.87 - 5.11 MIL/uL   Hemoglobin 10.0 (L) 12.0 - 15.0 g/dL   HCT 30.8 (L) 36.0 - 46.0 %   MCV 91.4 80.0 - 100.0 fL   MCH 29.7 26.0 - 34.0 pg   MCHC 32.5 30.0 - 36.0 g/dL   RDW 13.2 11.5 - 15.5 %   Platelets 180 150 - 400 K/uL   nRBC 0.0 0.0 - 0.2 %    Comment: Performed at [facility], [address] [place], [place] [id number]  Basic metabolic panel     Status: Abnormal   Collection Time: 12/31/19   5:43 AM  Result Value Ref Range  Sodium 140 135 - 145 mmol/L   Potassium 3.6 3.5 - 5.1 mmol/L   Chloride 107 98 - 111 mmol/L   CO2 28 22 - 32 mmol/L   Glucose, Bld 86 70 - 99 mg/dL   BUN 8 8 - 23 mg/dL   Creatinine, Ser 0.67 0.44 - 1.00 mg/dL   Calcium 8.3 (L) 8.9 - 10.3 mg/dL   GFR calc non Af Amer >60 >60 mL/min   GFR calc Af Amer >60 >60 mL/min   Anion gap 5 5 - 15    Comment: Performed at [facility], [address] [place], [place] [address]      PHQ2/9: Depression screen [facility] 2/9 01/12/2020 10/15/2019 06/14/2019 03/11/2019 02/08/2019  Decreased Interest 0 0 0 0 0  Down, Depressed, Hopeless 0 0 0 0 0  PHQ - 2 Score 0 0 0 0 0  Altered sleeping 0 0 0 0 0  Tired, decreased energy 0 0 0 0 0  Change in appetite 0 0 0 0 0  Feeling bad or failure about yourself  0 0 0 0 0  Trouble concentrating 0 0 0 0 0  Moving slowly or fidgety/restless 0 0 0 0 0  Suicidal thoughts 0 0 0 0 0  PHQ-9 Score 0 0 0 0 0  Difficult doing work/chores Not difficult at all Not difficult at all Not difficult at all Not difficult at all Not difficult at all  Some recent data might be hidden    phq 9 is negative   Fall Risk: Fall Risk  01/12/2020 10/15/2019 06/14/2019 03/11/2019 02/08/2019  Falls in the past year? 0 0 0 0 0  Number falls in past yr: 0 0 0 0 0  Injury with Fall? 0 0 0 0 0  Follow up - Falls evaluation completed - - -     Functional Status Survey: Is the patient deaf or have difficulty hearing?: No Does the patient have difficulty seeing, even when wearing glasses/contacts?: No Does the patient have difficulty concentrating, remembering, or making decisions?: No Does the patient have difficulty walking or climbing stairs?: No Does the patient have difficulty dressing or bathing?: No Does the patient have difficulty doing errands alone such as visiting a doctor's office or shopping?: No    Assessment & Plan  1. Hospital discharge follow-up  Reviewed records with  patient   2. Lower GI bleeding  Still having some pain, but no bleeding, we will try to reach out to Dr. Irwin Brakeman  3. History of diverticulosis   4. Anemia due to acute blood loss  - CBC with Differential/Platelet - ferrous sulfate 325 (65 FE) MG EC tablet; Take 1 tablet (325 mg total) by mouth daily with breakfast.  Dispense: 90 tablet; Refill: 0 - Iron, TIBC and Ferritin Panel

## 2020-01-13 LAB — CBC WITH DIFFERENTIAL/PLATELET
Absolute Monocytes: 340 cells/uL (ref 200–950)
Basophils Absolute: 20 cells/uL (ref 0–200)
Basophils Relative: 0.5 %
Eosinophils Absolute: 232 cells/uL (ref 15–500)
Eosinophils Relative: 5.8 %
HCT: 33.1 % — ABNORMAL LOW (ref 35.0–45.0)
Hemoglobin: 11.2 g/dL — ABNORMAL LOW (ref 11.7–15.5)
Lymphs Abs: 1432 cells/uL (ref 850–3900)
MCH: 30 pg (ref 27.0–33.0)
MCHC: 33.8 g/dL (ref 32.0–36.0)
MCV: 88.7 fL (ref 80.0–100.0)
MPV: 9.1 fL (ref 7.5–12.5)
Monocytes Relative: 8.5 %
Neutro Abs: 1976 cells/uL (ref 1500–7800)
Neutrophils Relative %: 49.4 %
Platelets: 252 10*3/uL (ref 140–400)
RBC: 3.73 10*6/uL — ABNORMAL LOW (ref 3.80–5.10)
RDW: 12.6 % (ref 11.0–15.0)
Total Lymphocyte: 35.8 %
WBC: 4 10*3/uL (ref 3.8–10.8)

## 2020-01-13 LAB — IRON,TIBC AND FERRITIN PANEL
%SAT: 11 % (calc) — ABNORMAL LOW (ref 16–45)
Ferritin: 32 ng/mL (ref 16–288)
Iron: 40 ug/dL — ABNORMAL LOW (ref 45–160)
TIBC: 351 mcg/dL (calc) (ref 250–450)

## 2020-01-17 ENCOUNTER — Telehealth: Payer: Self-pay

## 2020-01-17 NOTE — Telephone Encounter (Signed)
Made patient a appointment on 01/18/2020

## 2020-01-17 NOTE — Telephone Encounter (Signed)
-----   Message from Virgel Manifold, MD sent at 01/17/2020  3:08 PM EST ----- Thank you for bringing it to my attention Dr. Ancil Boozer. Quavis Klutz/Tati, can we please switch her appt from Dr. Vicente Males to me. Virtual visit with me this week or next. thanks ----- Message ----- From: Steele Sizer, MD Sent: 01/12/2020   3:21 PM EST To: Virgel Manifold, MD  Patient was admitted for lower GI bleed, she is doing better but still has some lower abdominal pain. She was supposed to follow up with you in 2 weeks but her appointment is not until mid March Thank you Drue Stager

## 2020-01-18 ENCOUNTER — Ambulatory Visit (INDEPENDENT_AMBULATORY_CARE_PROVIDER_SITE_OTHER): Payer: Medicare Other | Admitting: Gastroenterology

## 2020-01-18 ENCOUNTER — Encounter: Payer: Self-pay | Admitting: Gastroenterology

## 2020-01-18 ENCOUNTER — Other Ambulatory Visit: Payer: Self-pay

## 2020-01-18 DIAGNOSIS — Z8719 Personal history of other diseases of the digestive system: Secondary | ICD-10-CM | POA: Diagnosis not present

## 2020-01-18 DIAGNOSIS — R109 Unspecified abdominal pain: Secondary | ICD-10-CM | POA: Diagnosis not present

## 2020-01-18 MED ORDER — POLYETHYLENE GLYCOL 3350 17 G PO PACK: 17.0000 g | PACK | Freq: Every day | ORAL | 2 refills | Status: AC

## 2020-01-18 NOTE — Progress Notes (Signed)
Mir

## 2020-01-18 NOTE — Progress Notes (Signed)
[name], MD [address]  [place]  [place], [place] [address]  Main: [phone]  Fax: [phone]   Primary Care Physician: No primary care provider on file.  Virtual Visit via Video Note  I connected with patient on 01/18/20 at  1:15 PM EST by video (using [email or web address]) and verified that I am speaking with the correct person using two identifiers.   I discussed the limitations, risks, security and privacy concerns of performing an evaluation and management service by video and the availability of in person appointments. I also discussed with the patient that there may be a patient responsible charge related to this service. The patient expressed understanding and agreed to proceed.  Location of Patient: Home Location of Provider: Home Persons involved: Patient and provider only (Nursing staff checked in patient via phone but were not physically involved in the video interaction - see their notes)   History of Present Illness: Chief Complaint  Patient presents with  . Follow-up    Patient is having abdominal pain in the epigastic area. Patient denies any nausea. Patient has had some loose stools     HPI: Holly Green is a 74 y.o. female here for hospital follow-up.  Patient recently admitted for lower GI bleed, presumed to be diverticular in nature given her clinical symptoms and previous history of recurrent diverticular bleeding.  Patient was seen by Dr. Alice Reichert as an inpatient, and due to her recent endoscopic procedures, and resolution of bleeding, colonoscopy was not done.  Her bleeding has since resolved.  However, she is complaining of new lower abdominal pain that started in the hospital and has been continuing since then.  It is not severe.  It is dull, 5 or 10, nonradiating.  Has not had any recent abdominal imaging.  No nausea or vomiting.  No weight loss.  Is taking MiraLAX and reports bowel movements every day or every other day without straining.  States  previously when she had abdominal pain it would come on before a bowel movement and resolved after a bowel movement.  However, states now the abdominal pain occurs even when she does not have to have a bowel movement.  Current Outpatient Medications  Medication Sig Dispense Refill  . acetaminophen (TYLENOL) 650 MG CR tablet Take 650-1,300 mg by mouth every 8 (eight) hours as needed for pain.    Marland Kitchen alendronate (FOSAMAX) 70 MG tablet TAKE 1 TABLET BY MOUTH ONCE A WEEK (EVERY  7  DAYS).  TAKE  WITH  A  FULL  GLASS  OF  WATER  ON  EMPTY  STOMACH (Patient taking differently: Take 70 mg by mouth every Sunday. ) 4 tablet 0  . bisoprolol-hydrochlorothiazide (ZIAC) 10-6.25 MG tablet Take 1 tablet by mouth daily. 90 tablet 1  . Calcium Carb-Cholecalciferol (CALCIUM-VITAMIN D) 600-400 MG-UNIT TABS Take 1 tablet by mouth daily.    Marland Kitchen dicyclomine (BENTYL) 10 MG capsule Take 1 capsule (10 mg total) by mouth 4 (four) times daily as needed for up to 14 days for spasms. 56 capsule 0  . ferrous sulfate 325 (65 FE) MG EC tablet Take 1 tablet (325 mg total) by mouth daily with breakfast. 90 tablet 0  . loratadine (CLARITIN) 10 MG tablet Take 1 tablet (10 mg total) by mouth daily. 30 tablet 11  . losartan (COZAAR) 25 MG tablet Take 1 tablet by mouth once daily (Patient taking differently: Take 25 mg by mouth daily. ) 90 tablet 0  . lovastatin (MEVACOR) 40  MG tablet Take 1 tablet (40 mg total) by mouth at bedtime. 90 tablet 3  . polyethylene glycol (MIRALAX / GLYCOLAX) packet Take 17 g by mouth daily.    . polyethylene glycol (MIRALAX) 17 g packet Take 17 g by mouth daily. 30 each 2   No current facility-administered medications for this visit.    Allergies as of 01/18/2020 - Review Complete 01/18/2020  Allergen Reaction Noted  . Aspirin Other (See Comments) 08/08/2017  . Lisinopril Cough 06/14/2019    Review of Systems:    All systems reviewed and negative except where noted in HPI.    Observations/Objective:  Labs: CMP     Component Value Date/Time   NA 140 12/31/2019 0543   NA 141 10/15/2019 1205   NA 141 01/16/2014 0514   K 3.6 12/31/2019 0543   K 3.5 01/16/2014 0514   CL 107 12/31/2019 0543   CL 108 (H) 01/16/2014 0514   CO2 28 12/31/2019 0543   CO2 28 01/16/2014 0514   GLUCOSE 86 12/31/2019 0543   GLUCOSE 104 (H) 01/16/2014 0514   BUN 8 12/31/2019 0543   BUN 15 10/15/2019 1205   BUN 2 (L) 01/16/2014 0514   CREATININE 0.67 12/31/2019 0543   CREATININE 0.65 02/08/2019 1234   CALCIUM 8.3 (L) 12/31/2019 0543   CALCIUM 7.6 (L) 01/16/2014 0514   PROT 6.9 12/29/2019 1018   PROT 6.7 10/15/2019 1205   PROT 6.2 (L) 01/11/2014 1121   ALBUMIN 3.6 12/29/2019 1018   ALBUMIN 4.3 10/15/2019 1205   ALBUMIN 3.0 (L) 01/11/2014 1121   AST 30 12/29/2019 1018   AST 24 01/11/2014 1121   ALT 20 12/29/2019 1018   ALT 16 01/11/2014 1121   ALKPHOS 77 12/29/2019 1018   ALKPHOS 69 01/11/2014 1121   BILITOT 0.9 12/29/2019 1018   BILITOT 1.4 (H) 10/15/2019 1205   BILITOT 0.4 01/11/2014 1121   GFRNONAA >60 12/31/2019 0543   GFRNONAA 89 02/08/2019 1234   GFRAA >60 12/31/2019 0543   GFRAA 104 02/08/2019 1234   Lab Results  Component Value Date   WBC 4.0 01/12/2020   HGB 11.2 (L) 01/12/2020   HCT 33.1 (L) 01/12/2020   MCV 88.7 01/12/2020   PLT 252 01/12/2020    Imaging Studies: No results found.  Assessment and Plan:   Holly Green is a 74 y.o. y/o female with history of recurrent diverticular bleeding, recently admitted for the same, and managed conservatively, being seen for hospital follow-up and reporting lower abdominal pain  Assessment and Plan: We will obtain CT abdomen pelvis for evaluation of lower abdominal pain rule out diverticulitis  High-fiber diet MiraLAX daily with goal of 1-2 soft bowel movements daily.  If not at goal, patient instructed to increase dose to twice daily.  If loose stools with the medication, patient asked to decrease the  medication to every other day, or half dose daily.  Patient verbalized understanding   Follow Up Instructions:    I discussed the assessment and treatment plan with the patient. The patient was provided an opportunity to ask questions and all were answered. The patient agreed with the plan and demonstrated an understanding of the instructions.   The patient was advised to call back or seek an in-person evaluation if the symptoms worsen or if the condition fails to improve as anticipated.  I provided 15 minutes of face-to-face time via video software during this encounter. Additional time was spent in reviewing patient's chart, placing orders etc.   Tanishka Drolet B  Bonna Gains, MD  Speech recognition software was used to dictate this note.

## 2020-01-18 NOTE — Patient Instructions (Signed)

## 2020-01-28 ENCOUNTER — Ambulatory Visit
Admission: RE | Admit: 2020-01-28 | Discharge: 2020-01-28 | Disposition: A | Discharge status: Home / Self Care | Payer: Medicare Other | Source: Ambulatory Visit | Attending: Gastroenterology | Admitting: Gastroenterology

## 2020-01-28 ENCOUNTER — Other Ambulatory Visit: Payer: Self-pay

## 2020-01-28 DIAGNOSIS — R109 Unspecified abdominal pain: Secondary | ICD-10-CM | POA: Diagnosis not present

## 2020-01-28 DIAGNOSIS — Z8719 Personal history of other diseases of the digestive system: Secondary | ICD-10-CM | POA: Diagnosis present

## 2020-01-28 MED ORDER — IOHEXOL 300 MG/ML  SOLN
100.0000 mL | Freq: Once | INTRAMUSCULAR | Status: AC | PRN
  Administered 2020-01-28: 100 mL via INTRAVENOUS

## 2020-02-01 ENCOUNTER — Telehealth: Payer: Self-pay

## 2020-02-01 DIAGNOSIS — R933 Abnormal findings on diagnostic imaging of other parts of digestive tract: Secondary | ICD-10-CM

## 2020-02-01 NOTE — Telephone Encounter (Signed)
-----   Message from Virgel Manifold, MD sent at 02/01/2020  1:33 PM EST ----- Caryl Pina please let the patient know, CT shows thickening of the stomach. An EGD would help evaluate this area for inflammation or ulcers. Schedule with Dr. Allen Norris or me. Avoid NSAID use such as Ibuprofen, Aleeve, advil, motrin, BC and Goodie powder, Naproxen, Meloxicam and others.

## 2020-02-01 NOTE — Telephone Encounter (Signed)
Called and left a message for call back  

## 2020-02-02 ENCOUNTER — Other Ambulatory Visit: Payer: Self-pay

## 2020-02-02 DIAGNOSIS — R933 Abnormal findings on diagnostic imaging of other parts of digestive tract: Secondary | ICD-10-CM

## 2020-02-02 NOTE — Telephone Encounter (Signed)
Patient called back and verbalized understanding. Scheduled patient EGD with Dr. Allen Norris on 02/22/2020. Went over instructions with patient and sent them to [facility] and mailed

## 2020-02-04 ENCOUNTER — Ambulatory Visit (INDEPENDENT_AMBULATORY_CARE_PROVIDER_SITE_OTHER): Payer: Medicare Other

## 2020-02-04 DIAGNOSIS — Z0000 Encounter for general adult medical examination without abnormal findings: Secondary | ICD-10-CM

## 2020-02-04 NOTE — Patient Instructions (Signed)
Holly Green , Thank you for taking time to come for your Medicare Wellness Visit. I appreciate your ongoing commitment to your health goals. Please review the following plan we discussed and let me know if I can assist you in the future.   Screening recommendations/referrals: Colonoscopy: done 11/09/17. Repeat in 2023. Mammogram: done 07/02/19 Bone Density: done 08/19/18 Recommended yearly ophthalmology/optometry visit for glaucoma screening and checkup Recommended yearly dental visit for hygiene and checkup  Vaccinations: Influenza vaccine: done 10/15/19 Pneumococcal vaccine: done 08/06/18 Tdap vaccine: done 06/15/12 Shingles vaccine: Shingrix discussed. Please contact your pharmacy for coverage information.    Advanced directives: Please bring a copy of your health care power of attorney and living will to the office at your convenience.  Conditions/risks identified: Recommend increasing physical activity as tolerated  Next appointment: Please follow up in one year for your Medicare Annual Wellness visit.     Preventive Care 73 Years and Older, Female Preventive care refers to lifestyle choices and visits with your health care provider that can promote health and wellness. What does preventive care include?  A yearly physical exam. This is also called an annual well check.  Dental exams once or twice a year.  Routine eye exams. Ask your health care provider how often you should have your eyes checked.  Personal lifestyle choices, including:  Daily care of your teeth and gums.  Regular physical activity.  Eating a healthy diet.  Avoiding tobacco and drug use.  Limiting alcohol use.  Practicing safe sex.  Taking low-dose aspirin every day.  Taking vitamin and mineral supplements as recommended by your health care provider. What happens during an annual well check? The services and screenings done by your health care provider during your annual well check will depend on your  age, overall health, lifestyle risk factors, and family history of disease. Counseling  Your health care provider may ask you questions about your:  Alcohol use.  Tobacco use.  Drug use.  Emotional well-being.  Home and relationship well-being.  Sexual activity.  Eating habits.  History of falls.  Memory and ability to understand (cognition).  Work and work Statistician.  Reproductive health. Screening  You may have the following tests or measurements:  Height, weight, and BMI.  Blood pressure.  Lipid and cholesterol levels. These may be checked every 5 years, or more frequently if you are over 73 years old.  Skin check.  Lung cancer screening. You may have this screening every year starting at age 73 if you have a 30-pack-year history of smoking and currently smoke or have quit within the past 15 years.  Fecal occult blood test (FOBT) of the stool. You may have this test every year starting at age 73.  Flexible sigmoidoscopy or colonoscopy. You may have a sigmoidoscopy every 5 years or a colonoscopy every 10 years starting at age 73.  Hepatitis C blood test.  Hepatitis B blood test.  Sexually transmitted disease (STD) testing.  Diabetes screening. This is done by checking your blood sugar (glucose) after you have not eaten for a while (fasting). You may have this done every 1-3 years.  Bone density scan. This is done to screen for osteoporosis. You may have this done starting at age 73.  Mammogram. This may be done every 1-2 years. Talk to your health care provider about how often you should have regular mammograms. Talk with your health care provider about your test results, treatment options, and if necessary, the need for more tests. Vaccines  Your health care provider may recommend certain vaccines, such as:  Influenza vaccine. This is recommended every year.  Tetanus, diphtheria, and acellular pertussis (Tdap, Td) vaccine. You may need a Td booster  every 10 years.  Zoster vaccine. You may need this after age 73.  Pneumococcal 13-valent conjugate (PCV13) vaccine. One dose is recommended after age 73.  Pneumococcal polysaccharide (PPSV23) vaccine. One dose is recommended after age 73. Talk to your health care provider about which screenings and vaccines you need and how often you need them. This information is not intended to replace advice given to you by your health care provider. Make sure you discuss any questions you have with your health care provider. Document Released: 12/08/2015 Document Revised: 07/31/2016 Document Reviewed: 09/12/2015 Elsevier Interactive Patient Education  2017 [employer] Prevention in the Home Falls can cause injuries. They can happen to people of all ages. There are many things you can do to make your home safe and to help prevent falls. What can I do on the outside of my home?  Regularly fix the edges of walkways and driveways and fix any cracks.  Remove anything that might make you trip as you walk through a door, such as a raised step or threshold.  Trim any bushes or trees on the path to your home.  Use bright outdoor lighting.  Clear any walking paths of anything that might make someone trip, such as rocks or tools.  Regularly check to see if handrails are loose or broken. Make sure that both sides of any steps have handrails.  Any raised decks and porches should have guardrails on the edges.  Have any leaves, snow, or ice cleared regularly.  Use sand or salt on walking paths during winter.  Clean up any spills in your garage right away. This includes oil or grease spills. What can I do in the bathroom?  Use night lights.  Install grab bars by the toilet and in the tub and shower. Do not use towel bars as grab bars.  Use non-skid mats or decals in the tub or shower.  If you need to sit down in the shower, use a plastic, non-slip stool.  Keep the floor dry. Clean up any  water that spills on the floor as soon as it happens.  Remove soap buildup in the tub or shower regularly.  Attach bath mats securely with double-sided non-slip rug tape.  Do not have throw rugs and other things on the floor that can make you trip. What can I do in the bedroom?  Use night lights.  Make sure that you have a light by your bed that is easy to reach.  Do not use any sheets or blankets that are too big for your bed. They should not hang down onto the floor.  Have a firm chair that has side arms. You can use this for support while you get dressed.  Do not have throw rugs and other things on the floor that can make you trip. What can I do in the kitchen?  Clean up any spills right away.  Avoid walking on wet floors.  Keep items that you use a lot in easy-to-reach places.  If you need to reach something above you, use a strong step stool that has a grab bar.  Keep electrical cords out of the way.  Do not use floor polish or wax that makes floors slippery. If you must use wax, use non-skid floor wax.  Do  not have throw rugs and other things on the floor that can make you trip. What can I do with my stairs?  Do not leave any items on the stairs.  Make sure that there are handrails on both sides of the stairs and use them. Fix handrails that are broken or loose. Make sure that handrails are as long as the stairways.  Check any carpeting to make sure that it is firmly attached to the stairs. Fix any carpet that is loose or worn.  Avoid having throw rugs at the top or bottom of the stairs. If you do have throw rugs, attach them to the floor with carpet tape.  Make sure that you have a light switch at the top of the stairs and the bottom of the stairs. If you do not have them, ask someone to add them for you. What else can I do to help prevent falls?  Wear shoes that:  Do not have high heels.  Have rubber bottoms.  Are comfortable and fit you well.  Are closed  at the toe. Do not wear sandals.  If you use a stepladder:  Make sure that it is fully opened. Do not climb a closed stepladder.  Make sure that both sides of the stepladder are locked into place.  Ask someone to hold it for you, if possible.  Clearly mark and make sure that you can see:  Any grab bars or handrails.  First and last steps.  Where the edge of each step is.  Use tools that help you move around (mobility aids) if they are needed. These include:  Canes.  Walkers.  Scooters.  Crutches.  Turn on the lights when you go into a dark area. Replace any light bulbs as soon as they burn out.  Set up your furniture so you have a clear path. Avoid moving your furniture around.  If any of your floors are uneven, fix them.  If there are any pets around you, be aware of where they are.  Review your medicines with your doctor. Some medicines can make you feel dizzy. This can increase your chance of falling. Ask your doctor what other things that you can do to help prevent falls. This information is not intended to replace advice given to you by your health care provider. Make sure you discuss any questions you have with your health care provider. Document Released: 09/07/2009 Document Revised: 04/18/2016 Document Reviewed: 12/16/2014 Elsevier Interactive Patient Education  2017 [employer].

## 2020-02-04 NOTE — Progress Notes (Signed)
Subjective:   Holly Green is a 74 y.o. female who presents for Medicare Annual (Subsequent) preventive examination.  Virtual Visit via Telephone Note  I connected with Holly Green on 02/04/20 at 10:00 AM EST by telephone and verified that I am speaking with the correct person using two identifiers.  Medicare Annual Wellness visit completed telephonically due to Covid-19 pandemic.   Location: Patient: home Provider: office   I discussed the limitations, risks, security and privacy concerns of performing an evaluation and management service by telephone and the availability of in person appointments. The patient expressed understanding and agreed to proceed.  Some vital signs may be absent or patient reported.   Clemetine Marker, LPN     Review of Systems:   Cardiac Risk Factors include: advanced age (>62men, >60 women);dyslipidemia;hypertension;obesity (BMI >30kg/m2)     Objective:     Vitals: There were no vitals taken for this visit.  There is no height or weight on file to calculate BMI.  Advanced Directives 02/04/2020 12/29/2019 12/29/2019 11/06/2017 08/28/2017 08/15/2017 08/12/2017  Does Patient Have a Medical Advance Directive? Yes Yes No No No No No  Type of Paramedic of [place];Living will [place]  Does patient want to make changes to medical advance directive? - No - Guardian declined - - - - -  Copy of [place] in Chart? No - copy requested Yes - validated most recent copy scanned in chart (See row information) - - - - -  Would patient like information on creating a medical advance directive? - No - Patient declined No - Patient declined No - Patient declined - No - Patient declined Yes (MAU/Ambulatory/Procedural Areas - Information given)    Tobacco Social History   Tobacco Use  Smoking Status Former Smoker  Smokeless Tobacco Never Used  Tobacco Comment   teenage years about 1 year       Counseling given: Not Answered Comment: teenage years about 1 year   Clinical Intake:  Pre-visit preparation completed: Yes  Pain : 0-10 Pain Score: 4  Pain Type: Acute pain Pain Location: Abdomen Pain Orientation: Mid Pain Descriptors / Indicators: Discomfort, Cramping Pain Onset: 1 to 4 weeks ago Pain Frequency: Constant     Nutritional Risks: Nausea/ vomitting/ diarrhea Diabetes: No  How often do you need to have someone help you when you read instructions, pamphlets, or other written materials from your doctor or pharmacy?: 1 - Never  Interpreter Needed?: No  Information entered by :: Clemetine Marker LPN  Past Medical History:  Diagnosis Date  . Abdominal aortic atherosclerosis ([place]) 03/05/2017   CT scan April 2018  . Arthritis   . Bleeding disorder ([place])   . Diverticulitis 2015  . GERD (gastroesophageal reflux disease)   . History of kidney stones   . History of stomach ulcers   . Hyperlipidemia   . Hypertension   . Osteoporosis   . Pre-diabetes   . Prediabetes 10/15/2015  . Spondylolisthesis at L4-L5 level 03/07/2017   Past Surgical History:  Procedure Laterality Date  . ABDOMINAL HYSTERECTOMY    . BACK SURGERY  2010  . BREAST CYST ASPIRATION Right 1990's  . COLONOSCOPY Left 11/09/2017   Procedure: COLONOSCOPY;  Surgeon: Virgel Manifold, MD;  Location: [facility] ENDOSCOPY;  Service: Endoscopy;  Laterality: Left;  . CYSTO WITH HYDRODISTENSION N/A 08/15/2017   Procedure: CYSTOSCOPY/HYDRODISTENSION;  Surgeon: Nickie Retort, MD;  Location: [facility] ORS;  Service:  Urology;  Laterality: N/A;  . ESOPHAGOGASTRODUODENOSCOPY Left 11/09/2017   Procedure: ESOPHAGOGASTRODUODENOSCOPY (EGD);  Surgeon: Virgel Manifold, MD;  Location: [facility] ENDOSCOPY;  Service: Endoscopy;  Laterality: Left;  . FOOT SURGERY Right    Family History  Problem Relation Age of Onset  . Hypertension Brother   . Heart disease Brother   . Heart failure Brother   . Hypertension Mother   .  Stroke Mother   . Stroke Father   . Prostate cancer Father   . Aneurysm Sister   . Breast cancer Sister 15  . Liver disease Maternal Aunt   . Heart failure Paternal Aunt   . Heart disease Paternal Uncle   . Heart attack Brother   . Heart disease Brother   . Breast cancer Maternal Aunt   . Heart disease Paternal Aunt   . Kidney cancer Neg Hx   . Bladder Cancer Neg Hx    Social History   Socioeconomic History  . Marital status: Single    Spouse name: Not on file  . Number of children: 1  . Years of education: Not on file  . Highest education level: High school graduate  Occupational History  . Occupation: retired  Tobacco Use  . Smoking status: Former Research scientist (life sciences)  . Smokeless tobacco: Never Used  . Tobacco comment: teenage years about 1 year  Substance and Sexual Activity  . Alcohol use: No    Alcohol/week: 0.0 standard drinks  . Drug use: No  . Sexual activity: Never  Other Topics Concern  . Not on file  Social History Narrative  . Not on file   Social Determinants of Health   Financial Resource Strain: Low Risk   . Difficulty of Paying Living Expenses: Not hard at all  Food Insecurity: No Food Insecurity  . Worried About Charity fundraiser in the Last Year: Never true  . Ran Out of Food in the Last Year: Never true  Transportation Needs: No Transportation Needs  . Lack of Transportation (Medical): No  . Lack of Transportation (Non-Medical): No  Physical Activity: Inactive  . Days of Exercise per Week: 0 days  . Minutes of Exercise per Session: 0 min  Stress: No Stress Concern Present  . Feeling of Stress : Not at all  Social Connections: Slightly Isolated  . Frequency of Communication with Friends and Family: More than three times a week  . Frequency of Social Gatherings with Friends and Family: Once a week  . Attends Religious Services: More than 4 times per year  . Active Member of Clubs or Organizations: Yes  . Attends Archivist Meetings: Never  .  Marital Status: Never married    Outpatient Encounter Medications as of 02/04/2020  Medication Sig  . acetaminophen (TYLENOL) 650 MG CR tablet Take 650-1,300 mg by mouth every 8 (eight) hours as needed for pain.  Marland Kitchen alendronate (FOSAMAX) 70 MG tablet TAKE 1 TABLET BY MOUTH ONCE A WEEK (EVERY  7  DAYS).  TAKE  WITH  A  FULL  GLASS  OF  WATER  ON  EMPTY  STOMACH (Patient taking differently: Take 70 mg by mouth every Sunday. )  . bisoprolol-hydrochlorothiazide (ZIAC) 10-6.25 MG tablet Take 1 tablet by mouth daily.  . Calcium Carb-Cholecalciferol (CALCIUM-VITAMIN D) 600-400 MG-UNIT TABS Take 1 tablet by mouth daily.  . ferrous sulfate 325 (65 FE) MG EC tablet Take 1 tablet (325 mg total) by mouth daily with breakfast.  . loratadine (CLARITIN) 10 MG tablet Take 1  tablet (10 mg total) by mouth daily.  Marland Kitchen losartan (COZAAR) 25 MG tablet Take 1 tablet by mouth once daily (Patient taking differently: Take 25 mg by mouth daily. )  . lovastatin (MEVACOR) 40 MG tablet Take 1 tablet (40 mg total) by mouth at bedtime.  . polyethylene glycol (MIRALAX) 17 g packet Take 17 g by mouth daily.  Marland Kitchen dicyclomine (BENTYL) 10 MG capsule Take 1 capsule (10 mg total) by mouth 4 (four) times daily as needed for up to 14 days for spasms.  . [DISCONTINUED] polyethylene glycol (MIRALAX / GLYCOLAX) packet Take 17 g by mouth daily.   No facility-administered encounter medications on file as of 02/04/2020.    Activities of Daily Living In your present state of health, do you have any difficulty performing the following activities: 02/04/2020 01/12/2020  Hearing? N N  Comment declines hearing aids -  Vision? N N  Difficulty concentrating or making decisions? N N  Walking or climbing stairs? N N  Dressing or bathing? N N  Doing errands, shopping? N N  Preparing Food and eating ? N -  Using the Toilet? N -  In the past six months, have you accidently leaked urine? N -  Do you have problems with loss of bowel control? N -    Managing your Medications? N -  Managing your Finances? N -  Housekeeping or managing your Housekeeping? N -  Some recent data might be hidden    Patient Care Team: Hubbard Hartshorn, FNP as PCP - General (Family Medicine) Marlowe Sax, MD as Referring Physician (Rheumatology) Hubbard Hartshorn, FNP as Nurse Practitioner (Family Medicine) Virgel Manifold, MD as Consulting Physician (Gastroenterology)    Assessment:   This is a routine wellness examination for [place].  Exercise Activities and Dietary recommendations Current Exercise Habits: The patient does not participate in regular exercise at present, Exercise limited by: None identified  Goals    . Increase physical activity     Recommend increasing physical activity to at least 3 days per week as tolerated.        Fall Risk Fall Risk  02/04/2020 01/12/2020 10/15/2019 06/14/2019 03/11/2019  Falls in the past year? 1 0 0 0 0  Number falls in past yr: 1 0 0 0 0  Comment mechanical fall - - - -  Injury with Fall? 0 0 0 0 0  Risk for fall due to : No Fall Risks - - - -  Follow up Falls prevention discussed - Falls evaluation completed - -   FALL RISK PREVENTION PERTAINING TO THE HOME:  Any stairs in or around the home? Yes  If so, do they handrails? Yes   Home free of loose throw rugs in walkways, pet beds, electrical cords, etc? Yes  Adequate lighting in your home to reduce risk of falls? Yes   ASSISTIVE DEVICES UTILIZED TO PREVENT FALLS:  Life alert? No  Use of a cane, walker or w/c? No  Grab bars in the bathroom? No  Shower chair or bench in shower? No  Elevated toilet seat or a handicapped toilet? Yes   DME ORDERS:  DME order needed?  No   TIMED UP AND GO:  Was the test performed? No . Telephonic visit.   Education: Fall risk prevention has been discussed.  Intervention(s) required? No   Depression Screen PHQ 2/9 Scores 02/04/2020 01/12/2020 10/15/2019 06/14/2019  PHQ - 2 Score 0 0 0 0  PHQ- 9  Score - 0 0 0  Cognitive Function     6CIT Screen 02/04/2020  What Year? 0 points  What month? 0 points  What time? 0 points  Count back from 20 0 points  Months in reverse 0 points  Repeat phrase 0 points  Total Score 0    Immunization History  Administered Date(s) Administered  . Fluad Quad(high Dose 65+) 10/15/2019  . Influenza, High Dose Seasonal PF 08/06/2018  . Pneumococcal Conjugate-13 08/06/2018  . Tdap 06/15/2012    Qualifies for Shingles Vaccine? Yes . Due for Shingrix. Education has been provided regarding the importance of this vaccine. Pt has been advised to call insurance company to determine out of pocket expense. Advised may also receive vaccine at local pharmacy or Health Dept. Verbalized acceptance and understanding.  Tdap: Up to date  Flu Vaccine: Up to date  Pneumococcal Vaccine: Up to date   Screening Tests Health Maintenance  Topic Date Due  . MAMMOGRAM  07/01/2020  . TETANUS/TDAP  06/15/2022  . COLONOSCOPY  11/09/2022  . INFLUENZA VACCINE  Completed  . DEXA SCAN  Completed  . Hepatitis C Screening  Completed  . PNA vac Low Risk Adult  Completed    Cancer Screenings:  Colorectal Screening: Completed 11/09/17. Repeat every 5 years;   Mammogram: Completed 07/02/19. Repeat every year  Bone Density: Completed 08/19/18. Results reflect OSTEOPOROSIS. Repeat every 2 years.   Lung Cancer Screening: (Low Dose CT Chest recommended if Age 19-80 years, 30 pack-year currently smoking OR have quit w/in 15years.) does not qualify.   Additional Screening:  Hepatitis C Screening: does qualify; Completed 02/20/17  Vision Screening: Recommended annual ophthalmology exams for early detection of glaucoma and other disorders of the eye. Is the patient up to date with their annual eye exam?  Yes  Who is the provider or what is the name of the office in which the pt attends annual eye exams? [facility]  Dental Screening: Recommended annual dental exams for  proper oral hygiene  Community Resource Referral:  CRR required this visit?  No      Plan:      I have personally reviewed and addressed the Medicare Annual Wellness questionnaire and have noted the following in the patient's chart:  A. Medical and social history B. Use of alcohol, tobacco or illicit drugs  C. Current medications and supplements D. Functional ability and status E.  Nutritional status F.  Physical activity G. Advance directives H. List of other physicians I.  Hospitalizations, surgeries, and ER visits in previous 12 months J.  [place] such as hearing and vision if needed, cognitive and depression L. Referrals and appointments   In addition, I have reviewed and discussed with patient certain preventive protocols, quality metrics, and best practice recommendations. A written personalized care plan for preventive services as well as general preventive health recommendations were provided to patient.   Signed,  Clemetine Marker, LPN Nurse Health Advisor   Nurse Notes: pt scheduled for endoscopy 02/22/20. Covid vaccine 1/2 completed, pt to bring card to next OV with dates.

## 2020-02-07 ENCOUNTER — Ambulatory Visit: Payer: Medicare Other | Admitting: Gastroenterology

## 2020-02-11 ENCOUNTER — Ambulatory Visit: Payer: Medicare Other | Admitting: Family Medicine

## 2020-02-15 ENCOUNTER — Other Ambulatory Visit: Payer: Self-pay

## 2020-02-15 ENCOUNTER — Ambulatory Visit (INDEPENDENT_AMBULATORY_CARE_PROVIDER_SITE_OTHER): Payer: Medicare Other | Admitting: Family Medicine

## 2020-02-15 ENCOUNTER — Encounter: Payer: Self-pay | Admitting: Family Medicine

## 2020-02-15 VITALS — BP 180/90 | HR 70 | Temp 97.1°F | Resp 16 | Ht 66.0 in | Wt 193.6 lb

## 2020-02-15 DIAGNOSIS — D62 Acute posthemorrhagic anemia: Secondary | ICD-10-CM

## 2020-02-15 DIAGNOSIS — M1711 Unilateral primary osteoarthritis, right knee: Secondary | ICD-10-CM

## 2020-02-15 DIAGNOSIS — I700 Atherosclerosis of aorta: Secondary | ICD-10-CM

## 2020-02-15 DIAGNOSIS — Z8719 Personal history of other diseases of the digestive system: Secondary | ICD-10-CM | POA: Diagnosis not present

## 2020-02-15 DIAGNOSIS — I10 Essential (primary) hypertension: Secondary | ICD-10-CM

## 2020-02-15 DIAGNOSIS — E782 Mixed hyperlipidemia: Secondary | ICD-10-CM

## 2020-02-15 DIAGNOSIS — R7303 Prediabetes: Secondary | ICD-10-CM

## 2020-02-15 DIAGNOSIS — J449 Chronic obstructive pulmonary disease, unspecified: Secondary | ICD-10-CM

## 2020-02-15 DIAGNOSIS — M810 Age-related osteoporosis without current pathological fracture: Secondary | ICD-10-CM

## 2020-02-15 MED ORDER — LOSARTAN POTASSIUM 50 MG PO TABS: 50.0000 mg | ORAL_TABLET | Freq: Every day | ORAL | 1 refills | Status: DC

## 2020-02-15 MED ORDER — LOVASTATIN 40 MG PO TABS: 40.0000 mg | ORAL_TABLET | Freq: Every day | ORAL | 1 refills | Status: DC

## 2020-02-15 MED ORDER — HYDRALAZINE HCL 10 MG PO TABS: 10.0000 mg | ORAL_TABLET | Freq: Three times a day (TID) | ORAL | 0 refills | Status: DC

## 2020-02-15 NOTE — Progress Notes (Addendum)
Name: Holly Green   MRN: [id number]    DOB: 11-Mar-1946   Date:02/15/2020       Progress Note  Subjective  Chief Complaint  Chief Complaint  Patient presents with  . Abdominal Pain    She continues to have lower abodminal pain. She was hospitalized in Feb. with GI bleed. She is being evaluated by GI. CT scan has been done. Pain is constant daily.  . Hypertension  . Hyperlipidemia  . Prediabetes    HPI   HTN:  BP is high today. She states bp at home has been between 140's-180's/70's-90's Denies headaches, blurred vision, chest pain, shortness of breath, or BLE edema; no lightheadedness or dizziness.  She is taking Losartan 25mg , and Ziac 10-6.25mg .   HLDAortic Atherosclerosis:On higher dose of lovastatin - on 40mg , she denies myalgias No chest pain, shortness of breath. She has aortic atherosclerosis - goal LDL <70.   OA: Seeing Dr. Meda Coffee for OA of right knee and chondrocalcinosis; pt also notes LEFT hip pain.  She is taking Tylenol arthritis PRN.  She notes the cold weather makes things worse.  Last visit to Dr. Meda Coffee was in January 2021, she had cortisone injection on both knees during the visit. She states pain right now is 7/10   Osteoporosis: Taking fossamax and calcium/vitamin D supplement.  Last DEXA was 08/20/2019 - unchanged   Obesity/Prediabetes: Works at [employer] - 10 hour days.  She stays very active at work.  She does not eat at work.  Denies polyphagia, polydipsia, or polyuria.  Hx Anemia/GI bleed/GERD: she had GI bleed in 2018, she had a recurrence 12/29/2019, she still having abdominal pain usually lower abdominal pain, but yesterday it was in the epigastric area. She has follow up with GI for EGD on 02/22/2020. She states stools are dark from taking iron, no changing of stool color.   COPD/AR: Taking claritin PRN; she states she has no issues breathing.  Seeing pulmonology annually for COPD check.  No cough, wheezing or SOB. Not on medications at this  time  Patient Active Problem List   Diagnosis Date Noted  . Gastrointestinal hemorrhage 12/29/2019  . Left hip pain 10/15/2019  . Obesity (BMI 30.0-34.9) 08/10/2018  . Emphysema of lung ([place]) 08/10/2018  . Allergic rhinitis 08/10/2018  . COAD (chronic obstructive airways disease) ([place]) 08/03/2018  . Primary osteoarthritis of right knee 07/17/2018  . Chondrocalcinosis 07/17/2018  . 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 Anemia 12/17/2017  . Diverticulosis of large intestine without diverticulitis   . History of GI bleed 11/06/2017  . Left flank pain, chronic 03/07/2017  . Spondylolisthesis at L4-L5 level 03/07/2017  . Abdominal aortic atherosclerosis ([place]) 03/05/2017  . Simple cyst of kidney 03/05/2017  . Benign liver cyst 03/05/2017  . Medication monitoring encounter 11/22/2016  . GERD (gastroesophageal reflux disease) 08/07/2016  . Osteoporosis 05/07/2016  . Cardiac murmur 05/07/2016  . Prediabetes 10/15/2015  . Hyperlipidemia 10/15/2015  . Essential hypertension 10/15/2015  . Diverticulosis of colon, acquired 10/15/2015    Past Surgical History:  Procedure Laterality Date  . ABDOMINAL HYSTERECTOMY    . BACK SURGERY  2010  . BREAST CYST ASPIRATION Right 1990's  . COLONOSCOPY Left 11/09/2017   Procedure: COLONOSCOPY;  Surgeon: Virgel Manifold, MD;  Location: [facility] ENDOSCOPY;  Service: Endoscopy;  Laterality: Left;  . CYSTO WITH HYDRODISTENSION N/A 08/15/2017   Procedure: CYSTOSCOPY/HYDRODISTENSION;  Surgeon: Nickie Retort, MD;  Location: [facility] ORS;  Service: Urology;  Laterality:  N/A;  . ESOPHAGOGASTRODUODENOSCOPY Left 11/09/2017   Procedure: ESOPHAGOGASTRODUODENOSCOPY (EGD);  Surgeon: Virgel Manifold, MD;  Location: [facility] ENDOSCOPY;  Service: Endoscopy;  Laterality: Left;  . FOOT SURGERY Right     Family History  Problem Relation Age of Onset  . Hypertension Brother   . Heart disease Brother   . Heart failure Brother    . Hypertension Mother   . Stroke Mother   . Stroke Father   . Prostate cancer Father   . Aneurysm Sister   . Breast cancer Sister 21  . Liver disease Maternal Aunt   . Heart failure Paternal Aunt   . Heart disease Paternal Uncle   . Heart attack Brother   . Heart disease Brother   . Breast cancer Maternal Aunt   . Heart disease Paternal Aunt   . Kidney cancer Neg Hx   . Bladder Cancer Neg Hx     Social History   Socioeconomic History  . Marital status: Single    Spouse name: Not on file  . Number of children: 1  . Years of education: Not on file  . Highest education level: High school graduate  Occupational History  . Occupation: retired  Tobacco Use  . Smoking status: Former Research scientist (life sciences)  . Smokeless tobacco: Never Used  . Tobacco comment: teenage years about 1 year  Substance and Sexual Activity  . Alcohol use: No    Alcohol/week: 0.0 standard drinks  . Drug use: No  . Sexual activity: Never  Other Topics Concern  . Not on file  Social History Narrative  . Not on file   Social Determinants of Health   Financial Resource Strain: Low Risk   . Difficulty of Paying Living Expenses: Not hard at all  Food Insecurity: No Food Insecurity  . Worried About Charity fundraiser in the Last Year: Never true  . Ran Out of Food in the Last Year: Never true  Transportation Needs: No Transportation Needs  . Lack of Transportation (Medical): No  . Lack of Transportation (Non-Medical): No  Physical Activity: Inactive  . Days of Exercise per Week: 0 days  . Minutes of Exercise per Session: 0 min  Stress: No Stress Concern Present  . Feeling of Stress : Not at all  Social Connections: Slightly Isolated  . Frequency of Communication with Friends and Family: More than three times a week  . Frequency of Social Gatherings with Friends and Family: Once a week  . Attends Religious Services: More than 4 times per year  . Active Member of Clubs or Organizations: Yes  . Attends Theatre manager Meetings: Never  . Marital Status: Never married    Current Outpatient Medications:  .  acetaminophen (TYLENOL) 650 MG CR tablet, Take 650-1,300 mg by mouth every 8 (eight) hours as needed for pain., Disp: , Rfl:  .  alendronate (FOSAMAX) 70 MG tablet, TAKE 1 TABLET BY MOUTH ONCE A WEEK (EVERY  7  DAYS).  TAKE  WITH  A  FULL  GLASS  OF  WATER  ON  EMPTY  STOMACH (Patient taking differently: Take 70 mg by mouth every Sunday. ), Disp: 4 tablet, Rfl: 0 .  bisoprolol-hydrochlorothiazide (ZIAC) 10-6.25 MG tablet, Take 1 tablet by mouth daily., Disp: 90 tablet, Rfl: 1 .  Calcium Carb-Cholecalciferol (CALCIUM-VITAMIN D) 600-400 MG-UNIT TABS, Take 1 tablet by mouth daily., Disp: , Rfl:  .  ferrous sulfate 325 (65 FE) MG EC tablet, Take 1 tablet (325 mg total) by mouth  daily with breakfast., Disp: 90 tablet, Rfl: 0 .  loratadine (CLARITIN) 10 MG tablet, Take 1 tablet (10 mg total) by mouth daily., Disp: 30 tablet, Rfl: 11 .  losartan (COZAAR) 50 MG tablet, Take 1 tablet (50 mg total) by mouth daily., Disp: 90 tablet, Rfl: 1 .  lovastatin (MEVACOR) 40 MG tablet, Take 1 tablet (40 mg total) by mouth at bedtime., Disp: 90 tablet, Rfl: 1 .  polyethylene glycol (MIRALAX) 17 g packet, Take 17 g by mouth daily., Disp: 30 each, Rfl: 2 .  dicyclomine (BENTYL) 10 MG capsule, Take 1 capsule (10 mg total) by mouth 4 (four) times daily as needed for up to 14 days for spasms., Disp: 56 capsule, Rfl: 0 .  hydrALAZINE (APRESOLINE) 10 MG tablet, Take 1 tablet (10 mg total) by mouth 3 (three) times daily. Prn if bp above 150/90, Disp: 60 tablet, Rfl: 0  Allergies  Allergen Reactions  . Aspirin Other (See Comments)    Diverticulosis  . Lisinopril Cough    I personally reviewed active problem list, medication list, allergies, family history, social history, health maintenance with the patient/caregiver today.   ROS  Constitutional: Negative for fever or weight change.  Respiratory: Negative for cough  and shortness of breath.   Cardiovascular: Negative for chest pain or palpitations.  Gastrointestinal: Positive for abdominal pain, no bowel changes.  Musculoskeletal: Negative for gait problem, positive for knee  joint swelling.  Skin: Negative for rash.  Neurological: Negative for dizziness or headache.  No other specific complaints in a complete review of systems (except as listed in HPI above).  Objective  Vitals:   02/15/20 0912 02/15/20 0918 02/15/20 0954  BP: (!) 180/94 (!) 160/90 (!) 180/90  Pulse: 70    Resp: 16    Temp: (!) 97.1 F (36.2 C)    TempSrc: Temporal    SpO2: 99%    Weight: 193 lb 9.6 oz (87.8 kg)    Height: 5\' 6"  (1.676 m)      Body mass index is 31.25 kg/m.  Physical Exam  Constitutional: Patient appears well-developed and well-nourished. Obese No distress.  HEENT: head atraumatic, normocephalic, pupils equal and reactive to light Cardiovascular: Normal rate, regular rhythm and normal heart sounds.  No murmur heard. No BLE edema. Pulmonary/Chest: Effort normal and breath sounds normal. No respiratory distress. Abdominal: Soft.  There is no tenderness. Normal bowel sounds Psychiatric: Patient has a normal mood and affect. behavior is normal. Judgment and thought content normal.  Recent Results (from the past 2160 hour(s))  Comprehensive metabolic panel     Status: Abnormal   Collection Time: 12/29/19 10:18 AM  Result Value Ref Range   Sodium 142 135 - 145 mmol/L   Potassium 4.2 3.5 - 5.1 mmol/L   Chloride 101 98 - 111 mmol/L   CO2 27 22 - 32 mmol/L   Glucose, Bld 110 (H) 70 - 99 mg/dL   BUN 21 8 - 23 mg/dL   Creatinine, Ser 0.68 0.44 - 1.00 mg/dL   Calcium 9.2 8.9 - 10.3 mg/dL   Total Protein 6.9 6.5 - 8.1 g/dL   Albumin 3.6 3.5 - 5.0 g/dL   AST 30 15 - 41 U/L   ALT 20 0 - 44 U/L   Alkaline Phosphatase 77 38 - 126 U/L   Total Bilirubin 0.9 0.3 - 1.2 mg/dL   GFR calc non Af Amer >60 >60 mL/min   GFR calc Af Amer >60 >60 mL/min   Anion gap 14  5 -  15    Comment: Performed at [facility], [address] [place], [place] [address]  CBC     Status: Abnormal   Collection Time: 12/29/19 10:18 AM  Result Value Ref Range   WBC 7.0 4.0 - 10.5 K/uL   RBC 3.66 (L) 3.87 - 5.11 MIL/uL   Hemoglobin 10.7 (L) 12.0 - 15.0 g/dL   HCT 33.1 (L) 36.0 - 46.0 %   MCV 90.4 80.0 - 100.0 fL   MCH 29.2 26.0 - 34.0 pg   MCHC 32.3 30.0 - 36.0 g/dL   RDW 13.1 11.5 - 15.5 %   Platelets 176 150 - 400 K/uL   nRBC 0.0 0.0 - 0.2 %    Comment: Performed at [facility], [address], [place], [place] [address]  Respiratory Panel by RT PCR (Flu A&B, Covid) - Nasopharyngeal Swab     Status: None   Collection Time: 12/29/19 12:09 PM   Specimen: Nasopharyngeal Swab  Result Value Ref Range   SARS Coronavirus 2 by RT PCR NEGATIVE NEGATIVE    Comment: (NOTE) SARS-CoV-2 target nucleic acids are NOT DETECTED. The SARS-CoV-2 RNA is generally detectable in upper respiratoy specimens during the acute phase of infection. The lowest concentration of SARS-CoV-2 viral copies this assay can detect is 131 copies/mL. A negative result does not preclude SARS-Cov-2 infection and should not be used as the sole basis for treatment or other patient management decisions. A negative result may occur with  improper specimen collection/handling, submission of specimen other than nasopharyngeal swab, presence of viral mutation(s) within the areas targeted by this assay, and inadequate number of viral copies (<131 copies/mL). A negative result must be combined with clinical observations, patient history, and epidemiological information. The expected result is Negative. Fact Sheet for Patients:  PinkCheek.be Fact Sheet for Healthcare Providers:  GravelBags.it This test is not yet ap proved or cleared by the [place] FDA and  has been authorized for detection and/or diagnosis of SARS-CoV-2 by FDA  under an Emergency Use Authorization (EUA). This EUA will remain  in effect (meaning this test can be used) for the duration of the COVID-19 declaration under Section 564(b)(1) of the Act, 21 U.S.C. section 360bbb-3(b)(1), unless the authorization is terminated or revoked sooner.    Influenza A by PCR NEGATIVE NEGATIVE   Influenza B by PCR NEGATIVE NEGATIVE    Comment: (NOTE) The Xpert Xpress SARS-CoV-2/FLU/RSV assay is intended as an aid in  the diagnosis of influenza from Nasopharyngeal swab specimens and  should not be used as a sole basis for treatment. Nasal washings and  aspirates are unacceptable for Xpert Xpress SARS-CoV-2/FLU/RSV  testing. Fact Sheet for Patients: PinkCheek.be Fact Sheet for Healthcare Providers: GravelBags.it This test is not yet approved or cleared by the [place] FDA and  has been authorized for detection and/or diagnosis of SARS-CoV-2 by  FDA under an Emergency Use Authorization (EUA). This EUA will remain  in effect (meaning this test can be used) for the duration of the  Covid-19 declaration under Section 564(b)(1) of the Act, 21  U.S.C. section 360bbb-3(b)(1), unless the authorization is  terminated or revoked. Performed at [facility], [address] [place], [place] [address]   Type and screen     Status: None   Collection Time: 12/29/19  2:50 PM  Result Value Ref Range   ABO/RH(D) B POS    Antibody Screen POS    Sample Expiration 01/01/2020,2359    Antibody Identification NON SPECIFIC ANTIBODY REACTIVITY  Unit Number [id number]    Blood Component Type RED CELLS,LR    Unit division 00    Status of Unit REL FROM [facility]    Transfusion Status OK TO TRANSFUSE    Crossmatch Result COMPATIBLE    Unit Number [id number]    Blood Component Type RED CELLS,LR    Unit division 00    Status of Unit REL FROM [facility]    Transfusion Status OK TO TRANSFUSE    Crossmatch  Result COMPATIBLE   Hemoglobin and hematocrit, blood     Status: Abnormal   Collection Time: 12/29/19  2:50 PM  Result Value Ref Range   Hemoglobin 11.2 (L) 12.0 - 15.0 g/dL   HCT 34.4 (L) 36.0 - 46.0 %    Comment: Performed at [facility], [address] [place], [place] [address]  BPAM RBC     Status: None   Collection Time: 12/29/19  2:50 PM  Result Value Ref Range   Blood Product Unit Number [id number]    PRODUCT CODE [id number]    Unit Type and Rh 7300    Blood Product Expiration Date [id number]    Blood Product Unit Number [id number]    PRODUCT CODE [id number]    Unit Type and Rh 7300    Blood Product Expiration Date [id number]   Hemoglobin and hematocrit, blood     Status: Abnormal   Collection Time: 12/29/19  8:51 PM  Result Value Ref Range   Hemoglobin 11.3 (L) 12.0 - 15.0 g/dL   HCT 34.5 (L) 36.0 - 46.0 %    Comment: Performed at [facility], [address] [place], [place] [address]  Hemoglobin and hematocrit, blood     Status: Abnormal   Collection Time: 12/30/19  3:15 AM  Result Value Ref Range   Hemoglobin 10.8 (L) 12.0 - 15.0 g/dL   HCT 33.3 (L) 36.0 - 46.0 %    Comment: Performed at [facility], [address] [place], [place] [id number]  Basic metabolic panel     Status: Abnormal   Collection Time: 12/30/19  3:15 AM  Result Value Ref Range   Sodium 139 135 - 145 mmol/L   Potassium 3.4 (L) 3.5 - 5.1 mmol/L   Chloride 104 98 - 111 mmol/L   CO2 29 22 - 32 mmol/L   Glucose, Bld 87 70 - 99 mg/dL   BUN 12 8 - 23 mg/dL   Creatinine, Ser 0.65 0.44 - 1.00 mg/dL   Calcium 8.7 (L) 8.9 - 10.3 mg/dL   GFR calc non Af Amer >60 >60 mL/min   GFR calc Af Amer >60 >60 mL/min   Anion gap 6 5 - 15    Comment: Performed at [facility], [address] [place], [place] [address]  Hemoglobin and hematocrit, blood     Status: Abnormal   Collection Time: 12/30/19  9:41 AM  Result Value Ref Range   Hemoglobin 10.4 (L) 12.0 -  15.0 g/dL   HCT 31.5 (L) 36.0 - 46.0 %    Comment: Performed at [facility], [address] [place], [place] [address]  Hemoglobin and hematocrit, blood     Status: Abnormal   Collection Time: 12/30/19  2:47 PM  Result Value Ref Range   Hemoglobin 10.5 (L) 12.0 - 15.0 g/dL   HCT 31.7 (L) 36.0 - 46.0 %    Comment: Performed at [facility], [address], [place], [place] [address]  CBC     Status: Abnormal  Collection Time: 12/31/19  5:43 AM  Result Value Ref Range   WBC 5.4 4.0 - 10.5 K/uL   RBC 3.37 (L) 3.87 - 5.11 MIL/uL   Hemoglobin 10.0 (L) 12.0 - 15.0 g/dL   HCT 30.8 (L) 36.0 - 46.0 %   MCV 91.4 80.0 - 100.0 fL   MCH 29.7 26.0 - 34.0 pg   MCHC 32.5 30.0 - 36.0 g/dL   RDW 13.2 11.5 - 15.5 %   Platelets 180 150 - 400 K/uL   nRBC 0.0 0.0 - 0.2 %    Comment: Performed at [facility], [address] [place], [place] [id number]  Basic metabolic panel     Status: Abnormal   Collection Time: 12/31/19  5:43 AM  Result Value Ref Range   Sodium 140 135 - 145 mmol/L   Potassium 3.6 3.5 - 5.1 mmol/L   Chloride 107 98 - 111 mmol/L   CO2 28 22 - 32 mmol/L   Glucose, Bld 86 70 - 99 mg/dL   BUN 8 8 - 23 mg/dL   Creatinine, Ser 0.67 0.44 - 1.00 mg/dL   Calcium 8.3 (L) 8.9 - 10.3 mg/dL   GFR calc non Af Amer >60 >60 mL/min   GFR calc Af Amer >60 >60 mL/min   Anion gap 5 5 - 15    Comment: Performed at [facility], [address] [place], [place] [address]  CBC with Differential/Platelet     Status: Abnormal   Collection Time: 01/12/20  3:14 PM  Result Value Ref Range   WBC 4.0 3.8 - 10.8 Thousand/uL   RBC 3.73 (L) 3.80 - 5.10 Million/uL   Hemoglobin 11.2 (L) 11.7 - 15.5 g/dL   HCT 33.1 (L) 35.0 - 45.0 %   MCV 88.7 80.0 - 100.0 fL   MCH 30.0 27.0 - 33.0 pg   MCHC 33.8 32.0 - 36.0 g/dL   RDW 12.6 11.0 - 15.0 %   Platelets 252 140 - 400 Thousand/uL   MPV 9.1 7.5 - 12.5 fL   Neutro Abs 1,976 1,500 - 7,800 cells/uL   Lymphs Abs 1,432  850 - 3,900 cells/uL   Absolute Monocytes 340 200 - 950 cells/uL   Eosinophils Absolute 232 15 - 500 cells/uL   Basophils Absolute 20 0 - 200 cells/uL   Neutrophils Relative % 49.4 %   Total Lymphocyte 35.8 %   Monocytes Relative 8.5 %   Eosinophils Relative 5.8 %   Basophils Relative 0.5 %  Iron, TIBC and Ferritin Panel     Status: Abnormal   Collection Time: 01/12/20  3:14 PM  Result Value Ref Range   Iron 40 (L) 45 - 160 mcg/dL   TIBC 351 250 - 450 mcg/dL (calc)   %SAT 11 (L) 16 - 45 % (calc)   Ferritin 32 16 - 288 ng/mL    PHQ2/9: Depression screen [facility] 2/9 02/15/2020 02/04/2020 01/12/2020 10/15/2019 06/14/2019  Decreased Interest 0 0 0 0 0  Down, Depressed, Hopeless 0 0 0 0 0  PHQ - 2 Score 0 0 0 0 0  Altered sleeping 0 - 0 0 0  Tired, decreased energy 0 - 0 0 0  Change in appetite 0 - 0 0 0  Feeling bad or failure about yourself  0 - 0 0 0  Trouble concentrating 0 - 0 0 0  Moving slowly or fidgety/restless 0 - 0 0 0  Suicidal thoughts 0 - 0 0 0  PHQ-9 Score 0 - 0 0 0  Difficult doing work/chores - - Not difficult at all Not difficult at all Not difficult at all  Some recent data might be hidden    phq 9 is negative   Fall Risk: Fall Risk  02/15/2020 02/04/2020 01/12/2020 10/15/2019 06/14/2019  Falls in the past year? 0 1 0 0 0  Number falls in past yr: - 1 0 0 0  Comment - mechanical fall - - -  Injury with Fall? 0 0 0 0 0  Risk for fall due to : - No Fall Risks - - -  Follow up - Falls prevention discussed - Falls evaluation completed -    Functional Status Survey: Is the patient deaf or have difficulty hearing?: No Does the patient have difficulty seeing, even when wearing glasses/contacts?: No Does the patient have difficulty concentrating, remembering, or making decisions?: No Does the patient have difficulty walking or climbing stairs?: No Does the patient have difficulty dressing or bathing?: No Does the patient have difficulty doing errands alone such as  visiting a doctor's office or shopping?: No    Assessment & Plan  1. Essential hypertension  - losartan (COZAAR) 50 MG tablet; Take 1 tablet (50 mg total) by mouth daily.  Dispense: 90 tablet; Refill: 1 - hydrALAZINE (APRESOLINE) 10 MG tablet; Take 1 tablet (10 mg total) by mouth 3 (three) times daily. Prn if bp above 150/90  Dispense: 60 tablet; Refill: 0  2. Anemia due to blood loss, acute  stable  3. History of diverticulosis  Still has some lower abdominal pain   4. Abdominal aortic atherosclerosis (HCC)  - lovastatin (MEVACOR) 40 MG tablet; Take 1 tablet (40 mg total) by mouth at bedtime.  Dispense: 90 tablet; Refill: 1  5. Primary osteoarthritis of right knee   6. Prediabetes  Recheck labs next visit   7. Age-related osteoporosis without current pathological fracture   8. Chronic obstructive pulmonary disease, unspecified COPD type ([place])  Not on medication   9. Mixed hyperlipidemia  - lovastatin (MEVACOR) 40 MG tablet; Take 1 tablet (40 mg total) by mouth at bedtime.  Dispense: 90 tablet; Refill: 1

## 2020-02-16 ENCOUNTER — Other Ambulatory Visit: Payer: Self-pay | Admitting: Emergency Medicine

## 2020-02-16 DIAGNOSIS — M810 Age-related osteoporosis without current pathological fracture: Secondary | ICD-10-CM

## 2020-02-16 MED ORDER — ALENDRONATE SODIUM 70 MG PO TABS: 70.0000 mg | ORAL_TABLET | ORAL | 0 refills | Status: DC

## 2020-02-18 ENCOUNTER — Other Ambulatory Visit
Admission: RE | Admit: 2020-02-18 | Discharge: 2020-02-18 | Disposition: A | Discharge status: Home / Self Care | Payer: Medicare Other | Source: Ambulatory Visit | Attending: Gastroenterology | Admitting: Gastroenterology

## 2020-02-18 DIAGNOSIS — Z20822 Contact with and (suspected) exposure to covid-19: Secondary | ICD-10-CM | POA: Diagnosis not present

## 2020-02-18 DIAGNOSIS — Z01812 Encounter for preprocedural laboratory examination: Secondary | ICD-10-CM | POA: Diagnosis present

## 2020-02-18 LAB — SARS CORONAVIRUS 2 (TAT 6-24 HRS): SARS Coronavirus 2: NEGATIVE

## 2020-02-21 ENCOUNTER — Encounter: Payer: Self-pay | Admitting: Gastroenterology

## 2020-02-22 ENCOUNTER — Ambulatory Visit: Payer: Medicare Other | Admitting: Anesthesiology

## 2020-02-22 ENCOUNTER — Ambulatory Visit
Admission: RE | Admit: 2020-02-22 | Discharge: 2020-02-22 | Disposition: A | Discharge status: Home / Self Care | Payer: Medicare Other | Attending: Gastroenterology | Admitting: Gastroenterology

## 2020-02-22 ENCOUNTER — Encounter
Admission: RE | Disposition: A | Discharge status: Home / Self Care | Payer: Self-pay | Source: Home / Self Care | Attending: Gastroenterology

## 2020-02-22 ENCOUNTER — Other Ambulatory Visit: Payer: Self-pay

## 2020-02-22 ENCOUNTER — Encounter: Payer: Self-pay | Admitting: Gastroenterology

## 2020-02-22 DIAGNOSIS — Z79899 Other long term (current) drug therapy: Secondary | ICD-10-CM | POA: Diagnosis not present

## 2020-02-22 DIAGNOSIS — Z7982 Long term (current) use of aspirin: Secondary | ICD-10-CM | POA: Diagnosis not present

## 2020-02-22 DIAGNOSIS — K449 Diaphragmatic hernia without obstruction or gangrene: Secondary | ICD-10-CM | POA: Diagnosis not present

## 2020-02-22 DIAGNOSIS — R933 Abnormal findings on diagnostic imaging of other parts of digestive tract: Secondary | ICD-10-CM | POA: Insufficient documentation

## 2020-02-22 DIAGNOSIS — Z886 Allergy status to analgesic agent status: Secondary | ICD-10-CM | POA: Diagnosis not present

## 2020-02-22 DIAGNOSIS — I10 Essential (primary) hypertension: Secondary | ICD-10-CM | POA: Diagnosis not present

## 2020-02-22 DIAGNOSIS — Z7983 Long term (current) use of bisphosphonates: Secondary | ICD-10-CM | POA: Insufficient documentation

## 2020-02-22 DIAGNOSIS — Z87891 Personal history of nicotine dependence: Secondary | ICD-10-CM | POA: Insufficient documentation

## 2020-02-22 DIAGNOSIS — K219 Gastro-esophageal reflux disease without esophagitis: Secondary | ICD-10-CM | POA: Insufficient documentation

## 2020-02-22 DIAGNOSIS — R7303 Prediabetes: Secondary | ICD-10-CM | POA: Insufficient documentation

## 2020-02-22 DIAGNOSIS — E785 Hyperlipidemia, unspecified: Secondary | ICD-10-CM | POA: Diagnosis not present

## 2020-02-22 DIAGNOSIS — Z888 Allergy status to other drugs, medicaments and biological substances status: Secondary | ICD-10-CM | POA: Diagnosis not present

## 2020-02-22 DIAGNOSIS — Z8619 Personal history of other infectious and parasitic diseases: Secondary | ICD-10-CM | POA: Insufficient documentation

## 2020-02-22 DIAGNOSIS — M1990 Unspecified osteoarthritis, unspecified site: Secondary | ICD-10-CM | POA: Diagnosis not present

## 2020-02-22 DIAGNOSIS — Z8249 Family history of ischemic heart disease and other diseases of the circulatory system: Secondary | ICD-10-CM | POA: Insufficient documentation

## 2020-02-22 DIAGNOSIS — Z8711 Personal history of peptic ulcer disease: Secondary | ICD-10-CM | POA: Diagnosis not present

## 2020-02-22 HISTORY — PX: ESOPHAGOGASTRODUODENOSCOPY (EGD) WITH PROPOFOL: SHX5813

## 2020-02-22 SURGERY — ESOPHAGOGASTRODUODENOSCOPY (EGD) WITH PROPOFOL
Anesthesia: General

## 2020-02-22 MED ORDER — SODIUM CHLORIDE 0.9 % IV SOLN: INTRAVENOUS | Status: DC

## 2020-02-22 MED ORDER — PROPOFOL 10 MG/ML IV BOLUS
INTRAVENOUS | Status: DC | PRN
  Administered 2020-02-22: 20 mg via INTRAVENOUS
  Administered 2020-02-22: 50 mg via INTRAVENOUS

## 2020-02-22 MED ORDER — LIDOCAINE HCL (CARDIAC) PF 100 MG/5ML IV SOSY
PREFILLED_SYRINGE | INTRAVENOUS | Status: DC | PRN
  Administered 2020-02-22: 50 mg via INTRAVENOUS

## 2020-02-22 MED ORDER — PROPOFOL 500 MG/50ML IV EMUL
INTRAVENOUS | Status: AC
  Filled 2020-02-22: qty 50

## 2020-02-22 NOTE — H&P (Signed)
Lucilla Lame, MD [address] [place], [place] [address] Phone:[phone] Fax : [phone]  Primary Care Physician:  Hubbard Hartshorn, FNP Primary Gastroenterologist:  Dr. Allen Norris  Pre-Procedure History & Physical: HPI:  Holly Green is a 74 y.o. female is here for an endoscopy.   Past Medical History:  Diagnosis Date  . Abdominal aortic atherosclerosis ([place]) 03/05/2017   CT scan April 2018  . Arthritis   . Bleeding disorder ([place])   . Diverticulitis 2015  . GERD (gastroesophageal reflux disease)   . History of kidney stones   . History of stomach ulcers   . Hyperlipidemia   . Hypertension   . Osteoporosis   . Pre-diabetes   . Prediabetes 10/15/2015  . Spondylolisthesis at L4-L5 level 03/07/2017    Past Surgical History:  Procedure Laterality Date  . ABDOMINAL HYSTERECTOMY    . BACK SURGERY  2010  . BREAST CYST ASPIRATION Right 1990's  . COLONOSCOPY Left 11/09/2017   Procedure: COLONOSCOPY;  Surgeon: Virgel Manifold, MD;  Location: [facility] ENDOSCOPY;  Service: Endoscopy;  Laterality: Left;  . CYSTO WITH HYDRODISTENSION N/A 08/15/2017   Procedure: CYSTOSCOPY/HYDRODISTENSION;  Surgeon: Nickie Retort, MD;  Location: [facility] ORS;  Service: Urology;  Laterality: N/A;  . ESOPHAGOGASTRODUODENOSCOPY Left 11/09/2017   Procedure: ESOPHAGOGASTRODUODENOSCOPY (EGD);  Surgeon: Virgel Manifold, MD;  Location: [facility] ENDOSCOPY;  Service: Endoscopy;  Laterality: Left;  . FOOT SURGERY Right     Prior to Admission medications   Medication Sig Start Date End Date Taking? Authorizing Provider  bisoprolol-hydrochlorothiazide (ZIAC) 10-6.25 MG tablet Take 1 tablet by mouth daily. 10/26/19  Yes Hubbard Hartshorn, FNP  hydrALAZINE (APRESOLINE) 10 MG tablet Take 1 tablet (10 mg total) by mouth 3 (three) times daily. Prn if bp above 150/90 02/15/20  Yes Sowles, Drue Stager, MD  losartan (COZAAR) 50 MG tablet Take 1 tablet (50 mg total) by mouth daily. 02/15/20  Yes Sowles, Drue Stager,  MD  acetaminophen (TYLENOL) 650 MG CR tablet Take 650-1,300 mg by mouth every 8 (eight) hours as needed for pain.    [provider]  alendronate (FOSAMAX) 70 MG tablet Take 1 tablet (70 mg total) by mouth every Sunday. Take with a full glass of water on an empty stomach. 02/20/20   Steele Sizer, MD  Calcium Carb-Cholecalciferol (CALCIUM-VITAMIN D) 600-400 MG-UNIT TABS Take 1 tablet by mouth daily.    [provider]  dicyclomine (BENTYL) 10 MG capsule Take 1 capsule (10 mg total) by mouth 4 (four) times daily as needed for up to 14 days for spasms. 12/31/19 01/18/20  Max Sane, MD  ferrous sulfate 325 (65 FE) MG EC tablet Take 1 tablet (325 mg total) by mouth daily with breakfast. 01/12/20   Steele Sizer, MD  loratadine (CLARITIN) 10 MG tablet Take 1 tablet (10 mg total) by mouth daily. 10/26/19   Hubbard Hartshorn, FNP  lovastatin (MEVACOR) 40 MG tablet Take 1 tablet (40 mg total) by mouth at bedtime. 02/15/20   Steele Sizer, MD  polyethylene glycol (MIRALAX) 17 g packet Take 17 g by mouth daily. 01/18/20   Virgel Manifold, MD    Allergies as of 02/02/2020 - Review Complete 01/18/2020  Allergen Reaction Noted  . Aspirin Other (See Comments) 08/08/2017  . Lisinopril Cough 06/14/2019    Family History  Problem Relation Age of Onset  . Hypertension Brother   . Heart disease Brother   . Heart failure Brother   . Hypertension Mother   . Stroke  Mother   . Stroke Father   . Prostate cancer Father   . Aneurysm Sister   . Breast cancer Sister 27  . Liver disease Maternal Aunt   . Heart failure Paternal Aunt   . Heart disease Paternal Uncle   . Heart attack Brother   . Heart disease Brother   . Breast cancer Maternal Aunt   . Heart disease Paternal Aunt   . Kidney cancer Neg Hx   . Bladder Cancer Neg Hx     Social History   Socioeconomic History  . Marital status: Single    Spouse name: Not on file  . Number of children: 1  . Years of education: Not on file   . Highest education level: High school graduate  Occupational History  . Occupation: retired  Tobacco Use  . Smoking status: Former Research scientist (life sciences)  . Smokeless tobacco: Never Used  . Tobacco comment: teenage years about 1 year  Substance and Sexual Activity  . Alcohol use: No    Alcohol/week: 0.0 standard drinks  . Drug use: No  . Sexual activity: Never  Other Topics Concern  . Not on file  Social History Narrative  . Not on file   Social Determinants of Health   Financial Resource Strain: Low Risk   . Difficulty of Paying Living Expenses: Not hard at all  Food Insecurity: No Food Insecurity  . Worried About Charity fundraiser in the Last Year: Never true  . Ran Out of Food in the Last Year: Never true  Transportation Needs: No Transportation Needs  . Lack of Transportation (Medical): No  . Lack of Transportation (Non-Medical): No  Physical Activity: Inactive  . Days of Exercise per Week: 0 days  . Minutes of Exercise per Session: 0 min  Stress: No Stress Concern Present  . Feeling of Stress : Not at all  Social Connections: Slightly Isolated  . Frequency of Communication with Friends and Family: More than three times a week  . Frequency of Social Gatherings with Friends and Family: Once a week  . Attends Religious Services: More than 4 times per year  . Active Member of Clubs or Organizations: Yes  . Attends Archivist Meetings: Never  . Marital Status: Never married  Intimate Partner Violence: Not At Risk  . Fear of Current or Ex-Partner: No  . Emotionally Abused: No  . Physically Abused: No  . Sexually Abused: No    Review of Systems: See HPI, otherwise negative ROS  Physical Exam: BP (!) 165/87   Pulse 77   Temp 97.6 F (36.4 C) (Temporal)   Resp 16   Ht 5\' 6"  (1.676 m)   Wt 87.5 kg   LMP  (LMP Unknown)   SpO2 100%   BMI 31.15 kg/m  General:   Alert,  pleasant and cooperative in NAD Head:  Normocephalic and atraumatic. Neck:  Supple; no masses  or thyromegaly. Lungs:  Clear throughout to auscultation.    Heart:  Regular rate and rhythm. Abdomen:  Soft, nontender and nondistended. Normal bowel sounds, without guarding, and without rebound.   Neurologic:  Alert and  oriented x4;  grossly normal neurologically.  Impression/Plan: Holly Green is here for an endoscopy to be performed for abnormal CT scan of the abdomin  Risks, benefits, limitations, and alternatives regarding  endoscopy have been reviewed with the patient.  Questions have been answered.  All parties agreeable.   Lucilla Lame, MD  02/22/2020, 10:01 AM

## 2020-02-22 NOTE — Op Note (Signed)
[facility] Gastroenterology Patient Name: Holly Green Procedure Date: 02/22/2020 10:08 AM MRN: [id number] Account #: [id number] Date of Birth: 10-04-1946 Admit Type: Outpatient Age: 74 Room: [facility] ENDO ROOM 4 Gender: Female Note Status: Finalized Procedure:             Upper GI endoscopy Indications:           Abnormal CT of the GI tract Providers:             Lucilla Lame MD, MD Medicines:             Propofol per Anesthesia Complications:         No immediate complications. Procedure:             Pre-Anesthesia Assessment:                        - Prior to the procedure, a History and Physical was                         performed, and patient medications and allergies were                         reviewed. The patient's tolerance of previous                         anesthesia was also reviewed. The risks and benefits                         of the procedure and the sedation options and risks                         were discussed with the patient. All questions were                         answered, and informed consent was obtained. Prior                         Anticoagulants: The patient has taken no previous                         anticoagulant or antiplatelet agents. ASA Grade                         Assessment: II - A patient with mild systemic disease.                         After reviewing the risks and benefits, the patient                         was deemed in satisfactory condition to undergo the                         procedure.                        After obtaining informed consent, the endoscope was                         passed under direct vision. Throughout the procedure,  the patient's blood pressure, pulse, and oxygen                         saturations were monitored continuously. The Endoscope                         was introduced through the mouth, and advanced to the                         second part of  duodenum. The upper GI endoscopy was                         accomplished without difficulty. The patient tolerated                         the procedure well. Findings:      A small hiatal hernia was present.      The stomach was normal.      The examined duodenum was normal. Impression:            - Small hiatal hernia.                        - Normal stomach.                        - Normal examined duodenum.                        - No specimens collected.                        - No cuase for the patient pain seen. Recommendation:        - Discharge patient to home.                        - Resume previous diet.                        - Continue present medications. Procedure Code(s):     --- Professional ---                        [phone], Esophagogastroduodenoscopy, flexible,                         transoral; diagnostic, including collection of                         specimen(s) by brushing or washing, when performed                         (separate procedure) Diagnosis Code(s):     --- Professional ---                        R93.3, Abnormal findings on diagnostic imaging of                         other parts of digestive tract CPT copyright 2019 American Medical Association. All rights reserved. The codes documented in this report are preliminary and upon coder review may  be revised  to meet current compliance requirements. Lucilla Lame MD, MD 02/22/2020 10:18:47 AM This report has been signed electronically. Number of Addenda: 0 Note Initiated On: 02/22/2020 10:08 AM Estimated Blood Loss:  Estimated blood loss: none.      [facility]

## 2020-02-22 NOTE — Anesthesia Postprocedure Evaluation (Signed)
Anesthesia Post Note  Patient: Holly Green  Procedure(s) Performed: ESOPHAGOGASTRODUODENOSCOPY (EGD) WITH PROPOFOL (N/A )  Patient location during evaluation: PACU Anesthesia Type: General Level of consciousness: awake and alert Pain management: pain level controlled Vital Signs Assessment: post-procedure vital signs reviewed and stable Respiratory status: spontaneous breathing, nonlabored ventilation and respiratory function stable Cardiovascular status: blood pressure returned to baseline and stable Postop Assessment: no apparent nausea or vomiting Anesthetic complications: no     Last Vitals:  Vitals:   02/22/20 1021 02/22/20 1041  BP: (!) 151/90 (!) 152/102  Pulse: 84   Resp: 13   Temp: 36.7 C   SpO2: 99%     Last Pain:  Vitals:   02/22/20 1041  TempSrc:   PainSc: 0-No pain                 Tera Mater

## 2020-02-22 NOTE — Anesthesia Preprocedure Evaluation (Addendum)
Anesthesia Evaluation  Patient identified by MRN, date of birth, ID band Patient awake    Reviewed: Allergy & Precautions, H&P , NPO status , Patient's Chart, lab work & pertinent test results  Airway Mallampati: III  TM Distance: >3 FB     Dental  (+) Teeth Intact   Pulmonary COPD, former smoker,    breath sounds clear to auscultation       Cardiovascular hypertension, (-) angina(-) Past MI negative cardio ROS  (-) dysrhythmias  Rhythm:regular Rate:Normal     Neuro/Psych negative neurological ROS  negative psych ROS   GI/Hepatic Neg liver ROS, GERD  ,  Endo/Other  negative endocrine ROS  Renal/GU negative Renal ROS  negative genitourinary   Musculoskeletal  (+) Arthritis ,   Abdominal   Peds  Hematology  (+) Blood dyscrasia, anemia ,   Anesthesia Other Findings Past Medical History: 03/05/2017: Abdominal aortic atherosclerosis ([facility])     Comment:  CT scan April 2018 No date: Arthritis No date: Bleeding disorder ([place]) 2015: Diverticulitis No date: GERD (gastroesophageal reflux disease) No date: History of kidney stones No date: History of stomach ulcers No date: Hyperlipidemia No date: Hypertension No date: Osteoporosis No date: Pre-diabetes 10/15/2015: Prediabetes 03/07/2017: Spondylolisthesis at L4-L5 level  Past Surgical History: No date: ABDOMINAL HYSTERECTOMY 2010: BACK SURGERY 1990's: BREAST CYST ASPIRATION; Right 11/09/2017: COLONOSCOPY; Left     Comment:  Procedure: COLONOSCOPY;  Surgeon: Virgel Manifold,               MD;  Location: [facility] ENDOSCOPY;  Service: Endoscopy;                Laterality: Left; 08/15/2017: CYSTO WITH HYDRODISTENSION; N/A     Comment:  Procedure: CYSTOSCOPY/HYDRODISTENSION;  Surgeon: Nickie Retort, MD;  Location: [facility] ORS;  Service: Urology;               Laterality: N/A; 11/09/2017: ESOPHAGOGASTRODUODENOSCOPY; Left     Comment:  Procedure:  ESOPHAGOGASTRODUODENOSCOPY (EGD);  Surgeon:               Virgel Manifold, MD;  Location: [facility] ENDOSCOPY;                Service: Endoscopy;  Laterality: Left; No date: FOOT SURGERY; Right  BMI    Body Mass Index: 31.15 kg/m      Reproductive/Obstetrics negative OB ROS                            Anesthesia Physical Anesthesia Plan  ASA: II  Anesthesia Plan: General   Post-op Pain Management:    Induction:   PONV Risk Score and Plan: Propofol infusion and TIVA  Airway Management Planned: Natural Airway and Nasal Cannula  Additional Equipment:   Intra-op Plan:   Post-operative Plan:   Informed Consent: I have reviewed the patients History and Physical, chart, labs and discussed the procedure including the risks, benefits and alternatives for the proposed anesthesia with the patient or authorized representative who has indicated his/her understanding and acceptance.     Dental Advisory Given  Plan Discussed with: Anesthesiologist, CRNA and Surgeon  Anesthesia Plan Comments:         Anesthesia Quick Evaluation

## 2020-02-22 NOTE — Transfer of Care (Signed)
Immediate Anesthesia Transfer of Care Note  Patient: Holly Green  Procedure(s) Performed: ESOPHAGOGASTRODUODENOSCOPY (EGD) WITH PROPOFOL (N/A )  Patient Location: PACU and Endoscopy Unit  Anesthesia Type:General  Level of Consciousness: drowsy  Airway & Oxygen Therapy: Patient Spontanous Breathing  Post-op Assessment: Report given to RN and Post -op Vital signs reviewed and stable  Post vital signs: Reviewed and stable  Last Vitals:  Vitals Value Taken Time  BP    Temp    Pulse 83 02/22/20 1021  Resp 13 02/22/20 1021  SpO2 98 % 02/22/20 1021  Vitals shown include unvalidated device data.  Last Pain:  Vitals:   02/22/20 0907  TempSrc: Temporal  PainSc: 8          Complications: No apparent anesthesia complications

## 2020-02-23 ENCOUNTER — Ambulatory Visit (INDEPENDENT_AMBULATORY_CARE_PROVIDER_SITE_OTHER): Payer: Medicare Other | Admitting: Gastroenterology

## 2020-02-23 ENCOUNTER — Encounter: Payer: Self-pay | Admitting: Gastroenterology

## 2020-02-23 DIAGNOSIS — K5900 Constipation, unspecified: Secondary | ICD-10-CM | POA: Diagnosis not present

## 2020-02-23 DIAGNOSIS — R109 Unspecified abdominal pain: Secondary | ICD-10-CM | POA: Diagnosis not present

## 2020-02-23 NOTE — Patient Instructions (Addendum)

## 2020-02-23 NOTE — Progress Notes (Signed)
[name], MD [address]  [place], [place] [address]  Main: [phone]  Fax: [phone]   Primary Care Physician: Hubbard Hartshorn, FNP  Virtual Visit via Video Note  I connected with patient on 02/23/20 at 10:30 AM EDT by video (using [email or web address]) and verified that I am speaking with the correct person using two identifiers.   I discussed the limitations, risks, security and privacy concerns of performing an evaluation and management service by video and the availability of in person appointments. I also discussed with the patient that there may be a patient responsible charge related to this service. The patient expressed understanding and agreed to proceed.  Location of Patient: Home Location of Provider: Home Persons involved: Patient and provider only (Nursing staff checked in patient via phone but were not physically involved in the video interaction - see their notes)   History of Present Illness: Chief Complaint  Patient presents with  . Follow-up    HPI: Holly Green is a 74 y.o. female following up for abdominal pain.  Patient underwent upper endoscopy which showed a small hiatal hernia and was otherwise negative.  This was done due to CT showing gastric thickening.  Patient reports bilateral lower quadrant abdominal discomfort.  Does take MiraLAX as needed.  Does have soft bowel movement on most days, but does have days where she does not have a bowel movement or has to strain.  No blood in stool.  No weight loss.  No nausea or vomiting.    Current Outpatient Medications  Medication Sig Dispense Refill  . acetaminophen (TYLENOL) 650 MG CR tablet Take 650-1,300 mg by mouth every 8 (eight) hours as needed for pain.    Marland Kitchen alendronate (FOSAMAX) 70 MG tablet Take 1 tablet (70 mg total) by mouth every Sunday. Take with a full glass of water on an empty stomach. 12 tablet 0  . bisoprolol-hydrochlorothiazide (ZIAC) 10-6.25 MG tablet Take 1 tablet by  mouth daily. 90 tablet 1  . Calcium Carb-Cholecalciferol (CALCIUM-VITAMIN D) 600-400 MG-UNIT TABS Take 1 tablet by mouth daily.    . ferrous sulfate 325 (65 FE) MG EC tablet Take 1 tablet (325 mg total) by mouth daily with breakfast. 90 tablet 0  . hydrALAZINE (APRESOLINE) 10 MG tablet Take 1 tablet (10 mg total) by mouth 3 (three) times daily. Prn if bp above 150/90 60 tablet 0  . loratadine (CLARITIN) 10 MG tablet Take 1 tablet (10 mg total) by mouth daily. 30 tablet 11  . losartan (COZAAR) 50 MG tablet Take 1 tablet (50 mg total) by mouth daily. 90 tablet 1  . lovastatin (MEVACOR) 40 MG tablet Take 1 tablet (40 mg total) by mouth at bedtime. 90 tablet 1  . polyethylene glycol (MIRALAX) 17 g packet Take 17 g by mouth daily. 30 each 2  . dicyclomine (BENTYL) 10 MG capsule Take 1 capsule (10 mg total) by mouth 4 (four) times daily as needed for up to 14 days for spasms. 56 capsule 0   No current facility-administered medications for this visit.    Allergies as of 02/23/2020 - Review Complete 02/23/2020  Allergen Reaction Noted  . Aspirin Other (See Comments) 08/08/2017  . Lisinopril Cough 06/14/2019    Review of Systems:    All systems reviewed and negative except where noted in HPI.   Observations/Objective:  Labs: CMP     Component Value Date/Time   NA 140 12/31/2019 0543   NA 141 10/15/2019 1205  NA 141 01/16/2014 0514   K 3.6 12/31/2019 0543   K 3.5 01/16/2014 0514   CL 107 12/31/2019 0543   CL 108 (H) 01/16/2014 0514   CO2 28 12/31/2019 0543   CO2 28 01/16/2014 0514   GLUCOSE 86 12/31/2019 0543   GLUCOSE 104 (H) 01/16/2014 0514   BUN 8 12/31/2019 0543   BUN 15 10/15/2019 1205   BUN 2 (L) 01/16/2014 0514   CREATININE 0.67 12/31/2019 0543   CREATININE 0.65 02/08/2019 1234   CALCIUM 8.3 (L) 12/31/2019 0543   CALCIUM 7.6 (L) 01/16/2014 0514   PROT 6.9 12/29/2019 1018   PROT 6.7 10/15/2019 1205   PROT 6.2 (L) 01/11/2014 1121   ALBUMIN 3.6 12/29/2019 1018   ALBUMIN  4.3 10/15/2019 1205   ALBUMIN 3.0 (L) 01/11/2014 1121   AST 30 12/29/2019 1018   AST 24 01/11/2014 1121   ALT 20 12/29/2019 1018   ALT 16 01/11/2014 1121   ALKPHOS 77 12/29/2019 1018   ALKPHOS 69 01/11/2014 1121   BILITOT 0.9 12/29/2019 1018   BILITOT 1.4 (H) 10/15/2019 1205   BILITOT 0.4 01/11/2014 1121   GFRNONAA >60 12/31/2019 0543   GFRNONAA 89 02/08/2019 1234   GFRAA >60 12/31/2019 0543   GFRAA 104 02/08/2019 1234   Lab Results  Component Value Date   WBC 4.0 01/12/2020   HGB 11.2 (L) 01/12/2020   HCT 33.1 (L) 01/12/2020   MCV 88.7 01/12/2020   PLT 252 01/12/2020    Imaging Studies: CT ABDOMEN PELVIS W CONTRAST  Result Date: 01/29/2020 CLINICAL DATA:  Worsening abdominal pain for 1 month. GI bleeding. Diarrhea. EXAM: CT ABDOMEN AND PELVIS WITH CONTRAST TECHNIQUE: Multidetector CT imaging of the abdomen and pelvis was performed using the standard protocol following bolus administration of intravenous contrast. CONTRAST:  138mL OMNIPAQUE IOHEXOL 300 MG/ML  SOLN COMPARISON:  11/06/2017 FINDINGS: Lower Chest: No acute findings. Hepatobiliary: No hepatic masses identified. Several small hepatic cysts remain stable. Gallbladder is unremarkable. No evidence of biliary ductal dilatation. Pancreas:  No mass or inflammatory changes. Spleen: Within normal limits in size and appearance. Adrenals/Urinary Tract: No masses identified. Small left renal cyst again noted. No evidence of ureteral calculi or hydronephrosis. Stomach/Bowel: Wall thickening of the gastric antrum is noted which persists on delayed images and is suspicious for gastritis. No evidence of bowel obstruction or abnormal fluid collections. Normal appendix visualized. Diffuse colonic diverticulosis is again demonstrated, however there is no evidence of diverticulitis. Vascular/Lymphatic: No pathologically enlarged lymph nodes. No abdominal aortic aneurysm. Aortic atherosclerosis incidentally noted. Reproductive: Prior hysterectomy  noted. Adnexal regions are unremarkable in appearance. Other:  None. Musculoskeletal:  No suspicious bone lesions identified. IMPRESSION: 1. Wall thickening of the gastric antrum, suspicious for gastritis/peptic ulcer disease. Consider upper endoscopy for further evaluation. 2. Diffuse colonic diverticulosis. No radiographic evidence of diverticulitis. Electronically Signed   By: Marlaine Hind M.D.   On: 01/29/2020 10:22    Assessment and Plan:   Holly Green is a 74 y.o. y/o female here for follow-up of abdominal pain  Assessment and Plan: Recent CT scan was reassuring  Suspect that her pain is likely due to constipation given that she is having some days without bowel movements and is not taking MiraLAX daily  Advised on taking MiraLAX every day with instructions to skip it or hold it only for 1 to 2 days if more than 2 loose stools in any given day  No alarm symptoms present  Follow Up Instructions:    I  discussed the assessment and treatment plan with the patient. The patient was provided an opportunity to ask questions and all were answered. The patient agreed with the plan and demonstrated an understanding of the instructions.   The patient was advised to call back or seek an in-person evaluation if the symptoms worsen or if the condition fails to improve as anticipated.  I provided 15 minutes of face-to-face time via video software during this encounter. Additional time was spent in reviewing patient's chart, placing orders etc.   Virgel Manifold, MD  Speech recognition software was used to dictate this note.

## 2020-03-21 ENCOUNTER — Encounter: Payer: Self-pay | Admitting: Gastroenterology

## 2020-03-22 ENCOUNTER — Telehealth (INDEPENDENT_AMBULATORY_CARE_PROVIDER_SITE_OTHER): Payer: Medicare Other | Admitting: Gastroenterology

## 2020-03-22 ENCOUNTER — Encounter: Payer: Self-pay | Admitting: Gastroenterology

## 2020-03-22 ENCOUNTER — Other Ambulatory Visit: Payer: Self-pay

## 2020-03-22 DIAGNOSIS — D509 Iron deficiency anemia, unspecified: Secondary | ICD-10-CM | POA: Diagnosis not present

## 2020-03-22 MED ORDER — NA SULFATE-K SULFATE-MG SULF 17.5-3.13-1.6 GM/177ML PO SOLN: 354.0000 mL | Freq: Once | ORAL | 0 refills | Status: AC

## 2020-03-22 NOTE — Progress Notes (Signed)
Holly Antigua, MD [address]  [place]  [place],  [address]  Main: [phone]  Fax: [phone]   Primary Care Physician: Hubbard Hartshorn, FNP  Virtual Visit via Video Note  I connected with patient on 03/22/20 at 10:15 AM EDT by video (using [email or web address]) and verified that I am speaking with the correct person using two identifiers.   I discussed the limitations, risks, security and privacy concerns of performing an evaluation and management service by video and the availability of in person appointments. I also discussed with the patient that there may be a patient responsible charge related to this service. The patient expressed understanding and agreed to proceed.  Location of Patient: Home Location of Provider: Home Persons involved: Patient and provider only (Nursing staff checked in patient via phone but were not physically involved in the video interaction - see their notes)   History of Present Illness: Chief Complaint  Patient presents with  . Abdominal Pain    Patient is still having some lower abdominal pain   . Constipation    Has improved     HPI: Holly Green is a 74 y.o. female here for follow-up of lower abdominal pain.  Has been taking MiraLAX daily and reports 1-2 soft bowel movements a day.  With this her abdominal pain is much improved.  No nausea or vomiting.  No blood in stool.  Is on oral iron replacement by PCP, and continues to have iron deficiency.  Previous history: She underwent EGD in March 2021 due to a CT showing gastric thickening.  Upper endoscopy was normal except for hiatal hernia   Patient has a history of diverticular bleeding, last hospitalized for it in February 2021.  2018 procedures during hospitalization: "She underwent an EGD that was normal on that admission, and a colonoscopy that showed diffuse diverticulosis, and the cecum, ascending colon, descending colon and sigmoid colon. Majority of the diverticulosis  was in the left colon. None of these were actively bleeding and no intervention was needed during the colonoscopy.   Repeat recommended in 5 years.  She has history of diverticular bleeds in the past. In 2015 she had similar symptoms and and colonoscopy was planned but due to significant bleeding, RBC scan was done and was positive for hemorrhage in the right colon likely related to bleeding diverticulum. Patient underwent coil embolization at that time. Prior to this in 2009 she also had a positive RBC scan in the mid transverse and descending colon consistent with GI bleed.  In 2012 patient had an upper endoscopy which showed hiatal hernia, erosive gastropathy, colonoscopy was done for a lot of abdominal pain at the time and showed solitary ulcer in the descending colon."  Current Outpatient Medications  Medication Sig Dispense Refill  . acetaminophen (TYLENOL) 650 MG CR tablet Take 650-1,300 mg by mouth every 8 (eight) hours as needed for pain.    Marland Kitchen alendronate (FOSAMAX) 70 MG tablet Take 1 tablet (70 mg total) by mouth every Sunday. Take with a full glass of water on an empty stomach. 12 tablet 0  . bisoprolol-hydrochlorothiazide (ZIAC) 10-6.25 MG tablet Take 1 tablet by mouth daily. 90 tablet 1  . Calcium Carb-Cholecalciferol (CALCIUM-VITAMIN D) 600-400 MG-UNIT TABS Take 1 tablet by mouth daily.    . hydrALAZINE (APRESOLINE) 10 MG tablet Take 1 tablet (10 mg total) by mouth 3 (three) times daily. Prn if bp above 150/90 60 tablet 0  . loratadine (CLARITIN) 10 MG tablet Take 1  tablet (10 mg total) by mouth daily. 30 tablet 11  . losartan (COZAAR) 50 MG tablet Take 1 tablet (50 mg total) by mouth daily. 90 tablet 1  . lovastatin (MEVACOR) 40 MG tablet Take 1 tablet (40 mg total) by mouth at bedtime. 90 tablet 1  . polyethylene glycol (MIRALAX) 17 g packet Take 17 g by mouth daily. 30 each 2   No current facility-administered medications for this visit.    Allergies as of 03/22/2020 -  Review Complete 03/22/2020  Allergen Reaction Noted  . Aspirin Other (See Comments) 08/08/2017  . Lisinopril Cough 06/14/2019    Review of Systems:    All systems reviewed and negative except where noted in HPI.   Observations/Objective:  Labs: CMP     Component Value Date/Time   NA 140 12/31/2019 0543   NA 141 10/15/2019 1205   NA 141 01/16/2014 0514   K 3.6 12/31/2019 0543   K 3.5 01/16/2014 0514   CL 107 12/31/2019 0543   CL 108 (H) 01/16/2014 0514   CO2 28 12/31/2019 0543   CO2 28 01/16/2014 0514   GLUCOSE 86 12/31/2019 0543   GLUCOSE 104 (H) 01/16/2014 0514   BUN 8 12/31/2019 0543   BUN 15 10/15/2019 1205   BUN 2 (L) 01/16/2014 0514   CREATININE 0.67 12/31/2019 0543   CREATININE 0.65 02/08/2019 1234   CALCIUM 8.3 (L) 12/31/2019 0543   CALCIUM 7.6 (L) 01/16/2014 0514   PROT 6.9 12/29/2019 1018   PROT 6.7 10/15/2019 1205   PROT 6.2 (L) 01/11/2014 1121   ALBUMIN 3.6 12/29/2019 1018   ALBUMIN 4.3 10/15/2019 1205   ALBUMIN 3.0 (L) 01/11/2014 1121   AST 30 12/29/2019 1018   AST 24 01/11/2014 1121   ALT 20 12/29/2019 1018   ALT 16 01/11/2014 1121   ALKPHOS 77 12/29/2019 1018   ALKPHOS 69 01/11/2014 1121   BILITOT 0.9 12/29/2019 1018   BILITOT 1.4 (H) 10/15/2019 1205   BILITOT 0.4 01/11/2014 1121   GFRNONAA >60 12/31/2019 0543   GFRNONAA 89 02/08/2019 1234   GFRAA >60 12/31/2019 0543   GFRAA 104 02/08/2019 1234   Lab Results  Component Value Date   WBC 4.0 01/12/2020   HGB 11.2 (L) 01/12/2020   HCT 33.1 (L) 01/12/2020   MCV 88.7 01/12/2020   PLT 252 01/12/2020    Imaging Studies: No results found.  Assessment and Plan:   Holly Green is a 74 y.o. y/o female here for follow-up of constipation, improved history of diverticular bleeding  Assessment and Plan: Constipation doing much better and so is her lower abdominal pain due to constipation with MiraLAX daily  High-fiber diet encouraged  Patient continues to have iron deficiency anemia despite  iron replacement.  Hematology referral discussed and patient agreeable.  It has been over 2 years since her last colonoscopy and due to ongoing iron deficiency anemia, colonoscopy is recommended at this time  I have discussed alternative options, risks & benefits,  which include, but are not limited to, bleeding, infection, perforation,respiratory complication & drug reaction.  The patient agrees with this plan & written consent will be obtained.     Follow Up Instructions:    I discussed the assessment and treatment plan with the patient. The patient was provided an opportunity to ask questions and all were answered. The patient agreed with the plan and demonstrated an understanding of the instructions.   The patient was advised to call back or seek an in-person evaluation if the  symptoms worsen or if the condition fails to improve as anticipated.  I provided 15 minutes of face-to-face time via video software during this encounter. Additional time was spent in reviewing patient's chart, placing orders etc.   Virgel Manifold, MD  Speech recognition software was used to dictate this note.

## 2020-03-27 ENCOUNTER — Encounter: Payer: Self-pay | Admitting: Oncology

## 2020-03-27 NOTE — Progress Notes (Signed)
Patient prescreened for New patient appt. Referred by Dr. Bonna Gains for IDA. Patient reports feeling tired, with no energy.

## 2020-03-28 ENCOUNTER — Other Ambulatory Visit: Payer: Self-pay

## 2020-03-28 ENCOUNTER — Inpatient Hospital Stay: Payer: Medicare Other | Attending: Oncology | Admitting: Oncology

## 2020-03-28 ENCOUNTER — Inpatient Hospital Stay: Payer: Medicare Other

## 2020-03-28 VITALS — BP 167/91 | HR 66 | Temp 95.9°F | Resp 18 | Wt 191.8 lb

## 2020-03-28 DIAGNOSIS — Z87891 Personal history of nicotine dependence: Secondary | ICD-10-CM | POA: Insufficient documentation

## 2020-03-28 DIAGNOSIS — R0609 Other forms of dyspnea: Secondary | ICD-10-CM | POA: Insufficient documentation

## 2020-03-28 DIAGNOSIS — K5730 Diverticulosis of large intestine without perforation or abscess without bleeding: Secondary | ICD-10-CM | POA: Diagnosis not present

## 2020-03-28 DIAGNOSIS — D509 Iron deficiency anemia, unspecified: Secondary | ICD-10-CM | POA: Diagnosis present

## 2020-03-28 DIAGNOSIS — Z79899 Other long term (current) drug therapy: Secondary | ICD-10-CM | POA: Diagnosis not present

## 2020-03-28 DIAGNOSIS — R5383 Other fatigue: Secondary | ICD-10-CM | POA: Diagnosis not present

## 2020-03-28 DIAGNOSIS — M1990 Unspecified osteoarthritis, unspecified site: Secondary | ICD-10-CM | POA: Insufficient documentation

## 2020-03-28 DIAGNOSIS — K219 Gastro-esophageal reflux disease without esophagitis: Secondary | ICD-10-CM | POA: Insufficient documentation

## 2020-03-28 DIAGNOSIS — E785 Hyperlipidemia, unspecified: Secondary | ICD-10-CM | POA: Diagnosis not present

## 2020-03-28 DIAGNOSIS — I10 Essential (primary) hypertension: Secondary | ICD-10-CM | POA: Diagnosis not present

## 2020-03-28 DIAGNOSIS — D649 Anemia, unspecified: Secondary | ICD-10-CM

## 2020-03-28 LAB — CBC WITH DIFFERENTIAL/PLATELET
Abs Immature Granulocytes: 0.03 10*3/uL (ref 0.00–0.07)
Basophils Absolute: 0 10*3/uL (ref 0.0–0.1)
Basophils Relative: 0 %
Eosinophils Absolute: 0.1 10*3/uL (ref 0.0–0.5)
Eosinophils Relative: 2 %
HCT: 36.5 % (ref 36.0–46.0)
Hemoglobin: 12 g/dL (ref 12.0–15.0)
Immature Granulocytes: 1 %
Lymphocytes Relative: 35 %
Lymphs Abs: 1.8 10*3/uL (ref 0.7–4.0)
MCH: 28.8 pg (ref 26.0–34.0)
MCHC: 32.9 g/dL (ref 30.0–36.0)
MCV: 87.7 fL (ref 80.0–100.0)
Monocytes Absolute: 0.4 10*3/uL (ref 0.1–1.0)
Monocytes Relative: 8 %
Neutro Abs: 2.8 10*3/uL (ref 1.7–7.7)
Neutrophils Relative %: 54 %
Platelets: 201 10*3/uL (ref 150–400)
RBC: 4.16 MIL/uL (ref 3.87–5.11)
RDW: 12.8 % (ref 11.5–15.5)
WBC: 5.1 10*3/uL (ref 4.0–10.5)
nRBC: 0 % (ref 0.0–0.2)

## 2020-03-28 LAB — COMPREHENSIVE METABOLIC PANEL
ALT: 19 U/L (ref 0–44)
AST: 23 U/L (ref 15–41)
Albumin: 3.9 g/dL (ref 3.5–5.0)
Alkaline Phosphatase: 86 U/L (ref 38–126)
Anion gap: 8 (ref 5–15)
BUN: 18 mg/dL (ref 8–23)
CO2: 26 mmol/L (ref 22–32)
Calcium: 9.3 mg/dL (ref 8.9–10.3)
Chloride: 103 mmol/L (ref 98–111)
Creatinine, Ser: 0.58 mg/dL (ref 0.44–1.00)
GFR calc Af Amer: >60 mL/min (ref >60)
GFR calc non Af Amer: >60 mL/min (ref >60)
Glucose, Bld: 100 mg/dL — ABNORMAL HIGH (ref 70–99)
Potassium: 3.7 mmol/L (ref 3.5–5.1)
Sodium: 137 mmol/L (ref 135–145)
Total Bilirubin: 1.2 mg/dL (ref 0.3–1.2)
Total Protein: 7.2 g/dL (ref 6.5–8.1)

## 2020-03-28 LAB — RETIC PANEL
Immature Retic Fract: 13.4 % (ref 2.3–15.9)
RBC.: 4.11 MIL/uL (ref 3.87–5.11)
Retic Count, Absolute: 74 10*3/uL (ref 19.0–186.0)
Retic Ct Pct: 1.8 % (ref 0.4–3.1)
Reticulocyte Hemoglobin: 33.4 pg (ref >27.9)

## 2020-03-28 LAB — IRON AND TIBC
Iron: 111 ug/dL (ref 28–170)
Saturation Ratios: 30 % (ref 10.4–31.8)
TIBC: 368 ug/dL (ref 250–450)
UIBC: 257 ug/dL

## 2020-03-28 LAB — FERRITIN: Ferritin: 25 ng/mL (ref 11–307)

## 2020-03-28 NOTE — Progress Notes (Signed)
Hematology/Oncology Consult note [facility] [phone] [phone]   Patient Care Team: Hubbard Hartshorn, FNP as PCP - General (Family Medicine) Marlowe Sax, MD as Referring Physician (Rheumatology) Hubbard Hartshorn, FNP as Nurse Practitioner (Family Medicine) Virgel Manifold, MD as Consulting Physician (Gastroenterology)  REFERRING PROVIDER: Virgel Manifold, MD CHIEF COMPLAINTS/REASON FOR VISIT:  Evaluation of iron deficiency anemia  HISTORY OF PRESENTING ILLNESS:  Holly Green is a  74 y.o.  female with PMH listed below was seen in consultation at the request of Virgel Manifold, MD   for evaluation of iron deficiency anemia.  Patient has a remote history of diverticula bleeding status post positive bleeding scan and embolization 2009 and 2015. Patient was admitted from 12/29/2019-12/31/2019 due to GI bleeding.  Patient had a 2 g hemoglobin drop over the past 2 months prior to admission.  Hemoglobin was 10.  Iron panel showed iron deficiency with ferritin off 32 and iron saturation at 11.  Conservative measures was recommended by gastroenterology.  Patient was discharged. 02/22/2020, EGD showed small hiatal hernia, normal stomach and duodenum.  No specimen was collected. 11/09/2017, colonoscopy showed: Diverticulosis.  Otherwise normal. No recent blood work after February was done. Patient is currently taking oral iron supplementation . Associated signs and symptoms: Patient reports fatigue.  SOB with exertion.  Denies weight loss, easy bruising, hematochezia, hemoptysis, hematuria.  Review of Systems  Constitutional: Negative for appetite change, chills, fatigue and fever.  HENT:   Negative for hearing loss and voice change.   Eyes: Negative for eye problems.  Respiratory: Negative for chest tightness and cough.   Cardiovascular: Negative for chest pain.  Gastrointestinal: Negative for abdominal distention, abdominal  pain and blood in stool.  Endocrine: Negative for hot flashes.  Genitourinary: Negative for difficulty urinating and frequency.   Musculoskeletal: Negative for arthralgias.  Skin: Negative for itching and rash.  Neurological: Negative for extremity weakness.  Hematological: Negative for adenopathy.  Psychiatric/Behavioral: Negative for confusion.    MEDICAL HISTORY:  Past Medical History:  Diagnosis Date  . Abdominal aortic atherosclerosis ([place]) 03/05/2017   CT scan April 2018  . Arthritis   . Bleeding disorder ([place])   . Diverticulitis 2015  . GERD (gastroesophageal reflux disease)   . History of kidney stones   . History of stomach ulcers   . Hyperlipidemia   . Hypertension   . Osteoporosis   . Pre-diabetes   . Prediabetes 10/15/2015  . Spondylolisthesis at L4-L5 level 03/07/2017    SURGICAL HISTORY: Past Surgical History:  Procedure Laterality Date  . ABDOMINAL HYSTERECTOMY    . BACK SURGERY  2010  . BREAST CYST ASPIRATION Right 1990's  . COLONOSCOPY Left 11/09/2017   Procedure: COLONOSCOPY;  Surgeon: Virgel Manifold, MD;  Location: [facility] ENDOSCOPY;  Service: Endoscopy;  Laterality: Left;  . CYSTO WITH HYDRODISTENSION N/A 08/15/2017   Procedure: CYSTOSCOPY/HYDRODISTENSION;  Surgeon: Nickie Retort, MD;  Location: [facility] ORS;  Service: Urology;  Laterality: N/A;  . ESOPHAGOGASTRODUODENOSCOPY Left 11/09/2017   Procedure: ESOPHAGOGASTRODUODENOSCOPY (EGD);  Surgeon: Virgel Manifold, MD;  Location[facility] ENDOSCOPY;  Service: Endoscopy;  Laterality: Left;  . ESOPHAGOGASTRODUODENOSCOPY (EGD) WITH PROPOFOL N/A 02/22/2020   Procedure: ESOPHAGOGASTRODUODENOSCOPY (EGD) WITH PROPOFOL;  Surgeon: Lucilla Lame, MD;  Location: [facility] ENDOSCOPY;  Service: Endoscopy;  Laterality: N/A;  . FOOT SURGERY Right     SOCIAL HISTORY: Social History   Socioeconomic History  . Marital status: Single    Spouse name: Not on file  .  Number of children: 1  . Years of education: Not on  file  . Highest education level: High school graduate  Occupational History  . Occupation: retired  Tobacco Use  . Smoking status: Former Research scientist (life sciences)  . Smokeless tobacco: Never Used  . Tobacco comment: teenage years about 1 year  Substance and Sexual Activity  . Alcohol use: No    Alcohol/week: 0.0 standard drinks  . Drug use: No  . Sexual activity: Never  Other Topics Concern  . Not on file  Social History Narrative  . Not on file   Social Determinants of Health   Financial Resource Strain: Low Risk   . Difficulty of Paying Living Expenses: Not hard at all  Food Insecurity: No Food Insecurity  . Worried About Charity fundraiser in the Last Year: Never true  . Ran Out of Food in the Last Year: Never true  Transportation Needs: No Transportation Needs  . Lack of Transportation (Medical): No  . Lack of Transportation (Non-Medical): No  Physical Activity: Inactive  . Days of Exercise per Week: 0 days  . Minutes of Exercise per Session: 0 min  Stress: No Stress Concern Present  . Feeling of Stress : Not at all  Social Connections: Slightly Isolated  . Frequency of Communication with Friends and Family: More than three times a week  . Frequency of Social Gatherings with Friends and Family: Once a week  . Attends Religious Services: More than 4 times per year  . Active Member of Clubs or Organizations: Yes  . Attends Archivist Meetings: Never  . Marital Status: Never married  Intimate Partner Violence: Not At Risk  . Fear of Current or Ex-Partner: No  . Emotionally Abused: No  . Physically Abused: No  . Sexually Abused: No    FAMILY HISTORY: Family History  Problem Relation Age of Onset  . Hypertension Brother   . Heart disease Brother   . Heart failure Brother   . Hypertension Mother   . Stroke Mother   . Stroke Father   . Prostate cancer Father   . Aneurysm Sister   . Breast cancer Sister 99  . Liver disease Maternal Aunt   . Heart failure Paternal Aunt    . Heart disease Paternal Uncle   . Heart attack Brother   . Heart disease Brother   . Breast cancer Maternal Aunt   . Heart disease Paternal Aunt   . Kidney cancer Neg Hx   . Bladder Cancer Neg Hx     ALLERGIES:  is allergic to aspirin and lisinopril.  MEDICATIONS:  Current Outpatient Medications  Medication Sig Dispense Refill  . acetaminophen (TYLENOL) 650 MG CR tablet Take 650-1,300 mg by mouth every 8 (eight) hours as needed for pain.    Marland Kitchen alendronate (FOSAMAX) 70 MG tablet Take 1 tablet (70 mg total) by mouth every Sunday. Take with a full glass of water on an empty stomach. 12 tablet 0  . bisoprolol-hydrochlorothiazide (ZIAC) 10-6.25 MG tablet Take 1 tablet by mouth daily. 90 tablet 1  . Calcium Carb-Cholecalciferol (CALCIUM-VITAMIN D) 600-400 MG-UNIT TABS Take 1 tablet by mouth daily.    . hydrALAZINE (APRESOLINE) 10 MG tablet Take 1 tablet (10 mg total) by mouth 3 (three) times daily. Prn if bp above 150/90 60 tablet 0  . loratadine (CLARITIN) 10 MG tablet Take 1 tablet (10 mg total) by mouth daily. 30 tablet 11  . losartan (COZAAR) 50 MG tablet Take 1 tablet (50 mg total)  by mouth daily. 90 tablet 1  . lovastatin (MEVACOR) 40 MG tablet Take 1 tablet (40 mg total) by mouth at bedtime. 90 tablet 1  . polyethylene glycol (MIRALAX) 17 g packet Take 17 g by mouth daily. 30 each 2   No current facility-administered medications for this visit.     PHYSICAL EXAMINATION: ECOG PERFORMANCE STATUS: 0 - Asymptomatic Vitals:   03/28/20 1141  BP: (!) 167/91  Pulse: 66  Resp: 18  Temp: (!) 95.9 F (35.5 C)   Filed Weights   03/28/20 1141  Weight: 191 lb 12.8 oz (87 kg)    Physical Exam Constitutional:      General: She is not in acute distress. HENT:     Head: Normocephalic and atraumatic.  Eyes:     General: No scleral icterus. Cardiovascular:     Rate and Rhythm: Normal rate and regular rhythm.     Heart sounds: Normal heart sounds.  Pulmonary:     Effort:  Pulmonary effort is normal. No respiratory distress.     Breath sounds: No wheezing.  Abdominal:     General: Bowel sounds are normal. There is no distension.     Palpations: Abdomen is soft.  Musculoskeletal:        General: No deformity. Normal range of motion.     Cervical back: Normal range of motion and neck supple.  Skin:    General: Skin is warm and dry.     Findings: No erythema or rash.  Neurological:     Mental Status: She is alert and oriented to person, place, and time. Mental status is at baseline.     Cranial Nerves: No cranial nerve deficit.     Coordination: Coordination normal.  Psychiatric:        Mood and Affect: Mood normal.       CMP Latest Ref Rng & Units 03/28/2020  Glucose 70 - 99 mg/dL 100(H)  BUN 8 - 23 mg/dL 18  Creatinine 0.44 - 1.00 mg/dL 0.58  Sodium 135 - 145 mmol/L 137  Potassium 3.5 - 5.1 mmol/L 3.7  Chloride 98 - 111 mmol/L 103  CO2 22 - 32 mmol/L 26  Calcium 8.9 - 10.3 mg/dL 9.3  Total Protein 6.5 - 8.1 g/dL 7.2  Total Bilirubin 0.3 - 1.2 mg/dL 1.2  Alkaline Phos 38 - 126 U/L 86  AST 15 - 41 U/L 23  ALT 0 - 44 U/L 19   CBC Latest Ref Rng & Units 03/28/2020  WBC 4.0 - 10.5 K/uL 5.1  Hemoglobin 12.0 - 15.0 g/dL 12.0  Hematocrit 36.0 - 46.0 % 36.5  Platelets 150 - 400 K/uL 201     LABORATORY DATA:  I have reviewed the data as listed Lab Results  Component Value Date   WBC 5.1 03/28/2020   HGB 12.0 03/28/2020   HCT 36.5 03/28/2020   MCV 87.7 03/28/2020   PLT 201 03/28/2020   Recent Labs    10/15/19 1205 10/15/19 1205 12/29/19 1018 12/29/19 1018 12/30/19 0315 12/31/19 0543 03/28/20 1241  NA 141  --  142   < > 139 140 137  K 3.8   < > 4.2   < > 3.4* 3.6 3.7  CL 101   < > 101   < > 104 107 103  CO2 21   < > 27   < > 29 28 26   GLUCOSE 87  --  110*   < > 87 86 100*  BUN 15  --  21   < >  12 8 18   CREATININE 0.62   < > 0.68   < > 0.65 0.67 0.58  CALCIUM 9.3   < > 9.2   < > 8.7* 8.3* 9.3  GFRNONAA 90   < > >60   < > >60 >60  >60  GFRAA 104   < > >60   < > >60 >60 >60  PROT 6.7  --  6.9  --   --   --  7.2  ALBUMIN 4.3  --  3.6  --   --   --  3.9  AST 23  --  30  --   --   --  23  ALT 14  --  20  --   --   --  19  ALKPHOS 97  --  77  --   --   --  86  BILITOT 1.4*  --  0.9  --   --   --  1.2   < > = values in this interval not displayed.   Iron/TIBC/Ferritin/ %Sat    Component Value Date/Time   IRON 111 03/28/2020 1241   IRON 36 12/15/2017 1339   TIBC 368 03/28/2020 1241   TIBC 408 12/15/2017 1339   FERRITIN 25 03/28/2020 1241   FERRITIN 39 12/15/2017 1339   IRONPCTSAT 30 03/28/2020 1241   IRONPCTSAT 11 (L) 01/12/2020 1514     RADIOGRAPHIC STUDIES: I have personally reviewed the radiological images as listed and agreed with the findings in the report. No results found.     ASSESSMENT & PLAN:  1. Iron deficiency anemia, unspecified iron deficiency anemia type   2. Diverticulosis of colon    Previous lab results were reviewed and discussed with patient.  Her blood work done in February was consistent with iron deficiency anemia. Patient has been on oral iron supplementation and tolerates well. I recommend to repeat CBC, CMP, iron, TIBC ferritin.  -Labs reviewed and discussed with patient. Patient's hemoglobin has normalized to 12, iron saturation 30, ferritin 25. Both hemoglobin and iron panel have improved comparing to her blood work in February.  No need for IV iron treatments. She does not need oral iron supplementation at this point.  In the future if she bleeds, she may need to resume oral iron supplementation or IV Venofer treatments.   Orders Placed This Encounter  Procedures  . CBC with Differential/Platelet    Standing Status:   Future    Number of Occurrences:   1    Standing Expiration Date:   03/28/2021  . Comprehensive metabolic panel    Standing Status:   Future    Number of Occurrences:   1    Standing Expiration Date:   03/28/2021  . Ferritin    Standing Status:   Future     Number of Occurrences:   1    Standing Expiration Date:   03/28/2021  . Iron and TIBC    Standing Status:   Future    Number of Occurrences:   1    Standing Expiration Date:   03/28/2021  . Retic Panel    Standing Status:   Future    Number of Occurrences:   1    Standing Expiration Date:   03/28/2021    All questions were answered. The patient knows to call the clinic with any problems questions or concerns.  Cc Virgel Manifold, MD  Return of visit: 6 months  Earlie Server, MD, PhD Hematology Oncology [place]  [facility] 03/28/2020

## 2020-03-29 ENCOUNTER — Telehealth: Payer: Self-pay

## 2020-03-29 NOTE — Telephone Encounter (Signed)
Done. Pt is aware of her scheduled appts. 

## 2020-03-29 NOTE — Telephone Encounter (Signed)
-----   Message from Earlie Server, MD sent at 03/28/2020  9:59 PM EDT ----- Please let her know that her blood level has improved as well as iron level. No need for IV iron. She may discontinue iron supplementation. Please arrange her to lab md follow up in 6 months. -labs prior. If she bleeds again, needs to update [name]. Thanks.

## 2020-03-29 NOTE — Telephone Encounter (Signed)
Patient notified of results and to call the office if she has any bleeding before her next scheduled appt.

## 2020-04-11 ENCOUNTER — Telehealth: Payer: Self-pay | Admitting: Family Medicine

## 2020-04-11 NOTE — Chronic Care Management (AMB) (Signed)
  Chronic Care Management   Note  04/11/2020 Name: KRISA BLATTNER MRN: [id number] DOB: Jul 28, 1946  [name] is a 74 y.o. year old female who is a primary care patient of Hubbard Hartshorn, FNP. I reached out to [name] by phone today in response to a referral sent by Ms. Achille Rich Coltrin's health plan.     Ms. Locher was given information about Chronic Care Management services today including:  1. CCM service includes personalized support from designated clinical staff supervised by her physician, including individualized plan of care and coordination with other care providers 2. 24/7 contact phone numbers for assistance for urgent and routine care needs. 3. Service will only be billed when office clinical staff spend 20 minutes or more in a month to coordinate care. 4. Only one practitioner may furnish and bill the service in a calendar month. 5. The patient may stop CCM services at any time (effective at the end of the month) by phone call to the office staff. 6. The patient will be responsible for cost sharing (co-pay) of up to 20% of the service fee (after annual deductible is met).  Patient agreed to services and verbal consent obtained.   Follow up plan: Telephone appointment with care management team member scheduled for:05/01/2020  Noreene Larsson, [place], [place], [place] [address] Direct Dial: [phone] [email or web address]: [email or web address]

## 2020-04-12 ENCOUNTER — Other Ambulatory Visit
Admission: RE | Admit: 2020-04-12 | Discharge: 2020-04-12 | Disposition: A | Discharge status: Home / Self Care | Payer: Medicare Other | Source: Ambulatory Visit | Attending: Gastroenterology | Admitting: Gastroenterology

## 2020-04-12 ENCOUNTER — Other Ambulatory Visit: Payer: Self-pay

## 2020-04-12 DIAGNOSIS — Z20822 Contact with and (suspected) exposure to covid-19: Secondary | ICD-10-CM | POA: Insufficient documentation

## 2020-04-12 DIAGNOSIS — Z01812 Encounter for preprocedural laboratory examination: Secondary | ICD-10-CM | POA: Diagnosis present

## 2020-04-13 LAB — SARS CORONAVIRUS 2 (TAT 6-24 HRS): SARS Coronavirus 2: NEGATIVE

## 2020-04-14 ENCOUNTER — Ambulatory Visit: Payer: Medicare Other | Admitting: Registered Nurse

## 2020-04-14 ENCOUNTER — Encounter: Payer: Self-pay | Admitting: Gastroenterology

## 2020-04-14 ENCOUNTER — Ambulatory Visit
Admission: RE | Admit: 2020-04-14 | Discharge: 2020-04-14 | Disposition: A | Discharge status: Home / Self Care | Payer: Medicare Other | Attending: Gastroenterology | Admitting: Gastroenterology

## 2020-04-14 ENCOUNTER — Encounter
Admission: RE | Disposition: A | Discharge status: Home / Self Care | Payer: Self-pay | Source: Home / Self Care | Attending: Gastroenterology

## 2020-04-14 ENCOUNTER — Other Ambulatory Visit: Payer: Self-pay

## 2020-04-14 DIAGNOSIS — K219 Gastro-esophageal reflux disease without esophagitis: Secondary | ICD-10-CM | POA: Diagnosis not present

## 2020-04-14 DIAGNOSIS — M810 Age-related osteoporosis without current pathological fracture: Secondary | ICD-10-CM | POA: Insufficient documentation

## 2020-04-14 DIAGNOSIS — K5730 Diverticulosis of large intestine without perforation or abscess without bleeding: Secondary | ICD-10-CM | POA: Insufficient documentation

## 2020-04-14 DIAGNOSIS — D509 Iron deficiency anemia, unspecified: Secondary | ICD-10-CM

## 2020-04-14 DIAGNOSIS — Z1211 Encounter for screening for malignant neoplasm of colon: Secondary | ICD-10-CM

## 2020-04-14 DIAGNOSIS — I10 Essential (primary) hypertension: Secondary | ICD-10-CM | POA: Diagnosis not present

## 2020-04-14 DIAGNOSIS — R7303 Prediabetes: Secondary | ICD-10-CM | POA: Diagnosis not present

## 2020-04-14 DIAGNOSIS — J449 Chronic obstructive pulmonary disease, unspecified: Secondary | ICD-10-CM | POA: Insufficient documentation

## 2020-04-14 DIAGNOSIS — Z79899 Other long term (current) drug therapy: Secondary | ICD-10-CM | POA: Insufficient documentation

## 2020-04-14 DIAGNOSIS — M1990 Unspecified osteoarthritis, unspecified site: Secondary | ICD-10-CM | POA: Insufficient documentation

## 2020-04-14 DIAGNOSIS — Z87442 Personal history of urinary calculi: Secondary | ICD-10-CM | POA: Insufficient documentation

## 2020-04-14 DIAGNOSIS — I700 Atherosclerosis of aorta: Secondary | ICD-10-CM | POA: Insufficient documentation

## 2020-04-14 DIAGNOSIS — E785 Hyperlipidemia, unspecified: Secondary | ICD-10-CM | POA: Diagnosis not present

## 2020-04-14 DIAGNOSIS — Z8249 Family history of ischemic heart disease and other diseases of the circulatory system: Secondary | ICD-10-CM | POA: Insufficient documentation

## 2020-04-14 HISTORY — PX: COLONOSCOPY WITH PROPOFOL: SHX5780

## 2020-04-14 SURGERY — COLONOSCOPY WITH PROPOFOL
Anesthesia: General

## 2020-04-14 MED ORDER — LIDOCAINE HCL (CARDIAC) PF 100 MG/5ML IV SOSY
PREFILLED_SYRINGE | INTRAVENOUS | Status: DC | PRN
  Administered 2020-04-14: 100 mg via INTRAVENOUS

## 2020-04-14 MED ORDER — PROPOFOL 10 MG/ML IV BOLUS
INTRAVENOUS | Status: DC | PRN
  Administered 2020-04-14: 70 mg via INTRAVENOUS

## 2020-04-14 MED ORDER — SODIUM CHLORIDE 0.9 % IV SOLN: INTRAVENOUS | Status: DC

## 2020-04-14 MED ORDER — GLYCOPYRROLATE 0.2 MG/ML IJ SOLN
INTRAMUSCULAR | Status: DC | PRN
  Administered 2020-04-14: .2 mg via INTRAVENOUS

## 2020-04-14 MED ORDER — PROPOFOL 500 MG/50ML IV EMUL
INTRAVENOUS | Status: DC | PRN
  Administered 2020-04-14: 140 ug/kg/min via INTRAVENOUS

## 2020-04-14 NOTE — Anesthesia Preprocedure Evaluation (Signed)
Anesthesia Evaluation  Patient identified by MRN, date of birth, ID band Patient awake    Reviewed: Allergy & Precautions, H&P , NPO status , Patient's Chart, lab work & pertinent test results, reviewed documented beta blocker date and time   Airway Mallampati: II   Neck ROM: full    Dental  (+) Poor Dentition   Pulmonary COPD,    Pulmonary exam normal        Cardiovascular Exercise Tolerance: Poor hypertension, On Medications Normal cardiovascular exam+ Valvular Problems/Murmurs  Rhythm:regular Rate:Normal     Neuro/Psych negative neurological ROS  negative psych ROS   GI/Hepatic Neg liver ROS, GERD  Medicated,  Endo/Other  negative endocrine ROS  Renal/GU Renal disease  negative genitourinary   Musculoskeletal   Abdominal   Peds  Hematology  (+) Blood dyscrasia, anemia ,   Anesthesia Other Findings Past Medical History: 03/05/2017: Abdominal aortic atherosclerosis (HCC)     Comment:  CT scan April 2018 No date: Arthritis No date: Bleeding disorder ([place]) 2015: Diverticulitis No date: GERD (gastroesophageal reflux disease) No date: History of kidney stones No date: History of stomach ulcers No date: Hyperlipidemia No date: Hypertension No date: Osteoporosis No date: Pre-diabetes 10/15/2015: Prediabetes 03/07/2017: Spondylolisthesis at L4-L5 level Past Surgical History: No date: ABDOMINAL HYSTERECTOMY 2010: BACK SURGERY 1990's: BREAST CYST ASPIRATION; Right 11/09/2017: COLONOSCOPY; Left     Comment:  Procedure: COLONOSCOPY;  Surgeon: Virgel Manifold,               MD;  Location: [facility] ENDOSCOPY;  Service: Endoscopy;                Laterality: Left; 08/15/2017: CYSTO WITH HYDRODISTENSION; N/A     Comment:  Procedure: CYSTOSCOPY/HYDRODISTENSION;  Surgeon: Nickie Retort, MD;  Location: [facility] ORS;  Service: Urology;               Laterality: N/A; 11/09/2017: ESOPHAGOGASTRODUODENOSCOPY;  Left     Comment:  Procedure: ESOPHAGOGASTRODUODENOSCOPY (EGD);  Surgeon:               Virgel Manifold, MD;  Location: [facility] ENDOSCOPY;                Service: Endoscopy;  Laterality: Left; 02/22/2020: ESOPHAGOGASTRODUODENOSCOPY (EGD) WITH PROPOFOL; N/A     Comment:  Procedure: ESOPHAGOGASTRODUODENOSCOPY (EGD) WITH               PROPOFOL;  Surgeon: Lucilla Lame, MD;  Location: [facility]               ENDOSCOPY;  Service: Endoscopy;  Laterality: N/A; No date: FOOT SURGERY; Right BMI    Body Mass Index: 30.99 kg/m     Reproductive/Obstetrics negative OB ROS                             Anesthesia Physical Anesthesia Plan  ASA: III  Anesthesia Plan: General   Post-op Pain Management:    Induction:   PONV Risk Score and Plan:   Airway Management Planned:   Additional Equipment:   Intra-op Plan:   Post-operative Plan:   Informed Consent: I have reviewed the patients History and Physical, chart, labs and discussed the procedure including the risks, benefits and alternatives for the proposed anesthesia with the patient or authorized representative who has indicated his/her understanding and acceptance.     Dental Advisory Given  Plan Discussed with:  CRNA  Anesthesia Plan Comments:         Anesthesia Quick Evaluation

## 2020-04-14 NOTE — Op Note (Signed)
[facility] Gastroenterology Patient Name: Holly Green Procedure Date: 04/14/2020 10:22 AM MRN: [id number] Account #: [id number] Date of Birth: 1946-06-07 Admit Type: Outpatient Age: 74 Room: [facility] ENDO ROOM 4 Gender: Female Note Status: Finalized Procedure:             Colonoscopy Indications:           Screening for colorectal malignant neoplasm Providers:             Osman Calzadilla B. Bonna Gains MD, MD Referring MD:          Hubbard Hartshorn., FNP Medicines:             Monitored Anesthesia Care Complications:         No immediate complications. Procedure:             Pre-Anesthesia Assessment:                        - Prior to the procedure, a History and Physical was                         performed, and patient medications, allergies and                         sensitivities were reviewed. The patient's tolerance                         of previous anesthesia was reviewed.                        - The risks and benefits of the procedure and the                         sedation options and risks were discussed with the                         patient. All questions were answered and informed                         consent was obtained.                        - Patient identification and proposed procedure were                         verified prior to the procedure by the physician, the                         nurse, the anesthetist and the technician. The                         procedure was verified in the pre-procedure area in                         the procedure room in the endoscopy suite.                        - ASA Grade Assessment: II - A patient with mild  systemic disease.                        - After reviewing the risks and benefits, the patient                         was deemed in satisfactory condition to undergo the                         procedure.                        After obtaining informed consent, the colonoscope  was                         passed under direct vision. Throughout the procedure,                         the patient's blood pressure, pulse, and oxygen                         saturations were monitored continuously. The                         Colonoscope was introduced through the anus and                         advanced to the the cecum, identified by appendiceal                         orifice and ileocecal valve. The colonoscopy was                         performed with ease. The patient tolerated the                         procedure well. The quality of the bowel preparation                         was good. Findings:      The perianal and digital rectal examinations were normal.      Multiple, more in the sigmoid colon than the right colon, diverticula       were found in the entire colon.      The exam was otherwise without abnormality.      The rectum, sigmoid colon, descending colon, transverse colon, ascending       colon and cecum appeared normal.      The retroflexed view of the distal rectum and anal verge was normal and       showed no anal or rectal abnormalities. Impression:            - Diverticulosis in the entire examined colon.                        - The examination was otherwise normal.                        - The rectum, sigmoid colon, descending colon,  transverse colon, ascending colon and cecum are normal.                        - The distal rectum and anal verge are normal on                         retroflexion view.                        - No specimens collected. Recommendation:        - Discharge patient to home.                        - Resume previous diet.                        - Continue present medications.                        - Return to primary care physician as previously                         scheduled.                        - The findings and recommendations were discussed with                         the  patient.                        - The findings and recommendations were discussed with                         the patient's family.                        - High fiber diet. Procedure Code(s):     --- Professional ---                        [phone], Colonoscopy, flexible; diagnostic, including                         collection of specimen(s) by brushing or washing, when                         performed (separate procedure) Diagnosis Code(s):     --- Professional ---                        Z12.11, Encounter for screening for malignant neoplasm                         of colon CPT copyright 2019 American Medical Association. All rights reserved. The codes documented in this report are preliminary and upon coder review may  be revised to meet current compliance requirements.  [name], MD Margretta Sidle B. Bonna Gains MD, MD 04/14/2020 11:05:32 AM This report has been signed electronically. Number of Addenda: 0 Note Initiated On: 04/14/2020 10:22 AM Scope Withdrawal Time: 0 hours 15 minutes 42 seconds  Total Procedure Duration: 0 hours 22 minutes 49 seconds  [facility]

## 2020-04-14 NOTE — H&P (Signed)
[name], MD [address], [place], [place], [place], [address] [address], [place], [place], [place], [address] Phone: [phone]  Fax: [phone]  Primary Care Physician:  Hubbard Hartshorn, FNP   Pre-Procedure History & Physical: HPI:  Holly Green is a 74 y.o. female is here for a colonoscopy.   Past Medical History:  Diagnosis Date  . Abdominal aortic atherosclerosis ([place]) 03/05/2017   CT scan April 2018  . Arthritis   . Bleeding disorder ([place])   . Diverticulitis 2015  . GERD (gastroesophageal reflux disease)   . History of kidney stones   . History of stomach ulcers   . Hyperlipidemia   . Hypertension   . Osteoporosis   . Pre-diabetes   . Prediabetes 10/15/2015  . Spondylolisthesis at L4-L5 level 03/07/2017    Past Surgical History:  Procedure Laterality Date  . ABDOMINAL HYSTERECTOMY    . BACK SURGERY  2010  . BREAST CYST ASPIRATION Right 1990's  . COLONOSCOPY Left 11/09/2017   Procedure: COLONOSCOPY;  Surgeon: Virgel Manifold, MD;  Location: [facility] ENDOSCOPY;  Service: Endoscopy;  Laterality: Left;  . CYSTO WITH HYDRODISTENSION N/A 08/15/2017   Procedure: CYSTOSCOPY/HYDRODISTENSION;  Surgeon: Nickie Retort, MD;  Location: [facility] ORS;  Service: Urology;  Laterality: N/A;  . ESOPHAGOGASTRODUODENOSCOPY Left 11/09/2017   Procedure: ESOPHAGOGASTRODUODENOSCOPY (EGD);  Surgeon: Virgel Manifold, MD;  Location: [facility] ENDOSCOPY;  Service: Endoscopy;  Laterality: Left;  . ESOPHAGOGASTRODUODENOSCOPY (EGD) WITH PROPOFOL N/A 02/22/2020   Procedure: ESOPHAGOGASTRODUODENOSCOPY (EGD) WITH PROPOFOL;  Surgeon: Lucilla Lame, MD;  Location: [facility] ENDOSCOPY;  Service: Endoscopy;  Laterality: N/A;  . FOOT SURGERY Right     Prior to Admission medications   Medication Sig Start Date End Date Taking? Authorizing Provider  acetaminophen (TYLENOL) 650 MG CR tablet Take 650-1,300 mg by mouth every 8 (eight) hours as needed for pain.   Yes [provider]    alendronate (FOSAMAX) 70 MG tablet Take 1 tablet (70 mg total) by mouth every Sunday. Take with a full glass of water on an empty stomach. 02/20/20  Yes Sowles, Drue Stager, MD  bisoprolol-hydrochlorothiazide [facility]) 10-6.25 MG tablet Take 1 tablet by mouth daily. 10/26/19  Yes Hubbard Hartshorn, FNP  Calcium Carb-Cholecalciferol (CALCIUM-VITAMIN D) 600-400 MG-UNIT TABS Take 1 tablet by mouth daily.   Yes [provider]  hydrALAZINE (APRESOLINE) 10 MG tablet Take 1 tablet (10 mg total) by mouth 3 (three) times daily. Prn if bp above 150/90 02/15/20  Yes Sowles, Drue Stager, MD  loratadine (CLARITIN) 10 MG tablet Take 1 tablet (10 mg total) by mouth daily. 10/26/19  Yes Hubbard Hartshorn, FNP  losartan (COZAAR) 50 MG tablet Take 1 tablet (50 mg total) by mouth daily. 02/15/20  Yes Sowles, Drue Stager, MD  lovastatin (MEVACOR) 40 MG tablet Take 1 tablet (40 mg total) by mouth at bedtime. 02/15/20  Yes Sowles, Drue Stager, MD  polyethylene glycol (MIRALAX) 17 g packet Take 17 g by mouth daily. 01/18/20  Yes Virgel Manifold, MD    Allergies as of 03/22/2020 - Review Complete 03/22/2020  Allergen Reaction Noted  . Aspirin Other (See Comments) 08/08/2017  . Lisinopril Cough 06/14/2019    Family History  Problem Relation Age of Onset  . Hypertension Brother   . Heart disease Brother   . Heart failure Brother   . Hypertension Mother   . Stroke Mother   . Stroke Father   . Prostate cancer Father   . Aneurysm Sister   . Breast cancer Sister 64  .  Liver disease Maternal Aunt   . Heart failure Paternal Aunt   . Heart disease Paternal Uncle   . Heart attack Brother   . Heart disease Brother   . Breast cancer Maternal Aunt   . Heart disease Paternal Aunt   . Kidney cancer Neg Hx   . Bladder Cancer Neg Hx     Social History   Socioeconomic History  . Marital status: Single    Spouse name: Not on file  . Number of children: 1  . Years of education: Not on file  . Highest education level: High school  graduate  Occupational History  . Occupation: retired  Tobacco Use  . Smoking status: Never Smoker  . Smokeless tobacco: Never Used  Substance and Sexual Activity  . Alcohol use: No    Alcohol/week: 0.0 standard drinks  . Drug use: No  . Sexual activity: Never  Other Topics Concern  . Not on file  Social History Narrative  . Not on file   Social Determinants of Health   Financial Resource Strain: Low Risk   . Difficulty of Paying Living Expenses: Not hard at all  Food Insecurity: No Food Insecurity  . Worried About Charity fundraiser in the Last Year: Never true  . Ran Out of Food in the Last Year: Never true  Transportation Needs: No Transportation Needs  . Lack of Transportation (Medical): No  . Lack of Transportation (Non-Medical): No  Physical Activity: Inactive  . Days of Exercise per Week: 0 days  . Minutes of Exercise per Session: 0 min  Stress: No Stress Concern Present  . Feeling of Stress : Not at all  Social Connections: Slightly Isolated  . Frequency of Communication with Friends and Family: More than three times a week  . Frequency of Social Gatherings with Friends and Family: Once a week  . Attends Religious Services: More than 4 times per year  . Active Member of Clubs or Organizations: Yes  . Attends Archivist Meetings: Never  . Marital Status: Never married  Intimate Partner Violence: Not At Risk  . Fear of Current or Ex-Partner: No  . Emotionally Abused: No  . Physically Abused: No  . Sexually Abused: No    Review of Systems: See HPI, otherwise negative ROS  Physical Exam: BP (!) 167/97   Pulse 76   Temp (!) 96.2 F (35.7 C) (Temporal)   Resp 16   Ht 5\' 6"  (1.676 m)   Wt 87.1 kg   LMP  (LMP Unknown)   SpO2 100%   BMI 30.99 kg/m  General:   Alert,  pleasant and cooperative in NAD Head:  Normocephalic and atraumatic. Neck:  Supple; no masses or thyromegaly. Lungs:  Clear throughout to auscultation, normal respiratory effort.     Heart:  +S1, +S2, Regular rate and rhythm, No edema. Abdomen:  Soft, nontender and nondistended. Normal bowel sounds, without guarding, and without rebound.   Neurologic:  Alert and  oriented x4;  grossly normal neurologically.  Impression/Plan: ZHOE TREASE is here for a colonoscopy to be performed for average risk screening.  Risks, benefits, limitations, and alternatives regarding  colonoscopy have been reviewed with the patient.  Questions have been answered.  All parties agreeable.   Virgel Manifold, MD  04/14/2020, 10:20 AM

## 2020-04-14 NOTE — Anesthesia Postprocedure Evaluation (Signed)
Anesthesia Post Note  Patient: Holly Green  Procedure(s) Performed: COLONOSCOPY WITH PROPOFOL (N/A )  Patient location during evaluation: PACU Anesthesia Type: General Level of consciousness: awake and alert Pain management: pain level controlled Vital Signs Assessment: post-procedure vital signs reviewed and stable Respiratory status: spontaneous breathing, nonlabored ventilation, respiratory function stable and patient connected to nasal cannula oxygen Cardiovascular status: blood pressure returned to baseline and stable Postop Assessment: no apparent nausea or vomiting Anesthetic complications: no     Last Vitals:  Vitals:   04/14/20 1106 04/14/20 1116  BP: 98/62 127/79  Pulse: 78   Resp: 17   Temp: (!) 36.2 C   SpO2: 98%     Last Pain:  Vitals:   04/14/20 1126  TempSrc:   PainSc: 7                  Molli Barrows

## 2020-04-14 NOTE — Transfer of Care (Signed)
Immediate Anesthesia Transfer of Care Note  Patient: Holly Green  Procedure(s) Performed: COLONOSCOPY WITH PROPOFOL (N/A )  Patient Location: PACU  Anesthesia Type:General  Level of Consciousness: sedated  Airway & Oxygen Therapy: Patient Spontanous Breathing  Post-op Assessment: Report given to RN and Post -op Vital signs reviewed and stable  Post vital signs: Reviewed and stable  Last Vitals:  Vitals Value Taken Time  BP 98/62 04/14/20 1105  Temp    Pulse 75 04/14/20 1106  Resp 17 04/14/20 1106  SpO2 98 % 04/14/20 1106  Vitals shown include unvalidated device data.  Last Pain:  Vitals:   04/14/20 0952  TempSrc: Temporal  PainSc: 7          Complications: No apparent anesthesia complications

## 2020-04-17 ENCOUNTER — Encounter: Payer: Self-pay | Admitting: *Deleted

## 2020-05-01 ENCOUNTER — Telehealth: Payer: Medicare Other

## 2020-05-01 ENCOUNTER — Other Ambulatory Visit: Payer: Self-pay | Admitting: Family Medicine

## 2020-05-01 ENCOUNTER — Ambulatory Visit: Payer: Self-pay

## 2020-05-01 DIAGNOSIS — I700 Atherosclerosis of aorta: Secondary | ICD-10-CM

## 2020-05-01 DIAGNOSIS — I10 Essential (primary) hypertension: Secondary | ICD-10-CM

## 2020-05-01 DIAGNOSIS — M810 Age-related osteoporosis without current pathological fracture: Secondary | ICD-10-CM

## 2020-05-01 MED ORDER — ALENDRONATE SODIUM 70 MG PO TABS: 70.0000 mg | ORAL_TABLET | ORAL | 0 refills | Status: DC

## 2020-05-01 MED ORDER — BISOPROLOL-HYDROCHLOROTHIAZIDE 10-6.25 MG PO TABS: 1.0000 | ORAL_TABLET | Freq: Every day | ORAL | 1 refills | Status: DC

## 2020-05-01 NOTE — Telephone Encounter (Signed)
Requested Prescriptions  Pending Prescriptions Disp Refills  . bisoprolol-hydrochlorothiazide (ZIAC) 10-6.25 MG tablet 90 tablet 1    Sig: Take 1 tablet by mouth daily.     Cardiovascular: Beta Blocker + Diuretic Combos Passed - 05/01/2020  9:07 AM      Passed - K in normal range and within 180 days    Potassium  Date Value Ref Range Status  03/28/2020 3.7 3.5 - 5.1 mmol/L Final  01/16/2014 3.5 3.5 - 5.1 mmol/L Final         Passed - Na in normal range and within 180 days    Sodium  Date Value Ref Range Status  03/28/2020 137 135 - 145 mmol/L Final  10/15/2019 141 134 - 144 mmol/L Final  01/16/2014 141 136 - 145 mmol/L Final         Passed - Cr in normal range and within 180 days    Creat  Date Value Ref Range Status  02/08/2019 0.65 0.60 - 0.93 mg/dL Final    Comment:    For patients >36 years of age, the reference limit for Creatinine is approximately 13% higher for people identified as African-American. .    Creatinine, Ser  Date Value Ref Range Status  03/28/2020 0.58 0.44 - 1.00 mg/dL Final         Passed - Ca in normal range and within 180 days    Calcium  Date Value Ref Range Status  03/28/2020 9.3 8.9 - 10.3 mg/dL Final   Calcium, Total  Date Value Ref Range Status  01/16/2014 7.6 (L) 8.5 - 10.1 mg/dL Final         Passed - Patient is not pregnant      Passed - Last BP in normal range    BP Readings from Last 1 Encounters:  04/14/20 127/79         Passed - Last Heart Rate in normal range    Pulse Readings from Last 1 Encounters:  04/14/20 78         Passed - Valid encounter within last 6 months    Recent Outpatient Visits          2 months ago Essential hypertension   [facility] Steele Sizer, MD   3 months ago Hospital discharge follow-up   [facility] Steele Sizer, MD   6 months ago Essential hypertension   [place], [place], FNP   10 months ago Essential  hypertension   [place], [place], NP   1 year ago Essential hypertension   [place], Astrid Divine, [place]      Future Appointments            In 1 month Delsa Grana, PA-C [facility], [place]   In 1 month Tahiliani, Lennette Bihari, MD [place]   In 9 months  [facility], [place]           . alendronate (FOSAMAX) 70 MG tablet 12 tablet 0    Sig: Take 1 tablet (70 mg total) by mouth every Sunday. Take with a full glass of water on an empty stomach.     Endocrinology:  Bisphosphonates Passed - 05/01/2020  9:07 AM      Passed - Ca in normal range and within 360 days    Calcium  Date Value Ref Range Status  03/28/2020 9.3 8.9 - 10.3 mg/dL Final   Calcium, Total  Date Value Ref Range Status  01/16/2014 7.6 (L) 8.5 - 10.1 mg/dL Final         Passed - Vitamin D in normal range and within 360 days    Vit D, 25-Hydroxy  Date Value Ref Range Status  10/15/2019 43.8 30.0 - 100.0 ng/mL Final    Comment:    Vitamin D deficiency has been defined by the [place] practice guideline as a level of serum 25-OH vitamin D less than 20 ng/mL (1,2). The Endocrine Society went on to further define vitamin D insufficiency as a level between 21 and 29 ng/mL (2). 1. IOM (Institute of Medicine). 2010. Dietary reference    intakes for calcium and D. [place]: The    [employer]. 2. Holick MF, Binkley [place], Bischoff-Ferrari HA, et al.    Evaluation, treatment, and prevention of vitamin D    deficiency: an Endocrine Society clinical practice    guideline. JCEM. 2011 Jul; 96(7):1911-30.          Passed - Valid encounter within last 12 months    Recent Outpatient Visits          2 months ago Essential hypertension   [facility] Steele Sizer, MD   3 months ago Hospital discharge follow-up   [facility]  Steele Sizer, MD   6 months ago Essential hypertension   [place], [place], FNP   10 months ago Essential hypertension   [place], [place], NP   1 year ago Essential hypertension   [place], Astrid Divine, [place]      Future Appointments            In 1 month Delsa Grana, PA-C [facility], [place]   In 1 month Tahiliani, Lennette Bihari, MD [place]   In 9 months  [facility], [facility]

## 2020-05-01 NOTE — Chronic Care Management (AMB) (Signed)
  Chronic Care Management   Outreach Note  05/01/2020 Name: Holly Green MRN: [id number] DOB: [birth year]  Primary Care Provider: Hubbard Hartshorn, FNP Reason for referral : Chronic Care Management   An unsuccessful telephone outreach was attempted today. Holly Green was referred to the case management team for assistance with care management and care coordination.   Phone rang multiple times without option to leave a voice message.   Follow Up Plan: The care management team will reach out again within the next two to three weeks.   [facility] Care Management [phone]

## 2020-05-01 NOTE — Telephone Encounter (Signed)
Medication Refill - Medication:  alendronate (FOSAMAX) 70 MG tablet  bisoprolol-hydrochlorothiazide (ZIAC) 10-6.25 MG tablet    Has the patient contacted their pharmacy? Yes advised to call.   Preferred Pharmacy (with phone number or street name):  [facility] ([facility] [place] [facility] [place] [place] [address]  Phone:  [phone] Fax:  [phone]   Agent: Please be advised that RX refills may take up to 3 business days. We ask that you follow-up with your pharmacy.

## 2020-05-15 ENCOUNTER — Telehealth: Payer: Medicare Other

## 2020-05-15 ENCOUNTER — Ambulatory Visit: Payer: Self-pay

## 2020-05-24 NOTE — Chronic Care Management (AMB) (Signed)
This note was created in error. Please disregard.

## 2020-06-09 DIAGNOSIS — M0609 Rheumatoid arthritis without rheumatoid factor, multiple sites: Secondary | ICD-10-CM | POA: Insufficient documentation

## 2020-06-14 ENCOUNTER — Other Ambulatory Visit: Payer: Self-pay

## 2020-06-14 ENCOUNTER — Ambulatory Visit (INDEPENDENT_AMBULATORY_CARE_PROVIDER_SITE_OTHER): Payer: Medicare Other | Admitting: Family Medicine

## 2020-06-14 ENCOUNTER — Encounter: Payer: Self-pay | Admitting: Family Medicine

## 2020-06-14 VITALS — BP 132/78 | HR 70 | Temp 97.9°F | Resp 16 | Ht 66.0 in | Wt 198.4 lb

## 2020-06-14 DIAGNOSIS — Z5181 Encounter for therapeutic drug level monitoring: Secondary | ICD-10-CM

## 2020-06-14 DIAGNOSIS — M810 Age-related osteoporosis without current pathological fracture: Secondary | ICD-10-CM

## 2020-06-14 DIAGNOSIS — E782 Mixed hyperlipidemia: Secondary | ICD-10-CM | POA: Diagnosis not present

## 2020-06-14 DIAGNOSIS — I10 Essential (primary) hypertension: Secondary | ICD-10-CM | POA: Diagnosis not present

## 2020-06-14 DIAGNOSIS — Z1231 Encounter for screening mammogram for malignant neoplasm of breast: Secondary | ICD-10-CM | POA: Diagnosis not present

## 2020-06-14 DIAGNOSIS — I700 Atherosclerosis of aorta: Secondary | ICD-10-CM

## 2020-06-14 MED ORDER — LOVASTATIN 40 MG PO TABS: 40.0000 mg | ORAL_TABLET | Freq: Every day | ORAL | 3 refills | Status: DC

## 2020-06-14 MED ORDER — BISOPROLOL-HYDROCHLOROTHIAZIDE 10-6.25 MG PO TABS: 1.0000 | ORAL_TABLET | Freq: Every day | ORAL | 3 refills | Status: DC

## 2020-06-14 MED ORDER — LOSARTAN POTASSIUM 50 MG PO TABS: 50.0000 mg | ORAL_TABLET | Freq: Every day | ORAL | 3 refills | Status: DC

## 2020-06-14 NOTE — Progress Notes (Signed)
Name: Holly Green   MRN: [id number]    DOB: February 08, 1946   Date:06/14/2020       Progress Note  Chief Complaint  Patient presents with  . Hypertension    follow up  . Hyperlipidemia     Subjective:   Holly Green is a 74 y.o. female, presents to clinic for routine f/up pt new to me  Hypertension:  Currently managed on hydralazine 10 mg TID PRN (pt not taking) Losartan 50 mg daily, bisoprolol-HCTZ 10-6.25 Pt reports good med compliance and denies any SE.  No lightheadedness, hypotension, syncope. Blood pressure today is well controlled. BP Readings from Last 3 Encounters:  06/14/20 132/78  04/14/20 127/79  03/28/20 (!) 167/91  She monitors at home, runs 117/70 -130's/80 Sometimes higher when she is in pain.  Has not been taking hydralazine for a while and BP at goal. Pt denies CP, SOB, exertional sx, LE edema, palpitation, Ha's, visual disturbances   Hyperlipidemia: Currently treated with lovastatin 40 mg, pt reports good med compliance Last Lipids: Lab Results  Component Value Date   CHOL 156 10/15/2019   HDL 79 10/15/2019   LDLCALC 67 10/15/2019   TRIG 43 10/15/2019   CHOLHDL 2.0 10/15/2019   - Denies: Chest pain, shortness of breath, myalgias, claudication  Osteoporosis - on fosamax Can see in chart that she has been prescribed since 2016, last bone density 07/2018 On steroids daily She does not believe she has taken the meds all 5 years, she remembers Dr. Sanda Green restarted it? Supplementing with Vit D 600 IU daily     Current Outpatient Medications:  .  acetaminophen (TYLENOL) 650 MG CR tablet, Take 650-1,300 mg by mouth every 8 (eight) hours as needed for pain., Disp: , Rfl:  .  alendronate (FOSAMAX) 70 MG tablet, Take 1 tablet (70 mg total) by mouth every Sunday. Take with a full glass of water on an empty stomach., Disp: 12 tablet, Rfl: 0 .  bisoprolol-hydrochlorothiazide (ZIAC) 10-6.25 MG tablet, Take 1 tablet by mouth daily., Disp: 90 tablet, Rfl: 1 .   Calcium Carb-Cholecalciferol (CALCIUM-VITAMIN D) 600-400 MG-UNIT TABS, Take 1 tablet by mouth daily., Disp: , Rfl:  .  hydrALAZINE (APRESOLINE) 10 MG tablet, Take 1 tablet (10 mg total) by mouth 3 (three) times daily. Prn if bp above 150/90, Disp: 60 tablet, Rfl: 0 .  loratadine (CLARITIN) 10 MG tablet, Take 1 tablet (10 mg total) by mouth daily., Disp: 30 tablet, Rfl: 11 .  losartan (COZAAR) 50 MG tablet, Take 1 tablet (50 mg total) by mouth daily., Disp: 90 tablet, Rfl: 1 .  lovastatin (MEVACOR) 40 MG tablet, Take 1 tablet (40 mg total) by mouth at bedtime., Disp: 90 tablet, Rfl: 1 .  polyethylene glycol (MIRALAX) 17 g packet, Take 17 g by mouth daily., Disp: 30 each, Rfl: 2 .  predniSONE (DELTASONE) 5 MG tablet, Take by mouth., Disp: , Rfl:   Patient Active Problem List   Diagnosis Date Noted  . Special screening for malignant neoplasms, colon   . Abnormal CT scan, gastrointestinal tract   . Gastrointestinal hemorrhage 12/29/2019  . Left hip pain 10/15/2019  . Obesity (BMI 30.0-34.9) 08/10/2018  . Emphysema of lung ([place]) 08/10/2018  . Allergic rhinitis 08/10/2018  . COAD (chronic obstructive airways disease) ([place]) 08/03/2018  . Primary osteoarthritis of right knee 07/17/2018  . Chondrocalcinosis 07/17/2018  . 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 Anemia 12/17/2017  . Diverticulosis of large intestine without diverticulitis   .  History of GI bleed 11/06/2017  . Left flank pain, chronic 03/07/2017  . Spondylolisthesis at L4-L5 level 03/07/2017  . Abdominal aortic atherosclerosis ([place]) 03/05/2017  . Simple cyst of kidney 03/05/2017  . Benign liver cyst 03/05/2017  . Medication monitoring encounter 11/22/2016  . GERD (gastroesophageal reflux disease) 08/07/2016  . Osteoporosis 05/07/2016  . Cardiac murmur 05/07/2016  . Prediabetes 10/15/2015  . Hyperlipidemia 10/15/2015  . Essential hypertension 10/15/2015  . Diverticulosis of colon, acquired  10/15/2015    Past Surgical History:  Procedure Laterality Date  . ABDOMINAL HYSTERECTOMY    . BACK SURGERY  2010  . BREAST CYST ASPIRATION Right 1990's  . COLONOSCOPY Left 11/09/2017   Procedure: COLONOSCOPY;  Surgeon: Virgel Manifold, MD;  Location: [facility] ENDOSCOPY;  Service: Endoscopy;  Laterality: Left;  . COLONOSCOPY WITH PROPOFOL N/A 04/14/2020   Procedure: COLONOSCOPY WITH PROPOFOL;  Surgeon: Virgel Manifold, MD;  Location: [facility] ENDOSCOPY;  Service: Endoscopy;  Laterality: N/A;  . CYSTO WITH HYDRODISTENSION N/A 08/15/2017   Procedure: CYSTOSCOPY/HYDRODISTENSION;  Surgeon: Nickie Retort, MD;  Location: [facility] ORS;  Service: Urology;  Laterality: N/A;  . ESOPHAGOGASTRODUODENOSCOPY Left 11/09/2017   Procedure: ESOPHAGOGASTRODUODENOSCOPY (EGD);  Surgeon: Virgel Manifold, MD;  Location: [facility] ENDOSCOPY;  Service: Endoscopy;  Laterality: Left;  . ESOPHAGOGASTRODUODENOSCOPY (EGD) WITH PROPOFOL N/A 02/22/2020   Procedure: ESOPHAGOGASTRODUODENOSCOPY (EGD) WITH PROPOFOL;  Surgeon: Lucilla Lame, MD;  Location: [facility] ENDOSCOPY;  Service: Endoscopy;  Laterality: N/A;  . FOOT SURGERY Right     Family History  Problem Relation Age of Onset  . Hypertension Brother   . Heart disease Brother   . Heart failure Brother   . Hypertension Mother   . Stroke Mother   . Stroke Father   . Prostate cancer Father   . Aneurysm Sister   . Breast cancer Sister 55  . Liver disease Maternal Aunt   . Heart failure Paternal Aunt   . Heart disease Paternal Uncle   . Heart attack Brother   . Heart disease Brother   . Breast cancer Maternal Aunt   . Heart disease Paternal Aunt   . Kidney cancer Neg Hx   . Bladder Cancer Neg Hx     Social History   Tobacco Use  . Smoking status: Never Smoker  . Smokeless tobacco: Never Used  Vaping Use  . Vaping Use: Never used  Substance Use Topics  . Alcohol use: No    Alcohol/week: 0.0 standard drinks  . Drug use: No     Allergies  Allergen  Reactions  . Aspirin Other (See Comments)    Diverticulosis  . Lisinopril Cough    Health Maintenance  Topic Date Due  . INFLUENZA VACCINE  06/25/2020  . MAMMOGRAM  07/01/2020  . TETANUS/TDAP  06/15/2022  . COLONOSCOPY  04/14/2025  . DEXA SCAN  Completed  . COVID-19 Vaccine  Completed  . Hepatitis C Screening  Completed  . PNA vac Low Risk Adult  Completed    Chart Review Today: I personally reviewed active problem list, medication list, allergies, family history, social history, health maintenance, notes from last encounter, lab results, imaging with the patient/caregiver today.   Review of Systems  10 Systems reviewed and are negative for acute change except as noted in the HPI.  Objective:   Vitals:   06/14/20 1329  BP: 132/78  Pulse: 70  Resp: 16  Temp: 97.9 F (36.6 C)  TempSrc: Temporal  SpO2: 98%  Weight: 198 lb 6.4 oz (90 kg)  Height:  5\' 6"  (1.676 m)    Body mass index is 32.02 kg/m.  Physical Exam Vitals and nursing note reviewed.  Constitutional:      General: She is not in acute distress.    Appearance: Normal appearance. She is well-developed. She is obese. She is not ill-appearing, toxic-appearing or diaphoretic.     Interventions: Face mask in place.  HENT:     Head: Normocephalic and atraumatic.     Right Ear: External ear normal.     Left Ear: External ear normal.  Eyes:     General: Lids are normal. No scleral icterus.       Right eye: No discharge.        Left eye: No discharge.     Conjunctiva/sclera: Conjunctivae normal.  Neck:     Trachea: Phonation normal. No tracheal deviation.  Cardiovascular:     Rate and Rhythm: Normal rate and regular rhythm.     Pulses: Normal pulses.          Radial pulses are 2+ on the right side and 2+ on the left side.       Posterior tibial pulses are 2+ on the right side and 2+ on the left side.     Heart sounds: Normal heart sounds. No murmur heard.  No friction rub. No gallop.   Pulmonary:      Effort: Pulmonary effort is normal. No respiratory distress.     Breath sounds: Normal breath sounds. No stridor. No wheezing, rhonchi or rales.  Chest:     Chest wall: No tenderness.  Abdominal:     General: Bowel sounds are normal. There is no distension.     Palpations: Abdomen is soft.     Tenderness: There is no abdominal tenderness. There is no guarding or rebound.  Musculoskeletal:        General: No deformity. Normal range of motion.     Cervical back: Normal range of motion and neck supple.     Right lower leg: No edema.     Left lower leg: No edema.  Lymphadenopathy:     Cervical: No cervical adenopathy.  Skin:    General: Skin is warm and dry.     Coloration: Skin is not jaundiced or pale.     Findings: No rash.  Neurological:     Mental Status: She is alert.     Motor: No abnormal muscle tone.     Gait: Gait normal.  Psychiatric:        Mood and Affect: Mood normal.        Speech: Speech normal.        Behavior: Behavior normal.         Assessment & Plan:     ICD-10-CM   1. Essential hypertension  Z02 COMPLETE METABOLIC PANEL WITH GFR    losartan (COZAAR) 50 MG tablet    bisoprolol-hydrochlorothiazide (ZIAC) 10-6.25 MG tablet   well controlled w/o hydralazine right now, continue meds, labs today, f/up in 6 months, continue home monitoring if having sx  2. Mixed hyperlipidemia  E78.2 Lipid panel    COMPLETE METABOLIC PANEL WITH GFR    lovastatin (MEVACOR) 40 MG tablet   compliant with statin, no SE or myalgias reported, last lipid panel 09/2019, will do lipid panel today and if con't well control - repeat in 1 yr  3. Age-related osteoporosis without current pathological fracture  M81.0 DG Bone Density    VITAMIN D 25 Hydroxy (Vit-D Deficiency, Fractures)  COMPLETE METABOLIC PANEL WITH GFR   alendronate x 5 years?  will review chart and consult with rheumatology regarding continuing or d/c of meds, no SE, increase Vit D and calcium supplement dose  4.  Encounter for screening mammogram for malignant neoplasm of breast  Z12.31 MM 3D SCREEN BREAST BILATERAL  5. Abdominal aortic atherosclerosis (HCC)  I70.0 bisoprolol-hydrochlorothiazide (ZIAC) 10-6.25 MG tablet    lovastatin (MEVACOR) 40 MG tablet  6. Encounter for medication monitoring  Z51.81 Lipid panel    DG Bone Density    MM 3D SCREEN BREAST BILATERAL    VITAMIN D 25 Hydroxy (Vit-D Deficiency, Fractures)    COMPLETE METABOLIC PANEL WITH GFR     Return in about 6 months (around 12/15/2020) for Routine follow-up.   Delsa Grana, PA-C 06/14/20 1:35 PM

## 2020-06-14 NOTE — Patient Instructions (Addendum)
[facility] at [place],  [place]  [address] Get [address]riving Directions Main: [phone]  Call and schedule your mammogram and repeat bone density in September   Health Maintenance  Topic Date Due  . INFLUENZA VACCINE  06/25/2020  . MAMMOGRAM  07/01/2020  . DEXA SCAN  08/19/2020  . TETANUS/TDAP  06/15/2022  . COLONOSCOPY  04/14/2025  . COVID-19 Vaccine  Completed  . Hepatitis C Screening  Completed  . PNA vac Low Risk Adult  Completed    Preventing Osteoporosis, Adult Osteoporosis is a condition that causes the bones to lose density. This means that the bones become thinner, and the normal spaces in bone tissue become larger. Low bone density can make the bones weak and cause them to break more easily. Osteoporosis cannot always be prevented, but you can take steps to lower your risk of developing this condition. How can this condition affect me? If you develop osteoporosis, you will be more likely to break bones in your wrist, spine, or hip. Even a minor accident or injury can be enough to break weak bones. The bones will also be slower to heal. Osteoporosis can cause other problems as well, such as a stooped posture or trouble with movement. Osteoporosis can occur with aging. As you get older, you may lose bone tissue more quickly, or it may be replaced more slowly. Osteoporosis is more likely to develop if you have poor nutrition or do not get enough calcium or vitamin D. Other lifestyle factors can also play a role. By eating a well-balanced diet and making lifestyle changes, you can help keep your bones strong and healthy, lowering your chances of developing osteoporosis. What can increase my risk? The following factors may make you more likely to develop osteoporosis:  Having a family history of the condition.  Having poor nutrition or not getting enough calcium or vitamin D.  Using certain medicines, such as steroid medicines or  antiseizure medicines.  Being any of the following: ? 70 years of age or older. ? Female. ? A woman who has gone through menopause (is postmenopausal). ? White (Caucasian) or of Asian descent.  Smoking or having a history of smoking.  Not being physically active (being sedentary).  Having a small body frame. What actions can I take to prevent this?  Get enough calcium   Make sure you get enough calcium every day. Calcium is the most important mineral for bone health. Most people can get enough calcium from their diet, but supplements may be recommended for people who are at risk for osteoporosis. Follow these guidelines: ? If you are age 3 or younger, aim to get 1,000 mg of calcium every day. ? If you are older than age 57, aim to get 1,200 mg of calcium every day.  Good sources of calcium include: ? Dairy products, such as low-fat or nonfat milk, cheese, and yogurt. ? Dark green leafy vegetables, such as bok choy and broccoli. ? Foods that have had calcium added to them (calcium-fortified foods), such as orange juice, cereal, bread, soy beverages, and tofu products. ? Nuts, such as almonds.  Check nutrition labels to see how much calcium is in a food or drink. Get enough vitamin D  Try to get enough vitamin D every day. Vitamin D is the most essential vitamin for bone health. It helps the body absorb calcium. Follow these guidelines for how much vitamin D to get from food: ? If you are age  27 or younger, aim to get at least 600 international units (IU) every day. Your health care provider may suggest more. ? If you are older than age 38, aim to get at least 800 international units every day. Your health care provider may suggest more.  Good sources of vitamin D in your diet include: ? Egg yolks. ? Oily fish, such as salmon, sardines, and tuna. ? Milk and cereal fortified with vitamin D.  Your body also makes vitamin D when you are out in the sun. Exposing the bare skin on  your face, arms, legs, or back to the sun for no more than 30 minutes a day, 2 times a week is more than enough. Beyond that, make sure you use sunblock to protect your skin from sunburn, which increases your risk for skin cancer. Exercise  Stay active and get exercise every day.  Ask your health care provider what types of exercise are best for you. Weight-bearing and strength-building activities are important for building and maintaining healthy bones. Some examples of these types of activities include: ? Walking and hiking. ? Jogging and running. ? Dancing. ? Gym exercises. ? Lifting weights. ? Tennis and racquetball. ? Climbing stairs. ? Aerobics. Make other lifestyle changes  Do not use any products that contain nicotine or tobacco, such as cigarettes, e-cigarettes, and chewing tobacco. If you need help quitting, ask your health care provider.  Lose weight if you are overweight.  If you drink alcohol: ? Limit how much you use to:  0-1 drink a day for nonpregnant women.  0-2 drinks a day for men. ? Be aware of how much alcohol is in your drink. In the [place] one drink equals one 12 oz bottle of beer (355 mL), one 5 oz glass of wine (148 mL), or one 1 oz glass of hard liquor (44 mL). Where to find support If you need help making changes to prevent osteoporosis, talk with your health care provider. You can ask for a referral to a diet and nutrition specialist (dietitian) and a physical therapist. Where to find more information Learn more about osteoporosis from:  NIH Osteoporosis and Related [place]: www.bones.SouthExposed.es  [place].S. Office on [employer] Health: [place]s.tn  [place]: EquipmentWeekly.com.ee Summary  Osteoporosis is a condition that causes weak bones that are more likely to break.  Eat a healthy diet, making sure you get enough calcium and vitamin D, and stay active by getting regular exercise to help prevent  osteoporosis.  Other ways to reduce your risk of osteoporosis include maintaining a healthy weight and avoiding alcohol and products that contain nicotine or tobacco. This information is not intended to replace advice given to you by your health care provider. Make sure you discuss any questions you have with your health care provider. Document Revised: 06/11/2019 Document Reviewed: 06/11/2019 Elsevier Patient Education  [place].

## 2020-06-15 LAB — COMPLETE METABOLIC PANEL WITH GFR
AG Ratio: 1.7 (calc) (ref 1.0–2.5)
ALT: 33 U/L — ABNORMAL HIGH (ref 6–29)
AST: 21 U/L (ref 10–35)
Albumin: 4 g/dL (ref 3.6–5.1)
Alkaline phosphatase (APISO): 79 U/L (ref 37–153)
BUN: 21 mg/dL (ref 7–25)
CO2: 29 mmol/L (ref 20–32)
Calcium: 9.1 mg/dL (ref 8.6–10.4)
Chloride: 101 mmol/L (ref 98–110)
Creat: 0.67 mg/dL (ref 0.60–0.93)
GFR, Est African American: 102 mL/min/{1.73_m2} (ref >=60)
GFR, Est Non African American: 88 mL/min/{1.73_m2} (ref >=60)
Globulin: 2.4 g/dL (calc) (ref 1.9–3.7)
Glucose, Bld: 114 mg/dL — ABNORMAL HIGH (ref 65–99)
Potassium: 3.9 mmol/L (ref 3.5–5.3)
Sodium: 140 mmol/L (ref 135–146)
Total Bilirubin: 1.2 mg/dL (ref 0.2–1.2)
Total Protein: 6.4 g/dL (ref 6.1–8.1)

## 2020-06-15 LAB — LIPID PANEL
Cholesterol: 178 mg/dL (ref <200)
HDL: 82 mg/dL (ref >=50)
LDL Cholesterol (Calc): 84 mg/dL (calc)
Non-HDL Cholesterol (Calc): 96 mg/dL (calc) (ref <130)
Total CHOL/HDL Ratio: 2.2 (calc) (ref <5.0)
Triglycerides: 50 mg/dL (ref <150)

## 2020-06-15 LAB — VITAMIN D 25 HYDROXY (VIT D DEFICIENCY, FRACTURES): Vit D, 25-Hydroxy: 24 ng/mL — ABNORMAL LOW (ref 30–100)

## 2020-06-16 ENCOUNTER — Ambulatory Visit: Payer: Medicare Other

## 2020-06-16 ENCOUNTER — Ambulatory Visit: Payer: Medicare Other | Admitting: Family Medicine

## 2020-06-16 ENCOUNTER — Ambulatory Visit: Payer: Self-pay

## 2020-06-16 ENCOUNTER — Telehealth: Payer: Self-pay

## 2020-06-16 NOTE — Chronic Care Management (AMB) (Signed)
  Chronic Care Management   Outreach Note  06/16/2020 Name: Holly Green MRN: [id number] DOB: 03-29-1946  Primary Care Provider: Delsa Grana, PA-C Reason for referral : Chronic Care Management   An unsuccessful telephone outreach was attempted today. [name] was referred to the case management team for assistance with care management and care coordination.   A HIPAA compliant voice message was left requesting a return call.    PLAN The care management team will reach out to [name] again within the next two weeks.    [facility] Care Management [phone]

## 2020-06-21 ENCOUNTER — Other Ambulatory Visit: Payer: Self-pay

## 2020-06-21 ENCOUNTER — Ambulatory Visit (INDEPENDENT_AMBULATORY_CARE_PROVIDER_SITE_OTHER): Payer: Medicare Other | Admitting: Gastroenterology

## 2020-06-21 ENCOUNTER — Encounter: Payer: Self-pay | Admitting: Gastroenterology

## 2020-06-21 VITALS — BP 135/76 | HR 73 | Temp 98.1°F | Ht 66.0 in | Wt 195.4 lb

## 2020-06-21 DIAGNOSIS — K5900 Constipation, unspecified: Secondary | ICD-10-CM | POA: Diagnosis not present

## 2020-06-21 DIAGNOSIS — D509 Iron deficiency anemia, unspecified: Secondary | ICD-10-CM | POA: Diagnosis not present

## 2020-06-21 DIAGNOSIS — R109 Unspecified abdominal pain: Secondary | ICD-10-CM

## 2020-06-21 NOTE — Patient Instructions (Signed)

## 2020-06-22 NOTE — Progress Notes (Signed)
Holly Antigua, MD [address]  [place]  [place], [name] [address]  Main: [phone]  Fax: [phone]   Primary Care Physician: Holly Grana, PA-C   Chief Complaint  Patient presents with  . Constipation    HPI: Holly Green is a 74 y.o. female here for follow-up of constipation.  Patient taking MiraLAX daily and having 1 soft bowel movement daily.  States abdominal pain is better as well.  Recent colonoscopy in May 2021 showed diverticulosis and was otherwise normal.  Hemoglobin improved and normal at this time.  Previous history: She underwent EGD in March 2021 due to a CT showing gastric thickening.  Upper endoscopy was normal except for hiatal hernia   Patient has a history of diverticular bleeding, last hospitalized for it in February 2021.  2018 procedures during hospitalization: "She underwent an EGD that was normal on that admission, and a colonoscopy that showed diffuse diverticulosis, and the cecum, ascending colon, descending colon and sigmoid colon. Majority of the diverticulosis was in the left colon. None of these were actively bleeding and no intervention was needed during the colonoscopy.   Repeat recommended in 5 years.  She has history of diverticular bleeds in the past. In 2015 she had similar symptoms and and colonoscopy was planned but due to significant bleeding, RBC scan was done and was positive for hemorrhage in the right colon likely related to bleeding diverticulum. Patient underwent coil embolization at that time. Prior to this in 2009 she also had a positive RBC scan in the mid transverse and descending colon consistent with GI bleed.  In 2012 patient had an upper endoscopy which showed hiatal hernia, erosive gastropathy, colonoscopy was done for a lot of abdominal pain at the time and showed solitary ulcer in the descending colon."  Current Outpatient Medications  Medication Sig Dispense Refill  . acetaminophen (TYLENOL)  650 MG CR tablet Take 650-1,300 mg by mouth every 8 (eight) hours as needed for pain.    Marland Kitchen alendronate (FOSAMAX) 70 MG tablet Take 1 tablet (70 mg total) by mouth every Sunday. Take with a full glass of water on an empty stomach. 12 tablet 0  . bisoprolol-hydrochlorothiazide (ZIAC) 10-6.25 MG tablet Take 1 tablet by mouth daily. 90 tablet 3  . Calcium Carb-Cholecalciferol (CALCIUM-VITAMIN D) 600-400 MG-UNIT TABS Take 1 tablet by mouth daily.    . hydrALAZINE (APRESOLINE) 10 MG tablet Take 1 tablet (10 mg total) by mouth 3 (three) times daily. Prn if bp above 150/90 60 tablet 0  . hydroxychloroquine (PLAQUENIL) 200 MG tablet Take 1 tablet by mouth in the morning and at bedtime.    Marland Kitchen loratadine (CLARITIN) 10 MG tablet Take 1 tablet (10 mg total) by mouth daily. 30 tablet 11  . losartan (COZAAR) 50 MG tablet Take 1 tablet (50 mg total) by mouth daily. 90 tablet 3  . lovastatin (MEVACOR) 40 MG tablet Take 1 tablet (40 mg total) by mouth at bedtime. 90 tablet 3  . polyethylene glycol (MIRALAX) 17 g packet Take 17 g by mouth daily. 30 each 2  . predniSONE (DELTASONE) 5 MG tablet Take by mouth.     No current facility-administered medications for this visit.    Allergies as of 06/21/2020 - Review Complete 06/21/2020  Allergen Reaction Noted  . Aspirin Other (See Comments) 08/08/2017  . Lisinopril Cough 06/14/2019    ROS:  General: Negative for anorexia, weight loss, fever, chills, fatigue, weakness. ENT: Negative for hoarseness, difficulty swallowing , nasal congestion.  CV: Negative for chest pain, angina, palpitations, dyspnea on exertion, peripheral edema.  Respiratory: Negative for dyspnea at rest, dyspnea on exertion, cough, sputum, wheezing.  GI: See history of present illness. GU:  Negative for dysuria, hematuria, urinary incontinence, urinary frequency, nocturnal urination.  Endo: Negative for unusual weight change.    Physical Examination:   BP (!) 135/76   Pulse 73   Temp 98.1 F  (36.7 C) (Oral)   Ht 5\' 6"  (1.676 m)   Wt 195 lb 6.4 oz (88.6 kg)   LMP  (LMP Unknown)   BMI 31.54 kg/m   General: Well-nourished, well-developed in no acute distress.  Eyes: No icterus. Conjunctivae pink. Mouth: Oropharyngeal mucosa moist and pink , no lesions erythema or exudate. Neck: Supple, Trachea midline Abdomen: Bowel sounds are normal, nontender, nondistended, no hepatosplenomegaly or masses, no abdominal bruits or hernia , no rebound or guarding.   Extremities: No lower extremity edema. No clubbing or deformities. Neuro: Alert and oriented x 3.  Grossly intact. Skin: Warm and dry, no jaundice.   Psych: Alert and cooperative, normal mood and affect.   Labs: CMP     Component Value Date/Time   NA 140 06/14/2020 1357   NA 141 10/15/2019 1205   NA 141 01/16/2014 0514   K 3.9 06/14/2020 1357   K 3.5 01/16/2014 0514   CL 101 06/14/2020 1357   CL 108 (H) 01/16/2014 0514   CO2 29 06/14/2020 1357   CO2 28 01/16/2014 0514   GLUCOSE 114 (H) 06/14/2020 1357   GLUCOSE 104 (H) 01/16/2014 0514   BUN 21 06/14/2020 1357   BUN 15 10/15/2019 1205   BUN 2 (L) 01/16/2014 0514   CREATININE 0.67 06/14/2020 1357   CALCIUM 9.1 06/14/2020 1357   CALCIUM 7.6 (L) 01/16/2014 0514   PROT 6.4 06/14/2020 1357   PROT 6.7 10/15/2019 1205   PROT 6.2 (L) 01/11/2014 1121   ALBUMIN 3.9 03/28/2020 1241   ALBUMIN 4.3 10/15/2019 1205   ALBUMIN 3.0 (L) 01/11/2014 1121   AST 21 06/14/2020 1357   AST 24 01/11/2014 1121   ALT 33 (H) 06/14/2020 1357   ALT 16 01/11/2014 1121   ALKPHOS 86 03/28/2020 1241   ALKPHOS 69 01/11/2014 1121   BILITOT 1.2 06/14/2020 1357   BILITOT 1.4 (H) 10/15/2019 1205   BILITOT 0.4 01/11/2014 1121   GFRNONAA 88 06/14/2020 1357   GFRAA 102 06/14/2020 1357   Lab Results  Component Value Date   WBC 5.1 03/28/2020   HGB 12.0 03/28/2020   HCT 36.5 03/28/2020   MCV 87.7 03/28/2020   PLT 201 03/28/2020    Imaging Studies: No results found.  Assessment and Plan:    Holly Green is a 74 y.o. y/o female with history of constipation iron deficiency anemia here for follow-up  Anemia resolved Continue follow-up with hematology No indication for small bowel capsule study at this time Constipation resolved High-fiber diet discussed Continue MiraLAX daily and titrate as necessary, instructions for the same discussed as well  Patient encouraged to call us if symptoms change    Dr Holly Green

## 2020-06-23 ENCOUNTER — Telehealth: Payer: Self-pay | Admitting: Family Medicine

## 2020-06-23 NOTE — Telephone Encounter (Signed)
Left detailed vm separate vitamin D 2000 and do not need to increase calcium

## 2020-06-23 NOTE — Telephone Encounter (Signed)
Nurse Returned call to patient and read her Holly Green's lab note dated 06/15/20. Patient has seen note in My chart but states that she is taking a Calcium and Vitamin D combination medication over the counter. She has questions about increasing Vit D. Does she also increase her Calcium. If she switches to the 2000IU Vit D will she need another calcium supplement. Please advise patient.  Pt called today because she had not heard anything back from this call date.  Please advise

## 2020-06-30 ENCOUNTER — Ambulatory Visit (INDEPENDENT_AMBULATORY_CARE_PROVIDER_SITE_OTHER): Payer: Medicare Other

## 2020-06-30 DIAGNOSIS — J449 Chronic obstructive pulmonary disease, unspecified: Secondary | ICD-10-CM

## 2020-06-30 DIAGNOSIS — I10 Essential (primary) hypertension: Secondary | ICD-10-CM | POA: Diagnosis not present

## 2020-06-30 NOTE — Chronic Care Management (AMB) (Signed)
Chronic Care Management   Initial Visit Note  06/30/2020 Name: Holly Green MRN: [id number] DOB: 1946-11-24  Primary Care Provider: Delsa Grana, PA-C Reason for referral : Chronic Care Management   Holly Green is a 74 y.o. year old female who is a primary care patient of Delsa Grana, [place]. The CCM team was consulted for assistance with chronic disease management and care coordination. A telephonic assessment was conducted today.   Review of Holly Green status, including review of consultants reports, relevant labs and test results was conducted today. Collaboration with appropriate care team members was performed as part of the comprehensive evaluation and provision of chronic care management services.    SDOH (Social Determinants of Health) assessments performed: Yes  SDOH Interventions     Most Recent Value  SDOH Interventions  Food Insecurity Interventions Intervention Not Indicated  Transportation Interventions Intervention Not Indicated       Medications: Outpatient Encounter Medications as of 06/30/2020  Medication Sig  . acetaminophen (TYLENOL) 650 MG CR tablet Take 650-1,300 mg by mouth every 8 (eight) hours as needed for pain.  Marland Kitchen alendronate (FOSAMAX) 70 MG tablet Take 1 tablet (70 mg total) by mouth every Sunday. Take with a full glass of water on an empty stomach.  . bisoprolol-hydrochlorothiazide (ZIAC) 10-6.25 MG tablet Take 1 tablet by mouth daily.  . hydrALAZINE (APRESOLINE) 10 MG tablet Take 1 tablet (10 mg total) by mouth 3 (three) times daily. Prn if bp above 150/90  . hydroxychloroquine (PLAQUENIL) 200 MG tablet Take 1 tablet by mouth in the morning and at bedtime.  Marland Kitchen loratadine (CLARITIN) 10 MG tablet Take 1 tablet (10 mg total) by mouth daily.  Marland Kitchen losartan (COZAAR) 50 MG tablet Take 1 tablet (50 mg total) by mouth daily.  Marland Kitchen lovastatin (MEVACOR) 40 MG tablet Take 1 tablet (40 mg total) by mouth at bedtime.  . polyethylene glycol (MIRALAX) 17 g packet Take 17 g  by mouth daily.  . predniSONE (DELTASONE) 5 MG tablet Take by mouth.  . Calcium Carb-Cholecalciferol (CALCIUM-VITAMIN D) 600-400 MG-UNIT TABS Take 1 tablet by mouth daily.   No facility-administered encounter medications on file as of 06/30/2020.     Objective:   BP Readings from Last 3 Encounters:  06/21/20 (!) 135/76  06/14/20 132/78  04/14/20 127/79   Lab Results  Component Value Date   CHOL 178 06/14/2020   HDL 82 06/14/2020   LDLCALC 84 06/14/2020   TRIG 50 06/14/2020   CHOLHDL 2.2 06/14/2020   Last vitamin D Lab Results  Component Value Date   VD25OH 24 (L) 06/14/2020    Goals Addressed            This Visit's Progress   . Chronic Disease Management       CARE PLAN ENTRY (see longitudinal plan of care for additional care plan information)  Current Barriers:  . Chronic Disease Management support and education needs related to HTN, HLD, GERD and Chronic Obstructive Airway Disease.  Case Manager Clinical Goal(s):  Over the next 120 days, patient will: . Not require hospitalization or emergent care d/t complications r/t chronic illnesses. . Continue take all medications as prescribed. . Attend all scheduled medical appointments. . Monitor blood pressure daily and record readings. . Follow recommended safety precautions to prevent falls and injuries. Over the next 30 days, patient will: . Update provider if edema to right hand worsens.   Interventions:  . Inter-disciplinary care team collaboration (see longitudinal plan of care) . Reviewed medications.  Advised to continue taking medications as prescribed and notify provider if unable to tolerate prescribed regimen. Encouraged to update care management team  with concerns regarding prescription costs . Discussed established blood pressure parameters and indications for notifying provider. Encouraged to monitor daily and record readings. Reports not monitoring today but reports significant improvements with home  readings. Reading was 121/69 earlier this week. She will attempt to monitor daily. . Discussed current activity tolerance, safety precautions and restriction r/t obstructive airway disease. Encouraged to use assistive device when ambulating if needed. Advised to prevent prolonged activities that may potentially cause overexertion. Reports ambulating well and completing ADLs and IADLs independently. Reports using extra caution when ambulating outside of the home and taking frequent breaks as needed.  Denies c/o chest pain or shortness of breath. No recent decline in activity tolerance. She does reports edema to her right hand/middle finger. Reports the edema has not worsened since being evaluated by her provider. Currently it is not painful or impacting her ability to perform self care. Aware of indications for updating her provider. . Reviewed pending/scheduled appointments. Encouraged to attend medical appointments as scheduled to prevent delays in care. Encouraged to update care management team with concerns regarding transportation. . Discussed plans for ongoing care management and follow up. Provided direct contact information.   Patient Self Care Activities:  . Self administers medications  . Attends scheduled provider appointments . Calls pharmacy for medication refills . Attends church or other social activities . Performs ADL's independently . Performs IADL's independently   Initial goal documentation        Holly Green was given information about Chronic Care Management services including:  1. CCM service includes personalized support from designated clinical staff supervised by her physician, including individualized plan of care and coordination with other care providers 2. 24/7 contact phone numbers for assistance for urgent and routine care needs. 3. Service will only be billed when office clinical staff spend 20 minutes or more in a month to coordinate care. 4. Only one practitioner  may furnish and bill the service in a calendar month. 5. The patient may stop CCM services at any time (effective at the end of the month) by phone call to the office staff. 6. The patient will be responsible for cost sharing (co-pay) of up to 20% of the service fee (after annual deductible is met).  Patient agreed to services and verbal consent obtained.    PLAN The care management team will follow-up with Holly Green next month.   [facility] [phone]

## 2020-07-03 NOTE — Patient Instructions (Addendum)
Thank you for allowing the Chronic Care Management team to participate in your care.   Goals Addressed            This Visit's Progress   . Chronic Disease Management       CARE PLAN ENTRY (see longitudinal plan of care for additional care plan information)  Current Barriers:  . Chronic Disease Management support and education needs related to HTN, HLD, GERD and Chronic Obstructive Airway Disease.  Case Manager Clinical Goal(s):  Over the next 120 days, patient will: . Not require hospitalization or emergent care d/t complications r/t chronic illnesses. . Continue take all medications as prescribed. . Attend all scheduled medical appointments. . Monitor blood pressure daily and record readings. . Follow recommended safety precautions to prevent falls and injuries. Over the next 30 days, patient will: . Update provider if edema to right hand worsens.   Interventions:  . Inter-disciplinary care team collaboration (see longitudinal plan of care) . Reviewed medications. Advised to continue taking medications as prescribed and notify provider if unable to tolerate prescribed regimen. Encouraged to update care management team  with concerns regarding prescription costs . Discussed established blood pressure parameters and indications for notifying provider. Encouraged to monitor daily and record readings. Reports not monitoring today but reports significant improvements with home readings. Reading was 121/69 earlier this week. She will attempt to monitor daily. . Discussed current activity tolerance, safety precautions and restriction r/t obstructive airway disease. Encouraged to use assistive device when ambulating if needed. Advised to prevent prolonged activities that may potentially cause overexertion. Reports ambulating well and completing ADLs and IADLs independently. Reports using extra caution when ambulating outside of the home and taking frequent breaks as needed.  Denies c/o chest  pain or shortness of breath. No recent decline in activity tolerance. She does reports edema to her right hand/middle finger. Reports the edema has not worsened since being evaluated by her provider. Currently it is not painful or impacting her ability to perform self care. Aware of indications for updating her provider. . Reviewed pending/scheduled appointments. Encouraged to attend medical appointments as scheduled to prevent delays in care. Encouraged to update care management team with concerns regarding transportation. . Discussed plans for ongoing care management and follow up. Provided direct contact information.   Patient Self Care Activities:  . Self administers medications  . Attends scheduled provider appointments . Calls pharmacy for medication refills . Attends church or other social activities . Performs ADL's independently . Performs IADL's independently   Initial goal documentation        Ms. Cassells was given information about Chronic Care Management services including:  1. CCM service includes personalized support from designated clinical staff supervised by her physician, including individualized plan of care and coordination with other care providers 2. 24/7 contact phone numbers for assistance for urgent and routine care needs. 3. Service will only be billed when office clinical staff spend 20 minutes or more in a month to coordinate care. 4. Only one practitioner may furnish and bill the service in a calendar month. 5. The patient may stop CCM services at any time (effective at the end of the month) by phone call to the office staff. 6. The patient will be responsible for cost sharing (co-pay) of up to 20% of the service fee (after annual deductible is met).  Patient agreed to services and verbal consent obtained.    Ms. Ransier verbalized understanding of the information discussed during the telephonic outreach today. Declined need  for a mailed/printed copy of the  instructions.   The care management team will follow-up with Ms. Goga next month.   [facility] [phone]

## 2020-07-10 ENCOUNTER — Ambulatory Visit: Payer: Medicare Other

## 2020-07-10 NOTE — Chronic Care Management (AMB) (Signed)
  Chronic Care Management   Note  07/10/2020 Name: Holly Green MRN: [id number] DOB: February 25, 1946   Care Coordination Only: Resources and Documents submitted.    PLAN The care management team will follow-up with Ms. Hitsman as scheduled next month.    [facility] [phone]

## 2020-08-17 ENCOUNTER — Other Ambulatory Visit: Payer: Self-pay

## 2020-08-17 ENCOUNTER — Encounter: Payer: Self-pay | Admitting: Adult Health

## 2020-08-17 ENCOUNTER — Ambulatory Visit (INDEPENDENT_AMBULATORY_CARE_PROVIDER_SITE_OTHER): Payer: Medicare Other | Admitting: Adult Health

## 2020-08-17 VITALS — BP 142/84 | HR 66 | Temp 97.8°F | Ht 66.0 in | Wt 198.0 lb

## 2020-08-17 DIAGNOSIS — Z23 Encounter for immunization: Secondary | ICD-10-CM

## 2020-08-17 DIAGNOSIS — R05 Cough: Secondary | ICD-10-CM | POA: Diagnosis not present

## 2020-08-17 DIAGNOSIS — R053 Chronic cough: Secondary | ICD-10-CM

## 2020-08-17 NOTE — Patient Instructions (Addendum)
Activity as tolerated.  Flu shot today  Follow up in 1 year with Dr. Mortimer Fries and As needed

## 2020-08-17 NOTE — Assessment & Plan Note (Signed)
Chronic cough resolved with discontinuation of ACE inhibitor.  Patient did have some mild to moderate restriction on PFTs.  She has never smoked has no significant symptoms currently.  Is not on any inhalers.  Activity level seems to be at baseline. There was no significant airflow obstruction on PFTs.  Doubt she has any significant COPD. Patient appears to be doing well.  We will follow-up in 1 year.  We will continue to monitor closely as had slightly some restriction and decreased DLCO-patient does have underlying rheumatoid arthritis.  But no symptoms to suggest possible pulmonary involvement at this time.  Plan Patient Instructions  Activity as tolerated.  Flu shot today  Follow up in 1 year with Dr. Mortimer Fries and As needed

## 2020-08-17 NOTE — Progress Notes (Signed)
@Patient  ID: Holly Green, female    DOB: Mar 31, 1946, 74 y.o.   MRN: [id number]  Chief Complaint  Patient presents with  . 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  Lungs are otherwise unremarkable.  Imaging personally reviewed. **CBC 12/15/2017>> absolute eosinophil count 100 **PFT 07/21/2018>>   FVC 76% predicted, FEV1 of 69% predicted, there is no significant improvement bronchodilator.  Ratio 72%.  Flow volume loop appears obstructed.  TLC is elevated at 153%, RV/TLC ratio is elevated.  DLCO is slightly reduced at 70%. - Overall this test shows significant air trapping which is suggestive of mild to moderate COPD.  08/17/2020 Follow up : COPD  Patient returns for a 1 year follow-up.  Patient has been followed for COPD with chronic bronchitis .  Patient says that she presented initially with a chronic cough that would not go away and chest x-ray showed some hyperinflation.  Patient is a never smoker.  She says she improved significantly after her ACE inhibitor was stopped.  She no longer has any cough.  She denies any shortness of breath or wheezing.  Previous pulmonary function testing in 2019 showed moderate restriction with no airflow obstruction. She is not on any maintenance inhaler.  Or rescue inhalers. Chest xray 2019 with bronchitis changes.  Retired from [employer], grew up with [employer].  Patient says she is active around her house.  Does not have any significant shortness of breath.  Says she moves a little slow but relates it to her age. Patient does have rheumatoid  arthritis and is on Plaquenil.  Says it manages her joint pain okay.   Allergies  Allergen Reactions  . Aspirin Other (See Comments)    Diverticulosis  . Lisinopril Cough    Immunization History  Administered Date(s) Administered  . Fluad Quad(high Dose 65+) 10/15/2019  . Influenza, High Dose Seasonal PF 08/06/2018  . PFIZER SARS-COV-2 Vaccination 01/19/2020, 02/09/2020  . Pneumococcal Conjugate-13 08/06/2018  . Tdap 06/15/2012    Past Medical History:  Diagnosis Date  . Abdominal aortic atherosclerosis ([place]) 03/05/2017   CT scan April 2018  . Arthritis   . Bleeding disorder ([place])   . Diverticulitis 2015  . GERD (gastroesophageal reflux disease)   . History of kidney stones   . History of stomach ulcers   . Hyperlipidemia   . Hypertension   . Osteoporosis   . Pre-diabetes   . Prediabetes 10/15/2015  . Spondylolisthesis at L4-L5 level 03/07/2017    Tobacco History: Social History   Tobacco Use  Smoking Status Never Smoker  Smokeless Tobacco Never Used   Counseling given: Not Answered   Outpatient Medications Prior to Visit  Medication Sig Dispense Refill  . acetaminophen (TYLENOL) 650 MG CR tablet Take 650-1,300 mg by mouth every 8 (eight) hours as needed for pain.    Marland Kitchen alendronate (FOSAMAX) 70 MG tablet Take 1 tablet (70 mg total) by mouth every Sunday. Take with a full glass of water on an empty stomach. 12 tablet 0  . bisoprolol-hydrochlorothiazide (ZIAC) 10-6.25 MG tablet Take 1 tablet by mouth daily. 90 tablet 3  . Calcium Carb-Cholecalciferol (CALCIUM-VITAMIN  D) 600-400 MG-UNIT TABS Take 1 tablet by mouth daily.    . hydrALAZINE (APRESOLINE) 10 MG tablet Take 1 tablet (10 mg total) by mouth 3 (three) times daily. Prn if bp above 150/90 60 tablet 0  . hydroxychloroquine (PLAQUENIL) 200 MG tablet Take 1 tablet by mouth in the morning and at bedtime.    Marland Kitchen loratadine (CLARITIN) 10 MG tablet Take 1 tablet (10 mg total) by mouth daily. 30 tablet 11  . losartan  (COZAAR) 50 MG tablet Take 1 tablet (50 mg total) by mouth daily. 90 tablet 3  . lovastatin (MEVACOR) 40 MG tablet Take 1 tablet (40 mg total) by mouth at bedtime. 90 tablet 3  . polyethylene glycol (MIRALAX) 17 g packet Take 17 g by mouth daily. 30 each 2   No facility-administered medications prior to visit.     Review of Systems:   Constitutional:   No  weight loss, night sweats,  Fevers, chills, fatigue, or  lassitude.  HEENT:   No headaches,  Difficulty swallowing,  Tooth/dental problems, or  Sore throat,                No sneezing, itching, ear ache, nasal congestion, post nasal drip,   CV:  No chest pain,  Orthopnea, PND, swelling in lower extremities, anasarca, dizziness, palpitations, syncope.   GI  No heartburn, indigestion, abdominal pain, nausea, vomiting, diarrhea, change in bowel habits, loss of appetite, bloody stools.   Resp: No shortness of breath with exertion or at rest.  No excess mucus, no productive cough,  No non-productive cough,  No coughing up of blood.  No change in color of mucus.  No wheezing.  No chest wall deformity  Skin: no rash or lesions.  GU: no dysuria, change in color of urine, no urgency or frequency.  No flank pain, no hematuria   MS:  No joint pain or swelling.  No decreased range of motion.  No back pain.    Physical Exam  BP (!) 142/84 (BP Location: Left Arm, Cuff Size: Normal)   Pulse 66   Temp 97.8 F (36.6 C) (Temporal)   Ht 5\' 6"  (1.676 m)   Wt 198 lb (89.8 kg)   LMP  (LMP Unknown)   SpO2 99%   BMI 31.96 kg/m   GEN: A/Ox3; pleasant , NAD, well nourished    HEENT:  [place]/AT,    NOSE-clear, THROAT-clear, no lesions, no postnasal drip or exudate noted.   NECK:  Supple w/ fair ROM; no JVD; normal carotid impulses w/o bruits; no thyromegaly or nodules palpated; no lymphadenopathy.    RESP  Clear  P & A; w/o, wheezes/ rales/ or rhonchi. no accessory muscle use, no dullness to percussion  CARD:  RRR, no m/r/g, no peripheral edema,  pulses intact, no cyanosis or clubbing.  GI:   Soft & nt; nml bowel sounds; no organomegaly or masses detected.   Musco: Warm bil, no deformities or joint swelling noted.   Neuro: alert, no focal deficits noted.    Skin: Warm, no lesions or rashes    Lab Results:    BNP No results found for: BNP  ProBNP No results found for: PROBNP  Imaging: No results found.    No flowsheet data found.  No results found for: NITRICOXIDE      Assessment & Plan:   Chronic cough Chronic cough resolved with discontinuation of ACE inhibitor.  Patient did have some mild to moderate restriction on PFTs.  She has never smoked  has no significant symptoms currently.  Is not on any inhalers.  Activity level seems to be at baseline. There was no significant airflow obstruction on PFTs.  Doubt she has any significant COPD. Patient appears to be doing well.  We will follow-up in 1 year.  We will continue to monitor closely as had slightly some restriction and decreased DLCO-patient does have underlying rheumatoid arthritis.  But no symptoms to suggest possible pulmonary involvement at this time.  Plan Patient Instructions  Activity as tolerated.  Flu shot today  Follow up in 1 year with Dr. Mortimer Fries and As needed            Rexene Edison, NP 08/17/2020

## 2020-08-21 ENCOUNTER — Ambulatory Visit
Admission: RE | Admit: 2020-08-21 | Discharge: 2020-08-21 | Disposition: A | Discharge status: Home / Self Care | Payer: Medicare Other | Source: Ambulatory Visit | Attending: Family Medicine | Admitting: Family Medicine

## 2020-08-21 ENCOUNTER — Other Ambulatory Visit: Payer: Self-pay

## 2020-08-21 DIAGNOSIS — Z5181 Encounter for therapeutic drug level monitoring: Secondary | ICD-10-CM | POA: Insufficient documentation

## 2020-08-21 DIAGNOSIS — M810 Age-related osteoporosis without current pathological fracture: Secondary | ICD-10-CM | POA: Insufficient documentation

## 2020-08-21 DIAGNOSIS — Z1231 Encounter for screening mammogram for malignant neoplasm of breast: Secondary | ICD-10-CM | POA: Insufficient documentation

## 2020-08-23 ENCOUNTER — Telehealth: Payer: Self-pay

## 2020-08-23 NOTE — Telephone Encounter (Signed)
  Chronic Care Management   Outreach Note  08/23/2020 Name: Holly Green MRN: [id number] DOB: May 16, 1946  Primary Care Provider: Delsa Grana, PA-C Reason for referral : Chronic Care Management   An unsuccessful telephone outreach was attempted today. Holly Green is currently engaged with the chronic care management team.   Follow Up Plan:  A HIPAA compliant voice message was left today requesting a return call.   [facility] [phone]

## 2020-08-24 ENCOUNTER — Encounter: Payer: Self-pay | Admitting: Family Medicine

## 2020-08-24 NOTE — Progress Notes (Signed)
Bone Density shows improving bone density - (aka bones got stronger) From what I can see she has been on fosamax for 5 years and she would not continue this medications after 5 years. Can repeat Dexa in 2-3 years to monitor - some women resume fosamax after a 3 year break   She should continue supplements daily - D 3 and Calcium 1000 IU and 1200 mg  Please call and notify pt of results   Osteoporosis - Most recent dexa shows improved spine bone density osteoporosis ---> osteopenia, completed 5 years alendronate 2016 to current - d/c meds  F/up dexa in 2-3 years

## 2020-09-04 ENCOUNTER — Telehealth: Payer: Self-pay

## 2020-09-11 ENCOUNTER — Ambulatory Visit: Payer: Medicare Other | Attending: Internal Medicine

## 2020-09-11 DIAGNOSIS — Z23 Encounter for immunization: Secondary | ICD-10-CM

## 2020-09-11 NOTE — Progress Notes (Signed)
° °  Covid-19 Vaccination Clinic  Name:  ARTEMISA SLADEK    MRN: [id number] DOB: 03-02-1946  09/11/2020  Ms. Maclellan was observed post Covid-19 immunization for 15 minutes without incident. She was provided with Vaccine Information Sheet and instruction to access the V-Safe system.   Ms. Dizdarevic was instructed to call 911 with any severe reactions post vaccine:  Difficulty breathing   Swelling of face and throat   A fast heartbeat   A bad rash all over body   Dizziness and weakness

## 2020-09-27 ENCOUNTER — Other Ambulatory Visit: Payer: Self-pay

## 2020-09-27 ENCOUNTER — Inpatient Hospital Stay: Payer: Medicare Other | Attending: Oncology

## 2020-09-27 DIAGNOSIS — K921 Melena: Secondary | ICD-10-CM | POA: Insufficient documentation

## 2020-09-27 DIAGNOSIS — E785 Hyperlipidemia, unspecified: Secondary | ICD-10-CM | POA: Diagnosis not present

## 2020-09-27 DIAGNOSIS — K5730 Diverticulosis of large intestine without perforation or abscess without bleeding: Secondary | ICD-10-CM | POA: Insufficient documentation

## 2020-09-27 DIAGNOSIS — R0609 Other forms of dyspnea: Secondary | ICD-10-CM | POA: Insufficient documentation

## 2020-09-27 DIAGNOSIS — D509 Iron deficiency anemia, unspecified: Secondary | ICD-10-CM

## 2020-09-27 DIAGNOSIS — I10 Essential (primary) hypertension: Secondary | ICD-10-CM | POA: Insufficient documentation

## 2020-09-27 DIAGNOSIS — K219 Gastro-esophageal reflux disease without esophagitis: Secondary | ICD-10-CM | POA: Diagnosis not present

## 2020-09-27 DIAGNOSIS — Z79899 Other long term (current) drug therapy: Secondary | ICD-10-CM | POA: Insufficient documentation

## 2020-09-27 DIAGNOSIS — R5383 Other fatigue: Secondary | ICD-10-CM | POA: Insufficient documentation

## 2020-09-27 LAB — CBC WITH DIFFERENTIAL/PLATELET
Abs Immature Granulocytes: 0.01 10*3/uL (ref 0.00–0.07)
Basophils Absolute: 0 10*3/uL (ref 0.0–0.1)
Basophils Relative: 0 %
Eosinophils Absolute: 0.2 10*3/uL (ref 0.0–0.5)
Eosinophils Relative: 3 %
HCT: 33.5 % — ABNORMAL LOW (ref 36.0–46.0)
Hemoglobin: 11.4 g/dL — ABNORMAL LOW (ref 12.0–15.0)
Immature Granulocytes: 0 %
Lymphocytes Relative: 26 %
Lymphs Abs: 1.4 10*3/uL (ref 0.7–4.0)
MCH: 30.2 pg (ref 26.0–34.0)
MCHC: 34 g/dL (ref 30.0–36.0)
MCV: 88.9 fL (ref 80.0–100.0)
Monocytes Absolute: 0.4 10*3/uL (ref 0.1–1.0)
Monocytes Relative: 7 %
Neutro Abs: 3.3 10*3/uL (ref 1.7–7.7)
Neutrophils Relative %: 64 %
Platelets: 201 10*3/uL (ref 150–400)
RBC: 3.77 MIL/uL — ABNORMAL LOW (ref 3.87–5.11)
RDW: 13.4 % (ref 11.5–15.5)
WBC: 5.2 10*3/uL (ref 4.0–10.5)
nRBC: 0 % (ref 0.0–0.2)

## 2020-09-27 LAB — COMPREHENSIVE METABOLIC PANEL
ALT: 19 U/L (ref 0–44)
AST: 29 U/L (ref 15–41)
Albumin: 3.8 g/dL (ref 3.5–5.0)
Alkaline Phosphatase: 69 U/L (ref 38–126)
Anion gap: 8 (ref 5–15)
BUN: 16 mg/dL (ref 8–23)
CO2: 29 mmol/L (ref 22–32)
Calcium: 9.4 mg/dL (ref 8.9–10.3)
Chloride: 102 mmol/L (ref 98–111)
Creatinine, Ser: 0.8 mg/dL (ref 0.44–1.00)
GFR, Estimated: >60 mL/min (ref >60)
Glucose, Bld: 95 mg/dL (ref 70–99)
Potassium: 3.3 mmol/L — ABNORMAL LOW (ref 3.5–5.1)
Sodium: 139 mmol/L (ref 135–145)
Total Bilirubin: 1.9 mg/dL — ABNORMAL HIGH (ref 0.3–1.2)
Total Protein: 7.2 g/dL (ref 6.5–8.1)

## 2020-09-27 LAB — IRON AND TIBC
Iron: 85 ug/dL (ref 28–170)
Saturation Ratios: 23 % (ref 10.4–31.8)
TIBC: 368 ug/dL (ref 250–450)
UIBC: 283 ug/dL

## 2020-09-27 LAB — FERRITIN: Ferritin: 37 ng/mL (ref 11–307)

## 2020-09-28 ENCOUNTER — Telehealth: Payer: Self-pay

## 2020-09-28 NOTE — Telephone Encounter (Signed)
Copied from [place] [id number]. Topic: General - Other >> Sep 28, 2020 12:08 PM Rainey Pines A wrote: Patient requesting callback from nurse to speak about medication prescribed for UTI that she feels is not assisting her

## 2020-09-29 ENCOUNTER — Other Ambulatory Visit: Payer: Self-pay

## 2020-09-29 ENCOUNTER — Encounter: Payer: Self-pay | Admitting: Internal Medicine

## 2020-09-29 ENCOUNTER — Ambulatory Visit (INDEPENDENT_AMBULATORY_CARE_PROVIDER_SITE_OTHER): Payer: Medicare Other | Admitting: Internal Medicine

## 2020-09-29 ENCOUNTER — Inpatient Hospital Stay (HOSPITAL_BASED_OUTPATIENT_CLINIC_OR_DEPARTMENT_OTHER): Payer: Medicare Other | Admitting: Oncology

## 2020-09-29 ENCOUNTER — Encounter: Payer: Self-pay | Admitting: Oncology

## 2020-09-29 ENCOUNTER — Telehealth: Payer: Self-pay | Admitting: Family Medicine

## 2020-09-29 ENCOUNTER — Inpatient Hospital Stay: Payer: Medicare Other

## 2020-09-29 VITALS — BP 176/100 | HR 75 | Temp 98.0°F | Resp 16 | Ht 66.0 in | Wt 197.1 lb

## 2020-09-29 VITALS — BP 172/89 | HR 74 | Temp 97.3°F | Wt 197.7 lb

## 2020-09-29 DIAGNOSIS — N3001 Acute cystitis with hematuria: Secondary | ICD-10-CM | POA: Diagnosis not present

## 2020-09-29 DIAGNOSIS — K5730 Diverticulosis of large intestine without perforation or abscess without bleeding: Secondary | ICD-10-CM

## 2020-09-29 DIAGNOSIS — D509 Iron deficiency anemia, unspecified: Secondary | ICD-10-CM

## 2020-09-29 DIAGNOSIS — I10 Essential (primary) hypertension: Secondary | ICD-10-CM | POA: Diagnosis not present

## 2020-09-29 DIAGNOSIS — R300 Dysuria: Secondary | ICD-10-CM | POA: Diagnosis not present

## 2020-09-29 HISTORY — DX: Acute cystitis with hematuria: N30.01

## 2020-09-29 LAB — HEPATIC FUNCTION PANEL
ALT: 22 U/L (ref 0–44)
AST: 30 U/L (ref 15–41)
Albumin: 3.7 g/dL (ref 3.5–5.0)
Alkaline Phosphatase: 67 U/L (ref 38–126)
Bilirubin, Direct: 0.3 mg/dL — ABNORMAL HIGH (ref 0.0–0.2)
Indirect Bilirubin: 1.2 mg/dL — ABNORMAL HIGH (ref 0.3–0.9)
Total Bilirubin: 1.5 mg/dL — ABNORMAL HIGH (ref 0.3–1.2)
Total Protein: 7.1 g/dL (ref 6.5–8.1)

## 2020-09-29 LAB — POCT URINALYSIS DIPSTICK
Bilirubin, UA: NEGATIVE
Blood, UA: POSITIVE
Glucose, UA: POSITIVE — AB
Ketones, UA: NEGATIVE
Nitrite, UA: NEGATIVE
Protein, UA: POSITIVE — AB
Spec Grav, UA: 1.015 (ref 1.010–1.025)
Urobilinogen, UA: 0.2 E.U./dL
pH, UA: 7 (ref 5.0–8.0)

## 2020-09-29 MED ORDER — SULFAMETHOXAZOLE-TRIMETHOPRIM 800-160 MG PO TABS: 1.0000 | ORAL_TABLET | Freq: Two times a day (BID) | ORAL | 0 refills | Status: AC

## 2020-09-29 NOTE — Telephone Encounter (Signed)
Saw Dr.Hendrickson today

## 2020-09-29 NOTE — Telephone Encounter (Signed)
Pharmacy called about the Rx for sulfamethoxazole-trimethoprim (BACTRIM DS) 800-160 MG tablet  And states  It increases the effects of the methotrexate that the Pt also takes /the pharmacy wanted to know if this is ok since its a five day course or should she scale back on her methotrexate RX/ please advise asap

## 2020-09-29 NOTE — Patient Instructions (Addendum)
Please pick up and start the medication prescribed to your pharmacy to treat the urinary tract infection  Stay well-hydrated.   Urinary Tract Infection, Adult A urinary tract infection (UTI) is an infection of any part of the urinary tract. The urinary tract includes:  The kidneys.  The ureters.  The bladder.  The urethra. These organs make, store, and get rid of pee (urine) in the body. What are the causes? This is caused by germs (bacteria) in your genital area. These germs grow and cause swelling (inflammation) of your urinary tract. What increases the risk? You are more likely to develop this condition if:  You have a small, thin tube (catheter) to drain pee.  You cannot control when you pee or poop (incontinence).  You are female, and: ? You use these methods to prevent pregnancy:  A medicine that kills sperm (spermicide).  A device that blocks sperm (diaphragm). ? You have low levels of a female hormone (estrogen). ? You are pregnant.  You have genes that add to your risk.  You are sexually active.  You take antibiotic medicines.  You have trouble peeing because of: ? A prostate that is bigger than normal, if you are female. ? A blockage in the part of your body that drains pee from the bladder (urethra). ? A kidney stone. ? A nerve condition that affects your bladder (neurogenic bladder). ? Not getting enough to drink. ? Not peeing often enough.  You have other conditions, such as: ? Diabetes. ? A weak disease-fighting system (immune system). ? Sickle cell disease. ? Gout. ? Injury of the spine. What are the signs or symptoms? Symptoms of this condition include:  Needing to pee right away (urgently).  Peeing often.  Peeing small amounts often.  Pain or burning when peeing.  Blood in the pee.  Pee that smells bad or not like normal.  Trouble peeing.  Pee that is cloudy.  Fluid coming from the vagina, if you are female.  Pain in the belly  or lower back. Other symptoms include:  Throwing up (vomiting).  No urge to eat.  Feeling mixed up (confused).  Being tired and grouchy (irritable).  A fever.  Watery poop (diarrhea). How is this treated? This condition may be treated with:  Antibiotic medicine.  Other medicines.  Drinking enough water. Follow these instructions at home:  Medicines  Take over-the-counter and prescription medicines only as told by your doctor.  If you were prescribed an antibiotic medicine, take it as told by your doctor. Do not stop taking it even if you start to feel better. General instructions  Make sure you: ? Pee until your bladder is empty. ? Do not hold pee for a long time. ? Empty your bladder after sex. ? Wipe from front to back after pooping if you are a female. Use each tissue one time when you wipe.  Drink enough fluid to keep your pee pale yellow.  Keep all follow-up visits as told by your doctor. This is important. Contact a doctor if:  You do not get better after 1-2 days.  Your symptoms go away and then come back. Get help right away if:  You have very bad back pain.  You have very bad pain in your lower belly.  You have a fever.  You are sick to your stomach (nauseous).  You are throwing up. Summary  A urinary tract infection (UTI) is an infection of any part of the urinary tract.  This condition is  caused by germs in your genital area.  There are many risk factors for a UTI. These include having a small, thin tube to drain pee and not being able to control when you pee or poop.  Treatment includes antibiotic medicines for germs.  Drink enough fluid to keep your pee pale yellow. This information is not intended to replace advice given to you by your health care provider. Make sure you discuss any questions you have with your health care provider. Document Revised: 10/29/2018 Document Reviewed: 05/21/2018 Elsevier Patient Education  2020 [employer].

## 2020-09-29 NOTE — Telephone Encounter (Signed)
Appointment made for 1/15.

## 2020-09-29 NOTE — Progress Notes (Signed)
Pt here for follow up. Pt reports heaviness and pain to legs at night.

## 2020-09-29 NOTE — Progress Notes (Signed)
Hematology/Oncology Consult note [facility] Telephone:([phone] [phone]   Patient Care Team: Delsa Grana, PA-C as PCP - General (Family Medicine) Marlowe Sax, MD as Referring Physician (Rheumatology) Virgel Manifold, MD as Consulting Physician (Gastroenterology) Neldon Labella, RN as Case Manager Earlie Server, MD as Consulting Physician (Hematology and Oncology)  REFERRING PROVIDER: Hubbard Hartshorn, FNP CHIEF COMPLAINTS/REASON FOR VISIT:  Evaluation of iron deficiency anemia  HISTORY OF PRESENTING ILLNESS:  Holly Green is a  74 y.o.  female with Holly Green listed below was seen in consultation at the request of Hubbard Hartshorn, FNP   for evaluation of iron deficiency anemia.  Patient has a remote history of diverticula bleeding status post positive bleeding scan and embolization 2009 and 2015. Patient was admitted from 12/29/2019-12/31/2019 due to GI bleeding.  Patient had a 2 g hemoglobin drop over the past 2 months prior to admission.  Hemoglobin was 10.  Iron panel showed iron deficiency with ferritin off 32 and iron saturation at 11.  Conservative measures was recommended by gastroenterology.  Patient was discharged. 02/22/2020, EGD showed small hiatal hernia, normal stomach and duodenum.  No specimen was collected. 11/09/2017, colonoscopy showed: Diverticulosis.  Otherwise normal. No recent blood work after February was done. Patient is currently taking oral iron supplementation . Associated signs and symptoms: Patient reports fatigue.  SOB with exertion.  Denies weight loss, easy bruising, hematochezia, hemoptysis, hematuria.  INTERVAL HISTORY [name] is a 74 y.o. female who has above history reviewed by me today presents for follow up visit for management of anemia Problems and complaints are listed below: Patient reports feeling well except " legs are heavy" She reports one episode of blood in the stool in September.  She did  not seek medical advice.   Review of Systems  Constitutional: Negative for appetite change, chills, fatigue and fever.  HENT:   Negative for hearing loss and voice change.   Eyes: Negative for eye problems.  Respiratory: Negative for chest tightness and cough.   Cardiovascular: Negative for chest pain.  Gastrointestinal: Negative for abdominal distention, abdominal pain and blood in stool.  Endocrine: Negative for hot flashes.  Genitourinary: Negative for difficulty urinating and frequency.   Musculoskeletal: Negative for arthralgias.  Skin: Negative for itching and rash.  Neurological: Negative for extremity weakness.  Hematological: Negative for adenopathy.  Psychiatric/Behavioral: Negative for confusion.    MEDICAL HISTORY:  Past Medical History:  Diagnosis Date  . Abdominal aortic atherosclerosis ([place]) 03/05/2017   CT scan April 2018  . Arthritis   . Bleeding disorder ([place])   . Diverticulitis 2015  . GERD (gastroesophageal reflux disease)   . History of kidney stones   . History of stomach ulcers   . Hyperlipidemia   . Hypertension   . Osteoporosis   . Pre-diabetes   . Prediabetes 10/15/2015  . Spondylolisthesis at L4-L5 level 03/07/2017    SURGICAL HISTORY: Past Surgical History:  Procedure Laterality Date  . ABDOMINAL HYSTERECTOMY    . BACK SURGERY  2010  . BREAST CYST ASPIRATION Right 1990's  . COLONOSCOPY Left 11/09/2017   Procedure: COLONOSCOPY;  Surgeon: Virgel Manifold, MD;  Location: [facility] ENDOSCOPY;  Service: Endoscopy;  Laterality: Left;  . COLONOSCOPY WITH PROPOFOL N/A 04/14/2020   Procedure: COLONOSCOPY WITH PROPOFOL;  Surgeon: Virgel Manifold, MD;  Location: [facility] ENDOSCOPY;  Service: Endoscopy;  Laterality: N/A;  . CYSTO WITH HYDRODISTENSION N/A 08/15/2017   Procedure: CYSTOSCOPY/HYDRODISTENSION;  Surgeon: Nickie Retort, MD;  Location:  [facility] ORS;  Service: Urology;  Laterality: N/A;  . ESOPHAGOGASTRODUODENOSCOPY Left 11/09/2017    Procedure: ESOPHAGOGASTRODUODENOSCOPY (EGD);  Surgeon: Virgel Manifold, MD;  Location: [facility] ENDOSCOPY;  Service: Endoscopy;  Laterality: Left;  . ESOPHAGOGASTRODUODENOSCOPY (EGD) WITH PROPOFOL N/A 02/22/2020   Procedure: ESOPHAGOGASTRODUODENOSCOPY (EGD) WITH PROPOFOL;  Surgeon: Lucilla Lame, MD;  Location: [facility] ENDOSCOPY;  Service: Endoscopy;  Laterality: N/A;  . FOOT SURGERY Right     SOCIAL HISTORY: Social History   Socioeconomic History  . Marital status: Single    Spouse name: Not on file  . Number of children: 1  . Years of education: Not on file  . Highest education level: High school graduate  Occupational History  . Occupation: retired  Tobacco Use  . Smoking status: Never Smoker  . Smokeless tobacco: Never Used  Vaping Use  . Vaping Use: Never used  Substance and Sexual Activity  . Alcohol use: No    Alcohol/week: 0.0 standard drinks  . Drug use: No  . Sexual activity: Never  Other Topics Concern  . Not on file  Social History Narrative  . Not on file   Social Determinants of Health   Financial Resource Strain: Low Risk   . Difficulty of Paying Living Expenses: Not hard at all  Food Insecurity: No Food Insecurity  . Worried About Charity fundraiser in the Last Year: Never true  . Ran Out of Food in the Last Year: Never true  Transportation Needs: No Transportation Needs  . Lack of Transportation (Medical): No  . Lack of Transportation (Non-Medical): No  Physical Activity: Inactive  . Days of Exercise per Week: 0 days  . Minutes of Exercise per Session: 0 min  Stress: No Stress Concern Present  . Feeling of Stress : Not at all  Social Connections: Moderately Integrated  . Frequency of Communication with Friends and Family: More than three times a week  . Frequency of Social Gatherings with Friends and Family: Once a week  . Attends Religious Services: More than 4 times per year  . Active Member of Clubs or Organizations: Yes  . Attends Theatre manager Meetings: Never  . Marital Status: Never married  Intimate Partner Violence: Not At Risk  . Fear of Current or Ex-Partner: No  . Emotionally Abused: No  . Physically Abused: No  . Sexually Abused: No    FAMILY HISTORY: Family History  Problem Relation Age of Onset  . Hypertension Brother   . Heart disease Brother   . Heart failure Brother   . Hypertension Mother   . Stroke Mother   . Stroke Father   . Prostate cancer Father   . Aneurysm Sister   . Breast cancer Sister 7  . Liver disease Maternal Aunt   . Heart failure Paternal Aunt   . Heart disease Paternal Uncle   . Heart attack Brother   . Heart disease Brother   . Breast cancer Maternal Aunt   . Heart disease Paternal Aunt   . Kidney cancer Neg Hx   . Bladder Cancer Neg Hx     ALLERGIES:  is allergic to aspirin and lisinopril.  MEDICATIONS:  Current Outpatient Medications  Medication Sig Dispense Refill  . acetaminophen (TYLENOL) 650 MG CR tablet Take 650-1,300 mg by mouth every 8 (eight) hours as needed for pain.    Marland Kitchen alendronate (FOSAMAX) 70 MG tablet Take 1 tablet (70 mg total) by mouth every Sunday. Take with a full glass of water on an empty  stomach. (Patient not taking: Reported on 09/29/2020) 12 tablet 0  . bisoprolol-hydrochlorothiazide (ZIAC) 10-6.25 MG tablet Take 1 tablet by mouth daily. 90 tablet 3  . Calcium Carb-Cholecalciferol (CALCIUM-VITAMIN D) 600-400 MG-UNIT TABS Take 1 tablet by mouth daily.    . folic acid (FOLVITE) 1 MG tablet Take 1 mg by mouth daily.    . hydroxychloroquine (PLAQUENIL) 200 MG tablet Take 1 tablet by mouth in the morning and at bedtime. (Patient not taking: Reported on 09/29/2020)    . loratadine (CLARITIN) 10 MG tablet Take 1 tablet (10 mg total) by mouth daily. 30 tablet 11  . losartan (COZAAR) 50 MG tablet Take 1 tablet (50 mg total) by mouth daily. 90 tablet 3  . lovastatin (MEVACOR) 40 MG tablet Take 1 tablet (40 mg total) by mouth at bedtime. 90 tablet 3  .  methotrexate 2.5 MG tablet Take 6 tablets (15 mg total) by mouth every 7 (seven) days All on the same day    . polyethylene glycol (MIRALAX) 17 g packet Take 17 g by mouth daily. 30 each 2  . sulfamethoxazole-trimethoprim (BACTRIM DS) 800-160 MG tablet Take 1 tablet by mouth 2 (two) times daily for 5 days. 10 tablet 0   No current facility-administered medications for this visit.     PHYSICAL EXAMINATION: ECOG PERFORMANCE STATUS: 1 - Symptomatic but completely ambulatory Vitals:   09/29/20 1012  BP: (!) 172/89  Pulse: 74  Temp: (!) 97.3 F (36.3 C)  SpO2: 100%   Filed Weights   09/29/20 1012  Weight: 197 lb 11.2 oz (89.7 kg)    Physical Exam Constitutional:      General: She is not in acute distress. HENT:     Head: Normocephalic and atraumatic.  Eyes:     General: No scleral icterus. Cardiovascular:     Rate and Rhythm: Normal rate and regular rhythm.     Heart sounds: Normal heart sounds.  Pulmonary:     Effort: Pulmonary effort is normal. No respiratory distress.     Breath sounds: No wheezing.  Abdominal:     General: Bowel sounds are normal. There is no distension.     Palpations: Abdomen is soft.  Musculoskeletal:        General: No deformity. Normal range of motion.     Cervical back: Normal range of motion and neck supple.  Skin:    General: Skin is warm and dry.     Findings: No erythema or rash.  Neurological:     Mental Status: She is alert and oriented to person, place, and time. Mental status is at baseline.     Cranial Nerves: No cranial nerve deficit.     Coordination: Coordination normal.  Psychiatric:        Mood and Affect: Mood normal.       CMP Latest Ref Rng & Units 09/29/2020  Glucose 70 - 99 mg/dL -  BUN 8 - 23 mg/dL -  Creatinine 0.44 - 1.00 mg/dL -  Sodium 135 - 145 mmol/L -  Potassium 3.5 - 5.1 mmol/L -  Chloride 98 - 111 mmol/L -  CO2 22 - 32 mmol/L -  Calcium 8.9 - 10.3 mg/dL -  Total Protein 6.5 - 8.1 g/dL 7.1  Total  Bilirubin 0.3 - 1.2 mg/dL 1.5(H)  Alkaline Phos 38 - 126 U/L 67  AST 15 - 41 U/L 30  ALT 0 - 44 U/L 22   CBC Latest Ref Rng & Units 09/27/2020  WBC 4.0 - 10.5  K/uL 5.2  Hemoglobin 12.0 - 15.0 g/dL 11.4(L)  Hematocrit 36 - 46 % 33.5(L)  Platelets 150 - 400 K/uL 201     LABORATORY DATA:  I have reviewed the data as listed Lab Results  Component Value Date   WBC 5.2 09/27/2020   HGB 11.4 (L) 09/27/2020   HCT 33.5 (L) 09/27/2020   MCV 88.9 09/27/2020   PLT 201 09/27/2020   Recent Labs    12/29/19 1018 12/31/19 0543 03/28/20 1241 03/28/20 1241 06/14/20 1357 09/27/20 1032 09/29/20 1055  NA   < > 140 137  --  140 139  --   K   < > 3.6 3.7  --  3.9 3.3*  --   CL   < > 107 103  --  101 102  --   CO2   < > 28 26  --  29 29  --   GLUCOSE   < > 86 100*  --  114* 95  --   BUN   < > 8 18  --  21 16  --   CREATININE   < > 0.67 0.58  --  0.67 0.80  --   CALCIUM   < > 8.3* 9.3  --  9.1 9.4  --   GFRNONAA   < > >60 >60  --  88 >60  --   GFRAA  --  >60 >60  --  102  --   --   PROT   < >  --  7.2   < > 6.4 7.2 7.1  ALBUMIN   < >  --  3.9  --   --  3.8 3.7  AST   < >  --  23   < > 21 29 30   ALT   < >  --  19   < > 33* 19 22  ALKPHOS   < >  --  86  --   --  69 67  BILITOT   < >  --  1.2   < > 1.2 1.9* 1.5*  BILIDIR  --   --   --   --   --   --  0.3*  IBILI  --   --   --   --   --   --  1.2*   < > = values in this interval not displayed.   Iron/TIBC/Ferritin/ %Sat    Component Value Date/Time   IRON 85 09/27/2020 1032   IRON 36 12/15/2017 1339   TIBC 368 09/27/2020 1032   TIBC 408 12/15/2017 1339   FERRITIN 37 09/27/2020 1032   FERRITIN 39 12/15/2017 1339   IRONPCTSAT 23 09/27/2020 1032   IRONPCTSAT 11 (L) 01/12/2020 1514     RADIOGRAPHIC STUDIES: I have personally reviewed the radiological images as listed and agreed with the findings in the report. No results found.     ASSESSMENT & PLAN:  1. Iron deficiency anemia, unspecified iron deficiency anemia type   2.  Diverticulosis of colon    #Iron deficiency anemia, Labs reviewed and discussed with patient Iron panel showed ferritin of 37, iron saturation 23. Hemoglobin slightly decreased. I discussed with patient about resuming oral iron supplementation. Patient reports having constipation from oral iron supplementation.  We discussed about the option of IV iron infusion. Prefers to take oral iron supplementation 3 times a week and see how her blood work looks like at the next visit and then decide.  Blood in the stool.  Patient has a history of diverticulosis. I recommend and encourage patient to follow-up with gastroenterology.  Elevated total bilirubin, LFT was checked again today which showed indirect bilirubinemia.  Lactic ovarian syndrome.   Orders Placed This Encounter  Procedures  . Hepatic function panel    Standing Status:   Future    Number of Occurrences:   1    Standing Expiration Date:   09/29/2021  . CBC with Differential/Platelet    Standing Status:   Future    Standing Expiration Date:   09/29/2021  . Iron and TIBC    Standing Status:   Future    Standing Expiration Date:   09/29/2021  . Ferritin    Standing Status:   Future    Standing Expiration Date:   09/29/2021    All questions were answered. The patient knows to call the clinic with any problems questions or concerns.  Cc Hubbard Hartshorn, FNP  Return of visit: 3 months  Earlie Server, MD, PhD Hematology Oncology [facility] at [facility] 09/29/2020

## 2020-09-29 NOTE — Progress Notes (Signed)
Patient ID: Holly Green, female    DOB: 02-Jan-1946, 74 y.o.   MRN: [id number]  PCP: Holly Grana, PA-C  Chief Complaint  Patient presents with  . Urinary Tract Infection    Subjective:   Holly Green is a 74 y.o. female, presents to clinic with CC of the following:  Chief Complaint  Patient presents with  . Urinary Tract Infection    HPI:  Patient is a 74 year old female patient of Holly Green Last visit with her was in July 2021 Follows up today with UTI concerns.  She noted her symptoms started on Saturday, with burning and stinging when she urinated.  Was not having any other symptoms, and no pain when not urinating.  Sunday it was a little better, although symptoms started to worsen again and she talked to her pharmacist.  Holly Green was recommended to start, and also staying well-hydrated.  She did that for a couple days, noted it did help some, although symptoms were starting to increase again and she contacted the office and an appointment made for her. She denied any fevers, no back pains, no pressure in the lower abdomen or pains in the lower abdomen, no obvious blood in the urine, and notes it still burns and stings when she urinates. Allergies-denied any major allergies although noted not to take aspirin products with her diverticular disease.  It is also noted she had a cough with lisinopril.    Patient Active Problem List   Diagnosis Date Noted  . Chronic cough 08/17/2020  . Rheumatoid arthritis of multiple sites with negative rheumatoid factor ([place]) 06/09/2020  . Special screening for malignant neoplasms, colon   . Abnormal CT scan, gastrointestinal tract   . Gastrointestinal hemorrhage 12/29/2019  . Left hip pain 10/15/2019  . Obesity (BMI 30.0-34.9) 08/10/2018  . Emphysema of lung ([place]) 08/10/2018  . Allergic rhinitis 08/10/2018  . COAD (chronic obstructive airways disease) ([place]) 08/03/2018  . Primary osteoarthritis of right knee 07/17/2018  .  Chondrocalcinosis 07/17/2018  . 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 Anemia 12/17/2017  . Diverticulosis of large intestine without diverticulitis   . History of GI bleed 11/06/2017  . Left flank pain, chronic 03/07/2017  . Spondylolisthesis at L4-L5 level 03/07/2017  . Abdominal aortic atherosclerosis ([place]) 03/05/2017  . Simple cyst of kidney 03/05/2017  . Benign liver cyst 03/05/2017  . Medication monitoring encounter 11/22/2016  . GERD (gastroesophageal reflux disease) 08/07/2016  . Osteoporosis 05/07/2016  . Cardiac murmur 05/07/2016  . Prediabetes 10/15/2015  . Hyperlipidemia 10/15/2015  . Essential hypertension 10/15/2015  . Diverticulosis of colon, acquired 10/15/2015      Current Outpatient Medications:  .  acetaminophen (TYLENOL) 650 MG CR tablet, Take 650-1,300 mg by mouth every 8 (eight) hours as needed for pain., Disp: , Rfl:  .  bisoprolol-hydrochlorothiazide (ZIAC) 10-6.25 MG tablet, Take 1 tablet by mouth daily., Disp: 90 tablet, Rfl: 3 .  Calcium Carb-Cholecalciferol (CALCIUM-VITAMIN D) 600-400 MG-UNIT TABS, Take 1 tablet by mouth daily., Disp: , Rfl:  .  folic acid (FOLVITE) 1 MG tablet, Take 1 mg by mouth daily., Disp: , Rfl:  .  loratadine (CLARITIN) 10 MG tablet, Take 1 tablet (10 mg total) by mouth daily., Disp: 30 tablet, Rfl: 11 .  losartan (COZAAR) 50 MG tablet, Take 1 tablet (50 mg total) by mouth daily., Disp: 90 tablet, Rfl: 3 .  lovastatin (MEVACOR) 40 MG tablet, Take 1 tablet (40 mg total) by mouth at bedtime.,  Disp: 90 tablet, Rfl: 3 .  methotrexate 2.5 MG tablet, Take 6 tablets (15 mg total) by mouth every 7 (seven) days All on the same day, Disp: , Rfl:  .  polyethylene glycol (MIRALAX) 17 g packet, Take 17 g by mouth daily., Disp: 30 each, Rfl: 2 .  alendronate (FOSAMAX) 70 MG tablet, Take 1 tablet (70 mg total) by mouth every Sunday. Take with a full glass of water on an empty stomach. (Patient not taking:  Reported on 09/29/2020), Disp: 12 tablet, Rfl: 0 .  hydroxychloroquine (PLAQUENIL) 200 MG tablet, Take 1 tablet by mouth in the morning and at bedtime. (Patient not taking: Reported on 09/29/2020), Disp: , Rfl:  .  sulfamethoxazole-trimethoprim (BACTRIM DS) 800-160 MG tablet, Take 1 tablet by mouth 2 (two) times daily for 5 days., Disp: 10 tablet, Rfl: 0   Allergies  Allergen Reactions  . Aspirin Other (See Comments)    Diverticulosis  . Lisinopril Cough     Past Surgical History:  Procedure Laterality Date  . ABDOMINAL HYSTERECTOMY    . BACK SURGERY  2010  . BREAST CYST ASPIRATION Right 1990's  . COLONOSCOPY Left 11/09/2017   Procedure: COLONOSCOPY;  Surgeon: Virgel Manifold, MD;  Location[facility] ENDOSCOPY;  Service: Endoscopy;  Laterality: Left;  . COLONOSCOPY WITH PROPOFOL N/A 04/14/2020   Procedure: COLONOSCOPY WITH PROPOFOL;  Surgeon: Virgel Manifold, MD;  Location: [facility] ENDOSCOPY;  Service: Endoscopy;  Laterality: N/A;  . CYSTO WITH HYDRODISTENSION N/A 08/15/2017   Procedure: CYSTOSCOPY/HYDRODISTENSION;  Surgeon: Nickie Retort, MD;  Location: [facility] ORS;  Service: Urology;  Laterality: N/A;  . ESOPHAGOGASTRODUODENOSCOPY Left 11/09/2017   Procedure: ESOPHAGOGASTRODUODENOSCOPY (EGD);  Surgeon: Virgel Manifold, MD;  Location: [facility] ENDOSCOPY;  Service: Endoscopy;  Laterality: Left;  . ESOPHAGOGASTRODUODENOSCOPY (EGD) WITH PROPOFOL N/A 02/22/2020   Procedure: ESOPHAGOGASTRODUODENOSCOPY (EGD) WITH PROPOFOL;  Surgeon: Lucilla Lame, MD;  Location: [facility] ENDOSCOPY;  Service: Endoscopy;  Laterality: N/A;  . FOOT SURGERY Right      Family History  Problem Relation Age of Onset  . Hypertension Brother   . Heart disease Brother   . Heart failure Brother   . Hypertension Mother   . Stroke Mother   . Stroke Father   . Prostate cancer Father   . Aneurysm Sister   . Breast cancer Sister 42  . Liver disease Maternal Aunt   . Heart failure Paternal Aunt   . Heart disease  Paternal Uncle   . Heart attack Brother   . Heart disease Brother   . Breast cancer Maternal Aunt   . Heart disease Paternal Aunt   . Kidney cancer Neg Hx   . Bladder Cancer Neg Hx      Social History   Tobacco Use  . Smoking status: Never Smoker  . Smokeless tobacco: Never Used  Substance Use Topics  . Alcohol use: No    Alcohol/week: 0.0 standard drinks    With staff assistance, above reviewed with the patient today.  ROS: As per HPI, otherwise no specific complaints on a limited and focused system review   Results for orders placed or performed in visit on 09/29/20 (from the past 72 hour(s))  POCT Urinalysis Dipstick     Status: Abnormal   Collection Time: 09/29/20 11:44 AM  Result Value Ref Range   Color, UA yellow    Clarity, UA cloudy    Glucose, UA Positive (A) Negative   Bilirubin, UA Negative    Ketones, UA Negative    Spec Grav,  UA 1.015 1.010 - 1.025   Blood, UA Positive     Comment: Moderate   pH, UA 7.0 5.0 - 8.0   Protein, UA Positive (A) Negative   Urobilinogen, UA 0.2 0.2 or 1.0 E.U./dL   Nitrite, UA Negative    Leukocytes, UA Moderate (2+) (A) Negative   Appearance     Odor       PHQ2/9: Depression screen [facility] 2/9 09/29/2020 06/30/2020 06/14/2020 02/15/2020 02/04/2020  Decreased Interest 0 0 0 0 0  Down, Depressed, Hopeless 0 0 0 0 0  PHQ - 2 Score 0 0 0 0 0  Altered sleeping - - 0 0 -  Tired, decreased energy - - 0 0 -  Change in appetite - - 0 0 -  Feeling bad or failure about yourself  - - 0 0 -  Trouble concentrating - - 0 0 -  Moving slowly or fidgety/restless - - 0 0 -  Suicidal thoughts - - 0 0 -  PHQ-9 Score - - 0 0 -  Difficult doing work/chores - - Not difficult at all - -  Some recent data might be hidden   PHQ-2/9 Result reviewed  Fall Risk: Fall Risk  09/29/2020 09/29/2020 06/30/2020 06/14/2020 02/15/2020  Falls in the past year? 0 0 0 0 0  Number falls in past yr: 0 0 - 0 -  Comment - - - - -  Injury with Fall? 0 0 - 0 0  Risk  for fall due to : - - - - -  Follow up - - Falls prevention discussed Falls evaluation completed -      Objective:   Vitals:   09/29/20 1126  BP: (!) 176/100  Pulse: 75  Resp: 16  Temp: 98 F (36.7 C)  TempSrc: Oral  SpO2: 98%  Weight: 197 lb 1.6 oz (89.4 kg)  Height: 5\' 6"  (1.676 m)    Body mass index is 31.81 kg/m.   Noted her blood pressure was slightly elevated, and may be elevated due to her recent pain with her UTI.  Recent blood pressures were 144/80 on her rheumatology visit 08/11/2020, at 142/84 on her pulmonary visit 08/17/2020.  Physical Exam   NAD, masked, pleasant HEENT - [place]/AT, sclera anicteric, PERRL, conj - non-inj'ed, Neck - supple, no adenopathy, Car - RRR without m/g/r Pulm- RR and effort normal at rest, CTA without wheeze or rales Abd - soft, NT with no tenderness in the suprapubic region to palpation, nondistended Back - no CVA tenderness Ext - no LE edema,  Neuro/psychiatric - affect was not flat, appropriate with conversation  Alert with normal speech  Results for orders placed or performed in visit on 09/29/20  POCT Urinalysis Dipstick  Result Value Ref Range   Color, UA yellow    Clarity, UA cloudy    Glucose, UA Positive (A) Negative   Bilirubin, UA Negative    Ketones, UA Negative    Spec Grav, UA 1.015 1.010 - 1.025   Blood, UA Positive    pH, UA 7.0 5.0 - 8.0   Protein, UA Positive (A) Negative   Urobilinogen, UA 0.2 0.2 or 1.0 E.U./dL   Nitrite, UA Negative    Leukocytes, UA Moderate (2+) (A) Negative   Appearance     Odor     Urine dip in the office above reviewed, positive blood, positive moderate leukocytes, cloudy in appearance    Assessment & Plan:    1. Dysuria/2. Acute cystitis with hematuria Noted to patient  concerns for a urinary tract infection with her symptoms and the urine dip noted above.  No fevers or flank pains.  No CVA tenderness on exam. We will send urine for UA and culture, and do feel initiating antibiotics  indicated. Bactrim DS-1 twice daily prescribed Noted to patient if the culture returns with an organism resistant to the above antibiotic, she will be notified and likely change the antibiotic at that time. Also, if her symptoms are not improving or more problematic despite the antibiotic initiation, she needs to follow-up Also recommended staying well-hydrated.  - POCT Urinalysis Dipstick - sulfamethoxazole-trimethoprim (BACTRIM DS) 800-160 MG tablet; Take 1 tablet by mouth 2 (two) times daily for 5 days.  Dispense: 10 tablet; Refill: 0 - Urinalysis, Complete - Urine Culture    3.  Essential hypertension  Her blood pressure was slightly high on check today, with recent readings at her last couple appointments reasonable as noted above. Do feel the discomfort with the infection in the past several days is a likely contributor. She is to follow-up with Holly Green for her hypertension, with her last visit in July noted, and has a follow-up scheduled in January 2022.   She is aware of the importance of following up if symptoms not improving or more problematic, as await the UA and culture results sent today.   Towanda Malkin, MD 09/29/20 12:00 PM

## 2020-10-02 LAB — URINALYSIS, COMPLETE
Bilirubin Urine: NEGATIVE
Glucose, UA: NEGATIVE
Ketones, ur: NEGATIVE
Nitrite: POSITIVE — AB
Specific Gravity, Urine: 1.01 (ref 1.001–1.03)
WBC, UA: >=60 /HPF — AB (ref 0–5)
pH: 7 (ref 5.0–8.0)

## 2020-10-02 LAB — URINE CULTURE
MICRO NUMBER:: 11167297
SPECIMEN QUALITY:: ADEQUATE

## 2020-10-02 NOTE — Telephone Encounter (Signed)
Error. Please disregard

## 2020-10-06 ENCOUNTER — Telehealth: Payer: Self-pay

## 2020-10-06 NOTE — Telephone Encounter (Signed)
  Chronic Care Management   Outreach Note  10/06/2020 Name: Holly Green MRN: [id number] DOB: 09-29-1946  Primary Care Provider: Delsa Grana, PA-C Reason for referral : Chronic Care Management   An unsuccessful telephone outreach was attempted today. [name] is currently enrolled in the chronic care management program.     Follow Up Plan:  A HIPAA compliant voice message was left today requesting a return call.    Cristy [facility] Care Management [facility] [phone]

## 2020-10-13 ENCOUNTER — Ambulatory Visit: Payer: Medicare Other

## 2020-10-13 DIAGNOSIS — M0609 Rheumatoid arthritis without rheumatoid factor, multiple sites: Secondary | ICD-10-CM

## 2020-10-13 DIAGNOSIS — J449 Chronic obstructive pulmonary disease, unspecified: Secondary | ICD-10-CM

## 2020-10-13 DIAGNOSIS — I10 Essential (primary) hypertension: Secondary | ICD-10-CM

## 2020-10-13 DIAGNOSIS — E782 Mixed hyperlipidemia: Secondary | ICD-10-CM

## 2020-10-13 NOTE — Chronic Care Management (AMB) (Signed)
Chronic Care Management   Follow Up Note   10/13/2020 Name: Holly Green MRN: [id number] DOB: 1945/12/17  Primary Care Provider: Delsa Grana, PA-C Reason for referral : Chronic Care Management  Holly Green is a 74 y.o. year old female who is a primary care patient of Delsa Grana, [place]. A routine telephonic outreach was conducted today. She has met her care management goals.  Review of Holly Green status, including review of consultants reports, relevant labs and test results was conducted today. Collaboration with appropriate care team members was performed as part of the comprehensive evaluation and provision of chronic care management services.    SDOH (Social Determinants of Health) assessments performed: No     Outpatient Encounter Medications as of 10/13/2020  Medication Sig  . acetaminophen (TYLENOL) 650 MG CR tablet Take 650-1,300 mg by mouth every 8 (eight) hours as needed for pain.  Marland Kitchen alendronate (FOSAMAX) 70 MG tablet Take 1 tablet (70 mg total) by mouth every Sunday. Take with a full glass of water on an empty stomach. (Patient not taking: Reported on 09/29/2020)  . bisoprolol-hydrochlorothiazide (ZIAC) 10-6.25 MG tablet Take 1 tablet by mouth daily.  . Calcium Carb-Cholecalciferol (CALCIUM-VITAMIN D) 600-400 MG-UNIT TABS Take 1 tablet by mouth daily.  . folic acid (FOLVITE) 1 MG tablet Take 1 mg by mouth daily.  . hydroxychloroquine (PLAQUENIL) 200 MG tablet Take 1 tablet by mouth in the morning and at bedtime. (Patient not taking: Reported on 09/29/2020)  . loratadine (CLARITIN) 10 MG tablet Take 1 tablet (10 mg total) by mouth daily.  Marland Kitchen losartan (COZAAR) 50 MG tablet Take 1 tablet (50 mg total) by mouth daily.  Marland Kitchen lovastatin (MEVACOR) 40 MG tablet Take 1 tablet (40 mg total) by mouth at bedtime.  . methotrexate 2.5 MG tablet Take 6 tablets (15 mg total) by mouth every 7 (seven) days All on the same day  . polyethylene glycol (MIRALAX) 17 g packet Take 17 g by mouth  daily.   No facility-administered encounter medications on file as of 10/13/2020.     Goals Addressed            This Visit's Progress   . COMPLETED: Chronic Disease Management       CARE PLAN ENTRY (see longitudinal plan of care for additional care plan information)  Current Barriers:  . Chronic Disease Management support and education needs related to HTN, HLD, GERD and Chronic Obstructive Airway Disease.  Case Manager Clinical Goal(s):  Over the next 120 days, patient will: . Not require hospitalization or emergent care d/t complications r/t chronic illnesses. . Continue take all medications as prescribed. . Attend all scheduled medical appointments. . Monitor blood pressure daily and record readings. . Follow recommended safety precautions to prevent falls and injuries. Over the next 30 days, patient will: . Update provider if edema to right hand worsens.   Interventions:  . Inter-disciplinary care team collaboration (see longitudinal plan of care) . Reviewed medications and compliance with treatment recommendations. Reports discontinuing Plaquenil and taking Vitamin D/Calcium supplements as instructed by her provider. Taking other medications as prescribed. Denies concerns regarding medication management or prescription costs. Reports BP readings have been within the established range. Reports symptoms related to obstructive airway disease are well controlled. Denies episodes of shortness of breath or decline in activity tolerance.   . Reviewed safety and fall prevention measures. Reports ambulating well. She remains independent. She continues to experience bilateral knee stiffness. Reports using pain relief spray and Voltaren gel as  needed. She recently noted occasional "heaviness" in her lower extremities. Denies edema or discoloration to her extremities. Reports extremities have not been warm or painful to touch. Reports the symptoms have not impacted her ability to ambulate  and only occur when she is lying down. We discussed worsening s/sx that require immediate medical attention. Denies need for urgent evaluation but plans to discuss this during her next Rheumatology visit. Denies falls.   . Discussed care management needs. Holly Green reports doing well. She remains independent with good family support. Denies urgent concerns or changes in care management needs. Her care management goals have been met. She agreed to contact the team directly if her health changes and additional assistance is needed.   Patient Self Care Activities:  . Self administers medications  . Attends scheduled provider appointments . Calls pharmacy for medication refills . Attends church or other social activities . Performs ADL's independently . Performs IADL's independently   Please see past updates related to this goal by clicking on the "Past Updates" button in the selected goal         PLAN Holly Green has met her care management goals. Agreed to call if her health status changes and additional assistance is required. The care management team will gladly assist.    Horris Latino [facility] Care Management [facility] [phone]

## 2020-10-25 NOTE — Patient Instructions (Signed)
Thank you for allowing the Chronic Care Management team to participate in your care.  Goals Addressed            This Visit's Progress   . COMPLETED: Chronic Disease Management       CARE PLAN ENTRY (see longitudinal plan of care for additional care plan information)  Current Barriers:  . Chronic Disease Management support and education needs related to HTN, HLD, GERD and Chronic Obstructive Airway Disease.  Case Manager Clinical Goal(s):  Over the next 120 days, patient will: . Not require hospitalization or emergent care d/t complications r/t chronic illnesses. . Continue take all medications as prescribed. . Attend all scheduled medical appointments. . Monitor blood pressure daily and record readings. . Follow recommended safety precautions to prevent falls and injuries. Over the next 30 days, patient will: . Update provider if edema to right hand worsens.   Interventions:  . Inter-disciplinary care team collaboration (see longitudinal plan of care) . Reviewed medications and compliance with treatment recommendations. Reports discontinuing Plaquenil and taking Vitamin D/Calcium supplements as instructed by her provider. Taking other medications as prescribed. Denies concerns regarding medication management or prescription costs. Reports BP readings have been within the established range. Reports symptoms related to obstructive airway disease are well controlled. Denies episodes of shortness of breath or decline in activity tolerance.   . Reviewed safety and fall prevention measures. Reports ambulating well. She remains independent. She continues to experience bilateral knee stiffness. Reports using pain relief spray and Voltaren gel as needed. She recently noted occasional "heaviness" in her lower extremities. Denies edema or discoloration to her extremities. Reports extremities have not been warm or painful to touch. Reports the symptoms have not impacted her ability to ambulate and  only occur when she is lying down. We discussed worsening s/sx that require immediate medical attention. Denies need for urgent evaluation but plans to discuss this during her next Rheumatology visit. Denies falls.   . Discussed care management needs. Ms. Fleisher reports doing well. She remains independent with good family support. Denies urgent concerns or changes in care management needs. Her care management goals have been met. She agreed to contact the team directly if her health changes and additional assistance is needed.   Patient Self Care Activities:  . Self administers medications  . Attends scheduled provider appointments . Calls pharmacy for medication refills . Attends church or other social activities . Performs ADL's independently . Performs IADL's independently   Please see past updates related to this goal by clicking on the "Past Updates" button in the selected goal        Ms. Bogue verbalized understanding of the information discussed during the telephonic outreach today. Declined need for a mailed/printed copy of the information.    Ms. Navis has met her care management goals. Agreed to call if her health status changes and additional assistance is required. The care management team will gladly assist.    Horris Latino [facility] Care Management [facility] [phone]

## 2020-12-15 ENCOUNTER — Ambulatory Visit: Payer: Medicare Other | Admitting: Family Medicine

## 2020-12-29 ENCOUNTER — Encounter: Payer: Self-pay | Admitting: Family Medicine

## 2020-12-29 ENCOUNTER — Other Ambulatory Visit: Payer: Self-pay

## 2020-12-29 ENCOUNTER — Ambulatory Visit (INDEPENDENT_AMBULATORY_CARE_PROVIDER_SITE_OTHER): Payer: Medicare Other | Admitting: Family Medicine

## 2020-12-29 VITALS — BP 132/80 | HR 68 | Temp 98.1°F | Resp 16 | Ht 66.0 in | Wt 194.1 lb

## 2020-12-29 DIAGNOSIS — Z79899 Other long term (current) drug therapy: Secondary | ICD-10-CM

## 2020-12-29 DIAGNOSIS — M0609 Rheumatoid arthritis without rheumatoid factor, multiple sites: Secondary | ICD-10-CM | POA: Diagnosis not present

## 2020-12-29 DIAGNOSIS — D509 Iron deficiency anemia, unspecified: Secondary | ICD-10-CM | POA: Diagnosis not present

## 2020-12-29 DIAGNOSIS — K219 Gastro-esophageal reflux disease without esophagitis: Secondary | ICD-10-CM

## 2020-12-29 DIAGNOSIS — M810 Age-related osteoporosis without current pathological fracture: Secondary | ICD-10-CM | POA: Diagnosis not present

## 2020-12-29 DIAGNOSIS — J449 Chronic obstructive pulmonary disease, unspecified: Secondary | ICD-10-CM

## 2020-12-29 DIAGNOSIS — I700 Atherosclerosis of aorta: Secondary | ICD-10-CM

## 2020-12-29 DIAGNOSIS — E669 Obesity, unspecified: Secondary | ICD-10-CM

## 2020-12-29 DIAGNOSIS — R7303 Prediabetes: Secondary | ICD-10-CM

## 2020-12-29 DIAGNOSIS — E782 Mixed hyperlipidemia: Secondary | ICD-10-CM

## 2020-12-29 DIAGNOSIS — E559 Vitamin D deficiency, unspecified: Secondary | ICD-10-CM | POA: Diagnosis not present

## 2020-12-29 DIAGNOSIS — Z5181 Encounter for therapeutic drug level monitoring: Secondary | ICD-10-CM | POA: Diagnosis not present

## 2020-12-29 DIAGNOSIS — I10 Essential (primary) hypertension: Secondary | ICD-10-CM

## 2020-12-29 NOTE — Progress Notes (Signed)
Name: Holly Green   MRN: [id number]    DOB: 1946/08/12   Date:12/29/2020       Progress Note  Chief Complaint  Patient presents with  . Hypertension  . Hyperlipidemia    Follow up     Subjective:   Holly Green is a 75 y.o. female, presents to clinic for routine f/up  Osteoporosis/osteopenia-  Bone Density shows improving bone density - (aka bones got stronger) Past result note r/t Dexa: From what I can see she has been on fosamax for 5 years and she would not continue this medications after 5 years. Can repeat Dexa in 2-3 years to monitor - some women resume fosamax after a 3 year break   She should continue supplements daily - D 3 and Calcium 1000 IU and 1200 mg  Please call and notify pt of results   Osteoporosis - Most recent dexa shows improved spine bone density osteoporosis ---> osteopenia, completed 5 years alendronate 2016 to current - d/c meds - no longer taking fosamax F/up dexa in 2-3 years  Stopped fosamax - increased her supplement dose Spine density improved a little, femur and radius density worsened slightly   prolia - discussed options to get with specialists vs monitoring    Hypertension:  Currently managed on losartan 50 mg and bisoprolol HCTZ 10-6 0.25 Pt reports good med compliance and denies any SE.   Blood pressure today is well controlled. BP Readings from Last 3 Encounters:  12/29/20 132/80  09/29/20 (!) 176/100  09/29/20 (!) 172/89   Pt denies CP, SOB, exertional sx, LE edema, palpitation, Ha's, visual disturbances, lightheadedness, hypotension, syncope. Dietary efforts for BP?  Working on [employer] and remaining active   Hyperlipidemia: Currently treated with lovastatin 40 mg daily, pt reports good med compliance-we reviewed her last labs which were at goal no changes Last Lipids: Lab Results  Component Value Date   CHOL 178 06/14/2020   HDL 82 06/14/2020   LDLCALC 84 06/14/2020   TRIG 50 06/14/2020   CHOLHDL 2.2  06/14/2020  She denies any chest pain, claudication type symptoms  Patient reports monitoring with Dr. Tasia Catchings her iron deficiency anemia she does have follow-up with him next week for labs and for a follow-up office visit -I have reached out to them regarding combining her blood work today -labs will be routed to them and she can follow-up on anemia and iron supplement with her specialist.  Patient sees Dr. Posey Pronto with [facility] for managing her rheumatoid arthritis chloroquine 9 was stopped but she is continue to take methotrexate and folic acid supplement -she does endorse some worsening joint pain and arthritis with weather changes.  She is following up with him and deciding about starting a new medication which is an injection -I have reviewed her most recent office visits labs and follow-up plan through care everywhere from 10/17/2020 to 10/25/2020 she is following up to see if she will start Cimzia infusion medication for RA She currently confirms she has continued to take methotrexate and folic acid and she is up-to-date on her eye exam     Patient Active Problem List   Diagnosis Date Noted  . Acute cystitis with hematuria 09/29/2020  . Chronic cough 08/17/2020  . Rheumatoid arthritis of multiple sites with negative rheumatoid factor ([place]) 06/09/2020  . Special screening for malignant neoplasms, colon   . Abnormal CT scan, gastrointestinal tract   . Gastrointestinal hemorrhage 12/29/2019  . Left hip pain 10/15/2019  . Obesity (  BMI 30.0-34.9) 08/10/2018  . Emphysema of lung ([place]) 08/10/2018  . Allergic rhinitis 08/10/2018  . COAD (chronic obstructive airways disease) ([place]) 08/03/2018  . Primary osteoarthritis of right knee 07/17/2018  . Chondrocalcinosis 07/17/2018  . 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 Anemia 12/17/2017  . Diverticulosis of large intestine without diverticulitis   . History of GI bleed 11/06/2017  . Left flank pain,  chronic 03/07/2017  . Spondylolisthesis at L4-L5 level 03/07/2017  . Abdominal aortic atherosclerosis ([place]) 03/05/2017  . Simple cyst of kidney 03/05/2017  . Benign liver cyst 03/05/2017  . Medication monitoring encounter 11/22/2016  . GERD (gastroesophageal reflux disease) 08/07/2016  . Osteoporosis 05/07/2016  . Cardiac murmur 05/07/2016  . Prediabetes 10/15/2015  . Hyperlipidemia 10/15/2015  . Essential hypertension 10/15/2015  . Diverticulosis of colon, acquired 10/15/2015    Past Surgical History:  Procedure Laterality Date  . ABDOMINAL HYSTERECTOMY    . BACK SURGERY  2010  . BREAST CYST ASPIRATION Right 1990's  . COLONOSCOPY Left 11/09/2017   Procedure: COLONOSCOPY;  Surgeon: Virgel Manifold, MD;  Location: [facility] ENDOSCOPY;  Service: Endoscopy;  Laterality: Left;  . COLONOSCOPY WITH PROPOFOL N/A 04/14/2020   Procedure: COLONOSCOPY WITH PROPOFOL;  Surgeon: Virgel Manifold, MD;  Location: [facility] ENDOSCOPY;  Service: Endoscopy;  Laterality: N/A;  . CYSTO WITH HYDRODISTENSION N/A 08/15/2017   Procedure: CYSTOSCOPY/HYDRODISTENSION;  Surgeon: Nickie Retort, MD;  Location: [facility] ORS;  Service: Urology;  Laterality: N/A;  . ESOPHAGOGASTRODUODENOSCOPY Left 11/09/2017   Procedure: ESOPHAGOGASTRODUODENOSCOPY (EGD);  Surgeon: Virgel Manifold, MD;  Location: [facility] ENDOSCOPY;  Service: Endoscopy;  Laterality: Left;  . ESOPHAGOGASTRODUODENOSCOPY (EGD) WITH PROPOFOL N/A 02/22/2020   Procedure: ESOPHAGOGASTRODUODENOSCOPY (EGD) WITH PROPOFOL;  Surgeon: Lucilla Lame, MD;  Location: [facility] ENDOSCOPY;  Service: Endoscopy;  Laterality: N/A;  . FOOT SURGERY Right     Family History  Problem Relation Age of Onset  . Hypertension Brother   . Heart disease Brother   . Heart failure Brother   . Hypertension Mother   . Stroke Mother   . Stroke Father   . Prostate cancer Father   . Aneurysm Sister   . Breast cancer Sister 28  . Liver disease Maternal Aunt   . Heart failure Paternal  Aunt   . Heart disease Paternal Uncle   . Heart attack Brother   . Heart disease Brother   . Breast cancer Maternal Aunt   . Heart disease Paternal Aunt   . Kidney cancer Neg Hx   . Bladder Cancer Neg Hx     Social History   Tobacco Use  . Smoking status: Never Smoker  . Smokeless tobacco: Never Used  Vaping Use  . Vaping Use: Never used  Substance Use Topics  . Alcohol use: No    Alcohol/week: 0.0 standard drinks  . Drug use: No     Allergies  Allergen Reactions  . Aspirin Other (See Comments)    Diverticulosis  . Lisinopril Cough    Health Maintenance  Topic Date Due  . MAMMOGRAM  08/21/2021  . TETANUS/TDAP  06/15/2022  . DEXA SCAN  08/21/2022  . COLONOSCOPY (Pts 45-44yrs Insurance coverage will need to be confirmed)  04/14/2025  . INFLUENZA VACCINE  Completed  . COVID-19 Vaccine  Completed  . Hepatitis C Screening  Completed  . PNA vac Low Risk Adult  Completed    Chart Review Today: I personally reviewed active problem list, medication list, allergies, family history, social history, health maintenance, notes  from last encounter, lab results, imaging with the patient/caregiver today.   Review of Systems  Constitutional: Negative.   HENT: Negative.   Eyes: Negative.   Respiratory: Negative.   Cardiovascular: Negative.   Gastrointestinal: Negative.   Endocrine: Negative.   Genitourinary: Negative.   Musculoskeletal: Negative.   Skin: Negative.   Allergic/Immunologic: Negative.   Neurological: Negative.   Hematological: Negative.   Psychiatric/Behavioral: Negative.   All other systems reviewed and are negative.    Objective:   Vitals:   12/29/20 1003  BP: 132/80  Pulse: 68  Resp: 16  Temp: 98.1 F (36.7 C)  TempSrc: Oral  SpO2: 99%  Weight: 194 lb 1.6 oz (88 kg)  Height: 5\' 6"  (1.676 m)    Body mass index is 31.33 kg/m.  Physical Exam Vitals and nursing note reviewed.  Constitutional:      General: She is not in acute distress.     Appearance: Normal appearance. She is well-developed and well-groomed. She is obese. She is not ill-appearing, toxic-appearing or diaphoretic.     Comments: Well appearing elderly female, appears stated age  HENT:     Head: Normocephalic and atraumatic.     Right Ear: External ear normal.     Left Ear: External ear normal.  Eyes:     General: No scleral icterus.       Right eye: No discharge.        Left eye: No discharge.     Conjunctiva/sclera: Conjunctivae normal.  Cardiovascular:     Rate and Rhythm: Normal rate and regular rhythm.     Pulses: Normal pulses.     Heart sounds: Normal heart sounds. No murmur heard. No friction rub. No gallop.   Pulmonary:     Effort: Pulmonary effort is normal. No respiratory distress.     Breath sounds: Normal breath sounds. No stridor. No wheezing or rales.  Abdominal:     General: Bowel sounds are normal.     Palpations: Abdomen is soft.  Musculoskeletal:     Right lower leg: No edema.     Left lower leg: No edema.  Skin:    General: Skin is warm and dry.     Capillary Refill: Capillary refill takes less than 2 seconds.     Coloration: Skin is not jaundiced or pale.     Findings: No rash.  Neurological:     Mental Status: She is alert. Mental status is at baseline.  Psychiatric:        Mood and Affect: Mood normal.        Behavior: Behavior normal. Behavior is cooperative.         Assessment & Plan:     ICD-10-CM   1. Essential hypertension  99991111 COMPLETE METABOLIC PANEL WITH GFR   Stable, well-controlled, BP at goal today, recheck labs and continue same medication  2. Chronic obstructive pulmonary disease, unspecified COPD type ([place])  J44.9    Patient denies any symptoms she is not having any dyspnea on exertion and her activity is not limited by shortness of breath  3. Mixed hyperlipidemia  99991111 COMPLETE METABOLIC PANEL WITH GFR   Good statin compliance, no side effects myalgias or concerns reviewed last lipid panel,  well-controlled will check lipids annually today we will screen CMP  4. Rheumatoid arthritis of multiple sites with negative rheumatoid factor (H[facility])  M06.09 CBC with Differential/Platelet    COMPLETE METABOLIC PANEL WITH GFR   Per rheumatology -recent encounters labs medication changes all reviewed  personally by me today  5. Age-related osteoporosis without current pathological fracture  J00.9 COMPLETE METABOLIC PANEL WITH GFR    VITAMIN D 25 Hydroxy (Vit-D Deficiency, Fractures)   We reviewed her past DEXA and options for management -DEXA repeat next year, could possibly consult for Prolia injections since she has completed Fosamax x5 yr  6. Abdominal aortic atherosclerosis (HCC)  F81.8 COMPLETE METABOLIC PANEL WITH GFR   On statin, good compliance, monitoring  7. Iron deficiency anemia, unspecified iron deficiency anemia type  D50.9 CBC with Differential/Platelet    Iron, TIBC and Ferritin Panel   We will do labs today while she is here she has follow-up next week with hematology -results will be routed to them -reached out personally to Dr. Tasia Catchings and team  8. Prediabetes  E99.37 COMPLETE METABOLIC PANEL WITH GFR    Hemoglobin A1c   She is working on [employer] -recheck and monitor A1c, explain goal is to keep optimally under 7.0 but for her age would also be fine with slightly higher A1c  9. Obesity (BMI 30.0-34.9)  E66.9    BMI elevated with multiple comorbidities including HTN, HLD, chronic knee pain - encouraged continued healthy diet and remaining active as able   10. Gastroesophageal reflux disease, unspecified whether esophagitis present  K21.9    She reports her symptoms are well controlled with avoiding food triggers and using over-the-counter medications only as needed  11. Vitamin D deficiency  J69.6 COMPLETE METABOLIC PANEL WITH GFR    VITAMIN D 25 Hydroxy (Vit-D Deficiency, Fractures)   She has recently increased her over-the-counter supplement we will recheck labs today with her  history of osteoporosis  12. Long-term use of high-risk medication  Z79.899    monitoring with RA/methotrexate, on folic acid supplement, she has f/up with rheumatology and eye exam   13. Medication monitoring encounter  Z51.81 CBC with Differential/Platelet    COMPLETE METABOLIC PANEL WITH GFR    Hemoglobin A1c    VITAMIN D 25 Hydroxy (Vit-D Deficiency, Fractures)    Iron, TIBC and Ferritin Panel   - will route labs to hematology - Dr. Tasia Catchings  Consider consult for prolia injection and further tx of osteoporosis since she completed fosamax x 5 years  Hem/onc? Vs rheumatology or endo?  Will check with her current specialists to see if either of them are willing to address - and then will update pt on plan.  Return in about 6 months (around 06/28/2021) for HTN, HLD, prediabetes f/up.   Delsa Grana, PA-C 12/29/20 10:33 AM

## 2020-12-29 NOTE — Patient Instructions (Addendum)
I will contract you about whether or not we set up follow up with a specialists for your osteoporosis - to consult regarding prolia shots  Labs today will be forwarded to Dr. Tasia Catchings   Please call them on Monday to see if you can cancel the lab appointment and just go for the follow up office appointment.  Health Maintenance  Topic Date Due  . Mammogram  08/21/2021  . Tetanus Vaccine  06/15/2022  . DEXA scan (bone density measurement)  08/21/2022  . Colon Cancer Screening  04/14/2025  . Flu Shot  Completed  . COVID-19 Vaccine  Completed  .  Hepatitis C: One time screening is recommended by Center for Disease Control  (CDC) for  adults born from [birth year] through 1964.   Completed  . Pneumonia vaccines  Completed

## 2020-12-30 LAB — CBC WITH DIFFERENTIAL/PLATELET
Absolute Monocytes: 316 cells/uL (ref 200–950)
Basophils Absolute: 21 cells/uL (ref 0–200)
Basophils Relative: 0.5 %
Eosinophils Absolute: 201 cells/uL (ref 15–500)
Eosinophils Relative: 4.9 %
HCT: 34.9 % — ABNORMAL LOW (ref 35.0–45.0)
Hemoglobin: 11.8 g/dL (ref 11.7–15.5)
Lymphs Abs: 1365 cells/uL (ref 850–3900)
MCH: 31.1 pg (ref 27.0–33.0)
MCHC: 33.8 g/dL (ref 32.0–36.0)
MCV: 91.8 fL (ref 80.0–100.0)
MPV: 9 fL (ref 7.5–12.5)
Monocytes Relative: 7.7 %
Neutro Abs: 2198 cells/uL (ref 1500–7800)
Neutrophils Relative %: 53.6 %
Platelets: 222 10*3/uL (ref 140–400)
RBC: 3.8 10*6/uL (ref 3.80–5.10)
RDW: 13.2 % (ref 11.0–15.0)
Total Lymphocyte: 33.3 %
WBC: 4.1 10*3/uL (ref 3.8–10.8)

## 2020-12-30 LAB — HEMOGLOBIN A1C
Hgb A1c MFr Bld: 5.9 % of total Hgb — ABNORMAL HIGH (ref <5.7)
Mean Plasma Glucose: 123 mg/dL
eAG (mmol/L): 6.8 mmol/L

## 2020-12-30 LAB — IRON,TIBC AND FERRITIN PANEL
%SAT: 20 % (calc) (ref 16–45)
Ferritin: 60 ng/mL (ref 16–288)
Iron: 70 ug/dL (ref 45–160)
TIBC: 346 mcg/dL (calc) (ref 250–450)

## 2020-12-30 LAB — COMPLETE METABOLIC PANEL WITH GFR
AG Ratio: 1.7 (calc) (ref 1.0–2.5)
ALT: 14 U/L (ref 6–29)
AST: 23 U/L (ref 10–35)
Albumin: 4.2 g/dL (ref 3.6–5.1)
Alkaline phosphatase (APISO): 84 U/L (ref 37–153)
BUN: 17 mg/dL (ref 7–25)
CO2: 26 mmol/L (ref 20–32)
Calcium: 9.3 mg/dL (ref 8.6–10.4)
Chloride: 104 mmol/L (ref 98–110)
Creat: 0.73 mg/dL (ref 0.60–0.93)
GFR, Est African American: 95 mL/min/{1.73_m2} (ref >=60)
GFR, Est Non African American: 82 mL/min/{1.73_m2} (ref >=60)
Globulin: 2.5 g/dL (calc) (ref 1.9–3.7)
Glucose, Bld: 76 mg/dL (ref 65–99)
Potassium: 3.7 mmol/L (ref 3.5–5.3)
Sodium: 142 mmol/L (ref 135–146)
Total Bilirubin: 1.9 mg/dL — ABNORMAL HIGH (ref 0.2–1.2)
Total Protein: 6.7 g/dL (ref 6.1–8.1)

## 2020-12-30 LAB — VITAMIN D 25 HYDROXY (VIT D DEFICIENCY, FRACTURES): Vit D, 25-Hydroxy: 44 ng/mL (ref 30–100)

## 2021-01-01 ENCOUNTER — Encounter: Payer: Self-pay | Admitting: Family Medicine

## 2021-01-02 ENCOUNTER — Telehealth: Payer: Self-pay | Admitting: *Deleted

## 2021-01-02 NOTE — Telephone Encounter (Signed)
Pt doesn't need labs drawn on 01/03/21 per Dr.Yu.She had them drawn at PCP on 2/4 (results available in chart). 01/03/21 labs were cx. As requested A detailed message was left on pts VM making her aware. of her 01/03/21 lab appt being cx. However, She will RTC on 01/05/21 to see MD as sched

## 2021-01-03 ENCOUNTER — Inpatient Hospital Stay: Payer: Medicare Other

## 2021-01-03 NOTE — Telephone Encounter (Signed)
Patient informed that she does not need to come for labs today but keep the MD appt on 2/11.

## 2021-01-05 ENCOUNTER — Inpatient Hospital Stay: Payer: Medicare Other | Attending: Oncology | Admitting: Oncology

## 2021-01-05 ENCOUNTER — Encounter: Payer: Self-pay | Admitting: Oncology

## 2021-01-05 VITALS — BP 145/90 | HR 94 | Temp 98.0°F | Wt 196.2 lb

## 2021-01-05 DIAGNOSIS — R5383 Other fatigue: Secondary | ICD-10-CM | POA: Insufficient documentation

## 2021-01-05 DIAGNOSIS — R0609 Other forms of dyspnea: Secondary | ICD-10-CM | POA: Diagnosis not present

## 2021-01-05 DIAGNOSIS — K5730 Diverticulosis of large intestine without perforation or abscess without bleeding: Secondary | ICD-10-CM | POA: Insufficient documentation

## 2021-01-05 DIAGNOSIS — K219 Gastro-esophageal reflux disease without esophagitis: Secondary | ICD-10-CM | POA: Insufficient documentation

## 2021-01-05 DIAGNOSIS — E785 Hyperlipidemia, unspecified: Secondary | ICD-10-CM | POA: Diagnosis not present

## 2021-01-05 DIAGNOSIS — M1990 Unspecified osteoarthritis, unspecified site: Secondary | ICD-10-CM | POA: Diagnosis not present

## 2021-01-05 DIAGNOSIS — Z79899 Other long term (current) drug therapy: Secondary | ICD-10-CM | POA: Insufficient documentation

## 2021-01-05 DIAGNOSIS — D509 Iron deficiency anemia, unspecified: Secondary | ICD-10-CM | POA: Diagnosis not present

## 2021-01-05 DIAGNOSIS — I10 Essential (primary) hypertension: Secondary | ICD-10-CM | POA: Insufficient documentation

## 2021-01-05 NOTE — Progress Notes (Signed)
Patient denies new problems/concerns today.   °

## 2021-01-05 NOTE — Progress Notes (Signed)
Hematology/Oncology follow-up note [facility] Telephone:([phone] [phone]   Patient Care Team: Delsa Grana, PA-C as PCP - General (Family Medicine) Marlowe Sax, MD as Referring Physician (Rheumatology) Virgel Manifold, MD as Consulting Physician (Gastroenterology) Earlie Server, MD as Consulting Physician (Hematology and Oncology)  REFERRING PROVIDER: Delsa Grana, PA-C CHIEF COMPLAINTS/REASON FOR VISIT:  Follow-up in iron deficiency anemia  HISTORY OF PRESENTING ILLNESS:  Holly Green is a  75 y.o.  female with PMH listed below was seen in consultation at the request of Delsa Grana, PA-C   for evaluation of iron deficiency anemia.  Patient has a remote history of diverticula bleeding status post positive bleeding scan and embolization 2009 and 2015. Patient was admitted from 12/29/2019-12/31/2019 due to GI bleeding.  Patient had a 2 g hemoglobin drop over the past 2 months prior to admission.  Hemoglobin was 10.  Iron panel showed iron deficiency with ferritin off 32 and iron saturation at 11.  Conservative measures was recommended by gastroenterology.  Patient was discharged. 02/22/2020, EGD showed small hiatal hernia, normal stomach and duodenum.  No specimen was collected. 11/09/2017, colonoscopy showed: Diverticulosis.  Otherwise normal. No recent blood work after February was done. Patient is currently taking oral iron supplementation . Associated signs and symptoms: Patient reports fatigue.  SOB with exertion.  Denies weight loss, easy bruising, hematochezia, hemoptysis, hematuria.  INTERVAL HISTORY Holly Green is a 75 y.o. female who has above history reviewed by me today presents for follow up visit for management of anemia Problems and complaints are listed below: Patient has had blood work done at primary care provider's office.  She has no new complaints.  Patient takes oral iron supplementation daily.  She cannot recall  the name of her prescribed iron supplementation prescription.   Review of Systems  Constitutional: Negative for appetite change, chills, fatigue and fever.  HENT:   Negative for hearing loss and voice change.   Eyes: Negative for eye problems.  Respiratory: Negative for chest tightness and cough.   Cardiovascular: Negative for chest pain.  Gastrointestinal: Negative for abdominal distention, abdominal pain and blood in stool.  Endocrine: Negative for hot flashes.  Genitourinary: Negative for difficulty urinating and frequency.   Musculoskeletal: Negative for arthralgias.  Skin: Negative for itching and rash.  Neurological: Negative for extremity weakness.  Hematological: Negative for adenopathy.  Psychiatric/Behavioral: Negative for confusion.    MEDICAL HISTORY:  Past Medical History:  Diagnosis Date  . Abdominal aortic atherosclerosis ([place]) 03/05/2017   CT scan April 2018  . Arthritis   . Bleeding disorder ([place])   . Diverticulitis 2015  . GERD (gastroesophageal reflux disease)   . History of kidney stones   . History of stomach ulcers   . Hyperlipidemia   . Hypertension   . Osteoporosis   . Pre-diabetes   . Prediabetes 10/15/2015  . Spondylolisthesis at L4-L5 level 03/07/2017    SURGICAL HISTORY: Past Surgical History:  Procedure Laterality Date  . ABDOMINAL HYSTERECTOMY    . BACK SURGERY  2010  . BREAST CYST ASPIRATION Right 1990's  . COLONOSCOPY Left 11/09/2017   Procedure: COLONOSCOPY;  Surgeon: Virgel Manifold, MD;  Location: [facility] ENDOSCOPY;  Service: Endoscopy;  Laterality: Left;  . COLONOSCOPY WITH PROPOFOL N/A 04/14/2020   Procedure: COLONOSCOPY WITH PROPOFOL;  Surgeon: Virgel Manifold, MD;  Location: [facility] ENDOSCOPY;  Service: Endoscopy;  Laterality: N/A;  . CYSTO WITH HYDRODISTENSION N/A 08/15/2017   Procedure: CYSTOSCOPY/HYDRODISTENSION;  Surgeon: Nickie Retort, MD;  Location: [facility] ORS;  Service: Urology;  Laterality: N/A;  .  ESOPHAGOGASTRODUODENOSCOPY Left 11/09/2017   Procedure: ESOPHAGOGASTRODUODENOSCOPY (EGD);  Surgeon: Virgel Manifold, MD;  Location: [facility] ENDOSCOPY;  Service: Endoscopy;  Laterality: Left;  . ESOPHAGOGASTRODUODENOSCOPY (EGD) WITH PROPOFOL N/A 02/22/2020   Procedure: ESOPHAGOGASTRODUODENOSCOPY (EGD) WITH PROPOFOL;  Surgeon: Lucilla Lame, MD;  Location: [facility] ENDOSCOPY;  Service: Endoscopy;  Laterality: N/A;  . FOOT SURGERY Right     SOCIAL HISTORY: Social History   Socioeconomic History  . Marital status: Single    Spouse name: Not on file  . Number of children: 1  . Years of education: Not on file  . Highest education level: High school graduate  Occupational History  . Occupation: retired  Tobacco Use  . Smoking status: Never Smoker  . Smokeless tobacco: Never Used  Vaping Use  . Vaping Use: Never used  Substance and Sexual Activity  . Alcohol use: No    Alcohol/week: 0.0 standard drinks  . Drug use: No  . Sexual activity: Never  Other Topics Concern  . Not on file  Social History Narrative  . Not on file   Social Determinants of Health   Financial Resource Strain: Low Risk   . Difficulty of Paying Living Expenses: Not hard at all  Food Insecurity: No Food Insecurity  . Worried About Charity fundraiser in the Last Year: Never true  . Ran Out of Food in the Last Year: Never true  Transportation Needs: No Transportation Needs  . Lack of Transportation (Medical): No  . Lack of Transportation (Non-Medical): No  Physical Activity: Inactive  . Days of Exercise per Week: 0 days  . Minutes of Exercise per Session: 0 min  Stress: No Stress Concern Present  . Feeling of Stress : Not at all  Social Connections: Moderately Integrated  . Frequency of Communication with Friends and Family: More than three times a week  . Frequency of Social Gatherings with Friends and Family: Once a week  . Attends Religious Services: More than 4 times per year  . Active Member of Clubs or  Organizations: Yes  . Attends Archivist Meetings: Never  . Marital Status: Never married  Intimate Partner Violence: Not At Risk  . Fear of Current or Ex-Partner: No  . Emotionally Abused: No  . Physically Abused: No  . Sexually Abused: No    FAMILY HISTORY: Family History  Problem Relation Age of Onset  . Hypertension Brother   . Heart disease Brother   . Heart failure Brother   . Hypertension Mother   . Stroke Mother   . Stroke Father   . Prostate cancer Father   . Aneurysm Sister   . Breast cancer Sister 36  . Liver disease Maternal Aunt   . Heart failure Paternal Aunt   . Heart disease Paternal Uncle   . Heart attack Brother   . Heart disease Brother   . Breast cancer Maternal Aunt   . Heart disease Paternal Aunt   . Kidney cancer Neg Hx   . Bladder Cancer Neg Hx     ALLERGIES:  is allergic to aspirin and lisinopril.  MEDICATIONS:  Current Outpatient Medications  Medication Sig Dispense Refill  . acetaminophen (TYLENOL) 650 MG CR tablet Take 650-1,300 mg by mouth every 8 (eight) hours as needed for pain.    . bisoprolol-hydrochlorothiazide (ZIAC) 10-6.25 MG tablet Take 1 tablet by mouth daily. 90 tablet 3  . Calcium Carb-Cholecalciferol (CALCIUM-VITAMIN D) 600-400 MG-UNIT TABS Take  1 tablet by mouth daily.    . folic acid (FOLVITE) 1 MG tablet Take 1 mg by mouth daily.    Marland Kitchen loratadine (CLARITIN) 10 MG tablet Take 1 tablet (10 mg total) by mouth daily. 30 tablet 11  . losartan (COZAAR) 50 MG tablet Take 1 tablet (50 mg total) by mouth daily. 90 tablet 3  . lovastatin (MEVACOR) 40 MG tablet Take 1 tablet (40 mg total) by mouth at bedtime. 90 tablet 3  . methotrexate 2.5 MG tablet Take 6 tablets (15 mg total) by mouth every 7 (seven) days All on the same day    . polyethylene glycol (MIRALAX) 17 g packet Take 17 g by mouth daily. 30 each 2   No current facility-administered medications for this visit.     PHYSICAL EXAMINATION: ECOG PERFORMANCE STATUS:  1 - Symptomatic but completely ambulatory Vitals:   01/05/21 1022  BP: (!) 145/90  Pulse: 94  Temp: 98 F (36.7 C)  SpO2: 98%   Filed Weights   01/05/21 1022  Weight: 196 lb 3 oz (89 kg)    Physical Exam Constitutional:      General: She is not in acute distress. HENT:     Head: Normocephalic and atraumatic.  Eyes:     General: No scleral icterus. Cardiovascular:     Rate and Rhythm: Normal rate and regular rhythm.     Heart sounds: Normal heart sounds.  Pulmonary:     Effort: Pulmonary effort is normal. No respiratory distress.     Breath sounds: No wheezing.  Abdominal:     General: Bowel sounds are normal. There is no distension.     Palpations: Abdomen is soft.  Musculoskeletal:        General: No deformity. Normal range of motion.     Cervical back: Normal range of motion and neck supple.  Skin:    General: Skin is warm and dry.     Findings: No erythema or rash.  Neurological:     Mental Status: She is alert and oriented to person, place, and time. Mental status is at baseline.     Cranial Nerves: No cranial nerve deficit.     Coordination: Coordination normal.  Psychiatric:        Mood and Affect: Mood normal.       CMP Latest Ref Rng & Units 12/29/2020  Glucose 65 - 99 mg/dL 76  BUN 7 - 25 mg/dL 17  Creatinine 0.60 - 0.93 mg/dL 0.73  Sodium 135 - 146 mmol/L 142  Potassium 3.5 - 5.3 mmol/L 3.7  Chloride 98 - 110 mmol/L 104  CO2 20 - 32 mmol/L 26  Calcium 8.6 - 10.4 mg/dL 9.3  Total Protein 6.1 - 8.1 g/dL 6.7  Total Bilirubin 0.2 - 1.2 mg/dL 1.9(H)  Alkaline Phos 38 - 126 U/L -  AST 10 - 35 U/L 23  ALT 6 - 29 U/L 14   CBC Latest Ref Rng & Units 12/29/2020  WBC 3.8 - 10.8 Thousand/uL 4.1  Hemoglobin 11.7 - 15.5 g/dL 11.8  Hematocrit 35.0 - 45.0 % 34.9(L)  Platelets 140 - 400 Thousand/uL 222     LABORATORY DATA:  I have reviewed the data as listed Lab Results  Component Value Date   WBC 4.1 12/29/2020   HGB 11.8 12/29/2020   HCT 34.9 (L)  12/29/2020   MCV 91.8 12/29/2020   PLT 222 12/29/2020   Recent Labs    03/28/20 1241 06/14/20 1357 09/27/20 1032 09/29/20 1055 12/29/20 0000  NA 137 140 139  --  142  K 3.7 3.9 3.3*  --  3.7  CL 103 101 102  --  104  CO2 26 29 29   --  26  GLUCOSE 100* 114* 95  --  76  BUN 18 21 16   --  17  CREATININE 0.58 0.67 0.80  --  0.73  CALCIUM 9.3 9.1 9.4  --  9.3  GFRNONAA >60 88 >60  --  82  GFRAA >60 102  --   --  95  PROT 7.2 6.4 7.2 7.1 6.7  ALBUMIN 3.9  --  3.8 3.7  --   AST 23 21 29 30 23   ALT 19 33* 19 22 14   ALKPHOS 86  --  69 67  --   BILITOT 1.2 1.2 1.9* 1.5* 1.9*  BILIDIR  --   --   --  0.3*  --   IBILI  --   --   --  1.2*  --    Iron/TIBC/Ferritin/ %Sat    Component Value Date/Time   IRON 70 12/29/2020 0000   IRON 36 12/15/2017 1339   TIBC 346 12/29/2020 0000   TIBC 408 12/15/2017 1339   FERRITIN 60 12/29/2020 0000   FERRITIN 39 12/15/2017 1339   IRONPCTSAT 20 12/29/2020 0000     RADIOGRAPHIC STUDIES: I have personally reviewed the radiological images as listed and agreed with the findings in the report. No results found.     ASSESSMENT & PLAN:  1. Iron deficiency anemia, unspecified iron deficiency anemia type   2. Diverticulosis of colon    #Iron deficiency anemia, Labs are reviewed and discussed with patient Hemoglobin stable at 11.8, iron panel shows stable iron stores. Patient reports that the over-the-counter iron supplementation causes her constipation and she prefers the prescribed iron supplementation but she cannot recall the name of the prescription. I discussed with her that for now iron levels are stable.  She may stop oral iron supplementation and take a multivitamin daily. Repeating her blood work in 6 months to see if iron stores are stable.  Diverticulosis, follow-up with gastroenterology. 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  Recommend her to have CBC  iron TIBC ferritin checked prior to next visit  All questions were answered. The patient knows to call the clinic with any problems questions or concerns.  Cc Delsa Grana, PA-C  Return of visit: 6 months  Earlie Server, MD, PhD Hematology Oncology [facility] 01/05/2021

## 2021-01-08 ENCOUNTER — Telehealth: Payer: Self-pay

## 2021-01-08 NOTE — Telephone Encounter (Signed)
Patient requests to have 6 month follow up labs drawn at PCP ([facility]). Faxed lab orders for cbc, iron, ferr to be added to her lab visit in August at [place].   Fax. [phone].

## 2021-01-08 NOTE — Telephone Encounter (Unsigned)
Copied from [place] [id number]. Topic: General - Inquiry >> Jan 08, 2021 10:56 AM Greggory Keen D wrote: Reason for CRM: Dr Collie Siad nurse Benjamine Mola called saying Dr. Tasia Catchings would like the office to draw some labs for the patient in 6 months when she returns to the office.  Labs are CBC, IRon & TIBC and Ferritin.

## 2021-01-17 DIAGNOSIS — M0609 Rheumatoid arthritis without rheumatoid factor, multiple sites: Secondary | ICD-10-CM | POA: Diagnosis not present

## 2021-01-17 DIAGNOSIS — Z79899 Other long term (current) drug therapy: Secondary | ICD-10-CM | POA: Diagnosis not present

## 2021-01-17 DIAGNOSIS — M1711 Unilateral primary osteoarthritis, right knee: Secondary | ICD-10-CM | POA: Diagnosis not present

## 2021-02-02 DIAGNOSIS — M0609 Rheumatoid arthritis without rheumatoid factor, multiple sites: Secondary | ICD-10-CM | POA: Diagnosis not present

## 2021-02-08 ENCOUNTER — Ambulatory Visit (INDEPENDENT_AMBULATORY_CARE_PROVIDER_SITE_OTHER): Payer: Medicare Other

## 2021-02-08 DIAGNOSIS — Z0000 Encounter for general adult medical examination without abnormal findings: Secondary | ICD-10-CM

## 2021-02-08 NOTE — Progress Notes (Signed)
Subjective:   Holly Green is a 75 y.o. female who presents for Medicare Annual (Subsequent) preventive examination.  Virtual Visit via Video Note  I connected with  Holly Green on 02/08/21 at 10:00 AM EDT via telehealth video enabled device and verified that I am speaking with the correct person using two identifiers.  Location: Patient: home Provider: [facility] Persons participating in the virtual visit: [place]   I discussed the limitations, risks, security and privacy concerns of performing an evaluation and management service by telephone and the availability of in person appointments. The patient expressed understanding and agreed to proceed.  Some vital signs may be absent or patient reported.   Clemetine Marker, LPN    Review of Systems     Cardiac Risk Factors include: advanced age (>61men, >72 women);dyslipidemia;hypertension     Objective:    There were no vitals filed for this visit. There is no height or weight on file to calculate BMI.  Advanced Directives 02/08/2021 01/05/2021 09/29/2020 04/14/2020 03/27/2020 02/04/2020 12/29/2019  Does Patient Have a Medical Advance Directive? Yes No Yes Yes Yes Yes Yes  Type of Paramedic of [place];Living will - Living will;Healthcare Power of [place];Living will Healthcare Power of [place];Living will [place]  Does patient want to make changes to medical advance directive? - - - - - - No - Guardian declined  Copy of [place] in Chart? No - copy requested - No - copy requested No - copy requested No - copy requested No - copy requested Yes - validated most recent copy scanned in chart (See row information)  Would patient like information on creating a medical advance directive? - No - Patient declined - - - - No - Patient declined    Current Medications (verified) Outpatient Encounter  Medications as of 02/08/2021  Medication Sig  . acetaminophen (TYLENOL) 650 MG CR tablet Take 650-1,300 mg by mouth every 8 (eight) hours as needed for pain.  . bisoprolol-hydrochlorothiazide (ZIAC) 10-6.25 MG tablet Take 1 tablet by mouth daily.  . Calcium Carb-Cholecalciferol (CALCIUM-VITAMIN D) 600-400 MG-UNIT TABS Take 1 tablet by mouth daily.  . folic acid (FOLVITE) 1 MG tablet Take 1 mg by mouth daily.  Marland Kitchen loratadine (CLARITIN) 10 MG tablet Take 1 tablet (10 mg total) by mouth daily.  Marland Kitchen losartan (COZAAR) 50 MG tablet Take 1 tablet (50 mg total) by mouth daily.  Marland Kitchen lovastatin (MEVACOR) 40 MG tablet Take 1 tablet (40 mg total) by mouth at bedtime.  . methotrexate 2.5 MG tablet Take 6 tablets (15 mg total) by mouth every 7 (seven) days All on the same day  . Multiple Vitamin (MULTI-VITAMIN) tablet Take 1 tablet by mouth daily.  . polyethylene glycol (MIRALAX) 17 g packet Take 17 g by mouth daily.   No facility-administered encounter medications on file as of 02/08/2021.    Allergies (verified) Aspirin and Lisinopril   History: Past Medical History:  Diagnosis Date  . Abdominal aortic atherosclerosis ([place]) 03/05/2017   CT scan April 2018  . Arthritis   . Bleeding disorder ([place])   . Diverticulitis 2015  . GERD (gastroesophageal reflux disease)   . History of kidney stones   . History of stomach ulcers   . Hyperlipidemia   . Hypertension   . Osteoporosis   . Pre-diabetes   . Prediabetes 10/15/2015  . Spondylolisthesis at L4-L5 level 03/07/2017   Past Surgical History:  Procedure Laterality Date  . ABDOMINAL HYSTERECTOMY    . BACK SURGERY  2010  . BREAST CYST ASPIRATION Right 1990's  . COLONOSCOPY Left 11/09/2017   Procedure: COLONOSCOPY;  Surgeon: Virgel Manifold, MD;  Location: [facility] ENDOSCOPY;  Service: Endoscopy;  Laterality: Left;  . COLONOSCOPY WITH PROPOFOL N/A 04/14/2020   Procedure: COLONOSCOPY WITH PROPOFOL;  Surgeon: Virgel Manifold, MD;  Location: [facility]  ENDOSCOPY;  Service: Endoscopy;  Laterality: N/A;  . CYSTO WITH HYDRODISTENSION N/A 08/15/2017   Procedure: CYSTOSCOPY/HYDRODISTENSION;  Surgeon: Nickie Retort, MD;  Location: [facility] ORS;  Service: Urology;  Laterality: N/A;  . ESOPHAGOGASTRODUODENOSCOPY Left 11/09/2017   Procedure: ESOPHAGOGASTRODUODENOSCOPY (EGD);  Surgeon: Virgel Manifold, MD;  Location: [facility] ENDOSCOPY;  Service: Endoscopy;  Laterality: Left;  . ESOPHAGOGASTRODUODENOSCOPY (EGD) WITH PROPOFOL N/A 02/22/2020   Procedure: ESOPHAGOGASTRODUODENOSCOPY (EGD) WITH PROPOFOL;  Surgeon: Lucilla Lame, MD;  Location: [facility] ENDOSCOPY;  Service: Endoscopy;  Laterality: N/A;  . FOOT SURGERY Right    Family History  Problem Relation Age of Onset  . Hypertension Brother   . Heart disease Brother   . Heart failure Brother   . Hypertension Mother   . Stroke Mother   . Stroke Father   . Prostate cancer Father   . Aneurysm Sister   . Breast cancer Sister 10  . Liver disease Maternal Aunt   . Heart failure Paternal Aunt   . Heart disease Paternal Uncle   . Heart attack Brother   . Heart disease Brother   . Breast cancer Maternal Aunt   . Heart disease Paternal Aunt   . Kidney cancer Neg Hx   . Bladder Cancer Neg Hx    Social History   Socioeconomic History  . Marital status: Single    Spouse name: Not on file  . Number of children: 1  . Years of education: Not on file  . Highest education level: High school graduate  Occupational History  . Occupation: retired  Tobacco Use  . Smoking status: Never Smoker  . Smokeless tobacco: Never Used  Vaping Use  . Vaping Use: Never used  Substance and Sexual Activity  . Alcohol use: No    Alcohol/week: 0.0 standard drinks  . Drug use: No  . Sexual activity: Never  Other Topics Concern  . Not on file  Social History Narrative   Pt's nephew lives with her   Social Determinants of Health   Financial Resource Strain: Low Risk   . Difficulty of Paying Living Expenses: Not  hard at all  Food Insecurity: No Food Insecurity  . Worried About Charity fundraiser in the Last Year: Never true  . Ran Out of Food in the Last Year: Never true  Transportation Needs: No Transportation Needs  . Lack of Transportation (Medical): No  . Lack of Transportation (Non-Medical): No  Physical Activity: Insufficiently Active  . Days of Exercise per Week: 2 days  . Minutes of Exercise per Session: 20 min  Stress: No Stress Concern Present  . Feeling of Stress : Not at all  Social Connections: Moderately Isolated  . Frequency of Communication with Friends and Family: More than three times a week  . Frequency of Social Gatherings with Friends and Family: Once a week  . Attends Religious Services: More than 4 times per year  . Active Member of Clubs or Organizations: No  . Attends Archivist Meetings: Never  . Marital Status: Never married    Tobacco Counseling Counseling given: Not Answered  Clinical Intake:  Pre-visit preparation completed: Yes  Pain : No/denies pain     Nutritional Risks: None Diabetes: No  How often do you need to have someone help you when you read instructions, pamphlets, or other written materials from your doctor or pharmacy?: 1 - Never   Interpreter Needed?: No  Information entered by :: Clemetine Marker LPN   Activities of Daily Living In your present state of health, do you have any difficulty performing the following activities: 02/08/2021 12/29/2020  Hearing? N N  Comment declines hearing aids -  Vision? N N  Difficulty concentrating or making decisions? N N  Walking or climbing stairs? Y N  Dressing or bathing? N N  Doing errands, shopping? N N  Preparing Food and eating ? N -  Using the Toilet? N -  In the past six months, have you accidently leaked urine? Y -  Do you have problems with loss of bowel control? N -  Managing your Medications? N -  Managing your Finances? N -  Housekeeping or managing your Housekeeping? N  -  Some recent data might be hidden    Patient Care Team: Delsa Grana, PA-C as PCP - General (Family Medicine) Virgel Manifold, MD as Consulting Physician (Gastroenterology) Earlie Server, MD as Consulting Physician (Hematology and Oncology) Quintin Alto, MD as Consulting Physician (Rheumatology)  Indicate any recent Medical Services you may have received from other than [facility] providers in the past year (date may be approximate).     Assessment:   This is a routine wellness examination for Holly Green.  Hearing/Vision screen  Hearing Screening   125Hz  250Hz  500Hz  1000Hz  2000Hz  3000Hz  4000Hz  6000Hz  8000Hz   Right ear:           Left ear:           Comments: Pt denies hearing difficulty  Vision Screening Comments: Annual vision screenings done at [facility].   Dietary issues and exercise activities discussed: Current Exercise Habits: Home exercise routine, Type of exercise: Other - see comments (exercise bike), Time (Minutes): 20, Frequency (Times/Week): 2, Weekly Exercise (Minutes/Week): 40, Intensity: Mild, Exercise limited by: orthopedic condition(s)  Goals    . Increase physical activity     Recommend increasing physical activity to at least 3 days per week as tolerated.       Depression Screen PHQ 2/9 Scores 02/08/2021 12/29/2020 09/29/2020 06/30/2020 06/14/2020 02/15/2020 02/04/2020  PHQ - 2 Score 0 0 0 0 0 0 0  PHQ- 9 Score - - - - 0 0 -    Fall Risk Fall Risk  02/08/2021 12/29/2020 10/13/2020 09/29/2020 09/29/2020  Falls in the past year? 0 0 0 0 0  Number falls in past yr: 0 0 - 0 0  Comment - - - - -  Injury with Fall? 0 0 - 0 0  Risk for fall due to : Orthopedic patient - Other (Comment) - -  Risk for fall due to: Comment - - Joint stiffness d/t Arthritis - -  Follow up Falls prevention discussed Falls evaluation completed Falls prevention discussed - -    FALL RISK PREVENTION PERTAINING TO THE HOME:  Any stairs in or around the home? Yes  If so, are  there any without handrails? No  Home free of loose throw rugs in walkways, pet beds, electrical cords, etc? Yes  Adequate lighting in your home to reduce risk of falls? Yes   ASSISTIVE DEVICES UTILIZED TO PREVENT FALLS:  Life alert? No  Use of a cane, walker or w/c? No  Grab bars in the bathroom? No  Shower chair or bench in shower? No  Elevated toilet seat or a handicapped toilet? Yes   TIMED UP AND GO:  Was the test performed? No . Virtual visit.   Cognitive Function: Normal cognitive status assessed by direct observation by this Nurse Health Advisor. No abnormalities found.       6CIT Screen 02/04/2020  What Year? 0 points  What month? 0 points  What time? 0 points  Count back from 20 0 points  Months in reverse 0 points  Repeat phrase 0 points  Total Score 0    Immunizations Immunization History  Administered Date(s) Administered  . Fluad Quad(high Dose 65+) 10/15/2019, 08/17/2020  . Influenza, High Dose Seasonal PF 08/06/2018  . PFIZER(Purple Top)SARS-COV-2 Vaccination 01/19/2020, 02/09/2020, 09/11/2020  . Pneumococcal Conjugate-13 08/06/2018  . Tdap 06/15/2012    TDAP status: Up to date  Flu Vaccine status: Up to date  Pneumococcal vaccine status: Due, Education has been provided regarding the importance of this vaccine. Advised may receive this vaccine at local pharmacy or Health Dept. Aware to provide a copy of the vaccination record if obtained from local pharmacy or Health Dept. Verbalized acceptance and understanding.  Covid-19 vaccine status: Completed vaccines  Qualifies for Shingles Vaccine? No  pt taking methotrexate  Screening Tests Health Maintenance  Topic Date Due  . MAMMOGRAM  08/21/2021  . TETANUS/TDAP  06/15/2022  . DEXA SCAN  08/21/2022  . COLONOSCOPY (Pts 45-60yrs Insurance coverage will need to be confirmed)  04/14/2025  . INFLUENZA VACCINE  Completed  . COVID-19 Vaccine  Completed  . Hepatitis C Screening  Completed  . PNA vac Low  Risk Adult  Completed  . HPV VACCINES  Aged Out    Health Maintenance  There are no preventive care reminders to display for this patient.  Colorectal cancer screening: Type of screening: Colonoscopy. Completed 04/14/20. Repeat every 5 years  Mammogram status: Completed 08/21/20. Repeat every year  Bone Density status: Completed 08/21/20. Results reflect: Bone density results: OSTEOPENIA. Repeat every 2 years.  Lung Cancer Screening: (Low Dose CT Chest recommended if Age 40-80 years, 30 pack-year currently smoking OR have quit w/in 15years.) does not qualify.   Additional Screening:  Hepatitis C Screening: does qualify; Completed 02/20/17  Vision Screening: Recommended annual ophthalmology exams for early detection of glaucoma and other disorders of the eye. Is the patient up to date with their annual eye exam?  No  Who is the provider or what is the name of the office in which the patient attends annual eye exams? [facility]  Dental Screening: Recommended annual dental exams for proper oral hygiene  Community Resource Referral / Chronic Care Management: CRR required this visit?  No   CCM required this visit?  No      Plan:     I have personally reviewed and noted the following in the patient's chart:   . Medical and social history . Use of alcohol, tobacco or illicit drugs  . Current medications and supplements . Functional ability and status . Nutritional status . Physical activity . Advanced directives . List of other physicians . Hospitalizations, surgeries, and ER visits in previous 12 months . Vitals . Screenings to include cognitive, depression, and falls . Referrals and appointments  In addition, I have reviewed and discussed with patient certain preventive protocols, quality metrics, and best practice recommendations. A written personalized care plan for preventive services as  well as general preventive health recommendations were provided to patient.      Clemetine Marker, LPN   02/13/253   Nurse Notes: pt states she noticed a small amount of dark red blood in her stool today with normal bowel movement. Pt advised to notify Dr. Rozanna Box with gastro.   Pt also c/o burning in toes mostly at night when she lies down. Pt denies hx of neuropathy. Advised to discuss with PCP.   Pt states she has a hard time falling asleep; has tried OTC melatonin but doesn't feel it helps much. Pt denies daytime sleepiness or excess caffeine in pm; plans to discuss at next visit.

## 2021-02-08 NOTE — Patient Instructions (Signed)
Holly Green , Thank you for taking time to come for your Medicare Wellness Visit. I appreciate your ongoing commitment to your health goals. Please review the following plan we discussed and let me know if I can assist you in the future.   Screening recommendations/referrals: Colonoscopy: done 04/14/20. Repeat in 2026 Mammogram: done 08/21/20 Bone Density: done 08/21/20 Recommended yearly ophthalmology/optometry visit for glaucoma screening and checkup Recommended yearly dental visit for hygiene and checkup  Vaccinations: Influenza vaccine: done 08/17/20 Pneumococcal vaccine: done 08/06/18. Due for pneumovax23 Tdap vaccine: done 06/15/12 Shingles vaccine: please discuss with your provider for eligibility   Covid-19: done 01/19/20, 02/09/20 & 09/11/20  Advanced directives: Please bring a copy of your health care power of attorney and living will to the office at your convenience.  Conditions/risks identified: Recommend increasing physical activity as tolerated  Next appointment: Follow up in one year for your annual wellness visit    Preventive Care 65 Years and Older, Female Preventive care refers to lifestyle choices and visits with your health care provider that can promote health and wellness. What does preventive care include?  A yearly physical exam. This is also called an annual well check.  Dental exams once or twice a year.  Routine eye exams. Ask your health care provider how often you should have your eyes checked.  Personal lifestyle choices, including:  Daily care of your teeth and gums.  Regular physical activity.  Eating a healthy diet.  Avoiding tobacco and drug use.  Limiting alcohol use.  Practicing safe sex.  Taking low-dose aspirin every day.  Taking vitamin and mineral supplements as recommended by your health care provider. What happens during an annual well check? The services and screenings done by your health care provider during your annual well check  will depend on your age, overall health, lifestyle risk factors, and family history of disease. Counseling  Your health care provider may ask you questions about your:  Alcohol use.  Tobacco use.  Drug use.  Emotional well-being.  Home and relationship well-being.  Sexual activity.  Eating habits.  History of falls.  Memory and ability to understand (cognition).  Work and work Statistician.  Reproductive health. Screening  You may have the following tests or measurements:  Height, weight, and BMI.  Blood pressure.  Lipid and cholesterol levels. These may be checked every 5 years, or more frequently if you are over 87 years old.  Skin check.  Lung cancer screening. You may have this screening every year starting at age 74 if you have a 30-pack-year history of smoking and currently smoke or have quit within the past 15 years.  Fecal occult blood test (FOBT) of the stool. You may have this test every year starting at age 74.  Flexible sigmoidoscopy or colonoscopy. You may have a sigmoidoscopy every 5 years or a colonoscopy every 10 years starting at age 74.  Hepatitis C blood test.  Hepatitis B blood test.  Sexually transmitted disease (STD) testing.  Diabetes screening. This is done by checking your blood sugar (glucose) after you have not eaten for a while (fasting). You may have this done every 1-3 years.  Bone density scan. This is done to screen for osteoporosis. You may have this done starting at age 74.  Mammogram. This may be done every 74-2 years. Talk to your health care provider about how often you should have regular mammograms. Talk with your health care provider about your test results, treatment options, and if necessary, the need  for more tests. Vaccines  Your health care provider may recommend certain vaccines, such as:  Influenza vaccine. This is recommended every year.  Tetanus, diphtheria, and acellular pertussis (Tdap, Td) vaccine. You may  need a Td booster every 10 years.  Zoster vaccine. You may need this after age 74.  Pneumococcal 13-valent conjugate (PCV13) vaccine. One dose is recommended after age 74.  Pneumococcal polysaccharide (PPSV23) vaccine. One dose is recommended after age 35. Talk to your health care provider about which screenings and vaccines you need and how often you need them. This information is not intended to replace advice given to you by your health care provider. Make sure you discuss any questions you have with your health care provider. Document Released: 12/08/2015 Document Revised: 07/31/2016 Document Reviewed: 09/12/2015 Elsevier Interactive Patient Education  2017 [place] Prevention in the Home Falls can cause injuries. They can happen to people of all ages. There are many things you can do to make your home safe and to help prevent falls. What can I do on the outside of my home?  Regularly fix the edges of walkways and driveways and fix any cracks.  Remove anything that might make you trip as you walk through a door, such as a raised step or threshold.  Trim any bushes or trees on the path to your home.  Use bright outdoor lighting.  Clear any walking paths of anything that might make someone trip, such as rocks or tools.  Regularly check to see if handrails are loose or broken. Make sure that both sides of any steps have handrails.  Any raised decks and porches should have guardrails on the edges.  Have any leaves, snow, or ice cleared regularly.  Use sand or salt on walking paths during winter.  Clean up any spills in your garage right away. This includes oil or grease spills. What can I do in the bathroom?  Use night lights.  Install grab bars by the toilet and in the tub and shower. Do not use towel bars as grab bars.  Use non-skid mats or decals in the tub or shower.  If you need to sit down in the shower, use a plastic, non-slip stool.  Keep the floor  dry. Clean up any water that spills on the floor as soon as it happens.  Remove soap buildup in the tub or shower regularly.  Attach bath mats securely with double-sided non-slip rug tape.  Do not have throw rugs and other things on the floor that can make you trip. What can I do in the bedroom?  Use night lights.  Make sure that you have a light by your bed that is easy to reach.  Do not use any sheets or blankets that are too big for your bed. They should not hang down onto the floor.  Have a firm chair that has side arms. You can use this for support while you get dressed.  Do not have throw rugs and other things on the floor that can make you trip. What can I do in the kitchen?  Clean up any spills right away.  Avoid walking on wet floors.  Keep items that you use a lot in easy-to-reach places.  If you need to reach something above you, use a strong step stool that has a grab bar.  Keep electrical cords out of the way.  Do not use floor polish or wax that makes floors slippery. If you must use wax, use  non-skid floor wax.  Do not have throw rugs and other things on the floor that can make you trip. What can I do with my stairs?  Do not leave any items on the stairs.  Make sure that there are handrails on both sides of the stairs and use them. Fix handrails that are broken or loose. Make sure that handrails are as long as the stairways.  Check any carpeting to make sure that it is firmly attached to the stairs. Fix any carpet that is loose or worn.  Avoid having throw rugs at the top or bottom of the stairs. If you do have throw rugs, attach them to the floor with carpet tape.  Make sure that you have a light switch at the top of the stairs and the bottom of the stairs. If you do not have them, ask someone to add them for you. What else can I do to help prevent falls?  Wear shoes that:  Do not have high heels.  Have rubber bottoms.  Are comfortable and fit you  well.  Are closed at the toe. Do not wear sandals.  If you use a stepladder:  Make sure that it is fully opened. Do not climb a closed stepladder.  Make sure that both sides of the stepladder are locked into place.  Ask someone to hold it for you, if possible.  Clearly mark and make sure that you can see:  Any grab bars or handrails.  First and last steps.  Where the edge of each step is.  Use tools that help you move around (mobility aids) if they are needed. These include:  Canes.  Walkers.  Scooters.  Crutches.  Turn on the lights when you go into a dark area. Replace any light bulbs as soon as they burn out.  Set up your furniture so you have a clear path. Avoid moving your furniture around.  If any of your floors are uneven, fix them.  If there are any pets around you, be aware of where they are.  Review your medicines with your doctor. Some medicines can make you feel dizzy. This can increase your chance of falling. Ask your doctor what other things that you can do to help prevent falls. This information is not intended to replace advice given to you by your health care provider. Make sure you discuss any questions you have with your health care provider. Document Released: 09/07/2009 Document Revised: 04/18/2016 Document Reviewed: 12/16/2014 Elsevier Interactive Patient Education  2017 [employer].

## 2021-02-16 DIAGNOSIS — M0609 Rheumatoid arthritis without rheumatoid factor, multiple sites: Secondary | ICD-10-CM | POA: Diagnosis not present

## 2021-03-05 DIAGNOSIS — M0609 Rheumatoid arthritis without rheumatoid factor, multiple sites: Secondary | ICD-10-CM | POA: Diagnosis not present

## 2021-05-01 DIAGNOSIS — M79674 Pain in right toe(s): Secondary | ICD-10-CM | POA: Diagnosis not present

## 2021-05-01 DIAGNOSIS — Z79899 Other long term (current) drug therapy: Secondary | ICD-10-CM | POA: Diagnosis not present

## 2021-05-01 DIAGNOSIS — M0609 Rheumatoid arthritis without rheumatoid factor, multiple sites: Secondary | ICD-10-CM | POA: Diagnosis not present

## 2021-05-01 DIAGNOSIS — M79675 Pain in left toe(s): Secondary | ICD-10-CM | POA: Diagnosis not present

## 2021-05-01 DIAGNOSIS — M170 Bilateral primary osteoarthritis of knee: Secondary | ICD-10-CM | POA: Diagnosis not present

## 2021-05-14 DIAGNOSIS — L84 Corns and callosities: Secondary | ICD-10-CM | POA: Diagnosis not present

## 2021-05-14 DIAGNOSIS — M2012 Hallux valgus (acquired), left foot: Secondary | ICD-10-CM | POA: Diagnosis not present

## 2021-05-14 DIAGNOSIS — L851 Acquired keratosis [keratoderma] palmaris et plantaris: Secondary | ICD-10-CM | POA: Diagnosis not present

## 2021-05-14 DIAGNOSIS — M79674 Pain in right toe(s): Secondary | ICD-10-CM | POA: Diagnosis not present

## 2021-05-14 DIAGNOSIS — M792 Neuralgia and neuritis, unspecified: Secondary | ICD-10-CM | POA: Diagnosis not present

## 2021-05-14 DIAGNOSIS — B351 Tinea unguium: Secondary | ICD-10-CM | POA: Diagnosis not present

## 2021-05-14 DIAGNOSIS — M79675 Pain in left toe(s): Secondary | ICD-10-CM | POA: Diagnosis not present

## 2021-05-14 DIAGNOSIS — M2142 Flat foot [pes planus] (acquired), left foot: Secondary | ICD-10-CM | POA: Diagnosis not present

## 2021-05-14 DIAGNOSIS — M2141 Flat foot [pes planus] (acquired), right foot: Secondary | ICD-10-CM | POA: Diagnosis not present

## 2021-05-14 DIAGNOSIS — Z8739 Personal history of other diseases of the musculoskeletal system and connective tissue: Secondary | ICD-10-CM | POA: Diagnosis not present

## 2021-05-14 DIAGNOSIS — M2011 Hallux valgus (acquired), right foot: Secondary | ICD-10-CM | POA: Diagnosis not present

## 2021-05-14 DIAGNOSIS — M2042 Other hammer toe(s) (acquired), left foot: Secondary | ICD-10-CM | POA: Diagnosis not present

## 2021-05-14 DIAGNOSIS — M2041 Other hammer toe(s) (acquired), right foot: Secondary | ICD-10-CM | POA: Diagnosis not present

## 2021-05-19 ENCOUNTER — Emergency Department: Payer: Medicare Other

## 2021-05-19 ENCOUNTER — Emergency Department
Admission: EM | Admit: 2021-05-19 | Discharge: 2021-05-19 | Disposition: A | Discharge status: Home / Self Care | Payer: Medicare Other | Attending: Emergency Medicine | Admitting: Emergency Medicine

## 2021-05-19 ENCOUNTER — Encounter: Payer: Self-pay | Admitting: Emergency Medicine

## 2021-05-19 ENCOUNTER — Other Ambulatory Visit: Payer: Self-pay

## 2021-05-19 DIAGNOSIS — Z79899 Other long term (current) drug therapy: Secondary | ICD-10-CM | POA: Diagnosis not present

## 2021-05-19 DIAGNOSIS — K921 Melena: Secondary | ICD-10-CM

## 2021-05-19 DIAGNOSIS — I10 Essential (primary) hypertension: Secondary | ICD-10-CM | POA: Diagnosis not present

## 2021-05-19 DIAGNOSIS — K5792 Diverticulitis of intestine, part unspecified, without perforation or abscess without bleeding: Secondary | ICD-10-CM | POA: Insufficient documentation

## 2021-05-19 DIAGNOSIS — R109 Unspecified abdominal pain: Secondary | ICD-10-CM | POA: Diagnosis not present

## 2021-05-19 LAB — CBC WITH DIFFERENTIAL/PLATELET
Abs Immature Granulocytes: 0.02 10*3/uL (ref 0.00–0.07)
Basophils Absolute: 0 10*3/uL (ref 0.0–0.1)
Basophils Relative: 0 %
Eosinophils Absolute: 0.1 10*3/uL (ref 0.0–0.5)
Eosinophils Relative: 1 %
HCT: 36.9 % (ref 36.0–46.0)
Hemoglobin: 12.4 g/dL (ref 12.0–15.0)
Immature Granulocytes: 0 %
Lymphocytes Relative: 31 %
Lymphs Abs: 1.8 10*3/uL (ref 0.7–4.0)
MCH: 31.4 pg (ref 26.0–34.0)
MCHC: 33.6 g/dL (ref 30.0–36.0)
MCV: 93.4 fL (ref 80.0–100.0)
Monocytes Absolute: 0.5 10*3/uL (ref 0.1–1.0)
Monocytes Relative: 9 %
Neutro Abs: 3.3 10*3/uL (ref 1.7–7.7)
Neutrophils Relative %: 59 %
Platelets: 197 10*3/uL (ref 150–400)
RBC: 3.95 MIL/uL (ref 3.87–5.11)
RDW: 14.1 % (ref 11.5–15.5)
WBC: 5.7 10*3/uL (ref 4.0–10.5)
nRBC: 0 % (ref 0.0–0.2)

## 2021-05-19 LAB — URINALYSIS, COMPLETE (UACMP) WITH MICROSCOPIC
Bilirubin Urine: NEGATIVE
Glucose, UA: NEGATIVE mg/dL
Hgb urine dipstick: NEGATIVE
Ketones, ur: NEGATIVE mg/dL
Leukocytes,Ua: NEGATIVE
Nitrite: NEGATIVE
Protein, ur: NEGATIVE mg/dL
Specific Gravity, Urine: 1.005 (ref 1.005–1.030)
pH: 7 (ref 5.0–8.0)

## 2021-05-19 LAB — COMPREHENSIVE METABOLIC PANEL
ALT: 21 U/L (ref 0–44)
AST: 41 U/L (ref 15–41)
Albumin: 3.9 g/dL (ref 3.5–5.0)
Alkaline Phosphatase: 69 U/L (ref 38–126)
Anion gap: 7 (ref 5–15)
BUN: 17 mg/dL (ref 8–23)
CO2: 26 mmol/L (ref 22–32)
Calcium: 8.9 mg/dL (ref 8.9–10.3)
Chloride: 103 mmol/L (ref 98–111)
Creatinine, Ser: 0.61 mg/dL (ref 0.44–1.00)
GFR, Estimated: >60 mL/min (ref >60)
Glucose, Bld: 89 mg/dL (ref 70–99)
Potassium: 4.4 mmol/L (ref 3.5–5.1)
Sodium: 136 mmol/L (ref 135–145)
Total Bilirubin: 1.5 mg/dL — ABNORMAL HIGH (ref 0.3–1.2)
Total Protein: 7.4 g/dL (ref 6.5–8.1)

## 2021-05-19 LAB — LIPASE, BLOOD: Lipase: 45 U/L (ref 11–51)

## 2021-05-19 MED ORDER — IOHEXOL 300 MG/ML  SOLN
100.0000 mL | Freq: Once | INTRAMUSCULAR | Status: AC | PRN
  Administered 2021-05-19: 100 mL via INTRAVENOUS

## 2021-05-19 MED ORDER — LACTATED RINGERS IV BOLUS
1000.0000 mL | Freq: Once | INTRAVENOUS | Status: AC
  Administered 2021-05-19: 1000 mL via INTRAVENOUS

## 2021-05-19 MED ORDER — MORPHINE SULFATE (PF) 2 MG/ML IV SOLN
2.0000 mg | Freq: Once | INTRAVENOUS | Status: AC
  Administered 2021-05-19: 2 mg via INTRAVENOUS
  Filled 2021-05-19: qty 1

## 2021-05-19 MED ORDER — AMOXICILLIN-POT CLAVULANATE 875-125 MG PO TABS: 1.0000 | ORAL_TABLET | Freq: Two times a day (BID) | ORAL | 0 refills | Status: AC

## 2021-05-19 MED ORDER — AMOXICILLIN-POT CLAVULANATE 875-125 MG PO TABS
1.0000 | ORAL_TABLET | Freq: Once | ORAL | Status: AC
  Administered 2021-05-19: 1 via ORAL
  Filled 2021-05-19: qty 1

## 2021-05-19 NOTE — Discharge Instructions (Addendum)
Use Tylenol for pain and fevers.  Up to 1000 mg per dose, up to 4 times per day.  Do not take more than 4000 mg of Tylenol/acetaminophen within 24 hours..  

## 2021-05-19 NOTE — ED Provider Notes (Signed)
[facility] Emergency Department Provider Note ____________________________________________   Event Date/Time   First MD Initiated Contact with Patient 05/19/21 [id number]     (approximate)  I have reviewed the triage vital signs and the nursing notes.  HISTORY  Chief Complaint Rectal Bleeding and Abdominal Pain   HPI Holly Green is a 75 y.o. femalewho presents to the ED for evaluation of abdominal cramping and dark stools.  Chart review indicates history of rheumatoid arthritis on certolizumab and methotrexate.  History of iron deficiency anemia and small hiatal hernia.  Diverticulosis.  Last colonoscopy/endoscopy 1 year ago.  Patient reports waking up this morning and feeling fine, but developing abdominal cramping and loose stools in the past couple hours.  She reports multiple bowel movements at home with hematochezia, abdominal cramping.  She reports the cramping and nausea improved after passing a bowel movement.  Denies emesis, fever, vaginal bleeding.   Past Medical History:  Diagnosis Date   Abdominal aortic atherosclerosis ([place]) 03/05/2017   CT scan April 2018   Arthritis    Bleeding disorder ([place])    Diverticulitis 2015   GERD (gastroesophageal reflux disease)    History of kidney stones    History of stomach ulcers    Hyperlipidemia    Hypertension    Osteoporosis    Pre-diabetes    Prediabetes 10/15/2015   Spondylolisthesis at L4-L5 level 03/07/2017    Patient Active Problem List   Diagnosis Date Noted   Acute cystitis with hematuria 09/29/2020   Chronic cough 08/17/2020   Rheumatoid arthritis of multiple sites with negative rheumatoid factor ([place]) 06/09/2020   Special screening for malignant neoplasms, colon    Abnormal CT scan, gastrointestinal tract    Gastrointestinal hemorrhage 12/29/2019   Left hip pain 10/15/2019   Obesity (BMI 30.0-34.9) 08/10/2018   Emphysema of lung ([place]) 08/10/2018   Allergic rhinitis 08/10/2018   COAD  (chronic obstructive airways disease) ([place]) 08/03/2018   Primary osteoarthritis of right knee 07/17/2018   Chondrocalcinosis 07/17/2018   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, of knee, 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   Anemia 12/17/2017   Diverticulosis of large intestine without diverticulitis    History of GI bleed 11/06/2017   Left flank pain, chronic 03/07/2017   Spondylolisthesis at L4-L5 level 03/07/2017   Abdominal aortic atherosclerosis ([place]) 03/05/2017   Simple cyst of kidney 03/05/2017   Benign liver cyst 03/05/2017   Medication monitoring encounter 11/22/2016   GERD (gastroesophageal reflux disease) 08/07/2016   Osteoporosis 05/07/2016   Cardiac murmur 05/07/2016   Prediabetes 10/15/2015   Hyperlipidemia 10/15/2015   Essential hypertension 10/15/2015   Diverticulosis of colon, acquired 10/15/2015    Past Surgical History:  Procedure Laterality Date   ABDOMINAL HYSTERECTOMY     BACK SURGERY  2010   BREAST CYST ASPIRATION Right 1990's   COLONOSCOPY Left 11/09/2017   Procedure: COLONOSCOPY;  Surgeon: Virgel Manifold, MD;  Location: [facility] ENDOSCOPY;  Service: Endoscopy;  Laterality: Left;   COLONOSCOPY WITH PROPOFOL N/A 04/14/2020   Procedure: COLONOSCOPY WITH PROPOFOL;  Surgeon: Virgel Manifold, MD;  Location: [facility] ENDOSCOPY;  Service: Endoscopy;  Laterality: N/A;   CYSTO WITH HYDRODISTENSION N/A 08/15/2017   Procedure: CYSTOSCOPY/HYDRODISTENSION;  Surgeon: Nickie Retort, MD;  Location: [facility] ORS;  Service: Urology;  Laterality: N/A;   ESOPHAGOGASTRODUODENOSCOPY Left 11/09/2017   Procedure: ESOPHAGOGASTRODUODENOSCOPY (EGD);  Surgeon: Virgel Manifold, MD;  Location: [facility] ENDOSCOPY;  Service: Endoscopy;  Laterality: Left;   ESOPHAGOGASTRODUODENOSCOPY (EGD) WITH PROPOFOL N/A 02/22/2020   Procedure: ESOPHAGOGASTRODUODENOSCOPY (  EGD) WITH PROPOFOL;  Surgeon: Lucilla Lame, MD;  Location: [facility] ENDOSCOPY;  Service: Endoscopy;  Laterality: N/A;   FOOT SURGERY Right      Prior to Admission medications   Medication Sig Start Date End Date Taking? Authorizing Provider  amoxicillin-clavulanate (AUGMENTIN) 875-125 MG tablet Take 1 tablet by mouth 2 (two) times daily for 5 days. 05/19/21 05/24/21 Yes Vladimir Crofts, MD  acetaminophen (TYLENOL) 650 MG CR tablet Take 650-1,300 mg by mouth every 8 (eight) hours as needed for pain.    [provider]  bisoprolol-hydrochlorothiazide (ZIAC) 10-6.25 MG tablet Take 1 tablet by mouth daily. 06/14/20   Delsa Grana, PA-C  Calcium Carb-Cholecalciferol (CALCIUM-VITAMIN D) 600-400 MG-UNIT TABS Take 1 tablet by mouth daily.    [provider]  folic acid (FOLVITE) 1 MG tablet Take 1 mg by mouth daily. 08/11/20 08/11/21  [provider]  loratadine (CLARITIN) 10 MG tablet Take 1 tablet (10 mg total) by mouth daily. 10/26/19   Hubbard Hartshorn, FNP  losartan (COZAAR) 50 MG tablet Take 1 tablet (50 mg total) by mouth daily. 06/14/20   Delsa Grana, PA-C  lovastatin (MEVACOR) 40 MG tablet Take 1 tablet (40 mg total) by mouth at bedtime. 06/14/20   Delsa Grana, PA-C  methotrexate 2.5 MG tablet Take 6 tablets (15 mg total) by mouth every 7 (seven) days All on the same day 09/07/20   [provider]  Multiple Vitamin (MULTI-VITAMIN) tablet Take 1 tablet by mouth daily.    [provider]  polyethylene glycol (MIRALAX) 17 g packet Take 17 g by mouth daily. 01/18/20   Virgel Manifold, MD    Allergies Aspirin and Lisinopril  Family History  Problem Relation Age of Onset   Hypertension Brother    Heart disease Brother    Heart failure Brother    Hypertension Mother    Stroke Mother    Stroke Father    Prostate cancer Father    Aneurysm Sister    Breast cancer Sister 84   Liver disease Maternal Aunt    Heart failure Paternal Aunt    Heart disease Paternal Uncle    Heart attack Brother    Heart disease Brother    Breast cancer Maternal Aunt    Heart disease Paternal Aunt    Kidney  cancer Neg Hx    Bladder Cancer Neg Hx     Social History Social History   Tobacco Use   Smoking status: Never   Smokeless tobacco: Never  Vaping Use   Vaping Use: Never used  Substance Use Topics   Alcohol use: No    Alcohol/week: 0.0 standard drinks   Drug use: No    Review of Systems  Constitutional: No fever/chills Eyes: No visual changes. ENT: No sore throat. Cardiovascular: Denies chest pain. Respiratory: Denies shortness of breath. Gastrointestinal:  no vomiting.  No constipation. Positive for abdominal pain, nausea and diarrhea, hematochezia. Genitourinary: Negative for dysuria. Musculoskeletal: Negative for back pain. Skin: Negative for rash. Neurological: Negative for headaches, focal weakness or numbness. ____________________________________________   PHYSICAL EXAM:  VITAL SIGNS: Vitals:   05/19/21 1130 05/19/21 1221  BP: (!) 189/103 (!) 179/92  Pulse: 68 (!) 59  Resp:  18  Temp:    SpO2: 98% 100%     Constitutional: Alert and oriented. Well appearing and in no acute distress. Eyes: Conjunctivae are normal. PERRL. EOMI. Head: Atraumatic. Nose: No congestion/rhinnorhea. Mouth/Throat: Mucous membranes are moist.  Oropharynx non-erythematous. Neck: No stridor. No cervical spine tenderness  to palpation. Cardiovascular: Normal rate, regular rhythm. Grossly normal heart sounds.  Good peripheral circulation. Respiratory: Normal respiratory effort.  No retractions. Lungs CTAB. Gastrointestinal: Soft , nondistended. No CVA tenderness. Diffuse tenderness without peritoneal features. Musculoskeletal: No lower extremity tenderness nor edema.  No joint effusions. No signs of acute trauma. Neurologic:  Normal speech and language. No gross focal neurologic deficits are appreciated. No gait instability noted. Skin:  Skin is warm, dry and intact. No rash noted. Psychiatric: Mood and affect are normal. Speech and behavior are  normal.  ____________________________________________   LABS (all labs ordered are listed, but only abnormal results are displayed)  Labs Reviewed  COMPREHENSIVE METABOLIC PANEL - Abnormal; Notable for the following components:      Result Value   Total Bilirubin 1.5 (*)    All other components within normal limits  URINALYSIS, COMPLETE (UACMP) WITH MICROSCOPIC - Abnormal; Notable for the following components:   Color, Urine STRAW (*)    APPearance CLEAR (*)    Bacteria, UA RARE (*)    All other components within normal limits  CBC WITH DIFFERENTIAL/PLATELET  LIPASE, BLOOD   ____________________________________________  12 Lead EKG   ____________________________________________  RADIOLOGY  ED MD interpretation:    Official radiology report(s): CT ABDOMEN PELVIS W CONTRAST  Result Date: 05/19/2021 CLINICAL DATA:  Abdominal pain.  Hematochezia. EXAM: CT ABDOMEN AND PELVIS WITH CONTRAST TECHNIQUE: Multidetector CT imaging of the abdomen and pelvis was performed using the standard protocol following bolus administration of intravenous contrast. CONTRAST:  166mL OMNIPAQUE IOHEXOL 300 MG/ML  SOLN COMPARISON:  CT, 01/28/2020. FINDINGS: Lower chest: No acute abnormality. Hepatobiliary: Liver normal in size and overall attenuation. Several low-attenuation liver masses, most subcentimeter, the largest in segment 2 measuring 2.2 cm, stable and consistent with cysts. Normal gallbladder. No bile duct dilation. Pancreas: Unremarkable. No pancreatic ductal dilatation or surrounding inflammatory changes. Spleen: Normal in size without focal abnormality. Adrenals/Urinary Tract: No adrenal masses. Kidneys normal size, orientation and position with symmetric enhancement and excretion. 1 cm upper pole left renal cyst. No other masses, no stones and no hydronephrosis. Ureters normal in course and in caliber. Bladder is unremarkable. Stomach/Bowel: Mild inflammatory change seen in the fat adjacent to the  colon at the level of the splenic flexure. There are several diverticula in this location and this portion of the colon shows wall thickening. Findings consistent with mild uncomplicated diverticulitis. There is no extraluminal or free air and there is no fluid collection to suggest an abscess. There are numerous other colonic diverticula with no other evidence of diverticulitis. Small bowel and stomach are unremarkable. Normal appendix visualized. Vascular/Lymphatic: Aortic atherosclerosis. No aneurysm. No enlarged lymph nodes. Reproductive: Status post hysterectomy. No adnexal masses. Other: No abdominal wall hernia or abnormality. No abdominopelvic ascites. 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 No acute fractures. No osteoblastic or osteolytic lesions. IMPRESSION: 1. Mild uncomplicated diverticulitis of the splenic flexure of the colon. No extraluminal or free air and no evidence of an abscess. 2. No other acute abnormality within the abdomen or pelvis. 3. Pan colonic diverticulosis.  Aortic atherosclerosis. Electronically Signed   By: Lajean Manes M.D.   On: 05/19/2021 12:38    ____________________________________________   PROCEDURES and INTERVENTIONS  Procedure(s) performed (including Critical Care):  Procedures  Medications  lactated ringers bolus 1,000 mL (1,000 mLs Intravenous New Bag/Given 05/19/21 1057)  morphine 2 MG/ML injection 2 mg (2 mg Intravenous Given 05/19/21 1058)  iohexol (OMNIPAQUE) 300 MG/ML solution 100 mL (100 mLs Intravenous Contrast Given  05/19/21 1154)  amoxicillin-clavulanate (AUGMENTIN) 875-125 MG per tablet 1 tablet (1 tablet Oral Given 05/19/21 1304)    ____________________________________________   MDM / ED COURSE   75 year old woman with history of diverticulosis presents to the ED with abdominal cramping and hematochezia, with evidence of uncomplicated diverticulitis amenable to outpatient management.  Exam with rather diffuse abdominal tenderness  without peritoneal features.  She clinically looks well.  No evidence of sepsis or acute cystitis.  CT imaging confirms diverticulitis without evidence of associated abscess or complication.  She is tolerating p.o. intake and has controlled symptoms.  We will discharge with return precautions and recommendations to follow-up with GI as an outpatient.   Clinical Course as of 05/19/21 1323  Sat May 19, 2021  1119 Reassessed.   [DS]  1610 Reassessed.  Patient reports feeling okay.  We discussed diverticulitis.  We discussed following up with GI as an outpatient.  Answered questions. [DS]    Clinical Course User Index [DS] Vladimir Crofts, MD    ____________________________________________   FINAL CLINICAL IMPRESSION(S) / ED DIAGNOSES  Final diagnoses:  Diverticulitis  Hematochezia     ED Discharge Orders          Ordered    amoxicillin-clavulanate (AUGMENTIN) 875-125 MG tablet  2 times daily        05/19/21 1322             Tomie Elko   Note:  This document was prepared using Systems analyst and may include unintentional dictation errors.    Vladimir Crofts, MD 05/19/21 1324

## 2021-05-19 NOTE — ED Notes (Signed)
Patient given water and crackers. 

## 2021-05-19 NOTE — ED Triage Notes (Signed)
Pt reports started with rectal bleeding and abd pain this am. Pt states the blood is dark in color and describes her abd pain as crampy in nature

## 2021-05-25 ENCOUNTER — Encounter: Payer: Self-pay | Admitting: Family Medicine

## 2021-05-25 ENCOUNTER — Other Ambulatory Visit: Payer: Self-pay

## 2021-05-25 ENCOUNTER — Inpatient Hospital Stay
Admission: EM | Admit: 2021-05-25 | Discharge: 2021-05-28 | DRG: 378 | Disposition: A | Discharge status: Home / Self Care | Payer: Medicare Other | Attending: Internal Medicine | Admitting: Internal Medicine

## 2021-05-25 DIAGNOSIS — K922 Gastrointestinal hemorrhage, unspecified: Secondary | ICD-10-CM | POA: Diagnosis present

## 2021-05-25 DIAGNOSIS — M1990 Unspecified osteoarthritis, unspecified site: Secondary | ICD-10-CM | POA: Diagnosis present

## 2021-05-25 DIAGNOSIS — E669 Obesity, unspecified: Secondary | ICD-10-CM | POA: Diagnosis present

## 2021-05-25 DIAGNOSIS — D649 Anemia, unspecified: Secondary | ICD-10-CM

## 2021-05-25 DIAGNOSIS — I10 Essential (primary) hypertension: Secondary | ICD-10-CM | POA: Diagnosis not present

## 2021-05-25 DIAGNOSIS — D62 Acute posthemorrhagic anemia: Secondary | ICD-10-CM | POA: Diagnosis present

## 2021-05-25 DIAGNOSIS — K219 Gastro-esophageal reflux disease without esophagitis: Secondary | ICD-10-CM | POA: Diagnosis present

## 2021-05-25 DIAGNOSIS — Z6831 Body mass index (BMI) 31.0-31.9, adult: Secondary | ICD-10-CM

## 2021-05-25 DIAGNOSIS — M0609 Rheumatoid arthritis without rheumatoid factor, multiple sites: Secondary | ICD-10-CM | POA: Diagnosis present

## 2021-05-25 DIAGNOSIS — J449 Chronic obstructive pulmonary disease, unspecified: Secondary | ICD-10-CM | POA: Diagnosis present

## 2021-05-25 DIAGNOSIS — K5792 Diverticulitis of intestine, part unspecified, without perforation or abscess without bleeding: Secondary | ICD-10-CM

## 2021-05-25 DIAGNOSIS — Z79899 Other long term (current) drug therapy: Secondary | ICD-10-CM

## 2021-05-25 DIAGNOSIS — J309 Allergic rhinitis, unspecified: Secondary | ICD-10-CM | POA: Diagnosis present

## 2021-05-25 DIAGNOSIS — K5733 Diverticulitis of large intestine without perforation or abscess with bleeding: Secondary | ICD-10-CM | POA: Diagnosis not present

## 2021-05-25 DIAGNOSIS — Z888 Allergy status to other drugs, medicaments and biological substances status: Secondary | ICD-10-CM

## 2021-05-25 DIAGNOSIS — E785 Hyperlipidemia, unspecified: Secondary | ICD-10-CM

## 2021-05-25 DIAGNOSIS — Z713 Dietary counseling and surveillance: Secondary | ICD-10-CM

## 2021-05-25 DIAGNOSIS — M810 Age-related osteoporosis without current pathological fracture: Secondary | ICD-10-CM | POA: Diagnosis present

## 2021-05-25 DIAGNOSIS — Z886 Allergy status to analgesic agent status: Secondary | ICD-10-CM | POA: Diagnosis not present

## 2021-05-25 DIAGNOSIS — Z90710 Acquired absence of both cervix and uterus: Secondary | ICD-10-CM | POA: Diagnosis not present

## 2021-05-25 DIAGNOSIS — R7303 Prediabetes: Secondary | ICD-10-CM | POA: Diagnosis present

## 2021-05-25 DIAGNOSIS — I160 Hypertensive urgency: Secondary | ICD-10-CM | POA: Diagnosis present

## 2021-05-25 DIAGNOSIS — Z8719 Personal history of other diseases of the digestive system: Secondary | ICD-10-CM

## 2021-05-25 DIAGNOSIS — Z8711 Personal history of peptic ulcer disease: Secondary | ICD-10-CM | POA: Diagnosis not present

## 2021-05-25 DIAGNOSIS — Z20822 Contact with and (suspected) exposure to covid-19: Secondary | ICD-10-CM | POA: Diagnosis present

## 2021-05-25 DIAGNOSIS — Z8249 Family history of ischemic heart disease and other diseases of the circulatory system: Secondary | ICD-10-CM | POA: Diagnosis not present

## 2021-05-25 DIAGNOSIS — I700 Atherosclerosis of aorta: Secondary | ICD-10-CM | POA: Diagnosis present

## 2021-05-25 HISTORY — DX: Gastrointestinal hemorrhage, unspecified: K92.2

## 2021-05-25 LAB — BASIC METABOLIC PANEL
Anion gap: 5 (ref 5–15)
BUN: 27 mg/dL — ABNORMAL HIGH (ref 8–23)
CO2: 28 mmol/L (ref 22–32)
Calcium: 8.7 mg/dL — ABNORMAL LOW (ref 8.9–10.3)
Chloride: 107 mmol/L (ref 98–111)
Creatinine, Ser: 0.86 mg/dL (ref 0.44–1.00)
GFR, Estimated: >60 mL/min (ref >60)
Glucose, Bld: 122 mg/dL — ABNORMAL HIGH (ref 70–99)
Potassium: 4.7 mmol/L (ref 3.5–5.1)
Sodium: 140 mmol/L (ref 135–145)

## 2021-05-25 LAB — COMPREHENSIVE METABOLIC PANEL
ALT: 37 U/L (ref 0–44)
AST: 41 U/L (ref 15–41)
Albumin: 3.6 g/dL (ref 3.5–5.0)
Alkaline Phosphatase: 71 U/L (ref 38–126)
Anion gap: 7 (ref 5–15)
BUN: 29 mg/dL — ABNORMAL HIGH (ref 8–23)
CO2: 25 mmol/L (ref 22–32)
Calcium: 9 mg/dL (ref 8.9–10.3)
Chloride: 107 mmol/L (ref 98–111)
Creatinine, Ser: 0.88 mg/dL (ref 0.44–1.00)
GFR, Estimated: >60 mL/min (ref >60)
Glucose, Bld: 131 mg/dL — ABNORMAL HIGH (ref 70–99)
Potassium: 3.8 mmol/L (ref 3.5–5.1)
Sodium: 139 mmol/L (ref 135–145)
Total Bilirubin: 0.8 mg/dL (ref 0.3–1.2)
Total Protein: 6.8 g/dL (ref 6.5–8.1)

## 2021-05-25 LAB — CBC
HCT: 24 % — ABNORMAL LOW (ref 36.0–46.0)
HCT: 22.7 % — ABNORMAL LOW (ref 36.0–46.0)
Hemoglobin: 8.2 g/dL — ABNORMAL LOW (ref 12.0–15.0)
Hemoglobin: 7.6 g/dL — ABNORMAL LOW (ref 12.0–15.0)
MCH: 31.8 pg (ref 26.0–34.0)
MCH: 31.8 pg (ref 26.0–34.0)
MCHC: 34.2 g/dL (ref 30.0–36.0)
MCHC: 33.5 g/dL (ref 30.0–36.0)
MCV: 93 fL (ref 80.0–100.0)
MCV: 95 fL (ref 80.0–100.0)
Platelets: 216 10*3/uL (ref 150–400)
Platelets: 177 10*3/uL (ref 150–400)
RBC: 2.58 MIL/uL — ABNORMAL LOW (ref 3.87–5.11)
RBC: 2.39 MIL/uL — ABNORMAL LOW (ref 3.87–5.11)
RDW: 14.2 % (ref 11.5–15.5)
RDW: 13.8 % (ref 11.5–15.5)
WBC: 6.4 10*3/uL (ref 4.0–10.5)
WBC: 7.2 10*3/uL (ref 4.0–10.5)
nRBC: 0 % (ref 0.0–0.2)
nRBC: 0 % (ref 0.0–0.2)

## 2021-05-25 LAB — URINALYSIS, COMPLETE (UACMP) WITH MICROSCOPIC
Bacteria, UA: NONE SEEN
Bilirubin Urine: NEGATIVE
Glucose, UA: NEGATIVE mg/dL
Hgb urine dipstick: NEGATIVE
Ketones, ur: NEGATIVE mg/dL
Leukocytes,Ua: NEGATIVE
Nitrite: NEGATIVE
Protein, ur: NEGATIVE mg/dL
Specific Gravity, Urine: 1.017 (ref 1.005–1.030)
pH: 5 (ref 5.0–8.0)

## 2021-05-25 LAB — LIPASE, BLOOD: Lipase: 38 U/L (ref 11–51)

## 2021-05-25 LAB — HEMOGLOBIN AND HEMATOCRIT, BLOOD
HCT: 25.3 % — ABNORMAL LOW (ref 36.0–46.0)
HCT: 28 % — ABNORMAL LOW (ref 36.0–46.0)
HCT: 28.1 % — ABNORMAL LOW (ref 36.0–46.0)
Hemoglobin: 8.4 g/dL — ABNORMAL LOW (ref 12.0–15.0)
Hemoglobin: 9.6 g/dL — ABNORMAL LOW (ref 12.0–15.0)
Hemoglobin: 9.4 g/dL — ABNORMAL LOW (ref 12.0–15.0)

## 2021-05-25 LAB — TROPONIN I (HIGH SENSITIVITY)
Troponin I (High Sensitivity): 11 ng/L (ref <18)
Troponin I (High Sensitivity): 9 ng/L (ref <18)

## 2021-05-25 LAB — RESP PANEL BY RT-PCR (FLU A&B, COVID) ARPGX2
Influenza A by PCR: NEGATIVE
Influenza B by PCR: NEGATIVE
SARS Coronavirus 2 by RT PCR: NEGATIVE

## 2021-05-25 LAB — PROTIME-INR
INR: 1 (ref 0.8–1.2)
Prothrombin Time: 13.3 seconds (ref 11.4–15.2)

## 2021-05-25 LAB — PREPARE RBC (CROSSMATCH)

## 2021-05-25 MED ORDER — ACETAMINOPHEN 650 MG RE SUPP: 650.0000 mg | Freq: Four times a day (QID) | RECTAL | Status: DC | PRN

## 2021-05-25 MED ORDER — ACETAMINOPHEN 325 MG PO TABS: 650.0000 mg | ORAL_TABLET | Freq: Four times a day (QID) | ORAL | Status: DC | PRN

## 2021-05-25 MED ORDER — METHOTREXATE 2.5 MG PO TABS
10.0000 mg | ORAL_TABLET | ORAL | Status: DC
  Administered 2021-05-27: 10 mg via ORAL
  Filled 2021-05-25: qty 4

## 2021-05-25 MED ORDER — SODIUM CHLORIDE 0.9 % IV SOLN: INTRAVENOUS | Status: AC

## 2021-05-25 MED ORDER — ONDANSETRON HCL 4 MG/2ML IJ SOLN: 4.0000 mg | Freq: Four times a day (QID) | INTRAMUSCULAR | Status: DC | PRN

## 2021-05-25 MED ORDER — MAGNESIUM HYDROXIDE 400 MG/5ML PO SUSP: 30.0000 mL | Freq: Every day | ORAL | Status: DC | PRN

## 2021-05-25 MED ORDER — FOLIC ACID 1 MG PO TABS
1.0000 mg | ORAL_TABLET | Freq: Every day | ORAL | Status: DC
  Administered 2021-05-25 – 2021-05-28 (×4): 1 mg via ORAL
  Filled 2021-05-25 (×4): qty 1

## 2021-05-25 MED ORDER — TRAZODONE HCL 50 MG PO TABS: 25.0000 mg | ORAL_TABLET | Freq: Every evening | ORAL | Status: DC | PRN

## 2021-05-25 MED ORDER — CALCIUM CARBONATE-VITAMIN D 500-200 MG-UNIT PO TABS
1.0000 | ORAL_TABLET | Freq: Every day | ORAL | Status: DC
  Administered 2021-05-25 – 2021-05-28 (×4): 1 via ORAL
  Filled 2021-05-25 (×4): qty 1

## 2021-05-25 MED ORDER — PIPERACILLIN-TAZOBACTAM 3.375 G IVPB
3.3750 g | Freq: Three times a day (TID) | INTRAVENOUS | Status: DC
  Administered 2021-05-25: 3.375 g via INTRAVENOUS
  Filled 2021-05-25: qty 50

## 2021-05-25 MED ORDER — LORATADINE 10 MG PO TABS
10.0000 mg | ORAL_TABLET | Freq: Every day | ORAL | Status: DC
  Administered 2021-05-25 – 2021-05-28 (×4): 10 mg via ORAL
  Filled 2021-05-25 (×4): qty 1

## 2021-05-25 MED ORDER — LABETALOL HCL 5 MG/ML IV SOLN
20.0000 mg | INTRAVENOUS | Status: DC | PRN
  Administered 2021-05-25: 20 mg via INTRAVENOUS
  Filled 2021-05-25: qty 4

## 2021-05-25 MED ORDER — LOSARTAN POTASSIUM 50 MG PO TABS
50.0000 mg | ORAL_TABLET | Freq: Every day | ORAL | Status: DC
  Administered 2021-05-25 – 2021-05-28 (×4): 50 mg via ORAL
  Filled 2021-05-25 (×4): qty 1

## 2021-05-25 MED ORDER — METRONIDAZOLE 500 MG/100ML IV SOLN
500.0000 mg | Freq: Three times a day (TID) | INTRAVENOUS | Status: AC
  Administered 2021-05-25 – 2021-05-27 (×8): 500 mg via INTRAVENOUS
  Filled 2021-05-25 (×8): qty 100

## 2021-05-25 MED ORDER — SODIUM CHLORIDE 0.9 % IV SOLN
2.0000 g | INTRAVENOUS | Status: AC
  Administered 2021-05-25 – 2021-05-27 (×3): 2 g via INTRAVENOUS
  Filled 2021-05-25: qty 20
  Filled 2021-05-25 (×2): qty 2

## 2021-05-25 MED ORDER — MORPHINE SULFATE (PF) 2 MG/ML IV SOLN: 2.0000 mg | INTRAVENOUS | Status: DC | PRN

## 2021-05-25 MED ORDER — PRAVASTATIN SODIUM 40 MG PO TABS
40.0000 mg | ORAL_TABLET | Freq: Every day | ORAL | Status: DC
  Administered 2021-05-25 – 2021-05-27 (×3): 40 mg via ORAL
  Filled 2021-05-25 (×4): qty 1

## 2021-05-25 MED ORDER — NITROGLYCERIN 0.4 MG SL SUBL: 0.4000 mg | SUBLINGUAL_TABLET | SUBLINGUAL | Status: DC | PRN

## 2021-05-25 MED ORDER — SODIUM CHLORIDE 0.9 % IV SOLN: INTRAVENOUS | Status: DC

## 2021-05-25 MED ORDER — SODIUM CHLORIDE 0.9 % IV SOLN: 10.0000 mL/h | Freq: Once | INTRAVENOUS | Status: DC

## 2021-05-25 MED ORDER — PIPERACILLIN-TAZOBACTAM 3.375 G IVPB 30 MIN
3.3750 g | Freq: Once | INTRAVENOUS | Status: AC
  Administered 2021-05-25: 3.375 g via INTRAVENOUS
  Filled 2021-05-25: qty 50

## 2021-05-25 MED ORDER — PIPERACILLIN-TAZOBACTAM 3.375 G IVPB 30 MIN: 3.3750 g | Freq: Four times a day (QID) | INTRAVENOUS | Status: DC

## 2021-05-25 MED ORDER — ADULT MULTIVITAMIN W/MINERALS CH
1.0000 | ORAL_TABLET | Freq: Every day | ORAL | Status: DC
  Administered 2021-05-25 – 2021-05-28 (×4): 1 via ORAL
  Filled 2021-05-25 (×4): qty 1

## 2021-05-25 MED ORDER — ONDANSETRON HCL 4 MG PO TABS: 4.0000 mg | ORAL_TABLET | Freq: Four times a day (QID) | ORAL | Status: DC | PRN

## 2021-05-25 MED ORDER — BISOPROLOL-HYDROCHLOROTHIAZIDE 10-6.25 MG PO TABS
1.0000 | ORAL_TABLET | Freq: Every day | ORAL | Status: DC
  Administered 2021-05-25 – 2021-05-28 (×4): 1 via ORAL
  Filled 2021-05-25 (×4): qty 1

## 2021-05-25 NOTE — H&P (Signed)
Jonathon Bellows , MD [address], [place], [place], [place], [address] [address], [place], [place], [place], [address] Phone: [phone]  Fax: [phone]  Consultation  Referring Provider:   ER  Primary Care Physician:  Delsa Grana, PA-C Primary Gastroenterologist:  Dr. Bonna Gains         Reason for Consultation:     Diverticulitis  Date of Admission:  05/25/2021 Date of Consultation:  05/25/2021         HPI:   Holly Green is a 75 y.o. female is a patient of Dr. Bonna Gains at [facility] gastroenterology and was last seen at our office in July 2021.  She has been followed for constipation.  Colonoscopy in May 2021 by Dr. Cay Schillings pancolonic diverticulosis.  No polyps were seen.Underwent an EGD in March 2021 for gastric thickening on CT scan and the evaluation was normal except for a hiatal hernia.  Past history of diverticular bleed for and was hospitalized for the same in February 2021.  Diverticular bleed in 2015 seen on tagged RBC scan in the right colon.  Prior colonoscopy in 2018 showed pancolonic diverticulosis.  Prior coil embolization for diverticular bleed, hospitalization in 2009 for diverticular bleed with a positive tagged RBC scan showing a bleed in the mid transverse and descending colon.  This visit she was admitted early this morning with the presenting complaint of rectal bleeding that began last night.  She had recently been treated for acute diverticulitis and completed her antibiotic therapy yesterday. While on antibiotics she did have some bleeding but it was resolving.  On admission hemoglobin was 8.2 g compared to baseline of 12.4 g about a week back.  A CT scan of the abdomen and pelvis was completed which demonstrated uncomplicated diverticulitis of the splenic flexure.This morning hemoglobin is 7.6 g with an MCV of 95.  Last bowel movement was earlier today and was lesser in qty, she does have some abdominal cramping prior to a bowel movement. Denies any  use of NSAID's or blood thinners.     Past Medical History:  Diagnosis Date   Abdominal aortic atherosclerosis ([place]) 03/05/2017   CT scan April 2018   Arthritis    Bleeding disorder ([place])    Diverticulitis 2015   GERD (gastroesophageal reflux disease)    History of kidney stones    History of stomach ulcers    Hyperlipidemia    Hypertension    Osteoporosis    Pre-diabetes    Prediabetes 10/15/2015   Spondylolisthesis at L4-L5 level 03/07/2017    Past Surgical History:  Procedure Laterality Date   ABDOMINAL HYSTERECTOMY     BACK SURGERY  2010   BREAST CYST ASPIRATION Right 1990's   COLONOSCOPY Left 11/09/2017   Procedure: COLONOSCOPY;  Surgeon: Virgel Manifold, MD;  Location: [facility] ENDOSCOPY;  Service: Endoscopy;  Laterality: Left;   COLONOSCOPY WITH PROPOFOL N/A 04/14/2020   Procedure: COLONOSCOPY WITH PROPOFOL;  Surgeon: Virgel Manifold, MD;  Location: [facility] ENDOSCOPY;  Service: Endoscopy;  Laterality: N/A;   CYSTO WITH HYDRODISTENSION N/A 08/15/2017   Procedure: CYSTOSCOPY/HYDRODISTENSION;  Surgeon: Nickie Retort, MD;  Location: [facility] ORS;  Service: Urology;  Laterality: N/A;   ESOPHAGOGASTRODUODENOSCOPY Left 11/09/2017   Procedure: ESOPHAGOGASTRODUODENOSCOPY (EGD);  Surgeon: Virgel Manifold, MD;  Location: [facility] ENDOSCOPY;  Service: Endoscopy;  Laterality: Left;   ESOPHAGOGASTRODUODENOSCOPY (EGD) WITH PROPOFOL N/A 02/22/2020   Procedure: ESOPHAGOGASTRODUODENOSCOPY (EGD) WITH PROPOFOL;  Surgeon: Lucilla Lame, MD;  Location: [facility] ENDOSCOPY;  Service: Endoscopy;  Laterality:  N/A;   FOOT SURGERY Right     Prior to Admission medications   Medication Sig Start Date End Date Taking? Authorizing Provider  acetaminophen (TYLENOL) 650 MG CR tablet Take 650-1,300 mg by mouth every 8 (eight) hours as needed for pain.   Yes [provider]  bisoprolol-hydrochlorothiazide (ZIAC) 10-6.25 MG tablet Take 1 tablet by mouth daily. 06/14/20  Yes Delsa Grana, PA-C   Calcium Carb-Cholecalciferol (CALCIUM-VITAMIN D) 600-400 MG-UNIT TABS Take 1 tablet by mouth daily.   Yes [provider]  Certolizumab Pegol 2 X 200 MG/ML PSKT Inject 400 mg into the skin every 28 (twenty-eight) days. 05/01/21  Yes [provider]  folic acid (FOLVITE) 1 MG tablet Take 1 mg by mouth daily. 08/11/20 08/11/21 Yes [provider]  loratadine (CLARITIN) 10 MG tablet Take 1 tablet (10 mg total) by mouth daily. 10/26/19  Yes Hubbard Hartshorn, FNP  losartan (COZAAR) 50 MG tablet Take 1 tablet (50 mg total) by mouth daily. 06/14/20  Yes Delsa Grana, PA-C  lovastatin (MEVACOR) 40 MG tablet Take 1 tablet (40 mg total) by mouth at bedtime. 06/14/20  Yes Delsa Grana, PA-C  methotrexate 2.5 MG tablet Take 10 mg by mouth once a week. Pt takes every sunday 09/07/20  Yes [provider]  Multiple Vitamin (MULTI-VITAMIN) tablet Take 1 tablet by mouth daily.   Yes [provider]  polyethylene glycol (MIRALAX) 17 g packet Take 17 g by mouth daily. 01/18/20  Yes Virgel Manifold, MD    Family History  Problem Relation Age of Onset   Hypertension Brother    Heart disease Brother    Heart failure Brother    Hypertension Mother    Stroke Mother    Stroke Father    Prostate cancer Father    Aneurysm Sister    Breast cancer Sister 74   Liver disease Maternal Aunt    Heart failure Paternal Aunt    Heart disease Paternal Uncle    Heart attack Brother    Heart disease Brother    Breast cancer Maternal Aunt    Heart disease Paternal Aunt    Kidney cancer Neg Hx    Bladder Cancer Neg Hx      Social History   Tobacco Use   Smoking status: Never   Smokeless tobacco: Never  Vaping Use   Vaping Use: Never used  Substance Use Topics   Alcohol use: No    Alcohol/week: 0.0 standard drinks   Drug use: No    Allergies as of 05/25/2021 - Review Complete 05/25/2021  Allergen Reaction Noted   Aspirin Other (See Comments) 08/08/2017   Lisinopril  Cough 06/14/2019    Review of Systems:    All systems reviewed and negative except where noted in HPI.   Physical Exam:  Vital signs in last 24 hours: Temp:  [97.9 F (36.6 C)-98.3 F (36.8 C)] 98.3 F (36.8 C) (07/01 0609) Pulse Rate:  [66-101] 66 (07/01 0700) Resp:  [13-27] 13 (07/01 0700) BP: (105-144)/(57-87) 105/57 (07/01 0700) SpO2:  [100 %] 100 % (07/01 0700) Weight:  [88 kg] 88 kg (07/01 0050)   General:   Pleasant, cooperative in NAD Head:  Normocephalic and atraumatic. Eyes:   No icterus.   Conjunctiva pink. PERRLA. Ears:  Normal auditory acuity. Neck:  Supple; no masses or thyroidomegaly Lungs: Respirations even and unlabored. Lungs clear to auscultation bilaterally.   No wheezes, crackles, or rhonchi.  Heart:  Regular rate and rhythm;  Without murmur, clicks,  rubs or gallops Abdomen:  Soft, nondistended, nontender. Normal bowel sounds. No appreciable masses or hepatomegaly.  No rebound or guarding.  Neurologic:  Alert and oriented x3;  grossly normal neurologically. Psych:  Alert and cooperative. Normal affect.  LAB RESULTS: Recent Labs    05/25/21 0053 05/25/21 0320  WBC 6.4 7.2  HGB 8.2* 7.6*  HCT 24.0* 22.7*  PLT 216 177   BMET Recent Labs    05/25/21 0053 05/25/21 0320  NA 139 140  K 3.8 4.7  CL 107 107  CO2 25 28  GLUCOSE 131* 122*  BUN 29* 27*  CREATININE 0.88 0.86  CALCIUM 9.0 8.7*   LFT Recent Labs    05/25/21 0053  PROT 6.8  ALBUMIN 3.6  AST 41  ALT 37  ALKPHOS 71  BILITOT 0.8   PT/INR Recent Labs    05/25/21 0219  LABPROT 13.3  INR 1.0    STUDIES: No results found.    Impression / Plan:   Holly Green is a 75 y.o. y/o female completed a course of antibiotics for splenic flexure diverticulitis yesterday presented to the emergency room with a history of rectal bleeding of 1 day duration and over 4 g drop in hemoglobin from baseline.  She has a history of pancolonic diverticulitis as seen on her colonoscopy in 2021.   She has a history of multiple hospitalizations for a diverticular bleed since 2009.  Has at some point received coil embolization for active bleeding during hospitalization.  Plan 1.  Monitor CBC and transfuse as needed. 2.  If there is evidence of recurrence of diverticular bleed then would suggest to obtain either a tagged RBC scan or CT angiogram.  If there is an active bleed it should be followed by vascular surgery consult for embolization.  From the GI point of view a colonoscopy is contraindicated in the setting of active diverticulitis which she has and hence no GI procedures would be possible on the colon.Other options if there is recurrence of bleed is to consult surgery to discuss if colectomy is to be considered due to recurrence of this issue.    I will sign off.  Please call me if any further GI concerns or questions.  We would like to thank you for the opportunity to participate in the care of Holly Green.  Thank you for involving me in the care of this patient.      LOS: 0 days   Jonathon Bellows, MD  05/25/2021, 8:51 AM

## 2021-05-25 NOTE — ED Notes (Signed)
Lab into draw type and screen.

## 2021-05-25 NOTE — ED Provider Notes (Signed)
[facility] Emergency Department Provider Note  ____________________________________________   Event Date/Time   First MD Initiated Contact with Patient 05/25/21 0147     (approximate)  I have reviewed the triage vital signs and the nursing notes.   HISTORY  Chief Complaint Rectal Bleeding    HPI Holly Green is a 75 y.o. female with history of hypertension, hyperlipidemia, prediabetes, diverticulosis, stomach ulcers who presents to the emergency department with complaints of diffuse crampy abdominal pain that has been intermittent and hematochezia.  Was seen in the emergency department on 05/19/2021 for similar symptoms and was found to have diverticulitis.  Discharged home on antibiotics which she finished yesterday.  States she started having multiple episodes of bright red blood per rectum tonight and has been passing clots.  No melena.  No nausea or vomiting.  No pain currently.  Not on blood thinners.  States she felt "shaky" at work tonight.  No chest pain, shortness of breath or dizziness.        Past Medical History:  Diagnosis Date   Abdominal aortic atherosclerosis ([place]) 03/05/2017   CT scan April 2018   Arthritis    Bleeding disorder ([place])    Diverticulitis 2015   GERD (gastroesophageal reflux disease)    History of kidney stones    History of stomach ulcers    Hyperlipidemia    Hypertension    Osteoporosis    Pre-diabetes    Prediabetes 10/15/2015   Spondylolisthesis at L4-L5 level 03/07/2017    Patient Active Problem List   Diagnosis Date Noted   GI bleeding 05/25/2021   Acute cystitis with hematuria 09/29/2020   Chronic cough 08/17/2020   Rheumatoid arthritis of multiple sites with negative rheumatoid factor ([place]) 06/09/2020   Special screening for malignant neoplasms, colon    Abnormal CT scan, gastrointestinal tract    Gastrointestinal hemorrhage 12/29/2019   Left hip pain 10/15/2019   Obesity (BMI 30.0-34.9) 08/10/2018    Emphysema of lung ([place]) 08/10/2018   Allergic rhinitis 08/10/2018   COAD (chronic obstructive airways disease) ([place]) 08/03/2018   Primary osteoarthritis of right knee 07/17/2018   Chondrocalcinosis 07/17/2018   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, of knee, 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   Anemia 12/17/2017   Diverticulosis of large intestine without diverticulitis    History of GI bleed 11/06/2017   Left flank pain, chronic 03/07/2017   Spondylolisthesis at L4-L5 level 03/07/2017   Abdominal aortic atherosclerosis ([place]) 03/05/2017   Simple cyst of kidney 03/05/2017   Benign liver cyst 03/05/2017   Medication monitoring encounter 11/22/2016   GERD (gastroesophageal reflux disease) 08/07/2016   Osteoporosis 05/07/2016   Cardiac murmur 05/07/2016   Prediabetes 10/15/2015   Hyperlipidemia 10/15/2015   Essential hypertension 10/15/2015   Diverticulosis of colon, acquired 10/15/2015    Past Surgical History:  Procedure Laterality Date   ABDOMINAL HYSTERECTOMY     BACK SURGERY  2010   BREAST CYST ASPIRATION Right 1990's   COLONOSCOPY Left 11/09/2017   Procedure: COLONOSCOPY;  Surgeon: Virgel Manifold, MD;  Location: [facility] ENDOSCOPY;  Service: Endoscopy;  Laterality: Left;   COLONOSCOPY WITH PROPOFOL N/A 04/14/2020   Procedure: COLONOSCOPY WITH PROPOFOL;  Surgeon: Virgel Manifold, MD;  Location: [facility] ENDOSCOPY;  Service: Endoscopy;  Laterality: N/A;   CYSTO WITH HYDRODISTENSION N/A 08/15/2017   Procedure: CYSTOSCOPY/HYDRODISTENSION;  Surgeon: Nickie Retort, MD;  Location: [facility] ORS;  Service: Urology;  Laterality: N/A;   ESOPHAGOGASTRODUODENOSCOPY Left 11/09/2017   Procedure: ESOPHAGOGASTRODUODENOSCOPY (EGD);  Surgeon: Virgel Manifold,  MD;  Location: [facility] ENDOSCOPY;  Service: Endoscopy;  Laterality: Left;   ESOPHAGOGASTRODUODENOSCOPY (EGD) WITH PROPOFOL N/A 02/22/2020   Procedure: ESOPHAGOGASTRODUODENOSCOPY (EGD) WITH PROPOFOL;  Surgeon: Lucilla Lame, MD;  Location: [facility]  ENDOSCOPY;  Service: Endoscopy;  Laterality: N/A;   FOOT SURGERY Right     Prior to Admission medications   Medication Sig Start Date End Date Taking? Authorizing Provider  acetaminophen (TYLENOL) 650 MG CR tablet Take 650-1,300 mg by mouth every 8 (eight) hours as needed for pain.   Yes [provider]  bisoprolol-hydrochlorothiazide (ZIAC) 10-6.25 MG tablet Take 1 tablet by mouth daily. 06/14/20  Yes Delsa Grana, PA-C  Calcium Carb-Cholecalciferol (CALCIUM-VITAMIN D) 600-400 MG-UNIT TABS Take 1 tablet by mouth daily.   Yes [provider]  Certolizumab Pegol 2 X 200 MG/ML PSKT Inject 400 mg into the skin every 28 (twenty-eight) days. 05/01/21  Yes [provider]  folic acid (FOLVITE) 1 MG tablet Take 1 mg by mouth daily. 08/11/20 08/11/21 Yes [provider]  loratadine (CLARITIN) 10 MG tablet Take 1 tablet (10 mg total) by mouth daily. 10/26/19  Yes Hubbard Hartshorn, FNP  losartan (COZAAR) 50 MG tablet Take 1 tablet (50 mg total) by mouth daily. 06/14/20  Yes Delsa Grana, PA-C  lovastatin (MEVACOR) 40 MG tablet Take 1 tablet (40 mg total) by mouth at bedtime. 06/14/20  Yes Delsa Grana, PA-C  methotrexate 2.5 MG tablet Take 10 mg by mouth once a week. Pt takes every sunday 09/07/20  Yes [provider]  Multiple Vitamin (MULTI-VITAMIN) tablet Take 1 tablet by mouth daily.   Yes [provider]  polyethylene glycol (MIRALAX) 17 g packet Take 17 g by mouth daily. 01/18/20  Yes Virgel Manifold, MD    Allergies Aspirin and Lisinopril  Family History  Problem Relation Age of Onset   Hypertension Brother    Heart disease Brother    Heart failure Brother    Hypertension Mother    Stroke Mother    Stroke Father    Prostate cancer Father    Aneurysm Sister    Breast cancer Sister 45   Liver disease Maternal Aunt    Heart failure Paternal Aunt    Heart disease Paternal Uncle    Heart attack Brother    Heart disease Brother    Breast  cancer Maternal Aunt    Heart disease Paternal Aunt    Kidney cancer Neg Hx    Bladder Cancer Neg Hx     Social History Social History   Tobacco Use   Smoking status: Never   Smokeless tobacco: Never  Vaping Use   Vaping Use: Never used  Substance Use Topics   Alcohol use: No    Alcohol/week: 0.0 standard drinks   Drug use: No    Review of Systems Constitutional: No fever. Eyes: No visual changes. ENT: No sore throat. Cardiovascular: Denies chest pain. Respiratory: Denies shortness of breath. Gastrointestinal: No nausea, vomiting, diarrhea. Genitourinary: Negative for dysuria. Musculoskeletal: Negative for back pain. Skin: Negative for rash. Neurological: Negative for focal weakness or numbness.  ____________________________________________   PHYSICAL EXAM:  VITAL SIGNS: ED Triage Vitals  Enc Vitals Group     BP 05/25/21 0049 (!) 144/87     Pulse Rate 05/25/21 0049 (!) 101     Resp 05/25/21 0049 18     Temp 05/25/21 0049 98.1 F (36.7 C)     Temp Source 05/25/21 0049 Oral     SpO2 05/25/21 0049 100 %  Weight 05/25/21 0050 194 lb (88 kg)     Height 05/25/21 0050 5\' 6"  (1.676 m)     Head Circumference --      Peak Flow --      Pain Score 05/25/21 0050 0     Pain Loc --      Pain Edu? --      Excl. in [place]? --    CONSTITUTIONAL: Alert and oriented and responds appropriately to questions. Well-appearing; well-nourished HEAD: Normocephalic EYES: Conjunctivae clear, pupils appear equal, EOM appear intact ENT: normal nose; moist mucous membranes NECK: Supple, normal ROM CARD: Regular and minimally tachycardic; S1 and S2 appreciated; no murmurs, no clicks, no rubs, no gallops RESP: Normal chest excursion without splinting or tachypnea; breath sounds clear and equal bilaterally; no wheezes, no rhonchi, no rales, no hypoxia or respiratory distress, speaking full sentences ABD/GI: Normal bowel sounds; non-distended; soft, non-tender, no rebound, no guarding, no  peritoneal signs, no hepatosplenomegaly, no active rectal hemorrhaging BACK: The back appears normal EXT: Normal ROM in all joints; no deformity noted, no edema; no cyanosis SKIN: Normal color for age and race; warm; no rash on exposed skin NEURO: Moves all extremities equally PSYCH: The patient's mood and manner are appropriate.  ____________________________________________   LABS (all labs ordered are listed, but only abnormal results are displayed)  Labs Reviewed  CBC - Abnormal; Notable for the following components:      Result Value   RBC 2.58 (*)    Hemoglobin 8.2 (*)    HCT 24.0 (*)    All other components within normal limits  COMPREHENSIVE METABOLIC PANEL - Abnormal; Notable for the following components:   Glucose, Bld 131 (*)    BUN 29 (*)    All other components within normal limits  RESP PANEL BY RT-PCR (FLU A&B, COVID) ARPGX2  LIPASE, BLOOD  PROTIME-INR  URINALYSIS, COMPLETE (UACMP) WITH MICROSCOPIC  BASIC METABOLIC PANEL  CBC  HEMOGLOBIN AND HEMATOCRIT, BLOOD  HEMOGLOBIN AND HEMATOCRIT, BLOOD  HEMOGLOBIN AND HEMATOCRIT, BLOOD  TYPE AND SCREEN  PREPARE RBC (CROSSMATCH)  TYPE AND SCREEN   ____________________________________________  EKG   ____________________________________________  RADIOLOGY I, Rayquan Amrhein, personally viewed and evaluated these images (plain radiographs) as part of my medical decision making, as well as reviewing the written report by the radiologist.  ED MD interpretation:    Official radiology report(s): No results found.  ____________________________________________   PROCEDURES  Procedure(s) performed (including Critical Care):  Procedures  CRITICAL CARE Performed by: Pryor Curia   Total critical care time: 45 minutes  Critical care time was exclusive of separately billable procedures and treating other patients.  Critical care was necessary to treat or prevent imminent or life-threatening  deterioration.  Critical care was time spent personally by me on the following activities: development of treatment plan with patient and/or surrogate as well as nursing, discussions with consultants, evaluation of patient's response to treatment, examination of patient, obtaining history from patient or surrogate, ordering and performing treatments and interventions, ordering and review of laboratory studies, ordering and review of radiographic studies, pulse oximetry and re-evaluation of patient's condition.  ____________________________________________   INITIAL IMPRESSION / ASSESSMENT AND PLAN / ED COURSE  As part of my medical decision making, I reviewed the following data within the [place] notes reviewed and incorporated, Labs reviewed , Old chart reviewed, Discussed with admitting physician , and Notes from prior ED visits         Patient here with GI bleed  likely from diverticulosis, diverticulitis.  Abdominal exam benign.  Doubt any other complications from diverticulitis including perforation, abscess.  I do not feel she needs repeat CT imaging.  Will give IV Zosyn.  Her hemoglobin did drop from 12.4 down to 8.2 in 1 week.  Given she is actively bleeding, mildly tachycardic with significant drop in hemoglobin, will transfuse 1 unit of packed red blood cells.  No melena, hematemesis.  She declines pain or nausea medicine at this time.  We will keep NPO.  Will discuss with hospitalist for admission.  ED PROGRESS    2:30 AM  Discussed patient's case with hospitalist, Dr. Sidney Ace.  I have recommended admission and patient (and family if present) agree with this plan. Admitting physician will place admission orders.   I reviewed all nursing notes, vitals, pertinent previous records and reviewed/interpreted all EKGs, lab and urine results, imaging (as available).  ____________________________________________   FINAL CLINICAL IMPRESSION(S) / ED  DIAGNOSES  Final diagnoses:  Acute GI bleeding  Symptomatic anemia     ED Discharge Orders     None       *Please note:  Holly Green was evaluated in Emergency Department on 05/25/2021 for the symptoms described in the history of present illness. She was evaluated in the context of the global COVID-19 pandemic, which necessitated consideration that the patient might be at risk for infection with the SARS-CoV-2 virus that causes COVID-19. Institutional protocols and algorithms that pertain to the evaluation of patients at risk for COVID-19 are in a state of rapid change based on information released by regulatory bodies including the CDC and federal and state organizations. These policies and algorithms were followed during the patient's care in the ED.  Some ED evaluations and interventions may be delayed as a result of limited staffing during and the pandemic.*   Note:  This document was prepared using Dragon voice recognition software and may include unintentional dictation errors.    Pranish Akhavan, Delice Bison, DO 05/25/21 [phone]

## 2021-05-25 NOTE — ED Triage Notes (Signed)
Pt in with co rectal bleeding since Saturday, was seen here for the same and discharged home with dx of diverticulitis. States tonight started having rectal bleeding again, x 4 episodes tonight.

## 2021-05-25 NOTE — ED Notes (Signed)
Vital signs stable.  Patient resting well.  Blood infusing.  RN at bedside.

## 2021-05-25 NOTE — Progress Notes (Signed)
Patient complains of chest pain after going to bathroom. Complained of mild nausea and back pain then her chest start hurting and says she had some palpitations.  Called telemetry, PVCs noted.   EKG done,  sinus rhythm with Twave abnormality. Vitals done, Blood pressure elevated 179/85 (110), HR 72.  PRN labetalol given for blood pressure.   EKG on chart.  Physician notified, see new orders

## 2021-05-25 NOTE — H&P (Addendum)
[place]   PATIENT NAME: Holly Green    MR#:  [id number]  DATE OF BIRTH:  [birth year]  DATE OF ADMISSION:  05/25/2021  PRIMARY CARE PHYSICIAN: Delsa Grana, PA-C   Patient is coming from: Home  REQUESTING/REFERRING PHYSICIAN: Ward, Delice Bison, DO  CHIEF COMPLAINT:   Chief Complaint  Patient presents with   Rectal Bleeding    HISTORY OF PRESENT ILLNESS:  Holly Green is a 75 y.o. African-American female with medical history significant for diverticulitis, GERD, peptic ulcer disease, hypertension, dyslipidemia, prediabetes and osteoarthritis, who presented to the emergency room with acute onset of recurrent bright red bleeding per rectum tonight.  She was feeling shaky per her report.  She was recently treated for acute diverticulitis and finished antibiotic therapy yesterday.  She admitted to abdominal cramps with her bowel movements.  No fever or chills.  No chest pain or dyspnea or cough or wheezing or hemoptysis.  No dysuria, oliguria or hematuria or flank pain.  No other bleeding diathesis.  ED Course: Upon presentation to the emergency room blood pressure was 144/87 with heart rate 101 and otherwise normal vital signs.  Labs revealed hemoglobin of 8.2 and hematocrit 24 compared to 12.4 and 36.9 on 05/19/2021.  BUN was 29 up from 17 then and creatinine 0.88.    Imaging: Abdominal pelvic CT scan on 6/25 revealed mild uncomplicated diverticulitis of the splenic flexure with no abscess and no other abdominal abnormalities.  It showed pancolonic diverticulosis and aortic atherosclerosis.  The patient was given IV Zosyn and hydration with IV normal saline.  The patient will be admitted to a progressive unit bed for further evaluation and management.  PAST MEDICAL HISTORY:   Past Medical History:  Diagnosis Date   Abdominal aortic atherosclerosis ([place]) 03/05/2017   CT scan April 2018   Arthritis    Bleeding disorder ([place])    Diverticulitis 2015   GERD (gastroesophageal  reflux disease)    History of kidney stones    History of stomach ulcers    Hyperlipidemia    Hypertension    Osteoporosis    Pre-diabetes    Prediabetes 10/15/2015   Spondylolisthesis at L4-L5 level 03/07/2017    PAST SURGICAL HISTORY:   Past Surgical History:  Procedure Laterality Date   ABDOMINAL HYSTERECTOMY     BACK SURGERY  2010   BREAST CYST ASPIRATION Right 1990's   COLONOSCOPY Left 11/09/2017   Procedure: COLONOSCOPY;  Surgeon: Virgel Manifold, MD;  Location: [facility] ENDOSCOPY;  Service: Endoscopy;  Laterality: Left;   COLONOSCOPY WITH PROPOFOL N/A 04/14/2020   Procedure: COLONOSCOPY WITH PROPOFOL;  Surgeon: Virgel Manifold, MD;  Location: [facility] ENDOSCOPY;  Service: Endoscopy;  Laterality: N/A;   CYSTO WITH HYDRODISTENSION N/A 08/15/2017   Procedure: CYSTOSCOPY/HYDRODISTENSION;  Surgeon: Nickie Retort, MD;  Location: [facility] ORS;  Service: Urology;  Laterality: N/A;   ESOPHAGOGASTRODUODENOSCOPY Left 11/09/2017   Procedure: ESOPHAGOGASTRODUODENOSCOPY (EGD);  Surgeon: Virgel Manifold, MD;  Location: [facility] ENDOSCOPY;  Service: Endoscopy;  Laterality: Left;   ESOPHAGOGASTRODUODENOSCOPY (EGD) WITH PROPOFOL N/A 02/22/2020   Procedure: ESOPHAGOGASTRODUODENOSCOPY (EGD) WITH PROPOFOL;  Surgeon: Lucilla Lame, MD;  Location: [facility] ENDOSCOPY;  Service: Endoscopy;  Laterality: N/A;   FOOT SURGERY Right     SOCIAL HISTORY:   Social History   Tobacco Use   Smoking status: Never   Smokeless tobacco: Never  Substance Use Topics   Alcohol use: No    Alcohol/week: 0.0 standard drinks    FAMILY HISTORY:  Family History  Problem Relation Age of Onset   Hypertension Brother    Heart disease Brother    Heart failure Brother    Hypertension Mother    Stroke Mother    Stroke Father    Prostate cancer Father    Aneurysm Sister    Breast cancer Sister 67   Liver disease Maternal Aunt    Heart failure Paternal Aunt    Heart disease Paternal Uncle    Heart attack  Brother    Heart disease Brother    Breast cancer Maternal Aunt    Heart disease Paternal Aunt    Kidney cancer Neg Hx    Bladder Cancer Neg Hx     DRUG ALLERGIES:   Allergies  Allergen Reactions   Aspirin Other (See Comments)    Diverticulosis   Lisinopril Cough    REVIEW OF SYSTEMS:   ROS As per history of present illness. All pertinent systems were reviewed above. Constitutional, HEENT, cardiovascular, respiratory, GI, GU, musculoskeletal, neuro, psychiatric, endocrine, integumentary and hematologic systems were reviewed and are otherwise negative/unremarkable except for positive findings mentioned above in the HPI.   MEDICATIONS AT HOME:   Prior to Admission medications   Medication Sig Start Date End Date Taking? Authorizing Provider  acetaminophen (TYLENOL) 650 MG CR tablet Take 650-1,300 mg by mouth every 8 (eight) hours as needed for pain.   Yes [provider]  bisoprolol-hydrochlorothiazide (ZIAC) 10-6.25 MG tablet Take 1 tablet by mouth daily. 06/14/20  Yes Delsa Grana, PA-C  Calcium Carb-Cholecalciferol (CALCIUM-VITAMIN D) 600-400 MG-UNIT TABS Take 1 tablet by mouth daily.   Yes [provider]  Certolizumab Pegol 2 X 200 MG/ML PSKT Inject 400 mg into the skin every 28 (twenty-eight) days. 05/01/21  Yes [provider]  folic acid (FOLVITE) 1 MG tablet Take 1 mg by mouth daily. 08/11/20 08/11/21 Yes [provider]  loratadine (CLARITIN) 10 MG tablet Take 1 tablet (10 mg total) by mouth daily. 10/26/19  Yes Hubbard Hartshorn, FNP  losartan (COZAAR) 50 MG tablet Take 1 tablet (50 mg total) by mouth daily. 06/14/20  Yes Delsa Grana, PA-C  lovastatin (MEVACOR) 40 MG tablet Take 1 tablet (40 mg total) by mouth at bedtime. 06/14/20  Yes Delsa Grana, PA-C  methotrexate 2.5 MG tablet Take 10 mg by mouth once a week. Pt takes every sunday 09/07/20  Yes [provider]  Multiple Vitamin (MULTI-VITAMIN) tablet Take 1 tablet by mouth daily.    Yes [provider]  polyethylene glycol (MIRALAX) 17 g packet Take 17 g by mouth daily. 01/18/20  Yes Virgel Manifold, MD      VITAL SIGNS:  Blood pressure (!) 144/87, pulse (!) 101, temperature 98.1 F (36.7 C), temperature source Oral, resp. rate 18, height 5\' 6"  (1.676 m), weight 88 kg, SpO2 100 %.  PHYSICAL EXAMINATION:  Physical Exam  GENERAL:  75 y.o.-year-old African-American female patient lying in the bed with no acute distress.  EYES: Pupils equal, round, reactive to light and accommodation. No scleral icterus. Extraocular muscles intact.  HEENT: Head atraumatic, normocephalic. Oropharynx and nasopharynx clear.  NECK:  Supple, no jugular venous distention. No thyroid enlargement, no tenderness.  LUNGS: Normal breath sounds bilaterally, no wheezing, rales,rhonchi or crepitation. No use of accessory muscles of respiration.  CARDIOVASCULAR: Regular rate and rhythm, S1, S2 normal. No murmurs, rubs, or gallops.  ABDOMEN: Soft, nondistended, nontender. Bowel sounds present. No organomegaly or mass.  EXTREMITIES: No pedal edema, cyanosis, or clubbing.  NEUROLOGIC:  Cranial nerves II through XII are intact. Muscle strength 5/5 in all extremities. Sensation intact. Gait not checked.  PSYCHIATRIC: The patient is alert and oriented x 3.  Normal affect and good eye contact. SKIN: No obvious rash, lesion, or ulcer.   LABORATORY PANEL:   CBC Recent Labs  Lab 05/25/21 0053  WBC 6.4  HGB 8.2*  HCT 24.0*  PLT 216   ------------------------------------------------------------------------------------------------------------------  Chemistries  Recent Labs  Lab 05/25/21 0053  NA 139  K 3.8  CL 107  CO2 25  GLUCOSE 131*  BUN 29*  CREATININE 0.88  CALCIUM 9.0  AST 41  ALT 37  ALKPHOS 71  BILITOT 0.8   ------------------------------------------------------------------------------------------------------------------  Cardiac Enzymes No results for input(s):  TROPONINI in the last 168 hours. ------------------------------------------------------------------------------------------------------------------  RADIOLOGY:  No results found.    IMPRESSION AND PLAN:  Active Problems:   GI bleeding  1.  Acute diverticulitis with recurrent bright red bleeding per rectum. - The patient will be admitted to a progressive unit. - She will be hydrated with IV normal saline. - We will follow serial hemoglobins and hematocrits. - We will continue antibiotic therapy with IV Zosyn. - GI consultation will be obtained. - I notified Dr. Allen Norris about the patient.  2.  Acute blood loss anemia. - The patient was typed and crossmatched and will be transfused 1 unit of packed red blood cells.  3.  Hypertensive urgency. - We will continue antihypertensives and place the patient on as needed IV labetalol.  4.  Dyslipidemia. - We will continue statin therapy.  DVT prophylaxis: SCDs.  Medical prophylaxis currently contraindicated due to GI bleeding.   Code Status: full code. Family Communication:  The plan of care was discussed in details with the patient (and family). I answered all questions. The patient agreed to proceed with the above mentioned plan. Further management will depend upon hospital course. Disposition Plan: Back to previous home environment Consults called: Gastroenterology consult. All the records are reviewed and case discussed with ED provider.  Status is: Inpatient  Remains inpatient appropriate because:Ongoing diagnostic testing needed not appropriate for outpatient work up, Unsafe d/c plan, IV treatments appropriate due to intensity of illness or inability to take PO, and Inpatient level of care appropriate due to severity of illness  Dispo: The patient is from: Home              Anticipated d/c is to: Home              Patient currently is not medically stable to d/c.   Difficult to place patient No  TOTAL TIME TAKING CARE OF THIS  PATIENT: 55 minutes.    Christel Mormon M.D on 05/25/2021 at 3:05 AM  [facility]   From 7 PM-7 AM, contact night-coverage [email or web address]  CC: Primary care physician; Delsa Grana, PA-C

## 2021-05-25 NOTE — Progress Notes (Signed)
PROGRESS NOTE    Holly Green  VVO:[id number] DOB: February 07, 1946 DOA: 05/25/2021 PCP: Delsa Grana, PA-C    Assessment & Plan:   Active Problems:   GI bleeding   Acute diverticulitis: w/ recurrent bright red bleeding per rectum. S/p 1 unit of pRBCs transfused. If bleeding occurs again will get tagged RBC scan and possible consult vascular surg. D/c IV zosyn and start flagyl. GI eval pt and signed off.   Acute blood loss anemia: secondary to above. S/p 1 unit of pRBCs transfused. Repeat H&H 4 hours post transfusion  HTN urgency: continue on losartan, bisoprolol-HTCZ. IV labetalol prn   HLD: continue on statin    DVT prophylaxis: SCDs Code Status: full  Family Communication:  Disposition Plan: likely d/c back home   Level of care: Progressive Cardiac  Status is: Inpatient  Remains inpatient appropriate because:IV treatments appropriate due to intensity of illness or inability to take PO and Inpatient level of care appropriate due to severity of illness  Dispo: The patient is from: Home              Anticipated d/c is to: Home              Patient currently is not medically stable to d/c.   Difficult to place patient : unclear    Consultants:  GI   Procedures:   Antimicrobials: flagyl   Subjective:  Pt c/o abd pain    Objective: Vitals:   05/25/21 0545 05/25/21 0609 05/25/21 0630 05/25/21 0700  BP: 124/66 120/68 118/74 (!) 105/57  Pulse: 74 78 66 66  Resp: 18 18 (!) 27 13  Temp: 97.9 F (36.6 C) 98.3 F (36.8 C)    TempSrc: Oral Oral    SpO2: 100% 100% 100% 100%  Weight:      Height:        Intake/Output Summary (Last 24 hours) at 05/25/2021 0752 Last data filed at 05/25/2021 0601 Gross per 24 hour  Intake 50 ml  Output --  Net 50 ml   Filed Weights   05/25/21 0050  Weight: 88 kg    Examination:  General exam: Appears calm and comfortable  Respiratory system: Clear to auscultation. Respiratory effort normal. Cardiovascular system: S1 & S2 +. No   rubs, gallops or clicks. Gastrointestinal system: Abdomen is nondistended, soft and tenderness to palpation. . Normal bowel sounds heard. Central nervous system: Alert and oriented. Moves all extremities Psychiatry: Judgement and insight appear normal. Mood & affect appropriate.     Data Reviewed: I have personally reviewed following labs and imaging studies  CBC: Recent Labs  Lab 05/19/21 1059 05/25/21 0053 05/25/21 0320  WBC 5.7 6.4 7.2  NEUTROABS 3.3  --   --   HGB 12.4 8.2* 7.6*  HCT 36.9 24.0* 22.7*  MCV 93.4 93.0 95.0  PLT 197 216 269   Basic Metabolic Panel: Recent Labs  Lab 05/19/21 1059 05/25/21 0053 05/25/21 0320  NA 136 139 140  K 4.4 3.8 4.7  CL 103 107 107  CO2 26 25 28   GLUCOSE 89 131* 122*  BUN 17 29* 27*  CREATININE 0.61 0.88 0.86  CALCIUM 8.9 9.0 8.7*   GFR: Estimated Creatinine Clearance: 65.1 mL/min (by C-G formula based on SCr of 0.86 mg/dL). Liver Function Tests: Recent Labs  Lab 05/19/21 1059 05/25/21 0053  AST 41 41  ALT 21 37  ALKPHOS 69 71  BILITOT 1.5* 0.8  PROT 7.4 6.8  ALBUMIN 3.9 3.6   Recent Labs  Lab 05/19/21 1059 05/25/21 0053  LIPASE 45 38   No results for input(s): AMMONIA in the last 168 hours. Coagulation Profile: Recent Labs  Lab 05/25/21 0219  INR 1.0   Cardiac Enzymes: No results for input(s): CKTOTAL, CKMB, CKMBINDEX, TROPONINI in the last 168 hours. BNP (last 3 results) No results for input(s): PROBNP in the last 8760 hours. HbA1C: No results for input(s): HGBA1C in the last 72 hours. CBG: No results for input(s): GLUCAP in the last 168 hours. Lipid Profile: No results for input(s): CHOL, HDL, LDLCALC, TRIG, CHOLHDL, LDLDIRECT in the last 72 hours. Thyroid Function Tests: No results for input(s): TSH, T4TOTAL, FREET4, T3FREE, THYROIDAB in the last 72 hours. Anemia Panel: No results for input(s): VITAMINB12, FOLATE, FERRITIN, TIBC, IRON, RETICCTPCT in the last 72 hours. Sepsis Labs: No results for  input(s): PROCALCITON, LATICACIDVEN in the last 168 hours.  Recent Results (from the past 240 hour(s))  Resp Panel by RT-PCR (Flu A&B, Covid) Nasopharyngeal Swab     Status: None   Collection Time: 05/25/21  2:19 AM   Specimen: Nasopharyngeal Swab; Nasopharyngeal(NP) swabs in vial transport medium  Result Value Ref Range Status   SARS Coronavirus 2 by RT PCR NEGATIVE NEGATIVE Final    Comment: (NOTE) SARS-CoV-2 target nucleic acids are NOT DETECTED.  The SARS-CoV-2 RNA is generally detectable in upper respiratory specimens during the acute phase of infection. The lowest concentration of SARS-CoV-2 viral copies this assay can detect is 138 copies/mL. A negative result does not preclude SARS-Cov-2 infection and should not be used as the sole basis for treatment or other patient management decisions. A negative result may occur with  improper specimen collection/handling, submission of specimen other than nasopharyngeal swab, presence of viral mutation(s) within the areas targeted by this assay, and inadequate number of viral copies(<138 copies/mL). A negative result must be combined with clinical observations, patient history, and epidemiological information. The expected result is Negative.  Fact Sheet for Patients:  EntrepreneurPulse.com.au  Fact Sheet for Healthcare Providers:  IncredibleEmployment.be  This test is no t yet approved or cleared by the [place] FDA and  has been authorized for detection and/or diagnosis of SARS-CoV-2 by FDA under an Emergency Use Authorization (EUA). This EUA will remain  in effect (meaning this test can be used) for the duration of the COVID-19 declaration under Section 564(b)(1) of the Act, 21 U.S.C.section 360bbb-3(b)(1), unless the authorization is terminated  or revoked sooner.       Influenza A by PCR NEGATIVE NEGATIVE Final   Influenza B by PCR NEGATIVE NEGATIVE Final    Comment: (NOTE) The  Xpert Xpress SARS-CoV-2/FLU/RSV plus assay is intended as an aid in the diagnosis of influenza from Nasopharyngeal swab specimens and should not be used as a sole basis for treatment. Nasal washings and aspirates are unacceptable for Xpert Xpress SARS-CoV-2/FLU/RSV testing.  Fact Sheet for Patients: EntrepreneurPulse.com.au  Fact Sheet for Healthcare Providers: IncredibleEmployment.be  This test is not yet approved or cleared by the [place] FDA and has been authorized for detection and/or diagnosis of SARS-CoV-2 by FDA under an Emergency Use Authorization (EUA). This EUA will remain in effect (meaning this test can be used) for the duration of the COVID-19 declaration under Section 564(b)(1) of the Act, 21 U.S.C. section 360bbb-3(b)(1), unless the authorization is terminated or revoked.  Performed at [facility], [address] [place], [place] [address]          Radiology Studies: No results found.  Scheduled Meds:  bisoprolol-hydrochlorothiazide  1 tablet Oral Daily   calcium-vitamin D  1 tablet Oral Daily   folic acid  1 mg Oral Daily   loratadine  10 mg Oral Daily   losartan  50 mg Oral Daily   [START ON 05/27/2021] methotrexate  10 mg Oral Weekly   multivitamin with minerals  1 tablet Oral Daily   pravastatin  40 mg Oral q1800   Continuous Infusions:  sodium chloride 100 mL/hr at 05/25/21 0253   sodium chloride     sodium chloride Stopped (05/25/21 0601)   piperacillin-tazobactam (ZOSYN)  IV       LOS: 0 days    Time spent: 34 mins    Wyvonnia Dusky, MD [facility] Pager [phone]  If 7PM-7AM, please contact night-coverage 05/25/2021, 7:52 AM

## 2021-05-25 NOTE — ED Notes (Addendum)
RN called lab to ask for phlebotomy to draw type and screen. Difficult stick.

## 2021-05-25 NOTE — Plan of Care (Signed)
  Problem: Education: Goal: Knowledge of General Education information will improve Description: Including pain rating scale, medication(s)/side effects and non-pharmacologic comfort measures 05/25/2021 1451 by Cristela Blue, RN Outcome: Progressing 05/25/2021 1450 by Cristela Blue, RN Outcome: Progressing   Problem: Health Behavior/Discharge Planning: Goal: Ability to manage health-related needs will improve 05/25/2021 1451 by Cristela Blue, RN Outcome: Progressing 05/25/2021 1450 by Cristela Blue, RN Outcome: Progressing   Problem: Clinical Measurements: Goal: Ability to maintain clinical measurements within normal limits will improve 05/25/2021 1451 by Cristela Blue, RN Outcome: Progressing 05/25/2021 1450 by Cristela Blue, RN Outcome: Progressing Goal: Will remain free from infection 05/25/2021 1451 by Cristela Blue, RN Outcome: Progressing 05/25/2021 1450 by Cristela Blue, RN Outcome: Progressing Goal: Diagnostic test results will improve 05/25/2021 1451 by Cristela Blue, RN Outcome: Progressing 05/25/2021 1450 by Cristela Blue, RN Outcome: Progressing Goal: Respiratory complications will improve 05/25/2021 1451 by Cristela Blue, RN Outcome: Progressing 05/25/2021 1450 by Cristela Blue, RN Outcome: Progressing Goal: Cardiovascular complication will be avoided 05/25/2021 1451 by Cristela Blue, RN Outcome: Progressing 05/25/2021 1450 by Cristela Blue, RN Outcome: Progressing   Problem: Activity: Goal: Risk for activity intolerance will decrease 05/25/2021 1451 by Cristela Blue, RN Outcome: Progressing 05/25/2021 1450 by Cristela Blue, RN Outcome: Progressing   Problem: Nutrition: Goal: Adequate nutrition will be maintained 05/25/2021 1451 by Cristela Blue, RN Outcome: Progressing 05/25/2021 1450 by Cristela Blue, RN Outcome: Progressing   Problem: Coping: Goal: Level of anxiety will decrease 05/25/2021 1451 by Cristela Blue, RN Outcome: Progressing 05/25/2021 1450 by  Cristela Blue, RN Outcome: Progressing   Problem: Elimination: Goal: Will not experience complications related to bowel motility 05/25/2021 1451 by Cristela Blue, RN Outcome: Progressing 05/25/2021 1450 by Cristela Blue, RN Outcome: Progressing Goal: Will not experience complications related to urinary retention 05/25/2021 1451 by Cristela Blue, RN Outcome: Progressing 05/25/2021 1450 by Cristela Blue, RN Outcome: Progressing   Problem: Pain Managment: Goal: General experience of comfort will improve 05/25/2021 1451 by Cristela Blue, RN Outcome: Progressing 05/25/2021 1450 by Cristela Blue, RN Outcome: Progressing   Problem: Safety: Goal: Ability to remain free from injury will improve 05/25/2021 1451 by Cristela Blue, RN Outcome: Progressing 05/25/2021 1450 by Cristela Blue, RN Outcome: Progressing   Problem: Skin Integrity: Goal: Risk for impaired skin integrity will decrease 05/25/2021 1451 by Cristela Blue, RN Outcome: Progressing 05/25/2021 1450 by Cristela Blue, RN Outcome: Progressing   Problem: Education: Goal: Ability to identify signs and symptoms of gastrointestinal bleeding will improve 05/25/2021 1451 by Cristela Blue, RN Outcome: Progressing 05/25/2021 1450 by Cristela Blue, RN Outcome: Progressing   Problem: Bowel/Gastric: Goal: Will show no signs and symptoms of gastrointestinal bleeding 05/25/2021 1451 by Cristela Blue, RN Outcome: Progressing 05/25/2021 1450 by Cristela Blue, RN Outcome: Progressing   Problem: Fluid Volume: Goal: Will show no signs and symptoms of excessive bleeding 05/25/2021 1451 by Cristela Blue, RN Outcome: Progressing 05/25/2021 1450 by Cristela Blue, RN Outcome: Progressing   Problem: Clinical Measurements: Goal: Complications related to the disease process, condition or treatment will be avoided or minimized 05/25/2021 1451 by Cristela Blue, RN Outcome: Progressing 05/25/2021 1450 by Cristela Blue, RN Outcome: Progressing

## 2021-05-26 DIAGNOSIS — I10 Essential (primary) hypertension: Secondary | ICD-10-CM

## 2021-05-26 DIAGNOSIS — D62 Acute posthemorrhagic anemia: Secondary | ICD-10-CM

## 2021-05-26 LAB — BPAM RBC
Blood Product Expiration Date: 202207282359
Blood Product Expiration Date: 202207282359
ISSUE DATE / TIME: 202207010540
ISSUE DATE / TIME: 202207011133
Unit Type and Rh: 7300
Unit Type and Rh: 7300

## 2021-05-26 LAB — TYPE AND SCREEN
ABO/RH(D): B POS
Antibody Screen: NEGATIVE
Unit division: 0
Unit division: 0

## 2021-05-26 LAB — CBC
HCT: 24.8 % — ABNORMAL LOW (ref 36.0–46.0)
Hemoglobin: 8.6 g/dL — ABNORMAL LOW (ref 12.0–15.0)
MCH: 31.5 pg (ref 26.0–34.0)
MCHC: 34.7 g/dL (ref 30.0–36.0)
MCV: 90.8 fL (ref 80.0–100.0)
Platelets: 167 10*3/uL (ref 150–400)
RBC: 2.73 MIL/uL — ABNORMAL LOW (ref 3.87–5.11)
RDW: 14.3 % (ref 11.5–15.5)
WBC: 5.3 10*3/uL (ref 4.0–10.5)
nRBC: 0 % (ref 0.0–0.2)

## 2021-05-26 LAB — BASIC METABOLIC PANEL
Anion gap: 4 — ABNORMAL LOW (ref 5–15)
BUN: 13 mg/dL (ref 8–23)
CO2: 29 mmol/L (ref 22–32)
Calcium: 8.7 mg/dL — ABNORMAL LOW (ref 8.9–10.3)
Chloride: 108 mmol/L (ref 98–111)
Creatinine, Ser: 0.63 mg/dL (ref 0.44–1.00)
GFR, Estimated: >60 mL/min (ref >60)
Glucose, Bld: 98 mg/dL (ref 70–99)
Potassium: 3.7 mmol/L (ref 3.5–5.1)
Sodium: 141 mmol/L (ref 135–145)

## 2021-05-26 LAB — HEMOGLOBIN AND HEMATOCRIT, BLOOD
HCT: 26.9 % — ABNORMAL LOW (ref 36.0–46.0)
Hemoglobin: 9.1 g/dL — ABNORMAL LOW (ref 12.0–15.0)

## 2021-05-26 NOTE — Plan of Care (Signed)

## 2021-05-26 NOTE — Progress Notes (Signed)
PROGRESS NOTE    Holly Green  SEG:[id number] DOB: 1946/06/25 DOA: 05/25/2021 PCP: Delsa Grana, PA-C    Assessment & Plan:   Active Problems:   GI bleeding   Acute diverticulitis: w/ recurrent bright red bleeding per rectum. S/p 1 unit of pRBCs transfused. If bleeding occurs again will get tagged RBC scan and possible consult vascular surg. Continue on IV flagyl and rocephin. Small amount of blood in stool today. GI eval pt and signed off.   Acute blood loss anemia: secondary to above. S/p 1 unit of pRBCs given. H&H are labile. Will continue to monitor   HTN urgency:  resolved  HTN: continue on losartan, bisoprolol-HCTZ. IV labetalol prn   HLD: continue on statin    DVT prophylaxis: SCDs Code Status: full  Family Communication:  Disposition Plan: likely d/c back home   Level of care: Progressive Cardiac  Status is: Inpatient  Remains inpatient appropriate because:IV treatments appropriate due to intensity of illness or inability to take PO and Inpatient level of care appropriate due to severity of illness  Dispo: The patient is from: Home              Anticipated d/c is to: Home              Patient currently is not medically stable to d/c.   Difficult to place patient : unclear    Consultants:  GI   Procedures:   Antimicrobials: flagyl, rocephin   Subjective:  Pt c/o blood in stool, small amount    Objective: Vitals:   05/25/21 1954 05/25/21 2100 05/25/21 2232 05/26/21 0404  BP: 134/75 (!) 179/85 (!) 142/83 (!) 141/72  Pulse: 69 72 71 67  Resp: 18 17  20   Temp: 97.6 F (36.4 C) 98 F (36.7 C)  98.3 F (36.8 C)  TempSrc:  Oral    SpO2: 100% 100%  100%  Weight:      Height:        Intake/Output Summary (Last 24 hours) at 05/26/2021 0812 Last data filed at 05/26/2021 0300 Gross per 24 hour  Intake 673.19 ml  Output --  Net 673.19 ml   Filed Weights   05/25/21 0050  Weight: 88 kg    Examination:  General exam: Appears comfortable   Respiratory system: Clear breath sounds b/l  Cardiovascular system: S1/S2+. No rubs or clicks  Gastrointestinal system: Abd is soft, NT, obese & normal bowel sounds  Central nervous system: Alert and oriented. Moves all extremities  Psychiatry: Judgement and insight appear normal. Appropriate mood and affect     Data Reviewed: I have personally reviewed following labs and imaging studies  CBC: Recent Labs  Lab 05/19/21 1059 05/25/21 0053 05/25/21 0320 05/25/21 0902 05/25/21 1524 05/25/21 2114 05/26/21 0503  WBC 5.7 6.4 7.2  --   --   --  5.3  NEUTROABS 3.3  --   --   --   --   --   --   HGB 12.4 8.2* 7.6* 8.4* 9.6* 9.4* 8.6*  HCT 36.9 24.0* 22.7* 25.3* 28.0* 28.1* 24.8*  MCV 93.4 93.0 95.0  --   --   --  90.8  PLT 197 216 177  --   --   --  737   Basic Metabolic Panel: Recent Labs  Lab 05/19/21 1059 05/25/21 0053 05/25/21 0320 05/26/21 0503  NA 136 139 140 141  K 4.4 3.8 4.7 3.7  CL 103 107 107 108  CO2 26 25 28  29  GLUCOSE 89 131* 122* 98  BUN 17 29* 27* 13  CREATININE 0.61 0.88 0.86 0.63  CALCIUM 8.9 9.0 8.7* 8.7*   GFR: Estimated Creatinine Clearance: 70 mL/min (by C-G formula based on SCr of 0.63 mg/dL). Liver Function Tests: Recent Labs  Lab 05/19/21 1059 05/25/21 0053  AST 41 41  ALT 21 37  ALKPHOS 69 71  BILITOT 1.5* 0.8  PROT 7.4 6.8  ALBUMIN 3.9 3.6   Recent Labs  Lab 05/19/21 1059 05/25/21 0053  LIPASE 45 38   No results for input(s): AMMONIA in the last 168 hours. Coagulation Profile: Recent Labs  Lab 05/25/21 0219  INR 1.0   Cardiac Enzymes: No results for input(s): CKTOTAL, CKMB, CKMBINDEX, TROPONINI in the last 168 hours. BNP (last 3 results) No results for input(s): PROBNP in the last 8760 hours. HbA1C: No results for input(s): HGBA1C in the last 72 hours. CBG: No results for input(s): GLUCAP in the last 168 hours. Lipid Profile: No results for input(s): CHOL, HDL, LDLCALC, TRIG, CHOLHDL, LDLDIRECT in the last 72  hours. Thyroid Function Tests: No results for input(s): TSH, T4TOTAL, FREET4, T3FREE, THYROIDAB in the last 72 hours. Anemia Panel: No results for input(s): VITAMINB12, FOLATE, FERRITIN, TIBC, IRON, RETICCTPCT in the last 72 hours. Sepsis Labs: No results for input(s): PROCALCITON, LATICACIDVEN in the last 168 hours.  Recent Results (from the past 240 hour(s))  Resp Panel by RT-PCR (Flu A&B, Covid) Nasopharyngeal Swab     Status: None   Collection Time: 05/25/21  2:19 AM   Specimen: Nasopharyngeal Swab; Nasopharyngeal(NP) swabs in vial transport medium  Result Value Ref Range Status   SARS Coronavirus 2 by RT PCR NEGATIVE NEGATIVE Final    Comment: (NOTE) SARS-CoV-2 target nucleic acids are NOT DETECTED.  The SARS-CoV-2 RNA is generally detectable in upper respiratory specimens during the acute phase of infection. The lowest concentration of SARS-CoV-2 viral copies this assay can detect is 138 copies/mL. A negative result does not preclude SARS-Cov-2 infection and should not be used as the sole basis for treatment or other patient management decisions. A negative result may occur with  improper specimen collection/handling, submission of specimen other than nasopharyngeal swab, presence of viral mutation(s) within the areas targeted by this assay, and inadequate number of viral copies(<138 copies/mL). A negative result must be combined with clinical observations, patient history, and epidemiological information. The expected result is Negative.  Fact Sheet for Patients:  EntrepreneurPulse.com.au  Fact Sheet for Healthcare Providers:  IncredibleEmployment.be  This test is no t yet approved or cleared by the [place] FDA and  has been authorized for detection and/or diagnosis of SARS-CoV-2 by FDA under an Emergency Use Authorization (EUA). This EUA will remain  in effect (meaning this test can be used) for the duration of the COVID-19  declaration under Section 564(b)(1) of the Act, 21 U.S.C.section 360bbb-3(b)(1), unless the authorization is terminated  or revoked sooner.       Influenza A by PCR NEGATIVE NEGATIVE Final   Influenza B by PCR NEGATIVE NEGATIVE Final    Comment: (NOTE) The Xpert Xpress SARS-CoV-2/FLU/RSV plus assay is intended as an aid in the diagnosis of influenza from Nasopharyngeal swab specimens and should not be used as a sole basis for treatment. Nasal washings and aspirates are unacceptable for Xpert Xpress SARS-CoV-2/FLU/RSV testing.  Fact Sheet for Patients: EntrepreneurPulse.com.au  Fact Sheet for Healthcare Providers: IncredibleEmployment.be  This test is not yet approved or cleared by the [place] and has been authorized for  detection and/or diagnosis of SARS-CoV-2 by FDA under an Emergency Use Authorization (EUA). This EUA will remain in effect (meaning this test can be used) for the duration of the COVID-19 declaration under Section 564(b)(1) of the Act, 21 U.S.C. section 360bbb-3(b)(1), unless the authorization is terminated or revoked.  Performed at [facility], [address], [place], [place] [address]          Radiology Studies: No results found.      Scheduled Meds:  bisoprolol-hydrochlorothiazide  1 tablet Oral Daily   calcium-vitamin D  1 tablet Oral Daily   folic acid  1 mg Oral Daily   loratadine  10 mg Oral Daily   losartan  50 mg Oral Daily   [START ON 05/27/2021] methotrexate  10 mg Oral Weekly   multivitamin with minerals  1 tablet Oral Daily   pravastatin  40 mg Oral q1800   Continuous Infusions:  sodium chloride     cefTRIAXone (ROCEPHIN)  IV 2 g (05/25/21 1710)   metronidazole 500 mg (05/26/21 0620)     LOS: 1 day    Time spent: 30 mins    Wyvonnia Dusky, MD [facility] Pager [phone]  If 7PM-7AM, please contact night-coverage 05/26/2021, 8:12 AM

## 2021-05-26 NOTE — TOC Initial Note (Signed)
Transition of Care [facility]) - Initial/Assessment Note    Patient Details  Name: Holly Green MRN: [id number] Date of Birth: [birth year]  Transition of Care [facility]) CM/SW Contact:    Alberteen Sam, LCSW Phone Number: 05/26/2021, 12:19 PM  Clinical Narrative:                  CSW completed readmission risk assessment, as patient was a recent readmission.   Patient reports she currently works part time at [employer], states her nephew lives  with her and she continues to drive. Patient reports being established with a PCP, no financial issues getting medications and no transportation barriers getting to and from appointments.   Patient reports no equipment needs at home, however is agreeable for [facility] RN for symptom management due to increased hospital stays recently.   CSW has reached out to [place] with [place] for [facility] set up, pending acceptance at this time.   No other discharge needs identified.   Expected Discharge Plan: [place] Barriers to Discharge: Continued Medical Work up   Patient Goals and CMS Choice Patient states their goals for this hospitalization and ongoing recovery are:: to go home CMS Medicare.gov Compare Post Acute Care list provided to:: Patient Choice offered to / list presented to : Patient  Expected Discharge Plan and Services Expected Discharge Plan: [facility]       Living arrangements for the past 2 months: Single Family Home                           HH Arranged: RN [facility] Agency: [place] ([place]) Date HH Agency Contacted: 05/26/21   Representative spoke with at [place]: Corene Cornea  Prior Living Arrangements/Services Living arrangements for the past 2 months: [place] Lives with:: Relatives   Do you feel safe going back to the place where you live?: Yes               Activities of Daily Living Home Assistive Devices/Equipment: None ADL Screening (condition at time of  admission) Patient's cognitive ability adequate to safely complete daily activities?: Yes Is the patient deaf or have difficulty hearing?: No Does the patient have difficulty seeing, even when wearing glasses/contacts?: No Does the patient have difficulty concentrating, remembering, or making decisions?: No Patient able to express need for assistance with ADLs?: Yes Does the patient have difficulty dressing or bathing?: No Independently performs ADLs?: Yes (appropriate for developmental age) Does the patient have difficulty walking or climbing stairs?: No Weakness of Legs: None Weakness of Arms/Hands: None  Permission Sought/Granted                  Emotional Assessment       Orientation: : Oriented to Self, Oriented to Place, Oriented to  Time, Oriented to Situation Alcohol / Substance Use: Not Applicable Psych Involvement: No (comment)  Admission diagnosis:  GI bleeding [K92.2] Acute GI bleeding [K92.2] Symptomatic anemia [D64.9] Patient Active Problem List   Diagnosis Date Noted   GI bleeding 05/25/2021   Acute cystitis with hematuria 09/29/2020   Chronic cough 08/17/2020   Rheumatoid arthritis of multiple sites with negative rheumatoid factor ([place]) 06/09/2020   Special screening for malignant neoplasms, colon    Abnormal CT scan, gastrointestinal tract    Gastrointestinal hemorrhage 12/29/2019   Left hip pain 10/15/2019   Obesity (BMI 30.0-34.9) 08/10/2018   Emphysema of lung ([place])  08/10/2018   Allergic rhinitis 08/10/2018   COAD (chronic obstructive airways disease) ([place]) 08/03/2018   Primary osteoarthritis of right knee 07/17/2018   Chondrocalcinosis 07/17/2018   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, of knee, 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   Anemia 12/17/2017   Diverticulosis of large intestine without diverticulitis    History of GI bleed 11/06/2017   Left flank pain, chronic 03/07/2017   Spondylolisthesis at L4-L5 level 03/07/2017   Abdominal aortic  atherosclerosis ([place]) 03/05/2017   Simple cyst of kidney 03/05/2017   Benign liver cyst 03/05/2017   Medication monitoring encounter 11/22/2016   GERD (gastroesophageal reflux disease) 08/07/2016   Osteoporosis 05/07/2016   Cardiac murmur 05/07/2016   Prediabetes 10/15/2015   Hyperlipidemia 10/15/2015   Essential hypertension 10/15/2015   Diverticulosis of colon, acquired 10/15/2015   PCP:  Delsa Grana, PA-C Pharmacy:   [facility] ([facility] [place]) [place] [address] Phone: [phone] Fax: [phone]     Social Determinants of Health (SDOH) Interventions    Readmission Risk Interventions No flowsheet data found.

## 2021-05-27 LAB — CBC
HCT: 26.1 % — ABNORMAL LOW (ref 36.0–46.0)
Hemoglobin: 8.9 g/dL — ABNORMAL LOW (ref 12.0–15.0)
MCH: 32.4 pg (ref 26.0–34.0)
MCHC: 34.1 g/dL (ref 30.0–36.0)
MCV: 94.9 fL (ref 80.0–100.0)
Platelets: 188 10*3/uL (ref 150–400)
RBC: 2.75 MIL/uL — ABNORMAL LOW (ref 3.87–5.11)
RDW: 14.6 % (ref 11.5–15.5)
WBC: 5.7 10*3/uL (ref 4.0–10.5)
nRBC: 0 % (ref 0.0–0.2)

## 2021-05-27 LAB — BASIC METABOLIC PANEL
Anion gap: 7 (ref 5–15)
BUN: 11 mg/dL (ref 8–23)
CO2: 29 mmol/L (ref 22–32)
Calcium: 9 mg/dL (ref 8.9–10.3)
Chloride: 106 mmol/L (ref 98–111)
Creatinine, Ser: 0.63 mg/dL (ref 0.44–1.00)
GFR, Estimated: >60 mL/min (ref >60)
Glucose, Bld: 92 mg/dL (ref 70–99)
Potassium: 3.6 mmol/L (ref 3.5–5.1)
Sodium: 142 mmol/L (ref 135–145)

## 2021-05-27 MED ORDER — AMOXICILLIN-POT CLAVULANATE 875-125 MG PO TABS
1.0000 | ORAL_TABLET | Freq: Two times a day (BID) | ORAL | Status: DC
  Administered 2021-05-28: 1 via ORAL
  Filled 2021-05-27: qty 1

## 2021-05-27 NOTE — Progress Notes (Addendum)
PROGRESS NOTE    Holly Green  BSW:[id number] DOB: 1946/03/30 DOA: 05/25/2021 PCP: Delsa Grana, PA-C    Assessment & Plan:   Active Problems:   GI bleeding   Acute diverticulitis: w/ recurrent bright red bleeding per rectum. Still w/ bloody stools today but less than day prior. S/p 1 unit of pRBCs transfused. If bleeding occurs again will get tagged RBC scan and possible consult vascular surg. Continue on IV flagyl and rocephin. GI eval pt and signed off.   Acute blood loss anemia: secondary to above. H&H are labile. S/p 1 unit of pRBCs transfused. Will continue to monitor   HTN urgency:  resolved  HTN: continue on bisoprolol-HCTZ, losartan. IV labetalol prn    HLD: continue on statin   Obesity: BMI 31.3. Would benefit from weight loss    DVT prophylaxis: SCDs (pt has diverticular bleed)  Code Status: full  Family Communication:  Disposition Plan: likely d/c back home   Level of care: Progressive Cardiac  Status is: Inpatient  Remains inpatient appropriate because:IV treatments appropriate due to intensity of illness or inability to take PO and Inpatient level of care appropriate due to severity of illness, still w/ bloody stools   Dispo: The patient is from: Home              Anticipated d/c is to: Home              Patient currently is not medically stable to d/c.   Difficult to place patient : unclear    Consultants:  GI   Procedures:   Antimicrobials: flagyl, rocephin   Subjective:  Pt c/o bloody stools   Objective: Vitals:   05/27/21 0300 05/27/21 0400 05/27/21 0547 05/27/21 0728  BP:   (!) 146/77 (!) 160/84  Pulse:   62 68  Resp: 14 13 16 17   Temp:   98.4 F (36.9 C) 98.2 F (36.8 C)  TempSrc:   Oral   SpO2:   100% 100%  Weight:      Height:        Intake/Output Summary (Last 24 hours) at 05/27/2021 0759 Last data filed at 05/26/2021 1939 Gross per 24 hour  Intake 1500 ml  Output --  Net 1500 ml   Filed Weights   05/25/21 0050  Weight:  88 kg    Examination:  General exam: Appears calm & comfortable  Respiratory system: Clear breath sounds b/l. No rales, wheezes Cardiovascular system: S1 & S2+. No clicks or rubs   Gastrointestinal system: Abd is soft, obese & hyperactive bowel sounds  Central nervous system: Alert and oriented. Moves all extremities  Psychiatry: Judgement and insight appear normal. Appropriate mood and affect    Data Reviewed: I have personally reviewed following labs and imaging studies  CBC: Recent Labs  Lab 05/25/21 0053 05/25/21 0320 05/25/21 0902 05/25/21 1524 05/25/21 2114 05/26/21 0503 05/26/21 1553 05/27/21 0618  WBC 6.4 7.2  --   --   --  5.3  --  5.7  HGB 8.2* 7.6*   < > 9.6* 9.4* 8.6* 9.1* 8.9*  HCT 24.0* 22.7*   < > 28.0* 28.1* 24.8* 26.9* 26.1*  MCV 93.0 95.0  --   --   --  90.8  --  94.9  PLT 216 177  --   --   --  167  --  188   < > = values in this interval not displayed.   Basic Metabolic Panel: Recent Labs  Lab 05/25/21 0053 05/25/21  0320 05/26/21 0503 05/27/21 0618  NA 139 140 141 142  K 3.8 4.7 3.7 3.6  CL 107 107 108 106  CO2 25 28 29 29   GLUCOSE 131* 122* 98 92  BUN 29* 27* 13 11  CREATININE 0.88 0.86 0.63 0.63  CALCIUM 9.0 8.7* 8.7* 9.0   GFR: Estimated Creatinine Clearance: 70 mL/min (by C-G formula based on SCr of 0.63 mg/dL). Liver Function Tests: Recent Labs  Lab 05/25/21 0053  AST 41  ALT 37  ALKPHOS 71  BILITOT 0.8  PROT 6.8  ALBUMIN 3.6   Recent Labs  Lab 05/25/21 0053  LIPASE 38   No results for input(s): AMMONIA in the last 168 hours. Coagulation Profile: Recent Labs  Lab 05/25/21 0219  INR 1.0   Cardiac Enzymes: No results for input(s): CKTOTAL, CKMB, CKMBINDEX, TROPONINI in the last 168 hours. BNP (last 3 results) No results for input(s): PROBNP in the last 8760 hours. HbA1C: No results for input(s): HGBA1C in the last 72 hours. CBG: No results for input(s): GLUCAP in the last 168 hours. Lipid Profile: No results  for input(s): CHOL, HDL, LDLCALC, TRIG, CHOLHDL, LDLDIRECT in the last 72 hours. Thyroid Function Tests: No results for input(s): TSH, T4TOTAL, FREET4, T3FREE, THYROIDAB in the last 72 hours. Anemia Panel: No results for input(s): VITAMINB12, FOLATE, FERRITIN, TIBC, IRON, RETICCTPCT in the last 72 hours. Sepsis Labs: No results for input(s): PROCALCITON, LATICACIDVEN in the last 168 hours.  Recent Results (from the past 240 hour(s))  Resp Panel by RT-PCR (Flu A&B, Covid) Nasopharyngeal Swab     Status: None   Collection Time: 05/25/21  2:19 AM   Specimen: Nasopharyngeal Swab; Nasopharyngeal(NP) swabs in vial transport medium  Result Value Ref Range Status   SARS Coronavirus 2 by RT PCR NEGATIVE NEGATIVE Final    Comment: (NOTE) SARS-CoV-2 target nucleic acids are NOT DETECTED.  The SARS-CoV-2 RNA is generally detectable in upper respiratory specimens during the acute phase of infection. The lowest concentration of SARS-CoV-2 viral copies this assay can detect is 138 copies/mL. A negative result does not preclude SARS-Cov-2 infection and should not be used as the sole basis for treatment or other patient management decisions. A negative result may occur with  improper specimen collection/handling, submission of specimen other than nasopharyngeal swab, presence of viral mutation(s) within the areas targeted by this assay, and inadequate number of viral copies(<138 copies/mL). A negative result must be combined with clinical observations, patient history, and epidemiological information. The expected result is Negative.  Fact Sheet for Patients:  EntrepreneurPulse.com.au  Fact Sheet for Healthcare Providers:  IncredibleEmployment.be  This test is no t yet approved or cleared by the [place] FDA and  has been authorized for detection and/or diagnosis of SARS-CoV-2 by FDA under an Emergency Use Authorization (EUA). This EUA will remain  in  effect (meaning this test can be used) for the duration of the COVID-19 declaration under Section 564(b)(1) of the Act, 21 U.S.C.section 360bbb-3(b)(1), unless the authorization is terminated  or revoked sooner.       Influenza A by PCR NEGATIVE NEGATIVE Final   Influenza B by PCR NEGATIVE NEGATIVE Final    Comment: (NOTE) The Xpert Xpress SARS-CoV-2/FLU/RSV plus assay is intended as an aid in the diagnosis of influenza from Nasopharyngeal swab specimens and should not be used as a sole basis for treatment. Nasal washings and aspirates are unacceptable for Xpert Xpress SARS-CoV-2/FLU/RSV testing.  Fact Sheet for Patients: EntrepreneurPulse.com.au  Fact Sheet for Healthcare Providers: IncredibleEmployment.be  This test is not yet approved or cleared by the [place] and has been authorized for detection and/or diagnosis of SARS-CoV-2 by FDA under an Emergency Use Authorization (EUA). This EUA will remain in effect (meaning this test can be used) for the duration of the COVID-19 declaration under Section 564(b)(1) of the Act, 21 U.S.C. section 360bbb-3(b)(1), unless the authorization is terminated or revoked.  Performed at [facility], [address] [place], [place] [address]          Radiology Studies: No results found.      Scheduled Meds:  bisoprolol-hydrochlorothiazide  1 tablet Oral Daily   calcium-vitamin D  1 tablet Oral Daily   folic acid  1 mg Oral Daily   loratadine  10 mg Oral Daily   losartan  50 mg Oral Daily   methotrexate  10 mg Oral Weekly   multivitamin with minerals  1 tablet Oral Daily   pravastatin  40 mg Oral q1800   Continuous Infusions:  sodium chloride     cefTRIAXone (ROCEPHIN)  IV Stopped (05/26/21 1751)   metronidazole 500 mg (05/27/21 0616)     LOS: 2 days    Time spent: 25 mins    Wyvonnia Dusky, MD [facility] Pager [phone]  If 7PM-7AM, please  contact night-coverage 05/27/2021, 7:59 AM

## 2021-05-28 LAB — BASIC METABOLIC PANEL
Anion gap: 7 (ref 5–15)
BUN: 18 mg/dL (ref 8–23)
CO2: 27 mmol/L (ref 22–32)
Calcium: 8.8 mg/dL — ABNORMAL LOW (ref 8.9–10.3)
Chloride: 104 mmol/L (ref 98–111)
Creatinine, Ser: 0.78 mg/dL (ref 0.44–1.00)
GFR, Estimated: >60 mL/min (ref >60)
Glucose, Bld: 92 mg/dL (ref 70–99)
Potassium: 3.7 mmol/L (ref 3.5–5.1)
Sodium: 138 mmol/L (ref 135–145)

## 2021-05-28 LAB — CBC
HCT: 26.3 % — ABNORMAL LOW (ref 36.0–46.0)
Hemoglobin: 8.7 g/dL — ABNORMAL LOW (ref 12.0–15.0)
MCH: 31.3 pg (ref 26.0–34.0)
MCHC: 33.1 g/dL (ref 30.0–36.0)
MCV: 94.6 fL (ref 80.0–100.0)
Platelets: 214 K/uL (ref 150–400)
RBC: 2.78 MIL/uL — ABNORMAL LOW (ref 3.87–5.11)
RDW: 14.6 % (ref 11.5–15.5)
WBC: 5.9 K/uL (ref 4.0–10.5)
nRBC: 0 % (ref 0.0–0.2)

## 2021-05-28 MED ORDER — AMOXICILLIN-POT CLAVULANATE 875-125 MG PO TABS: 1.0000 | ORAL_TABLET | Freq: Two times a day (BID) | ORAL | 0 refills | Status: DC

## 2021-05-28 MED ORDER — AMOXICILLIN-POT CLAVULANATE 875-125 MG PO TABS: 1.0000 | ORAL_TABLET | Freq: Two times a day (BID) | ORAL | 0 refills | Status: AC

## 2021-05-28 NOTE — Progress Notes (Signed)
This RN provided discharge instructions and teaching to the patient. The patient demonstrated and verbalized understanding of the provided instructions. All outstanding questions resolved. R FA PIV removed. Cannula intact. Pt tolerated well. All belongings packed and in tow. Volunteer services to transport patient to private vehicle via wheelchair at time of departure.

## 2021-05-28 NOTE — Discharge Summary (Signed)
Physician Discharge Summary  Holly Green NWG:[id number] DOB: 1946/05/28 DOA: 05/25/2021  PCP: Delsa Grana, PA-C  Admit date: 05/25/2021 Discharge date: 05/28/2021  Admitted From: home  Disposition:  home   Recommendations for Outpatient Follow-up:  Follow up with PCP in 1-2 weeks F/u w/ GI in 1-2 weeks   Home Health: no  Equipment/Devices:  Discharge Condition: stable  CODE STATUS: Full  Diet recommendation: Heart Healthy  Brief/Interim Summary: HPI was taken from Dr. Sidney Ace: Holly Green is a 75 y.o. African-American female with medical history significant for diverticulitis, GERD, peptic ulcer disease, hypertension, dyslipidemia, prediabetes and osteoarthritis, who presented to the emergency room with acute onset of recurrent bright red bleeding per rectum tonight.  She was feeling shaky per her report.  She was recently treated for acute diverticulitis and finished antibiotic therapy yesterday.  She admitted to abdominal cramps with her bowel movements.  No fever or chills.  No chest pain or dyspnea or cough or wheezing or hemoptysis.  No dysuria, oliguria or hematuria or flank pain.  No other bleeding diathesis.   ED Course: Upon presentation to the emergency room blood pressure was 144/87 with heart rate 101 and otherwise normal vital signs.  Labs revealed hemoglobin of 8.2 and hematocrit 24 compared to 12.4 and 36.9 on 05/19/2021.  BUN was 29 up from 17 then and creatinine 0.88.     Imaging: Abdominal pelvic CT scan on 6/25 revealed mild uncomplicated diverticulitis of the splenic flexure with no abscess and no other abdominal abnormalities.  It showed pancolonic diverticulosis and aortic atherosclerosis.  The patient was given IV Zosyn and hydration with IV normal saline.  The patient will be admitted to a progressive unit bed for further evaluation and management  Hospital course from Dr. Jimmye Norman 7/1-05/28/21: Pt presented w/ GI bleeding likely secondary to acute diverticular  bleed. No scopes were recommended by GI and GI signed off. Of note, pt was initially put IV abxs for acute diverticulitis as well and then switched to po augmentin to finish the course. The pt's stool still contained a minimal amount of blood at d/c. Pt understands the signs/symptoms to return to the hospital. Pt verbalized her understanding.   Discharge Diagnoses:  Active Problems:   GI bleeding  Acute diverticulitis: w/ recurrent bright red bleeding per rectum. Still w/ some blood in stool and abd discomfort today. S/p 1 unit of pRBCs transfused. If bleeding occurs again will get tagged RBC scan and possible consult vascular surg. Switched abxs to po augmentin. GI eval pt and signed off.   Acute blood loss anemia: secondary to above. H&H are labile. S/p 1 unit of pRBCs transfused. Will continue to monitor    HTN urgency:  resolved   HTN: continue on HCTZ-bisoprolol, losartan. IV labetalol prn      HLD: continue on statin    Obesity: BMI 31.3. Would benefit from weight loss   Discharge Instructions  Discharge Instructions     Diet - low sodium heart healthy   Complete by: As directed    Discharge instructions   Complete by: As directed    F/u w/ PCP in 1-2 weeks. F/u w/ GI in 1-2 weeks   Increase activity slowly   Complete by: As directed       Allergies as of 05/28/2021       Reactions   Aspirin Other (See Comments)   Diverticulosis   Lisinopril Cough        Medication List     TAKE  these medications    acetaminophen 650 MG CR tablet Commonly known as: TYLENOL Take 650-1,300 mg by mouth every 8 (eight) hours as needed for pain.   amoxicillin-clavulanate 875-125 MG tablet Commonly known as: AUGMENTIN Take 1 tablet by mouth every 12 (twelve) hours for 1 day.   bisoprolol-hydrochlorothiazide 10-6.25 MG tablet Commonly known as: ZIAC Take 1 tablet by mouth daily.   Calcium-Vitamin D 600-400 MG-UNIT Tabs Take 1 tablet by mouth daily.   Certolizumab Pegol 2 X  200 MG/ML Pskt Inject 400 mg into the skin every 28 (twenty-eight) days.   folic acid 1 MG tablet Commonly known as: FOLVITE Take 1 mg by mouth daily.   loratadine 10 MG tablet Commonly known as: CLARITIN Take 1 tablet (10 mg total) by mouth daily.   losartan 50 MG tablet Commonly known as: COZAAR Take 1 tablet (50 mg total) by mouth daily.   lovastatin 40 MG tablet Commonly known as: MEVACOR Take 1 tablet (40 mg total) by mouth at bedtime.   methotrexate 2.5 MG tablet Take 10 mg by mouth once a week. Pt takes every sunday   Multi-Vitamin tablet Take 1 tablet by mouth daily.   polyethylene glycol 17 g packet Commonly known as: MiraLax Take 17 g by mouth daily.        Allergies  Allergen Reactions   Aspirin Other (See Comments)    Diverticulosis   Lisinopril Cough    Consultations: GI   Procedures/Studies: CT ABDOMEN PELVIS W CONTRAST  Result Date: 05/19/2021 CLINICAL DATA:  Abdominal pain.  Hematochezia. EXAM: CT ABDOMEN AND PELVIS WITH CONTRAST TECHNIQUE: Multidetector CT imaging of the abdomen and pelvis was performed using the standard protocol following bolus administration of intravenous contrast. CONTRAST:  185mL OMNIPAQUE IOHEXOL 300 MG/ML  SOLN COMPARISON:  CT, 01/28/2020. FINDINGS: Lower chest: No acute abnormality. Hepatobiliary: Liver normal in size and overall attenuation. Several low-attenuation liver masses, most subcentimeter, the largest in segment 2 measuring 2.2 cm, stable and consistent with cysts. Normal gallbladder. No bile duct dilation. Pancreas: Unremarkable. No pancreatic ductal dilatation or surrounding inflammatory changes. Spleen: Normal in size without focal abnormality. Adrenals/Urinary Tract: No adrenal masses. Kidneys normal size, orientation and position with symmetric enhancement and excretion. 1 cm upper pole left renal cyst. No other masses, no stones and no hydronephrosis. Ureters normal in course and in caliber. Bladder is  unremarkable. Stomach/Bowel: Mild inflammatory change seen in the fat adjacent to the colon at the level of the splenic flexure. There are several diverticula in this location and this portion of the colon shows wall thickening. Findings consistent with mild uncomplicated diverticulitis. There is no extraluminal or free air and there is no fluid collection to suggest an abscess. There are numerous other colonic diverticula with no other evidence of diverticulitis. Small bowel and stomach are unremarkable. Normal appendix visualized. Vascular/Lymphatic: Aortic atherosclerosis. No aneurysm. No enlarged lymph nodes. Reproductive: Status post hysterectomy. No adnexal masses. Other: No abdominal wall hernia or abnormality. No abdominopelvic ascites. 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 No acute fractures. No osteoblastic or osteolytic lesions. IMPRESSION: 1. Mild uncomplicated diverticulitis of the splenic flexure of the colon. No extraluminal or free air and no evidence of an abscess. 2. No other acute abnormality within the abdomen or pelvis. 3. Pan colonic diverticulosis.  Aortic atherosclerosis. Electronically Signed   By: Lajean Manes M.D.   On: 05/19/2021 12:38   (Echo, Carotid, EGD, Colonoscopy, ERCP)    Subjective: Pt c/o "abd heaviness."   Discharge Exam: Vitals:  05/28/21 0851 05/28/21 1147  BP: (!) 154/85 (!) 143/69  Pulse: 72 69  Resp: 18 18  Temp: 98.1 F (36.7 C) 97.8 F (36.6 C)  SpO2: 100% 99%   Vitals:   05/27/21 2003 05/28/21 0512 05/28/21 0851 05/28/21 1147  BP: (!) 155/78 (!) 146/77 (!) 154/85 (!) 143/69  Pulse: 70 68 72 69  Resp:  16 18 18   Temp: 98.1 F (36.7 C) 98.2 F (36.8 C) 98.1 F (36.7 C) 97.8 F (36.6 C)  TempSrc:  Oral  Oral  SpO2: 99% 99% 100% 99%  Weight:      Height:        General: Pt is alert, awake, not in acute distress Cardiovascular:  S1/S2 +, no rubs, no gallops Respiratory: CTA bilaterally, no wheezing, no rhonchi Abdominal: Soft,  NT, ND, bowel sounds + Extremities: no edema, no cyanosis    The results of significant diagnostics from this hospitalization (including imaging, microbiology, ancillary and laboratory) are listed below for reference.     Microbiology: Recent Results (from the past 240 hour(s))  Resp Panel by RT-PCR (Flu A&B, Covid) Nasopharyngeal Swab     Status: None   Collection Time: 05/25/21  2:19 AM   Specimen: Nasopharyngeal Swab; Nasopharyngeal(NP) swabs in vial transport medium  Result Value Ref Range Status   SARS Coronavirus 2 by RT PCR NEGATIVE NEGATIVE Final    Comment: (NOTE) SARS-CoV-2 target nucleic acids are NOT DETECTED.  The SARS-CoV-2 RNA is generally detectable in upper respiratory specimens during the acute phase of infection. The lowest concentration of SARS-CoV-2 viral copies this assay can detect is 138 copies/mL. A negative result does not preclude SARS-Cov-2 infection and should not be used as the sole basis for treatment or other patient management decisions. A negative result may occur with  improper specimen collection/handling, submission of specimen other than nasopharyngeal swab, presence of viral mutation(s) within the areas targeted by this assay, and inadequate number of viral copies(<138 copies/mL). A negative result must be combined with clinical observations, patient history, and epidemiological information. The expected result is Negative.  Fact Sheet for Patients:  EntrepreneurPulse.com.au  Fact Sheet for Healthcare Providers:  IncredibleEmployment.be  This test is no t yet approved or cleared by the [place] FDA and  has been authorized for detection and/or diagnosis of SARS-CoV-2 by FDA under an Emergency Use Authorization (EUA). This EUA will remain  in effect (meaning this test can be used) for the duration of the COVID-19 declaration under Section 564(b)(1) of the Act, 21 U.S.C.section 360bbb-3(b)(1),  unless the authorization is terminated  or revoked sooner.       Influenza A by PCR NEGATIVE NEGATIVE Final   Influenza B by PCR NEGATIVE NEGATIVE Final    Comment: (NOTE) The Xpert Xpress SARS-CoV-2/FLU/RSV plus assay is intended as an aid in the diagnosis of influenza from Nasopharyngeal swab specimens and should not be used as a sole basis for treatment. Nasal washings and aspirates are unacceptable for Xpert Xpress SARS-CoV-2/FLU/RSV testing.  Fact Sheet for Patients: EntrepreneurPulse.com.au  Fact Sheet for Healthcare Providers: IncredibleEmployment.be  This test is not yet approved or cleared by the [place] FDA and has been authorized for detection and/or diagnosis of SARS-CoV-2 by FDA under an Emergency Use Authorization (EUA). This EUA will remain in effect (meaning this test can be used) for the duration of the COVID-19 declaration under Section 564(b)(1) of the Act, 21 U.S.C. section 360bbb-3(b)(1), unless the authorization is terminated or revoked.  Performed at [facility],  [place], [place] [address]      Labs: BNP (last 3 results) No results for input(s): BNP in the last 8760 hours. Basic Metabolic Panel: Recent Labs  Lab 05/25/21 0053 05/25/21 0320 05/26/21 0503 05/27/21 0618 05/28/21 0544  NA 139 140 141 142 138  K 3.8 4.7 3.7 3.6 3.7  CL 107 107 108 106 104  CO2 25 28 29 29 27   GLUCOSE 131* 122* 98 92 92  BUN 29* 27* 13 11 18   CREATININE 0.88 0.86 0.63 0.63 0.78  CALCIUM 9.0 8.7* 8.7* 9.0 8.8*   Liver Function Tests: Recent Labs  Lab 05/25/21 0053  AST 41  ALT 37  ALKPHOS 71  BILITOT 0.8  PROT 6.8  ALBUMIN 3.6   Recent Labs  Lab 05/25/21 0053  LIPASE 38   No results for input(s): AMMONIA in the last 168 hours. CBC: Recent Labs  Lab 05/25/21 0053 05/25/21 0320 05/25/21 0902 05/25/21 2114 05/26/21 0503 05/26/21 1553 05/27/21 0618 05/28/21 0544  WBC 6.4 7.2   --   --  5.3  --  5.7 5.9  HGB 8.2* 7.6*   < > 9.4* 8.6* 9.1* 8.9* 8.7*  HCT 24.0* 22.7*   < > 28.1* 24.8* 26.9* 26.1* 26.3*  MCV 93.0 95.0  --   --  90.8  --  94.9 94.6  PLT 216 177  --   --  167  --  188 214   < > = values in this interval not displayed.   Cardiac Enzymes: No results for input(s): CKTOTAL, CKMB, CKMBINDEX, TROPONINI in the last 168 hours. BNP: Invalid input(s): POCBNP CBG: No results for input(s): GLUCAP in the last 168 hours. D-Dimer No results for input(s): DDIMER in the last 72 hours. Hgb A1c No results for input(s): HGBA1C in the last 72 hours. Lipid Profile No results for input(s): CHOL, HDL, LDLCALC, TRIG, CHOLHDL, LDLDIRECT in the last 72 hours. Thyroid function studies No results for input(s): TSH, T4TOTAL, T3FREE, THYROIDAB in the last 72 hours.  Invalid input(s): FREET3 Anemia work up No results for input(s): VITAMINB12, FOLATE, FERRITIN, TIBC, IRON, RETICCTPCT in the last 72 hours. Urinalysis    Component Value Date/Time   COLORURINE YELLOW (A) 05/25/2021 0734   APPEARANCEUR CLEAR (A) 05/25/2021 0734   APPEARANCEUR Clear 04/29/2018 1249   LABSPEC 1.017 05/25/2021 0734   LABSPEC 1.020 01/11/2014 1139   PHURINE 5.0 05/25/2021 0734   GLUCOSEU NEGATIVE 05/25/2021 0734   GLUCOSEU 150 mg/dL 01/11/2014 1139   HGBUR NEGATIVE 05/25/2021 0734   BILIRUBINUR NEGATIVE 05/25/2021 0734   BILIRUBINUR Negative 09/29/2020 1144   BILIRUBINUR Negative 04/29/2018 1249   BILIRUBINUR Negative 01/11/2014 1139   KETONESUR NEGATIVE 05/25/2021 0734   PROTEINUR NEGATIVE 05/25/2021 0734   UROBILINOGEN 0.2 09/29/2020 1144   UROBILINOGEN 0.2 09/27/2009 0843   NITRITE NEGATIVE 05/25/2021 0734   LEUKOCYTESUR NEGATIVE 05/25/2021 0734   LEUKOCYTESUR Negative 01/11/2014 1139   Sepsis Labs Invalid input(s): PROCALCITONIN,  WBC,  LACTICIDVEN Microbiology Recent Results (from the past 240 hour(s))  Resp Panel by RT-PCR (Flu A&B, Covid) Nasopharyngeal Swab     Status: None    Collection Time: 05/25/21  2:19 AM   Specimen: Nasopharyngeal Swab; Nasopharyngeal(NP) swabs in vial transport medium  Result Value Ref Range Status   SARS Coronavirus 2 by RT PCR NEGATIVE NEGATIVE Final    Comment: (NOTE) SARS-CoV-2 target nucleic acids are NOT DETECTED.  The SARS-CoV-2 RNA is generally detectable in upper respiratory specimens during the acute phase of infection.  The lowest concentration of SARS-CoV-2 viral copies this assay can detect is 138 copies/mL. A negative result does not preclude SARS-Cov-2 infection and should not be used as the sole basis for treatment or other patient management decisions. A negative result may occur with  improper specimen collection/handling, submission of specimen other than nasopharyngeal swab, presence of viral mutation(s) within the areas targeted by this assay, and inadequate number of viral copies(<138 copies/mL). A negative result must be combined with clinical observations, patient history, and epidemiological information. The expected result is Negative.  Fact Sheet for Patients:  EntrepreneurPulse.com.au  Fact Sheet for Healthcare Providers:  IncredibleEmployment.be  This test is no t yet approved or cleared by the [place] FDA and  has been authorized for detection and/or diagnosis of SARS-CoV-2 by FDA under an Emergency Use Authorization (EUA). This EUA will remain  in effect (meaning this test can be used) for the duration of the COVID-19 declaration under Section 564(b)(1) of the Act, 21 U.S.C.section 360bbb-3(b)(1), unless the authorization is terminated  or revoked sooner.       Influenza A by PCR NEGATIVE NEGATIVE Final   Influenza B by PCR NEGATIVE NEGATIVE Final    Comment: (NOTE) The Xpert Xpress SARS-CoV-2/FLU/RSV plus assay is intended as an aid in the diagnosis of influenza from Nasopharyngeal swab specimens and should not be used as a sole basis for treatment.  Nasal washings and aspirates are unacceptable for Xpert Xpress SARS-CoV-2/FLU/RSV testing.  Fact Sheet for Patients: EntrepreneurPulse.com.au  Fact Sheet for Healthcare Providers: IncredibleEmployment.be  This test is not yet approved or cleared by the [place] FDA and has been authorized for detection and/or diagnosis of SARS-CoV-2 by FDA under an Emergency Use Authorization (EUA). This EUA will remain in effect (meaning this test can be used) for the duration of the COVID-19 declaration under Section 564(b)(1) of the Act, 21 U.S.C. section 360bbb-3(b)(1), unless the authorization is terminated or revoked.  Performed at [facility], [address] [place], [place] [address]      Time coordinating discharge: Over 30 minutes  SIGNED:   Wyvonnia Dusky, MD  [facility] 05/28/2021, 2:26 PM Pager   If 7PM-7AM, please contact night-coverage

## 2021-05-28 NOTE — Care Management Important Message (Signed)
Important Message  Patient Details  Name: Holly Green MRN: [id number] Date of Birth: 23-Jun-1946   Medicare Important Message Given:  Yes     Dannette Barbara 05/28/2021, 1:19 PM

## 2021-05-28 NOTE — Progress Notes (Deleted)
Na

## 2021-05-28 NOTE — Plan of Care (Signed)
  Problem: Education: Goal: Knowledge of General Education information will improve Description: Including pain rating scale, medication(s)/side effects and non-pharmacologic comfort measures 05/28/2021 1022 by Cristela Blue, RN Outcome: Progressing 05/28/2021 1022 by Cristela Blue, RN Outcome: Progressing   Problem: Health Behavior/Discharge Planning: Goal: Ability to manage health-related needs will improve 05/28/2021 1022 by Cristela Blue, RN Outcome: Progressing 05/28/2021 1022 by Cristela Blue, RN Outcome: Progressing   Problem: Clinical Measurements: Goal: Ability to maintain clinical measurements within normal limits will improve 05/28/2021 1022 by Cristela Blue, RN Outcome: Progressing 05/28/2021 1022 by Cristela Blue, RN Outcome: Progressing Goal: Will remain free from infection 05/28/2021 1022 by Cristela Blue, RN Outcome: Progressing 05/28/2021 1022 by Cristela Blue, RN Outcome: Progressing Goal: Diagnostic test results will improve 05/28/2021 1022 by Cristela Blue, RN Outcome: Progressing 05/28/2021 1022 by Cristela Blue, RN Outcome: Progressing Goal: Respiratory complications will improve 05/28/2021 1022 by Cristela Blue, RN Outcome: Progressing 05/28/2021 1022 by Cristela Blue, RN Outcome: Progressing Goal: Cardiovascular complication will be avoided 05/28/2021 1022 by Cristela Blue, RN Outcome: Progressing 05/28/2021 1022 by Cristela Blue, RN Outcome: Progressing   Problem: Activity: Goal: Risk for activity intolerance will decrease 05/28/2021 1022 by Cristela Blue, RN Outcome: Progressing 05/28/2021 1022 by Cristela Blue, RN Outcome: Progressing   Problem: Nutrition: Goal: Adequate nutrition will be maintained 05/28/2021 1022 by Cristela Blue, RN Outcome: Progressing 05/28/2021 1022 by Cristela Blue, RN Outcome: Progressing   Problem: Coping: Goal: Level of anxiety will decrease 05/28/2021 1022 by Cristela Blue, RN Outcome: Progressing 05/28/2021 1022 by  Cristela Blue, RN Outcome: Progressing   Problem: Elimination: Goal: Will not experience complications related to bowel motility 05/28/2021 1022 by Cristela Blue, RN Outcome: Progressing 05/28/2021 1022 by Cristela Blue, RN Outcome: Progressing Goal: Will not experience complications related to urinary retention 05/28/2021 1022 by Cristela Blue, RN Outcome: Progressing 05/28/2021 1022 by Cristela Blue, RN Outcome: Progressing   Problem: Pain Managment: Goal: General experience of comfort will improve 05/28/2021 1022 by Cristela Blue, RN Outcome: Progressing 05/28/2021 1022 by Cristela Blue, RN Outcome: Progressing   Problem: Safety: Goal: Ability to remain free from injury will improve 05/28/2021 1022 by Cristela Blue, RN Outcome: Progressing 05/28/2021 1022 by Cristela Blue, RN Outcome: Progressing   Problem: Skin Integrity: Goal: Risk for impaired skin integrity will decrease 05/28/2021 1022 by Cristela Blue, RN Outcome: Progressing 05/28/2021 1022 by Cristela Blue, RN Outcome: Progressing   Problem: Education: Goal: Ability to identify signs and symptoms of gastrointestinal bleeding will improve 05/28/2021 1022 by Cristela Blue, RN Outcome: Progressing 05/28/2021 1022 by Cristela Blue, RN Outcome: Progressing   Problem: Bowel/Gastric: Goal: Will show no signs and symptoms of gastrointestinal bleeding 05/28/2021 1022 by Cristela Blue, RN Outcome: Progressing 05/28/2021 1022 by Cristela Blue, RN Outcome: Progressing   Problem: Fluid Volume: Goal: Will show no signs and symptoms of excessive bleeding 05/28/2021 1022 by Cristela Blue, RN Outcome: Progressing 05/28/2021 1022 by Cristela Blue, RN Outcome: Progressing   Problem: Clinical Measurements: Goal: Complications related to the disease process, condition or treatment will be avoided or minimized 05/28/2021 1022 by Cristela Blue, RN Outcome: Progressing 05/28/2021 1022 by Cristela Blue, RN Outcome: Progressing

## 2021-05-30 ENCOUNTER — Telehealth: Payer: Self-pay

## 2021-05-30 NOTE — Telephone Encounter (Signed)
Transition Care Management Unsuccessful Follow-up Telephone Call  Date of discharge and from where:  05/28/21 [facility]  Attempts:  1st Attempt  Reason for unsuccessful TCM follow-up call:  Left voice message. Pt may reach me directly at [phone]

## 2021-05-30 NOTE — Telephone Encounter (Signed)
Transition Care Management Follow-up Telephone Call Date of discharge and from where: 05/28/21 [facility] How have you been since you were released from the hospital? Pt states she is still feeling weak but doing okay; no blood in stool Any questions or concerns? No  Items Reviewed: Did the pt receive and understand the discharge instructions provided? Yes  Medications obtained and verified? Yes  Other? No  Any new allergies since your discharge? No  Dietary orders reviewed? Yes Do you have support at home? Yes   Home Care and Equipment/Supplies: Were home health services ordered? yes If so, what is the name of the agency? [facility] Has the agency set up a time to come to the patient's home? yes Were any new equipment or medical supplies ordered?  No  Functional Questionnaire: (I = Independent and D = Dependent) ADLs: I  Bathing/Dressing- I  Meal Prep- I  Eating- I  Maintaining continence- I  Transferring/Ambulation- I  Managing Meds- I  Follow up appointments reviewed:  PCP Hospital f/u appt confirmed? No  Awaiting PCP approval to update appt. [facility] f/u appt confirmed? Yes  Scheduled to see Dr. Bonna Gains on 06/27/21. Are transportation arrangements needed? No  If their condition worsens, is the pt aware to call PCP or go to the Emergency Dept.? Yes Was the patient provided with contact information for the PCP's office or ED? Yes Was to pt encouraged to call back with questions or concerns? Yes

## 2021-05-31 ENCOUNTER — Telehealth: Payer: Self-pay

## 2021-05-31 NOTE — Telephone Encounter (Signed)
Copied from [place] [id number]. Topic: General - Other >> May 31, 2021 11:12 AM Tessa Lerner A wrote: Reason for CRM: Tiffany with [facility] has called to share that an evaluation of the patient for home health services was performed today 05/31/21   The patient shared that they plan on returning to work Monday 06/04/21  If patient returns to work they will be unable to receive services from [facility]   Please contact to further discuss

## 2021-06-05 DIAGNOSIS — M0609 Rheumatoid arthritis without rheumatoid factor, multiple sites: Secondary | ICD-10-CM | POA: Diagnosis not present

## 2021-06-27 ENCOUNTER — Other Ambulatory Visit: Payer: Self-pay

## 2021-06-27 ENCOUNTER — Ambulatory Visit (INDEPENDENT_AMBULATORY_CARE_PROVIDER_SITE_OTHER): Payer: Medicare Other | Admitting: Gastroenterology

## 2021-06-27 VITALS — BP 145/80 | HR 71 | Temp 98.3°F | Wt 190.4 lb

## 2021-06-27 DIAGNOSIS — K5732 Diverticulitis of large intestine without perforation or abscess without bleeding: Secondary | ICD-10-CM

## 2021-06-27 DIAGNOSIS — D649 Anemia, unspecified: Secondary | ICD-10-CM | POA: Diagnosis not present

## 2021-06-27 NOTE — Patient Instructions (Signed)
Referral has been placed to [facility], they will call you schedule an appointment

## 2021-06-27 NOTE — Progress Notes (Signed)
Holly Antigua, MD [address]  [place]  [place], [place] [address]  Main: [phone]  Fax: [phone]   Primary Care Physician: Delsa Grana, PA-C   Chief Complaint  Patient presents with   Hospitalization Follow-up    Pt reports she is doing well, no new complaints or concerns   Blood per rectum  HPI: Holly Green is a 75 y.o. female here for posthospitalization follow-up for rectal bleeding and diverticulitis.  Patient denies any further episodes of blood per rectum since hospital discharge.  Shows me pictures on her phone that shows blood in the toilet bowl prior to her hospital admission.  She states the abdominal pain that she went into the hospital with has resolved as well.  Denies any nausea or vomiting.  Reports good appetite.  No melena.  Previous history: colonoscopy in May 2021 showed diverticulosis and was otherwise normal.  Hemoglobin improved and normal at this time.  She underwent EGD in March 2021 due to a CT showing gastric thickening.  Upper endoscopy was normal except for hiatal hernia  Patient has a history of diverticular bleeding, last hospitalized for it in February 2021.  2018 procedures during hospitalization: "She underwent an EGD that was normal on that admission, and a colonoscopy that showed diffuse diverticulosis, and the cecum, ascending colon, descending colon and sigmoid colon.  Majority of the diverticulosis was in the left colon.  None of these were actively bleeding and no intervention was needed during the colonoscopy.    Repeat recommended in 5 years.   She has history of diverticular bleeds in the past.  In 2015 she had similar symptoms and and colonoscopy was planned but due to significant bleeding, RBC scan was done and was positive for hemorrhage in the right colon likely related to bleeding diverticulum.  Patient underwent coil embolization at that time.  Prior to this in 2009 she also had a positive RBC scan in the mid  transverse and descending colon consistent with GI bleed.   In 2012 patient had an upper endoscopy which showed hiatal hernia, erosive gastropathy, colonoscopy was done for a lot of abdominal pain at the time and showed solitary ulcer in the descending colon."   ROS: All ROS reviewed and negative except as per HPI   Past Medical History:  Diagnosis Date   Abdominal aortic atherosclerosis ([place]) 03/05/2017   CT scan April 2018   Arthritis    Bleeding disorder ([place])    Diverticulitis 2015   GERD (gastroesophageal reflux disease)    History of kidney stones    History of stomach ulcers    Hyperlipidemia    Hypertension    Osteoporosis    Pre-diabetes    Prediabetes 10/15/2015   Spondylolisthesis at L4-L5 level 03/07/2017    Past Surgical History:  Procedure Laterality Date   ABDOMINAL HYSTERECTOMY     BACK SURGERY  2010   BREAST CYST ASPIRATION Right 1990's   COLONOSCOPY Left 11/09/2017   Procedure: COLONOSCOPY;  Surgeon: Virgel Manifold, MD;  Location: [facility] ENDOSCOPY;  Service: Endoscopy;  Laterality: Left;   COLONOSCOPY WITH PROPOFOL N/A 04/14/2020   Procedure: COLONOSCOPY WITH PROPOFOL;  Surgeon: Virgel Manifold, MD;  Location: [facility] ENDOSCOPY;  Service: Endoscopy;  Laterality: N/A;   CYSTO WITH HYDRODISTENSION N/A 08/15/2017   Procedure: CYSTOSCOPY/HYDRODISTENSION;  Surgeon: Nickie Retort, MD;  Location: [facility] ORS;  Service: Urology;  Laterality: N/A;   ESOPHAGOGASTRODUODENOSCOPY Left 11/09/2017   Procedure: ESOPHAGOGASTRODUODENOSCOPY (EGD);  Surgeon: Virgel Manifold,  MD;  Location: [facility] ENDOSCOPY;  Service: Endoscopy;  Laterality: Left;   ESOPHAGOGASTRODUODENOSCOPY (EGD) WITH PROPOFOL N/A 02/22/2020   Procedure: ESOPHAGOGASTRODUODENOSCOPY (EGD) WITH PROPOFOL;  Surgeon: Lucilla Lame, MD;  Location: [facility] ENDOSCOPY;  Service: Endoscopy;  Laterality: N/A;   FOOT SURGERY Right     Prior to Admission medications   Medication Sig Start Date End Date Taking?  Authorizing Provider  acetaminophen (TYLENOL) 650 MG CR tablet Take 650-1,300 mg by mouth every 8 (eight) hours as needed for pain.   Yes [provider]  bisoprolol-hydrochlorothiazide (ZIAC) 10-6.25 MG tablet Take 1 tablet by mouth daily. 06/14/20  Yes Delsa Grana, PA-C  Calcium Carb-Cholecalciferol (CALCIUM-VITAMIN D) 600-400 MG-UNIT TABS Take 1 tablet by mouth daily.   Yes [provider]  Certolizumab Pegol 2 X 200 MG/ML PSKT Inject 400 mg into the skin every 28 (twenty-eight) days. 05/01/21  Yes [provider]  folic acid (FOLVITE) 1 MG tablet Take 1 mg by mouth daily. 08/11/20 08/11/21 Yes [provider]  loratadine (CLARITIN) 10 MG tablet Take 1 tablet (10 mg total) by mouth daily. 10/26/19  Yes Hubbard Hartshorn, FNP  losartan (COZAAR) 50 MG tablet Take 1 tablet (50 mg total) by mouth daily. 06/14/20  Yes Delsa Grana, PA-C  lovastatin (MEVACOR) 40 MG tablet Take 1 tablet (40 mg total) by mouth at bedtime. 06/14/20  Yes Delsa Grana, PA-C  methotrexate 2.5 MG tablet Take 10 mg by mouth once a week. Pt takes every sunday 09/07/20  Yes [provider]  Multiple Vitamin (MULTI-VITAMIN) tablet Take 1 tablet by mouth daily.   Yes [provider]  polyethylene glycol (MIRALAX) 17 g packet Take 17 g by mouth daily. 01/18/20  Yes Virgel Manifold, MD    Family History  Problem Relation Age of Onset   Hypertension Brother    Heart disease Brother    Heart failure Brother    Hypertension Mother    Stroke Mother    Stroke Father    Prostate cancer Father    Aneurysm Sister    Breast cancer Sister 31   Liver disease Maternal Aunt    Heart failure Paternal Aunt    Heart disease Paternal Uncle    Heart attack Brother    Heart disease Brother    Breast cancer Maternal Aunt    Heart disease Paternal Aunt    Kidney cancer Neg Hx    Bladder Cancer Neg Hx      Social History   Tobacco Use   Smoking status: Never   Smokeless tobacco: Never   Vaping Use   Vaping Use: Never used  Substance Use Topics   Alcohol use: No    Alcohol/week: 0.0 standard drinks   Drug use: No    Allergies as of 06/27/2021 - Review Complete 06/27/2021  Allergen Reaction Noted   Aspirin Other (See Comments) 08/08/2017   Lisinopril Cough 06/14/2019    Physical Examination:  Constitutional: General:   Alert,  Well-developed, well-nourished, pleasant and cooperative in NAD BP (!) 145/80   Pulse 71   Temp 98.3 F (36.8 C) (Oral)   Wt 190 lb 6.4 oz (86.4 kg)   LMP  (LMP Unknown)   BMI 30.73 kg/m   Respiratory: Normal respiratory effort  Gastrointestinal:  Soft, non-tender and non-distended without masses, hepatosplenomegaly or hernias noted.  No guarding or rebound tenderness.     Cardiac: No clubbing or edema.  No cyanosis. Normal posterior tibial pedal pulses noted.  Psych:  Alert and cooperative.  Normal mood and affect.  Musculoskeletal:  Normal gait. Head normocephalic, atraumatic. Symmetrical without gross deformities. 5/5 Lower extremity strength bilaterally.  Skin: Warm. Intact without significant lesions or rashes. No jaundice.  Neck: Supple, trachea midline  Lymph: No cervical lymphadenopathy  Psych:  Alert and oriented x3, Alert and cooperative. Normal mood and affect.  Labs: CMP     Component Value Date/Time   NA 138 05/28/2021 0544   NA 141 10/15/2019 1205   NA 141 01/16/2014 0514   K 3.7 05/28/2021 0544   K 3.5 01/16/2014 0514   CL 104 05/28/2021 0544   CL 108 (H) 01/16/2014 0514   CO2 27 05/28/2021 0544   CO2 28 01/16/2014 0514   GLUCOSE 92 05/28/2021 0544   GLUCOSE 104 (H) 01/16/2014 0514   BUN 18 05/28/2021 0544   BUN 15 10/15/2019 1205   BUN 2 (L) 01/16/2014 0514   CREATININE 0.78 05/28/2021 0544   CREATININE 0.73 12/29/2020 0000   CALCIUM 8.8 (L) 05/28/2021 0544   CALCIUM 7.6 (L) 01/16/2014 0514   PROT 6.8 05/25/2021 0053   PROT 6.7 10/15/2019 1205   PROT 6.2 (L) 01/11/2014 1121   ALBUMIN 3.6  05/25/2021 0053   ALBUMIN 4.3 10/15/2019 1205   ALBUMIN 3.0 (L) 01/11/2014 1121   AST 41 05/25/2021 0053   AST 24 01/11/2014 1121   ALT 37 05/25/2021 0053   ALT 16 01/11/2014 1121   ALKPHOS 71 05/25/2021 0053   ALKPHOS 69 01/11/2014 1121   BILITOT 0.8 05/25/2021 0053   BILITOT 1.4 (H) 10/15/2019 1205   BILITOT 0.4 01/11/2014 1121   GFRNONAA >60 05/28/2021 0544   GFRNONAA 82 12/29/2020 0000   GFRAA 95 12/29/2020 0000   Lab Results  Component Value Date   WBC 5.9 05/28/2021   HGB 8.7 (L) 05/28/2021   HCT 26.3 (L) 05/28/2021   MCV 94.6 05/28/2021   PLT 214 05/28/2021    Imaging Studies:   Assessment and Plan:   Holly Green is a 75 y.o. y/o female recently admitted in July 2022 with diverticulitis and lower GI bleed here for follow-up  Diverticulitis and lower GI bleed symptoms have completely resolved  However, given her recurrent episodes of lower GI bleed, would recommend evaluation by surgery to prevent future episodes.  Patient agreeable to referral  Obtain hemoglobin at this time to evaluate for improvement since hospital discharge  High-fiber diet MiraLAX daily with goal of 1-2 soft bowel movements daily.  If not at goal, patient instructed to increase dose to twice daily.  If loose stools with the medication, patient asked to decrease the medication to every other day, or half dose daily.  Patient verbalized understanding  Patient reports taking MiraLAX daily and is having 1-2 soft bowel movements a day with it.    Dr Holly Green

## 2021-06-28 LAB — HEMOGLOBIN: Hemoglobin: 10.4 g/dL — ABNORMAL LOW (ref 11.1–15.9)

## 2021-06-29 ENCOUNTER — Other Ambulatory Visit: Payer: Self-pay

## 2021-06-29 ENCOUNTER — Encounter: Payer: Self-pay | Admitting: Family Medicine

## 2021-06-29 ENCOUNTER — Ambulatory Visit (INDEPENDENT_AMBULATORY_CARE_PROVIDER_SITE_OTHER): Payer: Medicare Other | Admitting: Family Medicine

## 2021-06-29 VITALS — BP 138/82 | HR 71 | Temp 97.8°F | Resp 16 | Ht 66.0 in | Wt 190.6 lb

## 2021-06-29 DIAGNOSIS — E782 Mixed hyperlipidemia: Secondary | ICD-10-CM | POA: Diagnosis not present

## 2021-06-29 DIAGNOSIS — M0609 Rheumatoid arthritis without rheumatoid factor, multiple sites: Secondary | ICD-10-CM | POA: Diagnosis not present

## 2021-06-29 DIAGNOSIS — Z79899 Other long term (current) drug therapy: Secondary | ICD-10-CM

## 2021-06-29 DIAGNOSIS — K5730 Diverticulosis of large intestine without perforation or abscess without bleeding: Secondary | ICD-10-CM | POA: Diagnosis not present

## 2021-06-29 DIAGNOSIS — K5792 Diverticulitis of intestine, part unspecified, without perforation or abscess without bleeding: Secondary | ICD-10-CM

## 2021-06-29 DIAGNOSIS — K219 Gastro-esophageal reflux disease without esophagitis: Secondary | ICD-10-CM | POA: Diagnosis not present

## 2021-06-29 DIAGNOSIS — I700 Atherosclerosis of aorta: Secondary | ICD-10-CM

## 2021-06-29 DIAGNOSIS — Z5181 Encounter for therapeutic drug level monitoring: Secondary | ICD-10-CM | POA: Diagnosis not present

## 2021-06-29 DIAGNOSIS — I10 Essential (primary) hypertension: Secondary | ICD-10-CM

## 2021-06-29 DIAGNOSIS — E66811 Obesity, class 1: Secondary | ICD-10-CM

## 2021-06-29 DIAGNOSIS — D509 Iron deficiency anemia, unspecified: Secondary | ICD-10-CM | POA: Diagnosis not present

## 2021-06-29 DIAGNOSIS — E669 Obesity, unspecified: Secondary | ICD-10-CM

## 2021-06-29 DIAGNOSIS — R7303 Prediabetes: Secondary | ICD-10-CM | POA: Diagnosis not present

## 2021-06-29 DIAGNOSIS — M810 Age-related osteoporosis without current pathological fracture: Secondary | ICD-10-CM | POA: Diagnosis not present

## 2021-06-29 MED ORDER — BISOPROLOL-HYDROCHLOROTHIAZIDE 10-6.25 MG PO TABS: 1.0000 | ORAL_TABLET | Freq: Every day | ORAL | 3 refills | Status: DC

## 2021-06-29 MED ORDER — LOSARTAN POTASSIUM 50 MG PO TABS: 50.0000 mg | ORAL_TABLET | Freq: Every day | ORAL | 3 refills | Status: DC

## 2021-06-29 MED ORDER — LOVASTATIN 40 MG PO TABS: 40.0000 mg | ORAL_TABLET | Freq: Every day | ORAL | 3 refills | Status: DC

## 2021-06-29 NOTE — Progress Notes (Signed)
Name: Holly Green   MRN: [id number]    DOB: 1946-11-06   Date:06/29/2021       Progress Note  Chief Complaint  Patient presents with   Hypertension   Hyperlipidemia     Subjective:   Holly Green is a 75 y.o. female, presents to clinic for routine f/up and labs  Anemia, iron deficiency due to blood loss from diverticulitis flares - gradually worsening, in the hospital again last month, she did f/up with GI - Dr. Bonna Gains who wants her to consult with general surgery again - hemoglobin was checked which was improving from last labs in hospital, cbc not done Hemoglobin  Date Value Ref Range Status  06/27/2021 10.4 (L) 11.1 - 15.9 g/dL Final  05/28/2021 8.7 (L) 12.0 - 15.0 g/dL Final  05/27/2021 8.9 (L) 12.0 - 15.0 g/dL Final  05/26/2021 9.1 (L) 12.0 - 15.0 g/dL Final  05/26/2021 8.6 (L) 12.0 - 15.0 g/dL Final  10/15/2019 12.5 11.1 - 15.9 g/dL Final  12/15/2017 11.0 (L) 11.1 - 15.9 g/dL Final  04/08/2016 11.3 11.1 - 15.9 g/dL Final   She has f/up with Dr. Tasia Catchings who will check labs done today - needs cbc and iron panel labs Lab Results  Component Value Date   IRON 70 12/29/2020   TIBC 346 12/29/2020   FERRITIN 60 12/29/2020  Not on iron supplements  Hypertension:  Currently managed on bisoprolol-HCTZ and losartan  Pt reports good med compliance and denies any SE.   Blood pressure today is fairly well controlled. BP Readings from Last 3 Encounters:  06/29/21 138/82  06/27/21 (!) 145/80  05/28/21 (!) 143/69   Pt denies CP, SOB, exertional sx, palpitation, Ha's, visual disturbances, lightheadedness, hypotension, syncope. Dietary efforts for BP?  Cutting back on salt Sometimes LLE bothers here and some sx like leg is heavy or something is laying across her leg - chronic trouble sleeping, not describing RLS   Hyperlipidemia: Currently treated with lovastatin 40 mg, pt reports good med compliance, denies SE or concerns, same dose for the past year - due for recheck  labs Last Lipids: Lab Results  Component Value Date   CHOL 178 06/14/2020   HDL 82 06/14/2020   LDLCALC 84 06/14/2020   TRIG 50 06/14/2020   CHOLHDL 2.2 06/14/2020   - Denies: Chest pain, shortness of breath, myalgias, claudication  Rheumatology - managing RA on methotrexate, injection - Certolizumab Pegol and on folate supplement - follows with Dr. Posey Pronto at [place]  Chronic constipation - takes miralax prn, no concerns with constipation Denies abd pain, diarrhea, constipation, blood in stool recently  Prediabetes: Recent pertinent labs: Lab Results  Component Value Date   HGBA1C 5.9 (H) 12/29/2020   HGBA1C 5.8 (H) 10/15/2019   HGBA1C 5.9 (H) 02/08/2019   Chronic cough and prior dx of COPD - pt not symptomatic, not on inhalers, previously had cough SE from ACEI - saw pulmonology and they said she doesn't have COPD, she has a one year f/up but may d/c pulm management at that time   Current Outpatient Medications:    acetaminophen (TYLENOL) 650 MG CR tablet, Take 650-1,300 mg by mouth every 8 (eight) hours as needed for pain., Disp: , Rfl:    bisoprolol-hydrochlorothiazide (ZIAC) 10-6.25 MG tablet, Take 1 tablet by mouth daily., Disp: 90 tablet, Rfl: 3   Calcium Carb-Cholecalciferol (CALCIUM-VITAMIN D) 600-400 MG-UNIT TABS, Take 1 tablet by mouth daily., Disp: , Rfl:    Certolizumab Pegol 2 X 200 MG/ML PSKT, Inject  400 mg into the skin every 28 (twenty-eight) days., Disp: , Rfl:    folic acid (FOLVITE) 1 MG tablet, Take 1 mg by mouth daily., Disp: , Rfl:    loratadine (CLARITIN) 10 MG tablet, Take 1 tablet (10 mg total) by mouth daily., Disp: 30 tablet, Rfl: 11   losartan (COZAAR) 50 MG tablet, Take 1 tablet (50 mg total) by mouth daily., Disp: 90 tablet, Rfl: 3   lovastatin (MEVACOR) 40 MG tablet, Take 1 tablet (40 mg total) by mouth at bedtime., Disp: 90 tablet, Rfl: 3   methotrexate 2.5 MG tablet, Take 10 mg by mouth once a week. Pt takes every sunday, Disp: , Rfl:    Multiple  Vitamin (MULTI-VITAMIN) tablet, Take 1 tablet by mouth daily., Disp: , Rfl:    polyethylene glycol (MIRALAX) 17 g packet, Take 17 g by mouth daily., Disp: 30 each, Rfl: 2  Patient Active Problem List   Diagnosis Date Noted   Rheumatoid arthritis of multiple sites with negative rheumatoid factor ([place]) 06/09/2020   Obesity (BMI 30.0-34.9) 08/10/2018   Emphysema of lung ([place]) 08/10/2018   Allergic rhinitis 08/10/2018   Primary osteoarthritis of right knee 07/17/2018   Spondylolisthesis at L4-L5 level 03/07/2017   Abdominal aortic atherosclerosis ([place]) 03/05/2017   Benign liver cyst 03/05/2017   GERD (gastroesophageal reflux disease) 08/07/2016   Osteoporosis 05/07/2016   Prediabetes 10/15/2015   Hyperlipidemia 10/15/2015   Essential hypertension 10/15/2015   Diverticulosis of colon, acquired 10/15/2015    Past Surgical History:  Procedure Laterality Date   ABDOMINAL HYSTERECTOMY     BACK SURGERY  2010   BREAST CYST ASPIRATION Right 1990's   COLONOSCOPY Left 11/09/2017   Procedure: COLONOSCOPY;  Surgeon: Virgel Manifold, MD;  Location: [facility] ENDOSCOPY;  Service: Endoscopy;  Laterality: Left;   COLONOSCOPY WITH PROPOFOL N/A 04/14/2020   Procedure: COLONOSCOPY WITH PROPOFOL;  Surgeon: Virgel Manifold, MD;  Location: [facility] ENDOSCOPY;  Service: Endoscopy;  Laterality: N/A;   CYSTO WITH HYDRODISTENSION N/A 08/15/2017   Procedure: CYSTOSCOPY/HYDRODISTENSION;  Surgeon: Nickie Retort, MD;  Location: [facility] ORS;  Service: Urology;  Laterality: N/A;   ESOPHAGOGASTRODUODENOSCOPY Left 11/09/2017   Procedure: ESOPHAGOGASTRODUODENOSCOPY (EGD);  Surgeon: Virgel Manifold, MD;  Location: [facility] ENDOSCOPY;  Service: Endoscopy;  Laterality: Left;   ESOPHAGOGASTRODUODENOSCOPY (EGD) WITH PROPOFOL N/A 02/22/2020   Procedure: ESOPHAGOGASTRODUODENOSCOPY (EGD) WITH PROPOFOL;  Surgeon: Lucilla Lame, MD;  Location: [facility] ENDOSCOPY;  Service: Endoscopy;  Laterality: N/A;   FOOT SURGERY Right      Family History  Problem Relation Age of Onset   Hypertension Brother    Heart disease Brother    Heart failure Brother    Hypertension Mother    Stroke Mother    Stroke Father    Prostate cancer Father    Aneurysm Sister    Breast cancer Sister 1   Liver disease Maternal Aunt    Heart failure Paternal Aunt    Heart disease Paternal Uncle    Heart attack Brother    Heart disease Brother    Breast cancer Maternal Aunt    Heart disease Paternal Aunt    Kidney cancer Neg Hx    Bladder Cancer Neg Hx     Social History   Tobacco Use   Smoking status: Never   Smokeless tobacco: Never  Vaping Use   Vaping Use: Never used  Substance Use Topics   Alcohol use: No    Alcohol/week: 0.0 standard drinks   Drug use: No  Allergies  Allergen Reactions   Aspirin Other (See Comments)    Diverticulosis   Lisinopril Cough    Health Maintenance  Topic Date Due   COVID-19 Vaccine (4 - Booster for Pfizer series) 07/15/2021 (Originally 12/12/2020)   INFLUENZA VACCINE  08/08/2021 (Originally 06/25/2021)   Zoster Vaccines- Shingrix (1 of 2) 09/29/2021 (Originally 08/17/1965)   MAMMOGRAM  08/21/2021   TETANUS/TDAP  06/15/2022   DEXA SCAN  08/21/2022   COLONOSCOPY (Pts 45-69yr Insurance coverage will need to be confirmed)  04/14/2025   Hepatitis C Screening  Completed   PNA vac Low Risk Adult  Completed   HPV VACCINES  Aged Out    Chart Review Today: I personally reviewed active problem list, medication list, allergies, family history, social history, health maintenance, notes from last encounter, lab results, imaging with the patient/caregiver today. Extensive chart review with recent hospitalization, multiple specialists visits notes and labs reviewed in EMR and through care everywhere  Review of Systems  Constitutional: Negative.   HENT: Negative.    Eyes: Negative.   Respiratory: Negative.    Cardiovascular: Negative.   Gastrointestinal: Negative.   Endocrine:  Negative.   Genitourinary: Negative.   Musculoskeletal: Negative.   Skin: Negative.   Allergic/Immunologic: Negative.   Neurological: Negative.   Hematological: Negative.   Psychiatric/Behavioral: Negative.    All other systems reviewed and are negative.   Objective:   Vitals:   06/29/21 0938  BP: 138/82  Pulse: 71  Resp: 16  Temp: 97.8 F (36.6 C)  SpO2: 97%  Weight: 190 lb 9.6 oz (86.5 kg)  Height: '5\' 6"'$  (1.676 m)    Body mass index is 30.76 kg/m.  Physical Exam Vitals and nursing note reviewed.  Constitutional:      General: She is not in acute distress.    Appearance: Normal appearance. She is well-developed and well-groomed. She is obese. She is not ill-appearing, toxic-appearing or diaphoretic.     Interventions: Face mask in place.     Comments: Well appearing elderly female, NAD, appears stated age  HENT:     Head: Normocephalic and atraumatic.     Right Ear: External ear normal.     Left Ear: External ear normal.  Eyes:     General: Lids are normal. No scleral icterus.       Right eye: No discharge.        Left eye: No discharge.     Conjunctiva/sclera: Conjunctivae normal.  Neck:     Trachea: Phonation normal. No tracheal deviation.  Cardiovascular:     Rate and Rhythm: Normal rate and regular rhythm.     Pulses: Normal pulses.          Radial pulses are 2+ on the right side and 2+ on the left side.       Posterior tibial pulses are 2+ on the right side and 2+ on the left side.     Heart sounds: Normal heart sounds. No murmur heard.   No friction rub. No gallop.  Pulmonary:     Effort: Pulmonary effort is normal. No respiratory distress.     Breath sounds: Normal breath sounds. No stridor. No wheezing, rhonchi or rales.  Chest:     Chest wall: No tenderness.  Abdominal:     General: Bowel sounds are normal. There is no distension.     Palpations: Abdomen is soft.     Tenderness: There is no abdominal tenderness. There is no guarding or rebound.   Musculoskeletal:  Right lower leg: No edema.     Left lower leg: No edema.  Skin:    General: Skin is warm and dry.     Coloration: Skin is not jaundiced or pale.     Findings: No rash.  Neurological:     Mental Status: She is alert. Mental status is at baseline.     Motor: No abnormal muscle tone.     Gait: Gait normal.  Psychiatric:        Mood and Affect: Mood normal.        Speech: Speech normal.        Behavior: Behavior normal. Behavior is cooperative.        Assessment & Plan:     ICD-10-CM   1. Essential hypertension  I10 bisoprolol-hydrochlorothiazide (ZIAC) 10-6.25 MG tablet    losartan (COZAAR) 50 MG tablet    COMPLETE METABOLIC PANEL WITH GFR   well controlled w/o hydralazine right now, continue meds, labs today, f/up in 6 months, continue low salt, BP at goal today    2. Mixed hyperlipidemia  E78.2 lovastatin (MEVACOR) 40 MG tablet    Lipid panel    COMPLETE METABOLIC PANEL WITH GFR   compliant with statin, no SE or myalgias reported, last lipid panel 05/2020 will do lipid panel    3. Abdominal aortic atherosclerosis (HCC)  I70.0 lovastatin (MEVACOR) 40 MG tablet    Lipid panel    COMPLETE METABOLIC PANEL WITH GFR   on statin, monitoring    4. Prediabetes  R73.03 Hemoglobin A1C    COMPLETE METABOLIC PANEL WITH GFR   recheck, no hyperglycemia sx    5. Iron deficiency anemia, unspecified iron deficiency anemia type  D50.9 CBC with Differential/Platelet    Iron, TIBC and Ferritin Panel   blood loss, and hx of anemia - following with hem/onc, labs today     6. Diverticulosis of colon, acquired  K57.30    worse flares more frequency - follow up with GI and referred to surgery    7. Rheumatoid arthritis of multiple sites with negative rheumatoid factor (HCC)  M06.09 CBC with Differential/Platelet    COMPLETE METABOLIC PANEL WITH GFR   per Posey Pronto    8. Obesity (BMI 30.0-34.9)  E66.9    weight down, improving, working on diet    9. Age-related  osteoporosis without current pathological fracture  [id number] COMPLETE METABOLIC PANEL WITH GFR   need to f/up on next dexa and labs/med hx to see if anything is currently needed - some f/up done at [facility]    10. Gastroesophageal reflux disease, unspecified whether esophagitis present  K21.9    per GI - not on meds    11. Long-term use of high-risk medication  Z79.899 CBC with Differential/Platelet    COMPLETE METABOLIC PANEL WITH GFR   rheumatology meds - monitoring    12. Encounter for medication monitoring  Z51.81 Lipid panel    Hemoglobin A1C    CBC with Differential/Platelet    COMPLETE METABOLIC PANEL WITH GFR    Iron, TIBC and Ferritin Panel       Return in about 6 months (around 12/30/2021) for htn.   Delsa Grana, PA-C 06/29/21 10:08 AM

## 2021-06-30 LAB — CBC WITH DIFFERENTIAL/PLATELET
Absolute Monocytes: 468 cells/uL (ref 200–950)
Basophils Absolute: 18 cells/uL (ref 0–200)
Basophils Relative: 0.4 %
Eosinophils Absolute: 122 cells/uL (ref 15–500)
Eosinophils Relative: 2.7 %
HCT: 31 % — ABNORMAL LOW (ref 35.0–45.0)
Hemoglobin: 10.3 g/dL — ABNORMAL LOW (ref 11.7–15.5)
Lymphs Abs: 1944 cells/uL (ref 850–3900)
MCH: 32.3 pg (ref 27.0–33.0)
MCHC: 33.2 g/dL (ref 32.0–36.0)
MCV: 97.2 fL (ref 80.0–100.0)
MPV: 8.8 fL (ref 7.5–12.5)
Monocytes Relative: 10.4 %
Neutro Abs: 1949 cells/uL (ref 1500–7800)
Neutrophils Relative %: 43.3 %
Platelets: 256 10*3/uL (ref 140–400)
RBC: 3.19 10*6/uL — ABNORMAL LOW (ref 3.80–5.10)
RDW: 13.7 % (ref 11.0–15.0)
Total Lymphocyte: 43.2 %
WBC: 4.5 10*3/uL (ref 3.8–10.8)

## 2021-06-30 LAB — COMPLETE METABOLIC PANEL WITH GFR
AG Ratio: 1.4 (calc) (ref 1.0–2.5)
ALT: 18 U/L (ref 6–29)
AST: 26 U/L (ref 10–35)
Albumin: 4.2 g/dL (ref 3.6–5.1)
Alkaline phosphatase (APISO): 70 U/L (ref 37–153)
BUN/Creatinine Ratio: 33 (calc) — ABNORMAL HIGH (ref 6–22)
BUN: 26 mg/dL — ABNORMAL HIGH (ref 7–25)
CO2: 26 mmol/L (ref 20–32)
Calcium: 9.5 mg/dL (ref 8.6–10.4)
Chloride: 105 mmol/L (ref 98–110)
Creat: 0.78 mg/dL (ref 0.60–1.00)
Globulin: 2.9 g/dL (calc) (ref 1.9–3.7)
Glucose, Bld: 93 mg/dL (ref 65–99)
Potassium: 3.9 mmol/L (ref 3.5–5.3)
Sodium: 139 mmol/L (ref 135–146)
Total Bilirubin: 1.3 mg/dL — ABNORMAL HIGH (ref 0.2–1.2)
Total Protein: 7.1 g/dL (ref 6.1–8.1)
eGFR: 80 mL/min/{1.73_m2} (ref >=60)

## 2021-06-30 LAB — LIPID PANEL
Cholesterol: 161 mg/dL (ref <200)
HDL: 81 mg/dL (ref >=50)
LDL Cholesterol (Calc): 69 mg/dL (calc)
Non-HDL Cholesterol (Calc): 80 mg/dL (calc) (ref <130)
Total CHOL/HDL Ratio: 2 (calc) (ref <5.0)
Triglycerides: 40 mg/dL (ref <150)

## 2021-06-30 LAB — IRON,TIBC AND FERRITIN PANEL
%SAT: 11 % (calc) — ABNORMAL LOW (ref 16–45)
Ferritin: 36 ng/mL (ref 16–288)
Iron: 39 ug/dL — ABNORMAL LOW (ref 45–160)
TIBC: 369 mcg/dL (calc) (ref 250–450)

## 2021-06-30 LAB — HEMOGLOBIN A1C
Hgb A1c MFr Bld: 5 % of total Hgb (ref <5.7)
Mean Plasma Glucose: 97 mg/dL
eAG (mmol/L): 5.4 mmol/L

## 2021-07-02 ENCOUNTER — Telehealth: Payer: Self-pay | Admitting: Oncology

## 2021-07-02 NOTE — Telephone Encounter (Signed)
Pt called back to return call from [name]. Advised of upcoming appt 08/11 at 9:30AM.

## 2021-07-05 ENCOUNTER — Encounter: Payer: Self-pay | Admitting: Oncology

## 2021-07-05 ENCOUNTER — Inpatient Hospital Stay: Payer: Medicare Other | Attending: Oncology | Admitting: Oncology

## 2021-07-05 ENCOUNTER — Inpatient Hospital Stay: Payer: Medicare Other | Admitting: Oncology

## 2021-07-05 VITALS — BP 173/91 | HR 63 | Temp 96.3°F | Resp 18 | Wt 189.0 lb

## 2021-07-05 DIAGNOSIS — D500 Iron deficiency anemia secondary to blood loss (chronic): Secondary | ICD-10-CM

## 2021-07-05 DIAGNOSIS — M0609 Rheumatoid arthritis without rheumatoid factor, multiple sites: Secondary | ICD-10-CM | POA: Diagnosis not present

## 2021-07-05 DIAGNOSIS — I10 Essential (primary) hypertension: Secondary | ICD-10-CM | POA: Insufficient documentation

## 2021-07-05 DIAGNOSIS — J449 Chronic obstructive pulmonary disease, unspecified: Secondary | ICD-10-CM | POA: Insufficient documentation

## 2021-07-05 DIAGNOSIS — Z79899 Other long term (current) drug therapy: Secondary | ICD-10-CM | POA: Diagnosis not present

## 2021-07-05 DIAGNOSIS — E785 Hyperlipidemia, unspecified: Secondary | ICD-10-CM | POA: Insufficient documentation

## 2021-07-05 DIAGNOSIS — D509 Iron deficiency anemia, unspecified: Secondary | ICD-10-CM

## 2021-07-05 MED ORDER — FERROUS SULFATE 325 (65 FE) MG PO TABS
ORAL_TABLET | ORAL | 2 refills | Status: DC
Start: 2021-07-05 — End: 2023-07-22

## 2021-07-05 NOTE — Progress Notes (Signed)
Pt in for follow up, reports had labs drawn on June 29, 2021. Pt denies any concerns today.

## 2021-07-05 NOTE — Progress Notes (Signed)
Hematology/Oncology follow-up note Holly Green [phone] [phone]   Patient Care Team: Delsa Grana, PA-C as PCP - General (Family Medicine) Virgel Manifold, MD as Consulting Physician (Gastroenterology) Earlie Server, MD as Consulting Physician (Hematology and Oncology) Quintin Alto, MD as Consulting Physician (Rheumatology)  REFERRING PROVIDER: Delsa Grana, PA-C CHIEF COMPLAINTS/REASON FOR VISIT:  Follow-up in iron deficiency anemia  HISTORY OF PRESENTING ILLNESS Holly Green is a  75 y.o.  female with [place] listed below was seen in consultation at the request of Delsa Grana, PA-C   for evaluation of iron deficiency anemia.  Patient has a remote history of diverticula bleeding status post positive bleeding scan and embolization 2009 and 2015. Patient was admitted from 12/29/2019-12/31/2019 due to GI bleeding.  Patient had a 2 g hemoglobin drop over the past 2 months prior to admission.  Hemoglobin was 10.  Iron panel showed iron deficiency with ferritin off 32 and iron saturation at 11.  Conservative measures was recommended by gastroenterology.  Patient was discharged. 02/22/2020, EGD showed small hiatal hernia, normal stomach and duodenum.  No specimen was collected. 11/09/2017, colonoscopy showed: Diverticulosis.  Otherwise normal. No recent blood work after February was done. Patient is currently taking oral iron supplementation . Associated signs and symptoms: Patient reports fatigue.  SOB with exertion.  Denies weight loss, easy bruising, hematochezia, hemoptysis, hematuria.   INTERVAL HISTORY Holly Green is a 75 year old female who presents back for follow-up for anemia.  She is currently tolerating oral iron supplements daily.  Denies any side effects.  She was last seen in clinic by Dr. Tasia Catchings on 01/05/2021.  In the interim she was hospitalized on 05/19/2021 for rectal bleeding and abdominal pain.  Imaging showed diverticulitis and she  was treated with Augmentin and discharged home.  She was seen again on 05/25/2021 for same concern but noted to have significant drop in her hemoglobin. She was given IV Zosyn and 1 unit PRBC.  She was admitted and discharged on 05/28/2021.  GI did not recommend additional scoping and she was treated with additional IV antibiotics and transition to oral prior to discharge. Hemoglobin improved to 10.3.   Since discharge she was seen by GI and denies any additional bleeding or abdominal concerns.  She was referred to surgery given increased episodes of diverticulitis.  She has appointment in the next couple weeks.  Overall, patient feels improved since her recent hospitalization.  Denies any additional GI bleeding or abdominal concerns.  She is currently not taking iron supplements and is taking a multivitamin with iron in it.  Reports that oral iron sometimes makes her constipated and she requires MiraLAX daily.  She has chronic but stable fatigue.  She still works full-time and is constantly on the go.   Review of Systems  Constitutional:  Positive for fatigue (Stable). Negative for appetite change, chills and fever.  HENT:   Negative for hearing loss and voice change.   Eyes:  Negative for eye problems.  Respiratory:  Negative for chest tightness and cough.   Cardiovascular:  Negative for chest pain.  Gastrointestinal:  Negative for abdominal distention, abdominal pain and blood in stool.  Endocrine: Negative for hot flashes.  Genitourinary:  Negative for difficulty urinating and frequency.   Musculoskeletal:  Negative for arthralgias.  Skin:  Negative for itching and rash.  Neurological:  Negative for extremity weakness.  Hematological:  Negative for adenopathy.  Psychiatric/Behavioral:  Negative for confusion.    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, of knee, 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   COAD (chronic obstructive airways disease) ([place]) 08/03/2018   Diverticulitis 2015   Gastrointestinal hemorrhage 12/29/2019   GERD (gastroesophageal reflux disease)    GI bleeding 05/25/2021   History of GI bleed 11/06/2017   History of kidney stones    History of stomach ulcers    Hyperlipidemia    Hypertension    Left hip pain 10/15/2019   Osteoporosis    Pre-diabetes    Prediabetes 10/15/2015   Simple cyst of kidney 03/05/2017   CT scan April 2018   Spondylolisthesis at L4-L5 level 03/07/2017    SURGICAL HISTORY: Past Surgical History:  Procedure Laterality Date   ABDOMINAL HYSTERECTOMY     BACK SURGERY  2010   BREAST CYST ASPIRATION Right 1990's   COLONOSCOPY Left 11/09/2017   Procedure: COLONOSCOPY;  Surgeon: Virgel Manifold, MD;  Location: [facility] ENDOSCOPY;  Service: Endoscopy;  Laterality: Left;   COLONOSCOPY WITH PROPOFOL N/A 04/14/2020   Procedure: COLONOSCOPY WITH PROPOFOL;  Surgeon: Virgel Manifold, MD;  Location: [facility] ENDOSCOPY;  Service: Endoscopy;  Laterality: N/A;   CYSTO WITH HYDRODISTENSION N/A 08/15/2017   Procedure: CYSTOSCOPY/HYDRODISTENSION;  Surgeon: Nickie Retort, MD;  Location: [facility] ORS;  Service: Urology;  Laterality: N/A;   ESOPHAGOGASTRODUODENOSCOPY Left 11/09/2017   Procedure: ESOPHAGOGASTRODUODENOSCOPY (EGD);  Surgeon: Virgel Manifold, MD;  Location[facility] ENDOSCOPY;  Service: Endoscopy;  Laterality: Left;   ESOPHAGOGASTRODUODENOSCOPY (EGD) WITH PROPOFOL N/A 02/22/2020   Procedure: ESOPHAGOGASTRODUODENOSCOPY (EGD) WITH PROPOFOL;  Surgeon: Lucilla Lame, MD;  Location: [facility] ENDOSCOPY;  Service: Endoscopy;  Laterality: N/A;   FOOT SURGERY Right     SOCIAL HISTORY: Social History   Socioeconomic  History   Marital status: Single    Spouse name: Not on file   Number of children: 1   Years of education: Not on file   Highest education level: High school graduate  Occupational History   Occupation: retired  Tobacco Use   Smoking status: Never   Smokeless tobacco: Never  Vaping Use   Vaping Use: Never used  Substance and Sexual Activity   Alcohol use: No    Alcohol/week: 0.0 standard drinks   Drug use: No   Sexual activity: Never  Other Topics Concern   Not on file  Social History Narrative   Pt's nephew lives with her   Social Determinants of Health   Financial Resource Strain: Low Risk    Difficulty of Paying Living Expenses: Not hard at all  Food Insecurity: No Food Insecurity   Worried About Charity fundraiser in the Last Year: Never true   Arboriculturist in the Last Year: Never true  Transportation Needs: No Transportation Needs   Lack of Transportation (Medical): No   Lack of Transportation (Non-Medical): No  Physical Activity: Insufficiently Active   Days of Exercise per Week: 2 days   Minutes of Exercise per Session: 20 min  Stress: No Stress Concern Present   Feeling of Stress : Not at all  Social Connections: Moderately Isolated   Frequency of Communication with Friends and Family: More than three times a week   Frequency of Social Gatherings  with Friends and Family: Once a week   Attends Religious Services: More than 4 times per year   Active Member of [employer] or Organizations: No   Attends Music therapist: Never   Marital Status: Never married  Human resources officer Violence: Not At Risk   Fear of Current or Ex-Partner: No   Emotionally Abused: No   Physically Abused: No   Sexually Abused: No    FAMILY HISTORY: Family History  Problem Relation Age of Onset   Hypertension Brother    Heart disease Brother    Heart failure Brother    Hypertension Mother    Stroke Mother    Stroke Father    Prostate cancer Father    Aneurysm Sister     Breast cancer Sister 47   Liver disease Maternal Aunt    Heart failure Paternal Aunt    Heart disease Paternal Uncle    Heart attack Brother    Heart disease Brother    Breast cancer Maternal Aunt    Heart disease Paternal Aunt    Kidney cancer Neg Hx    Bladder Cancer Neg Hx     ALLERGIES:  is allergic to aspirin and lisinopril.  MEDICATIONS:  Current Outpatient Medications  Medication Sig Dispense Refill   acetaminophen (TYLENOL) 650 MG CR tablet Take 650-1,300 mg by mouth every 8 (eight) hours as needed for pain.     bisoprolol-hydrochlorothiazide (ZIAC) 10-6.25 MG tablet Take 1 tablet by mouth daily. 90 tablet 3   Calcium Carb-Cholecalciferol (CALCIUM-VITAMIN D) 600-400 MG-UNIT TABS Take 1 tablet by mouth daily.     Certolizumab Pegol 2 X 200 MG/ML PSKT Inject 400 mg into the skin every 28 (twenty-eight) days.     folic acid (FOLVITE) 1 MG tablet Take 1 mg by mouth daily.     loratadine (CLARITIN) 10 MG tablet Take 1 tablet (10 mg total) by mouth daily. 30 tablet 11   losartan (COZAAR) 50 MG tablet Take 1 tablet (50 mg total) by mouth daily. 90 tablet 3   lovastatin (MEVACOR) 40 MG tablet Take 1 tablet (40 mg total) by mouth at bedtime. 90 tablet 3   methotrexate 2.5 MG tablet Take 10 mg by mouth once a week. Pt takes every sunday     Multiple Vitamin (MULTI-VITAMIN) tablet Take 1 tablet by mouth daily.     polyethylene glycol (MIRALAX) 17 g packet Take 17 g by mouth daily. 30 each 2   No current facility-administered medications for this visit.     PHYSICAL EXAMINATION: ECOG PERFORMANCE STATUS: 1 - Symptomatic but completely ambulatory There were no vitals filed for this visit.  There were no vitals filed for this visit.   Physical Exam Constitutional:      Appearance: Normal appearance.  HENT:     Head: Normocephalic and atraumatic.  Eyes:     Pupils: Pupils are equal, round, and reactive to light.  Cardiovascular:     Rate and Rhythm: Normal rate and regular  rhythm.     Heart sounds: Normal heart sounds. No murmur heard. Pulmonary:     Effort: Pulmonary effort is normal.     Breath sounds: Normal breath sounds. No wheezing.  Abdominal:     General: Bowel sounds are normal. There is no distension.     Palpations: Abdomen is soft.     Tenderness: There is no abdominal tenderness.  Musculoskeletal:        General: Normal range of motion.     Cervical back: Normal  range of motion.  Skin:    General: Skin is warm and dry.     Findings: No rash.  Neurological:     Mental Status: She is alert and oriented to person, place, and time.  Psychiatric:        Judgment: Judgment normal.      CMP Latest Ref Rng & Units 06/29/2021  Glucose 65 - 99 mg/dL 93  BUN 7 - 25 mg/dL 26(H)  Creatinine 0.60 - 1.00 mg/dL 0.78  Sodium 135 - 146 mmol/L 139  Potassium 3.5 - 5.3 mmol/L 3.9  Chloride 98 - 110 mmol/L 105  CO2 20 - 32 mmol/L 26  Calcium 8.6 - 10.4 mg/dL 9.5  Total Protein 6.1 - 8.1 g/dL 7.1  Total Bilirubin 0.2 - 1.2 mg/dL 1.3(H)  Alkaline Phos 38 - 126 U/L -  AST 10 - 35 U/L 26  ALT 6 - 29 U/L 18   CBC Latest Ref Rng & Units 06/29/2021  WBC 3.8 - 10.8 Thousand/uL 4.5  Hemoglobin 11.7 - 15.5 g/dL 10.3(L)  Hematocrit 35.0 - 45.0 % 31.0(L)  Platelets 140 - 400 Thousand/uL 256     LABORATORY DATA:  I have reviewed the data as listed Lab Results  Component Value Date   WBC 4.5 06/29/2021   HGB 10.3 (L) 06/29/2021   HCT 31.0 (L) 06/29/2021   MCV 97.2 06/29/2021   PLT 256 06/29/2021   Recent Labs    09/29/20 1055 12/29/20 0000 05/19/21 1059 05/25/21 0053 05/25/21 0320 05/26/21 0503 05/27/21 0618 05/28/21 0544 06/29/21 0958  NA  --  142 136 139   < > 141 142 138 139  K  --  3.7 4.4 3.8   < > 3.7 3.6 3.7 3.9  CL  --  104 103 107   < > 108 106 104 105  CO2  --  '26 26 25   '$ < > '29 29 27 26  '$ GLUCOSE  --  76 89 131*   < > 98 92 92 93  BUN  --  17 17 29*   < > '13 11 18 '$ 26*  CREATININE  --  0.73 0.61 0.88   < > 0.63 0.63 0.78 0.78   CALCIUM  --  9.3 8.9 9.0   < > 8.7* 9.0 8.8* 9.5  GFRNONAA  --  82 >60 >60   < > >60 >60 >60  --   GFRAA  --  95  --   --   --   --   --   --   --   PROT 7.1 6.7 7.4 6.8  --   --   --   --  7.1  ALBUMIN 3.7  --  3.9 3.6  --   --   --   --   --   AST 30 23 41 41  --   --   --   --  26  ALT '22 14 21 '$ 37  --   --   --   --  18  ALKPHOS 67  --  69 71  --   --   --   --   --   BILITOT 1.5* 1.9* 1.5* 0.8  --   --   --   --  1.3*  BILIDIR 0.3*  --   --   --   --   --   --   --   --   IBILI 1.2*  --   --   --   --   --   --   --   --    < > =  values in this interval not displayed.    Iron/TIBC/Ferritin/ %Sat    Component Value Date/Time   IRON 39 (L) 06/29/2021 0958   IRON 36 12/15/2017 1339   TIBC 369 06/29/2021 0958   TIBC 408 12/15/2017 1339   FERRITIN 36 06/29/2021 0958   FERRITIN 39 12/15/2017 1339   IRONPCTSAT 11 (L) 06/29/2021 [id number]     RADIOGRAPHIC STUDIES: I have personally reviewed the radiological images as listed and agreed with the findings in the report. No results found.  ASSESSMENT & PLAN:  No diagnosis found.  Iron deficiency anemia- Overall, patient has improved since her recent hospitalization.  She did require 1 unit of packed red blood cells due to a decrease in her hemoglobin from diverticulitis flare.  Repeat lab work from 06/29/2021 shows a iron saturation of 11%, ferritin 36 and hemoglobin of 10.3.  Recommend she restart iron supplements.  She would like to hold off on IV iron at this time.  She was prescribed FeroSul by Dr. Sanda Klein in the past which appears to not constipate her as much as OTC iron tablets.  We will represcribe this for her. Return to clinic in about 3 months.   Diverticulosis- She is followed by Dr. Delice Lesch and was hospitalized recently for significant GI bleeding. She is scheduled to have a surgery consult in the next couple weeks given recent increased episodes of diverticulitis.   Disposition- RTC in 3 months for repeat labs (cbc, cmp,  ferritin, iron panel and b12) md assessment and possible IV venofer.   I spent 25 minutes dedicated to the care of this patient (face-to-face and non-face-to-face) on the date of the encounter to include what is described in the assessment and plan.  All questions were answered. The patient knows to call the clinic with any problems questions or concerns.  Cc Delsa Grana, PA-C  Faythe Casa, NP 07/05/2021 10:21 AM

## 2021-07-10 DIAGNOSIS — K625 Hemorrhage of anus and rectum: Secondary | ICD-10-CM | POA: Diagnosis not present

## 2021-07-10 DIAGNOSIS — K5730 Diverticulosis of large intestine without perforation or abscess without bleeding: Secondary | ICD-10-CM | POA: Diagnosis not present

## 2021-08-02 DIAGNOSIS — M0609 Rheumatoid arthritis without rheumatoid factor, multiple sites: Secondary | ICD-10-CM | POA: Diagnosis not present

## 2021-08-27 DIAGNOSIS — L851 Acquired keratosis [keratoderma] palmaris et plantaris: Secondary | ICD-10-CM | POA: Diagnosis not present

## 2021-08-27 DIAGNOSIS — B351 Tinea unguium: Secondary | ICD-10-CM | POA: Diagnosis not present

## 2021-08-27 DIAGNOSIS — M79674 Pain in right toe(s): Secondary | ICD-10-CM | POA: Diagnosis not present

## 2021-08-27 DIAGNOSIS — M79675 Pain in left toe(s): Secondary | ICD-10-CM | POA: Diagnosis not present

## 2021-09-04 DIAGNOSIS — M170 Bilateral primary osteoarthritis of knee: Secondary | ICD-10-CM | POA: Diagnosis not present

## 2021-09-04 DIAGNOSIS — M1120 Other chondrocalcinosis, unspecified site: Secondary | ICD-10-CM | POA: Diagnosis not present

## 2021-09-04 DIAGNOSIS — M0609 Rheumatoid arthritis without rheumatoid factor, multiple sites: Secondary | ICD-10-CM | POA: Diagnosis not present

## 2021-09-04 DIAGNOSIS — Z7960 Long term (current) use of unspecified immunomodulators and immunosuppressants: Secondary | ICD-10-CM | POA: Diagnosis not present

## 2021-09-13 ENCOUNTER — Encounter: Payer: Self-pay | Admitting: Gastroenterology

## 2021-09-13 ENCOUNTER — Ambulatory Visit (INDEPENDENT_AMBULATORY_CARE_PROVIDER_SITE_OTHER): Payer: Medicare Other | Admitting: Gastroenterology

## 2021-09-13 ENCOUNTER — Other Ambulatory Visit: Payer: Self-pay

## 2021-09-13 VITALS — BP 152/88 | HR 65 | Temp 98.1°F | Wt 192.0 lb

## 2021-09-13 DIAGNOSIS — K5790 Diverticulosis of intestine, part unspecified, without perforation or abscess without bleeding: Secondary | ICD-10-CM | POA: Diagnosis not present

## 2021-09-14 NOTE — Progress Notes (Signed)
Holly Antigua, MD [address]  [place]  [place], [place] [address]  Main: [phone]  Fax: [phone]   Primary Care Physician: Delsa Grana, PA-C   Chief complaint: Follow-up for diverticulosis, rectal bleeding  HPI: Holly Green is a 76 y.o. female who was admitted in July 2022 for rectal bleeding, with previous diverticulitis history as well, here for follow-up.  Reports having soft bowel movements at home, without straining or constipation.  No episodes of bright red blood per rectum over the last few months.  Was seen by general surgery, Dr. Tollie Pizza.  Note reviewed in Care Everywhere and they discussed conservative management versus surgical options.  [name]  Previous history: colonoscopy in May 2021 showed diverticulosis and was otherwise normal.  Hemoglobin improved and normal at this time.   She underwent EGD in March 2021 due to a CT showing gastric thickening.  Upper endoscopy was normal except for hiatal hernia   Patient has a history of diverticular bleeding, last hospitalized for it in February 2021.   2018 procedures during hospitalization: "She underwent an EGD that was normal on that admission, and a colonoscopy that showed diffuse diverticulosis, and the cecum, ascending colon, descending colon and sigmoid colon.  Majority of the diverticulosis was in the left colon.  None of these were actively bleeding and no intervention was needed during the colonoscopy.    Repeat recommended in 5 years.   She has history of diverticular bleeds in the past.  In 2015 she had similar symptoms and and colonoscopy was planned but due to significant bleeding, RBC scan was done and was positive for hemorrhage in the right colon likely related to bleeding diverticulum.  Patient underwent coil embolization at that time.  Prior to this in  2009 she also had a positive RBC scan in the mid transverse and descending colon consistent with GI bleed.   In 2012 patient had an upper endoscopy which showed hiatal hernia, erosive gastropathy, colonoscopy was done for a lot of abdominal pain at the time and showed solitary ulcer in the descending colon."   ROS: All ROS reviewed and negative except as per HPI   Past Medical History:  Diagnosis Date   Abdominal aortic atherosclerosis ([place]) 03/05/2017   CT scan April 2018   Acute cystitis with hematuria 09/29/2020   Arthritis    Bleeding disorder ([place])    Cardiac murmur 05/07/2016   Chondrocalcinosis 07/17/2018   Last Assessment & Plan:  Mild lateral compartment OA chondrocalcinosis   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, of knee, 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   COAD (chronic obstructive airways disease) ([place]) 08/03/2018   Diverticulitis 2015   Gastrointestinal hemorrhage 12/29/2019   GERD (gastroesophageal reflux disease)    GI bleeding 05/25/2021   History of GI bleed 11/06/2017   History of kidney stones    History of stomach ulcers    Hyperlipidemia    Hypertension    Left hip pain 10/15/2019   Osteoporosis    Pre-diabetes    Prediabetes 10/15/2015   Simple cyst of kidney 03/05/2017   CT scan April 2018   Spondylolisthesis at L4-L5 level 03/07/2017    Past Surgical History:  Procedure Laterality Date   ABDOMINAL HYSTERECTOMY     BACK SURGERY  2010   BREAST CYST ASPIRATION Right 1990's   COLONOSCOPY Left 11/09/2017   Procedure: COLONOSCOPY;  Surgeon: Virgel Manifold, MD;  Location: [facility] ENDOSCOPY;  Service: Endoscopy;  Laterality: Left;   COLONOSCOPY WITH PROPOFOL N/A 04/14/2020   Procedure: COLONOSCOPY WITH PROPOFOL;  Surgeon: Virgel Manifold, MD;  Location: [facility] ENDOSCOPY;  Service: Endoscopy;  Laterality: N/A;   CYSTO WITH HYDRODISTENSION N/A 08/15/2017   Procedure: CYSTOSCOPY/HYDRODISTENSION;  Surgeon: Nickie Retort, MD;  Location: [facility] ORS;  Service: Urology;   Laterality: N/A;   ESOPHAGOGASTRODUODENOSCOPY Left 11/09/2017   Procedure: ESOPHAGOGASTRODUODENOSCOPY (EGD);  Surgeon: Virgel Manifold, MD;  Location: [facility] ENDOSCOPY;  Service: Endoscopy;  Laterality: Left;   ESOPHAGOGASTRODUODENOSCOPY (EGD) WITH PROPOFOL N/A 02/22/2020   Procedure: ESOPHAGOGASTRODUODENOSCOPY (EGD) WITH PROPOFOL;  Surgeon: Lucilla Lame, MD;  Location: [facility] ENDOSCOPY;  Service: Endoscopy;  Laterality: N/A;   FOOT SURGERY Right     Prior to Admission medications   Medication Sig Start Date End Date Taking? Authorizing Provider  acetaminophen (TYLENOL) 650 MG CR tablet Take 650-1,300 mg by mouth every 8 (eight) hours as needed for pain.   Yes [provider]  bisoprolol-hydrochlorothiazide (ZIAC) 10-6.25 MG tablet Take 1 tablet by mouth daily. 06/29/21  Yes Delsa Grana, PA-C  Calcium Carb-Cholecalciferol (CALCIUM-VITAMIN D) 600-400 MG-UNIT TABS Take 1 tablet by mouth daily.   Yes [provider]  Certolizumab Pegol 2 X 200 MG/ML PSKT Inject 400 mg into the skin every 28 (twenty-eight) days. 05/01/21  Yes [provider]  ferrous sulfate (FERROUSUL) 325 (65 FE) MG tablet Take 1 tablet daily. 07/05/21  Yes Jacquelin Hawking, NP  loratadine (CLARITIN) 10 MG tablet Take 1 tablet (10 mg total) by mouth daily. 10/26/19  Yes Hubbard Hartshorn, FNP  losartan (COZAAR) 50 MG tablet Take 1 tablet (50 mg total) by mouth daily. 06/29/21  Yes Delsa Grana, PA-C  lovastatin (MEVACOR) 40 MG tablet Take 1 tablet (40 mg total) by mouth at bedtime. 06/29/21  Yes Delsa Grana, PA-C  methotrexate 2.5 MG tablet Take 10 mg by mouth once a week. Pt takes every sunday 09/07/20  Yes [provider]  Multiple Vitamin (MULTI-VITAMIN) tablet Take 1 tablet by mouth daily.   Yes [provider]  polyethylene glycol (MIRALAX) 17 g packet Take 17 g by mouth daily. 01/18/20  Yes Virgel Manifold, MD    Family History  Problem Relation Age of Onset   Hypertension Brother     Heart disease Brother    Heart failure Brother    Hypertension Mother    Stroke Mother    Stroke Father    Prostate cancer Father    Aneurysm Sister    Breast cancer Sister 77   Liver disease Maternal Aunt    Heart failure Paternal Aunt    Heart disease Paternal Uncle    Heart attack Brother    Heart disease Brother    Breast cancer Maternal Aunt    Heart disease Paternal Aunt    Kidney cancer Neg Hx    Bladder Cancer Neg Hx      Social History   Tobacco Use   Smoking status: Never   Smokeless tobacco: Never  Vaping Use   Vaping Use: Never used  Substance Use Topics   Alcohol use: No    Alcohol/week: 0.0 standard drinks   Drug use: No    Allergies as of 09/13/2021 - Review Complete 09/13/2021  Allergen Reaction Noted   Aspirin Other (See Comments) 08/08/2017   Lisinopril Cough 06/14/2019    Physical Examination:  Constitutional: General:   Alert,  Well-developed, well-nourished, pleasant and  cooperative in NAD BP (!) 152/88   Pulse 65   Temp 98.1 F (36.7 C) (Oral)   Wt 192 lb (87.1 kg)   LMP  (LMP Unknown)   BMI 30.99 kg/m   Respiratory: Normal respiratory effort  Gastrointestinal:  Soft, non-tender and non-distended without masses, hepatosplenomegaly or hernias noted.  No guarding or rebound tenderness.     Cardiac: No clubbing or edema.  No cyanosis. Normal posterior tibial pedal pulses noted.  Psych:  Alert and cooperative. Normal mood and affect.  Musculoskeletal:  Normal gait. Head normocephalic, atraumatic. Symmetrical without gross deformities. 5/5 Lower extremity strength bilaterally.  Skin: Warm. Intact without significant lesions or rashes. No jaundice.  Neck: Supple, trachea midline  Lymph: No cervical lymphadenopathy  Psych:  Alert and oriented x3, Alert and cooperative. Normal mood and affect.  Labs: CMP     Component Value Date/Time   NA 139 06/29/2021 0958   NA 141 10/15/2019 1205   NA 141 01/16/2014 0514   K 3.9  06/29/2021 0958   K 3.5 01/16/2014 0514   CL 105 06/29/2021 0958   CL 108 (H) 01/16/2014 0514   CO2 26 06/29/2021 0958   CO2 28 01/16/2014 0514   GLUCOSE 93 06/29/2021 0958   GLUCOSE 104 (H) 01/16/2014 0514   BUN 26 (H) 06/29/2021 0958   BUN 15 10/15/2019 1205   BUN 2 (L) 01/16/2014 0514   CREATININE 0.78 06/29/2021 0958   CALCIUM 9.5 06/29/2021 0958   CALCIUM 7.6 (L) 01/16/2014 0514   PROT 7.1 06/29/2021 0958   PROT 6.7 10/15/2019 1205   PROT 6.2 (L) 01/11/2014 1121   ALBUMIN 3.6 05/25/2021 0053   ALBUMIN 4.3 10/15/2019 1205   ALBUMIN 3.0 (L) 01/11/2014 1121   AST 26 06/29/2021 0958   AST 24 01/11/2014 1121   ALT 18 06/29/2021 0958   ALT 16 01/11/2014 1121   ALKPHOS 71 05/25/2021 0053   ALKPHOS 69 01/11/2014 1121   BILITOT 1.3 (H) 06/29/2021 0958   BILITOT 1.4 (H) 10/15/2019 1205   BILITOT 0.4 01/11/2014 1121   GFRNONAA >60 05/28/2021 0544   GFRNONAA 82 12/29/2020 0000   GFRAA 95 12/29/2020 0000   Lab Results  Component Value Date   WBC 4.5 06/29/2021   HGB 10.3 (L) 06/29/2021   HCT 31.0 (L) 06/29/2021   MCV 97.2 06/29/2021   PLT 256 06/29/2021    Imaging Studies:   Assessment and Plan:   Holly Green is a 75 y.o. y/o female with previous history of diverticulitis, and lower GI bleed from diverticulosis, here for follow-up  Patient has seen general surgery and at this time is choosing to continue with conservative management  Is continuing high-fiber diet Is taking MiraLAX and is having soft bowel movements daily  Combination of high-fiber diet and MiraLAX to maintain soft stool discussed  Patient advised to call us with any recurrence in symptoms or any concerns or questions  Follow-up in 6 to 12 months or earlier if needed    Dr Holly Green

## 2021-09-28 ENCOUNTER — Other Ambulatory Visit: Payer: Self-pay | Admitting: Oncology

## 2021-09-28 DIAGNOSIS — D509 Iron deficiency anemia, unspecified: Secondary | ICD-10-CM | POA: Insufficient documentation

## 2021-10-01 ENCOUNTER — Telehealth: Payer: Self-pay

## 2021-10-01 ENCOUNTER — Other Ambulatory Visit: Payer: Self-pay

## 2021-10-01 DIAGNOSIS — Z1231 Encounter for screening mammogram for malignant neoplasm of breast: Secondary | ICD-10-CM

## 2021-10-01 NOTE — Telephone Encounter (Signed)
Order has been placed, pt did not answer but left a vm letting her know order has been placed.

## 2021-10-01 NOTE — Telephone Encounter (Signed)
Copied from [place] [id number]. Topic: General - Inquiry >> Oct 01, 2021  2:22 PM Loma [place] wrote: Reason for CRM: Pt is requesting her annual Mammo , last in Aug 2021. Please call to advise, pt last seen August 22 CB # [phone] [place] [facility]

## 2021-10-05 ENCOUNTER — Other Ambulatory Visit: Payer: Self-pay

## 2021-10-05 ENCOUNTER — Inpatient Hospital Stay: Payer: Medicare Other | Attending: Oncology

## 2021-10-05 DIAGNOSIS — K5730 Diverticulosis of large intestine without perforation or abscess without bleeding: Secondary | ICD-10-CM | POA: Diagnosis not present

## 2021-10-05 DIAGNOSIS — Z803 Family history of malignant neoplasm of breast: Secondary | ICD-10-CM | POA: Insufficient documentation

## 2021-10-05 DIAGNOSIS — D500 Iron deficiency anemia secondary to blood loss (chronic): Secondary | ICD-10-CM | POA: Insufficient documentation

## 2021-10-05 DIAGNOSIS — Z8042 Family history of malignant neoplasm of prostate: Secondary | ICD-10-CM | POA: Insufficient documentation

## 2021-10-05 DIAGNOSIS — D509 Iron deficiency anemia, unspecified: Secondary | ICD-10-CM

## 2021-10-05 DIAGNOSIS — I10 Essential (primary) hypertension: Secondary | ICD-10-CM | POA: Diagnosis not present

## 2021-10-05 DIAGNOSIS — Z90710 Acquired absence of both cervix and uterus: Secondary | ICD-10-CM | POA: Diagnosis not present

## 2021-10-05 LAB — CBC WITH DIFFERENTIAL/PLATELET
Abs Immature Granulocytes: 0.01 10*3/uL (ref 0.00–0.07)
Basophils Absolute: 0 10*3/uL (ref 0.0–0.1)
Basophils Relative: 1 %
Eosinophils Absolute: 0.1 10*3/uL (ref 0.0–0.5)
Eosinophils Relative: 3 %
HCT: 34.7 % — ABNORMAL LOW (ref 36.0–46.0)
Hemoglobin: 11.8 g/dL — ABNORMAL LOW (ref 12.0–15.0)
Immature Granulocytes: 0 %
Lymphocytes Relative: 41 %
Lymphs Abs: 1.8 10*3/uL (ref 0.7–4.0)
MCH: 31.1 pg (ref 26.0–34.0)
MCHC: 34 g/dL (ref 30.0–36.0)
MCV: 91.6 fL (ref 80.0–100.0)
Monocytes Absolute: 0.4 10*3/uL (ref 0.1–1.0)
Monocytes Relative: 9 %
Neutro Abs: 2.1 10*3/uL (ref 1.7–7.7)
Neutrophils Relative %: 46 %
Platelets: 190 10*3/uL (ref 150–400)
RBC: 3.79 MIL/uL — ABNORMAL LOW (ref 3.87–5.11)
RDW: 13.9 % (ref 11.5–15.5)
WBC: 4.4 10*3/uL (ref 4.0–10.5)
nRBC: 0 % (ref 0.0–0.2)

## 2021-10-05 LAB — COMPREHENSIVE METABOLIC PANEL
ALT: 22 U/L (ref 0–44)
AST: 30 U/L (ref 15–41)
Albumin: 4 g/dL (ref 3.5–5.0)
Alkaline Phosphatase: 79 U/L (ref 38–126)
Anion gap: 8 (ref 5–15)
BUN: 20 mg/dL (ref 8–23)
CO2: 27 mmol/L (ref 22–32)
Calcium: 8.9 mg/dL (ref 8.9–10.3)
Chloride: 103 mmol/L (ref 98–111)
Creatinine, Ser: 0.59 mg/dL (ref 0.44–1.00)
GFR, Estimated: >60 mL/min (ref >60)
Glucose, Bld: 103 mg/dL — ABNORMAL HIGH (ref 70–99)
Potassium: 3.6 mmol/L (ref 3.5–5.1)
Sodium: 138 mmol/L (ref 135–145)
Total Bilirubin: 1.5 mg/dL — ABNORMAL HIGH (ref 0.3–1.2)
Total Protein: 7.3 g/dL (ref 6.5–8.1)

## 2021-10-05 LAB — IRON AND TIBC
Iron: 90 ug/dL (ref 28–170)
Saturation Ratios: 24 % (ref 10.4–31.8)
TIBC: 372 ug/dL (ref 250–450)
UIBC: 282 ug/dL

## 2021-10-05 LAB — FERRITIN: Ferritin: 52 ng/mL (ref 11–307)

## 2021-10-08 ENCOUNTER — Other Ambulatory Visit: Payer: Self-pay

## 2021-10-08 ENCOUNTER — Inpatient Hospital Stay (HOSPITAL_BASED_OUTPATIENT_CLINIC_OR_DEPARTMENT_OTHER): Payer: Medicare Other | Admitting: Oncology

## 2021-10-08 ENCOUNTER — Inpatient Hospital Stay: Payer: Medicare Other

## 2021-10-08 ENCOUNTER — Encounter: Payer: Self-pay | Admitting: Oncology

## 2021-10-08 VITALS — BP 166/91 | HR 74 | Temp 96.4°F | Wt 192.0 lb

## 2021-10-08 DIAGNOSIS — Z803 Family history of malignant neoplasm of breast: Secondary | ICD-10-CM | POA: Diagnosis not present

## 2021-10-08 DIAGNOSIS — D500 Iron deficiency anemia secondary to blood loss (chronic): Secondary | ICD-10-CM | POA: Diagnosis not present

## 2021-10-08 DIAGNOSIS — I10 Essential (primary) hypertension: Secondary | ICD-10-CM | POA: Diagnosis not present

## 2021-10-08 DIAGNOSIS — K5730 Diverticulosis of large intestine without perforation or abscess without bleeding: Secondary | ICD-10-CM

## 2021-10-08 DIAGNOSIS — Z90710 Acquired absence of both cervix and uterus: Secondary | ICD-10-CM | POA: Diagnosis not present

## 2021-10-08 DIAGNOSIS — M0609 Rheumatoid arthritis without rheumatoid factor, multiple sites: Secondary | ICD-10-CM | POA: Diagnosis not present

## 2021-10-08 DIAGNOSIS — D509 Iron deficiency anemia, unspecified: Secondary | ICD-10-CM

## 2021-10-13 ENCOUNTER — Encounter: Payer: Self-pay | Admitting: Oncology

## 2021-10-13 NOTE — Progress Notes (Signed)
Hematology/Oncology follow-up note [phone] [phone]   Patient Care Team: Delsa Grana, PA-C as PCP - General (Family Medicine) Virgel Manifold, MD as Consulting Physician (Gastroenterology) Earlie Server, MD as Consulting Physician (Hematology and Oncology) Quintin Alto, MD as Consulting Physician (Rheumatology)  REFERRING PROVIDER: Delsa Grana, PA-C CHIEF COMPLAINTS/REASON FOR VISIT:  Follow-up in iron deficiency anemia  HISTORY OF PRESENTING ILLNESS:  Holly Green is a  75 y.o.  female with PMH listed below was seen in consultation at the request of Delsa Grana, PA-C   for evaluation of iron deficiency anemia.  Patient has a remote history of diverticula bleeding status post positive bleeding scan and embolization 2009 and 2015. Patient was admitted from 12/29/2019-12/31/2019 due to GI bleeding.  Patient had a 2 g hemoglobin drop over the past 2 months prior to admission.  Hemoglobin was 10.  Iron panel showed iron deficiency with ferritin off 32 and iron saturation at 11.  Conservative measures was recommended by gastroenterology.  Patient was discharged. 02/22/2020, EGD showed small hiatal hernia, normal stomach and duodenum.  No specimen was collected. 11/09/2017, colonoscopy showed: Diverticulosis.  Otherwise normal. No recent blood work after February was done. Patient is currently taking oral iron supplementation  #  05/19/2021 for rectal bleeding and abdominal pain.  Imaging showed diverticulitis and she was treated with Augmentin and discharged home.  She was seen again on 05/25/2021 for same concern but noted to have significant drop in her hemoglobin. She was given IV Zosyn and 1 unit PRBC.  She was admitted and discharged on 05/28/2021  On methotrexate  INTERVAL HISTORY Holly Green is a 75 y.o. female who has above history reviewed by me today presents for follow up visit for management of anemia Patient reports feeling well.  No new  complaints.    Review of Systems  Constitutional:  Negative for appetite change, chills, fatigue and fever.  HENT:   Negative for hearing loss and voice change.   Eyes:  Negative for eye problems.  Respiratory:  Negative for chest tightness and cough.   Cardiovascular:  Negative for chest pain.  Gastrointestinal:  Negative for abdominal distention, abdominal pain and blood in stool.  Endocrine: Negative for hot flashes.  Genitourinary:  Negative for difficulty urinating and frequency.   Musculoskeletal:  Negative for arthralgias.  Skin:  Negative for itching and rash.  Neurological:  Negative for extremity weakness.  Hematological:  Negative for adenopathy.  Psychiatric/Behavioral:  Negative for confusion.    MEDICAL HISTORY:  Past Medical History:  Diagnosis Date   Abdominal aortic atherosclerosis (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, of knee, 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   COAD (chronic obstructive airways disease) ([place]) 08/03/2018   Diverticulitis 2015   Gastrointestinal hemorrhage 12/29/2019   GERD (gastroesophageal reflux disease)    GI bleeding 05/25/2021   History of GI bleed 11/06/2017   History of kidney stones    History of stomach ulcers    Hyperlipidemia    Hypertension    Left hip pain 10/15/2019   Osteoporosis    Pre-diabetes    Prediabetes 10/15/2015   Simple cyst of kidney 03/05/2017   CT scan April 2018   Spondylolisthesis at L4-L5 level 03/07/2017  SURGICAL HISTORY: Past Surgical History:  Procedure Laterality Date   ABDOMINAL HYSTERECTOMY     BACK SURGERY  2010   BREAST CYST ASPIRATION Right 1990's   COLONOSCOPY Left 11/09/2017   Procedure: COLONOSCOPY;  Surgeon: Virgel Manifold, MD;  Location: [facility]  ENDOSCOPY;  Service: Endoscopy;  Laterality: Left;   COLONOSCOPY WITH PROPOFOL N/A 04/14/2020   Procedure: COLONOSCOPY WITH PROPOFOL;  Surgeon: Virgel Manifold, MD;  Location: [facility] ENDOSCOPY;  Service: Endoscopy;  Laterality: N/A;   CYSTO WITH HYDRODISTENSION N/A 08/15/2017   Procedure: CYSTOSCOPY/HYDRODISTENSION;  Surgeon: Nickie Retort, MD;  Location: [facility] ORS;  Service: Urology;  Laterality: N/A;   ESOPHAGOGASTRODUODENOSCOPY Left 11/09/2017   Procedure: ESOPHAGOGASTRODUODENOSCOPY (EGD);  Surgeon: Virgel Manifold, MD;  Location: [facility] ENDOSCOPY;  Service: Endoscopy;  Laterality: Left;   ESOPHAGOGASTRODUODENOSCOPY (EGD) WITH PROPOFOL N/A 02/22/2020   Procedure: ESOPHAGOGASTRODUODENOSCOPY (EGD) WITH PROPOFOL;  Surgeon: Lucilla Lame, MD;  Location: [facility] ENDOSCOPY;  Service: Endoscopy;  Laterality: N/A;   FOOT SURGERY Right     SOCIAL HISTORY: Social History   Socioeconomic History   Marital status: Single    Spouse name: Not on file   Number of children: 1   Years of education: Not on file   Highest education level: High school graduate  Occupational History   Occupation: retired  Tobacco Use   Smoking status: Never   Smokeless tobacco: Never  Vaping Use   Vaping Use: Never used  Substance and Sexual Activity   Alcohol use: No    Alcohol/week: 0.0 standard drinks   Drug use: No   Sexual activity: Never  Other Topics Concern   Not on file  Social History Narrative   Pt's nephew lives with her   Social Determinants of Health   Financial Resource Strain: Low Risk    Difficulty of Paying Living Expenses: Not hard at all  Food Insecurity: No Food Insecurity   Worried About Charity fundraiser in the Last Year: Never true   Arboriculturist in the Last Year: Never true  Transportation Needs: No Transportation Needs   Lack of Transportation (Medical): No   Lack of Transportation (Non-Medical): No  Physical Activity: Insufficiently Active   Days of Exercise per  Week: 2 days   Minutes of Exercise per Session: 20 min  Stress: No Stress Concern Present   Feeling of Stress : Not at all  Social Connections: Moderately Isolated   Frequency of Communication with Friends and Family: More than three times a week   Frequency of Social Gatherings with Friends and Family: Once a week   Attends Religious Services: More than 4 times per year   Active Member of [employer] or Organizations: No   Attends Archivist Meetings: Never   Marital Status: Never married  Human resources officer Violence: Not At Risk   Fear of Current or Ex-Partner: No   Emotionally Abused: No   Physically Abused: No   Sexually Abused: No    FAMILY HISTORY: Family History  Problem Relation Age of Onset   Hypertension Brother    Heart disease Brother    Heart failure Brother    Hypertension Mother    Stroke Mother    Stroke Father    Prostate cancer Father    Aneurysm Sister    Breast cancer Sister 14   Liver disease Maternal Aunt    Heart failure Paternal Aunt    Heart disease Paternal Uncle    Heart attack Brother    Heart disease  Brother    Breast cancer Maternal Aunt    Heart disease Paternal Aunt    Kidney cancer Neg Hx    Bladder Cancer Neg Hx     ALLERGIES:  is allergic to aspirin and lisinopril.  MEDICATIONS:  Current Outpatient Medications  Medication Sig Dispense Refill   acetaminophen (TYLENOL) 650 MG CR tablet Take 650-1,300 mg by mouth every 8 (eight) hours as needed for pain.     bisoprolol-hydrochlorothiazide (ZIAC) 10-6.25 MG tablet Take 1 tablet by mouth daily. 90 tablet 3   Calcium Carb-Cholecalciferol (CALCIUM-VITAMIN D) 600-400 MG-UNIT TABS Take 1 tablet by mouth daily.     Certolizumab Pegol 2 X 200 MG/ML PSKT Inject 400 mg into the skin every 28 (twenty-eight) days.     ferrous sulfate (FERROUSUL) 325 (65 FE) MG tablet Take 1 tablet daily. 90 tablet 2   loratadine (CLARITIN) 10 MG tablet Take 1 tablet (10 mg total) by mouth daily. 30 tablet 11    losartan (COZAAR) 50 MG tablet Take 1 tablet (50 mg total) by mouth daily. 90 tablet 3   lovastatin (MEVACOR) 40 MG tablet Take 1 tablet (40 mg total) by mouth at bedtime. 90 tablet 3   methotrexate 2.5 MG tablet Take 10 mg by mouth once a week. Pt takes every sunday     Multiple Vitamin (MULTI-VITAMIN) tablet Take 1 tablet by mouth daily.     polyethylene glycol (MIRALAX) 17 g packet Take 17 g by mouth daily. 30 each 2   No current facility-administered medications for this visit.     PHYSICAL EXAMINATION: ECOG PERFORMANCE STATUS: 1 - Symptomatic but completely ambulatory Vitals:   10/08/21 1305  BP: (!) 166/91  Pulse: 74  Temp: (!) 96.4 F (35.8 C)   Filed Weights   10/08/21 1305  Weight: 192 lb (87.1 kg)    Physical Exam Constitutional:      General: She is not in acute distress. HENT:     Head: Normocephalic and atraumatic.  Eyes:     General: No scleral icterus. Cardiovascular:     Rate and Rhythm: Normal rate and regular rhythm.     Heart sounds: Normal heart sounds.  Pulmonary:     Effort: Pulmonary effort is normal. No respiratory distress.     Breath sounds: No wheezing.  Abdominal:     General: Bowel sounds are normal. There is no distension.     Palpations: Abdomen is soft.  Musculoskeletal:        General: No deformity. Normal range of motion.     Cervical back: Normal range of motion and neck supple.  Skin:    General: Skin is warm and dry.     Findings: No erythema or rash.  Neurological:     Mental Status: She is alert and oriented to person, place, and time. Mental status is at baseline.     Cranial Nerves: No cranial nerve deficit.     Coordination: Coordination normal.  Psychiatric:        Mood and Affect: Mood normal.      CMP Latest Ref Rng & Units 10/05/2021  Glucose 70 - 99 mg/dL 103(H)  BUN 8 - 23 mg/dL 20  Creatinine 0.44 - 1.00 mg/dL 0.59  Sodium 135 - 145 mmol/L 138  Potassium 3.5 - 5.1 mmol/L 3.6  Chloride 98 - 111 mmol/L 103   CO2 22 - 32 mmol/L 27  Calcium 8.9 - 10.3 mg/dL 8.9  Total Protein 6.5 - 8.1 g/dL 7.3  Total Bilirubin  0.3 - 1.2 mg/dL 1.5(H)  Alkaline Phos 38 - 126 U/L 79  AST 15 - 41 U/L 30  ALT 0 - 44 U/L 22   CBC Latest Ref Rng & Units 10/05/2021  WBC 4.0 - 10.5 K/uL 4.4  Hemoglobin 12.0 - 15.0 g/dL 11.8(L)  Hematocrit 36.0 - 46.0 % 34.7(L)  Platelets 150 - 400 K/uL 190     LABORATORY DATA:  I have reviewed the data as listed Lab Results  Component Value Date   WBC 4.4 10/05/2021   HGB 11.8 (L) 10/05/2021   HCT 34.7 (L) 10/05/2021   MCV 91.6 10/05/2021   PLT 190 10/05/2021   Recent Labs    12/29/20 0000 12/29/20 0000 05/19/21 1059 05/25/21 0053 05/25/21 0320 05/27/21 0618 05/28/21 0544 06/29/21 0958 10/05/21 1046  NA 142  --  136 139   < > 142 138 139 138  K 3.7  --  4.4 3.8   < > 3.6 3.7 3.9 3.6  CL 104  --  103 107   < > 106 104 105 103  CO2 26  --  26 25   < > 29 27 26 27   GLUCOSE 76  --  89 131*   < > 92 92 93 103*  BUN 17  --  17 29*   < > 11 18 26* 20  CREATININE 0.73   < > 0.61 0.88   < > 0.63 0.78 0.78 0.59  CALCIUM 9.3  --  8.9 9.0   < > 9.0 8.8* 9.5 8.9  GFRNONAA 82   < > >60 >60   < > >60 >60  --  >60  GFRAA 95  --   --   --   --   --   --   --   --   PROT 6.7  --  7.4 6.8  --   --   --  7.1 7.3  ALBUMIN  --   --  3.9 3.6  --   --   --   --  4.0  AST 23  --  41 41  --   --   --  26 30  ALT 14  --  21 37  --   --   --  18 22  ALKPHOS  --   --  69 71  --   --   --   --  79  BILITOT 1.9*  --  1.5* 0.8  --   --   --  1.3* 1.5*   < > = values in this interval not displayed.    Iron/TIBC/Ferritin/ %Sat    Component Value Date/Time   IRON 90 10/05/2021 1046   IRON 36 12/15/2017 1339   TIBC 372 10/05/2021 1046   TIBC 408 12/15/2017 1339   FERRITIN 52 10/05/2021 1046   FERRITIN 39 12/15/2017 1339   IRONPCTSAT 24 10/05/2021 1046   IRONPCTSAT 11 (L) 06/29/2021 0958     RADIOGRAPHIC STUDIES: I have personally reviewed the radiological images as listed and  agreed with the findings in the report. No results found.     ASSESSMENT & PLAN:  1. Iron deficiency anemia due to chronic blood loss   2. Diverticulosis of colon    #Iron deficiency anemia, Labs are reviewed and discussed with patient. Patient has stable iron store and hemoglobin has improved to 11.8. I will hold on IV Venofer treatments.   Diverticulosis, follow-up with gastroenterology.  High-fiber diet and MiraLAX. 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 The patient knows to call the clinic with any problems questions or concerns.  Cc Delsa Grana, PA-C  Return of visit: 6 months  10/13/2021

## 2021-11-05 DIAGNOSIS — M0609 Rheumatoid arthritis without rheumatoid factor, multiple sites: Secondary | ICD-10-CM | POA: Diagnosis not present

## 2021-12-10 DIAGNOSIS — M0609 Rheumatoid arthritis without rheumatoid factor, multiple sites: Secondary | ICD-10-CM | POA: Diagnosis not present

## 2021-12-11 ENCOUNTER — Ambulatory Visit
Admission: RE | Admit: 2021-12-11 | Discharge: 2021-12-11 | Disposition: A | Discharge status: Home / Self Care | Payer: Medicare Other | Source: Ambulatory Visit | Attending: Family Medicine | Admitting: Family Medicine

## 2021-12-11 ENCOUNTER — Other Ambulatory Visit: Payer: Self-pay

## 2021-12-11 DIAGNOSIS — Z1231 Encounter for screening mammogram for malignant neoplasm of breast: Secondary | ICD-10-CM | POA: Diagnosis not present

## 2022-01-04 ENCOUNTER — Encounter: Payer: Self-pay | Admitting: Internal Medicine

## 2022-01-04 ENCOUNTER — Ambulatory Visit (INDEPENDENT_AMBULATORY_CARE_PROVIDER_SITE_OTHER): Payer: Medicare Other | Admitting: Internal Medicine

## 2022-01-04 VITALS — BP 140/78 | HR 70 | Temp 97.7°F | Resp 16 | Ht 66.0 in | Wt 195.4 lb

## 2022-01-04 DIAGNOSIS — I10 Essential (primary) hypertension: Secondary | ICD-10-CM | POA: Diagnosis not present

## 2022-01-04 DIAGNOSIS — Z23 Encounter for immunization: Secondary | ICD-10-CM | POA: Diagnosis not present

## 2022-01-04 DIAGNOSIS — M0609 Rheumatoid arthritis without rheumatoid factor, multiple sites: Secondary | ICD-10-CM | POA: Diagnosis not present

## 2022-01-04 DIAGNOSIS — E669 Obesity, unspecified: Secondary | ICD-10-CM | POA: Diagnosis not present

## 2022-01-04 DIAGNOSIS — R7303 Prediabetes: Secondary | ICD-10-CM | POA: Diagnosis not present

## 2022-01-04 DIAGNOSIS — E782 Mixed hyperlipidemia: Secondary | ICD-10-CM | POA: Diagnosis not present

## 2022-01-04 DIAGNOSIS — K219 Gastro-esophageal reflux disease without esophagitis: Secondary | ICD-10-CM | POA: Diagnosis not present

## 2022-01-04 NOTE — Progress Notes (Signed)
Established Patient Office Visit  Subjective:  Patient ID: Holly Green, female    DOB: Jan 03, 1946  Age: 76 y.o. MRN: [id number]  CC:  Chief Complaint  Patient presents with   Follow-up   Hypertension   Hyperlipidemia   Gastroesophageal Reflux   PreDM    HPI Holly Green presents for follow up on chronic medical conditions.   Hypertension: -Medications: Bisoprolol-HCTZ 10-6.25, Losartan 50 -Patient is compliant with above medications and reports no side effects. -Checking BP at home (average): 189/95 highest without medicine, average about 140 with meds -Denies any SOB, CP, vision changes, LE edema or symptoms of hypotension  HLD: -Medications: Lovastatin 40 -Patient is compliant with above medications and reports no side effects.  -Last lipid panel: 8/22 Lipid Panel     Component Value Date/Time   CHOL 161 06/29/2021 0958   CHOL 156 10/15/2019 1205   TRIG 40 06/29/2021 0958   HDL 81 06/29/2021 0958   HDL 79 10/15/2019 1205   CHOLHDL 2.0 06/29/2021 0958   VLDL 8 06/06/2017 0938   LDLCALC 69 06/29/2021 0958   LABVLDL 10 10/15/2019 1205   Pre-Diabetes: -Last A1c 5.0 8/22 -Not currently on any medications   GERD: -Not currently on meds, indigestion rarely   RA: -Currently on Methotrexate, Certolizumab injections as well  -Complaining of right wrist pain today -Following with Rheumatology next Tuesday  Health Maintenance: -Blood work up to date -Mammogram 1/23 [place] 1 -Colonoscopy 5/21: negative    Past Medical History:  Diagnosis Date   Abdominal aortic atherosclerosis ([place]) 03/05/2017   CT scan April 2018   Acute cystitis with hematuria 09/29/2020   Arthritis    Bleeding disorder ([place])    Cardiac murmur 05/07/2016   Chondrocalcinosis 07/17/2018   Last Assessment & Plan:  Mild lateral compartment OA chondrocalcinosis   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, of knee, 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   COAD (chronic obstructive airways disease) ([place])  08/03/2018   Diverticulitis 2015   Gastrointestinal hemorrhage 12/29/2019   GERD (gastroesophageal reflux disease)    GI bleeding 05/25/2021   History of GI bleed 11/06/2017   History of kidney stones    History of stomach ulcers    Hyperlipidemia    Hypertension    Left hip pain 10/15/2019   Osteoporosis    Pre-diabetes    Prediabetes 10/15/2015   Simple cyst of kidney 03/05/2017   CT scan April 2018   Spondylolisthesis at L4-L5 level 03/07/2017    Past Surgical History:  Procedure Laterality Date   ABDOMINAL HYSTERECTOMY     BACK SURGERY  2010   BREAST CYST ASPIRATION Right 1990's   COLONOSCOPY Left 11/09/2017   Procedure: COLONOSCOPY;  Surgeon: Virgel Manifold, MD;  Location: [facility] ENDOSCOPY;  Service: Endoscopy;  Laterality: Left;   COLONOSCOPY WITH PROPOFOL N/A 04/14/2020   Procedure: COLONOSCOPY WITH PROPOFOL;  Surgeon: Virgel Manifold, MD;  Location: [facility] ENDOSCOPY;  Service: Endoscopy;  Laterality: N/A;   CYSTO WITH HYDRODISTENSION N/A 08/15/2017   Procedure: CYSTOSCOPY/HYDRODISTENSION;  Surgeon: Nickie Retort, MD;  Location: [facility] ORS;  Service: Urology;  Laterality: N/A;   ESOPHAGOGASTRODUODENOSCOPY Left 11/09/2017   Procedure: ESOPHAGOGASTRODUODENOSCOPY (EGD);  Surgeon: Virgel Manifold, MD;  Location: [facility] ENDOSCOPY;  Service: Endoscopy;  Laterality: Left;   ESOPHAGOGASTRODUODENOSCOPY (EGD) WITH PROPOFOL N/A 02/22/2020   Procedure: ESOPHAGOGASTRODUODENOSCOPY (EGD) WITH PROPOFOL;  Surgeon: Lucilla Lame, MD;  Location: [facility] ENDOSCOPY;  Service: Endoscopy;  Laterality: N/A;   FOOT SURGERY Right     Family History  Problem  Relation Age of Onset   Hypertension Brother    Heart disease Brother    Heart failure Brother    Hypertension Mother    Stroke Mother    Stroke Father    Prostate cancer Father    Aneurysm Sister    Breast cancer Sister 24   Liver disease Maternal Aunt    Heart failure Paternal Aunt    Heart disease Paternal Uncle    Heart attack  Brother    Heart disease Brother    Breast cancer Maternal Aunt    Heart disease Paternal Aunt    Kidney cancer Neg Hx    Bladder Cancer Neg Hx     Social History   Socioeconomic History   Marital status: Single    Spouse name: Not on file   Number of children: 1   Years of education: Not on file   Highest education level: High school graduate  Occupational History   Occupation: retired  Tobacco Use   Smoking status: Never   Smokeless tobacco: Never  Vaping Use   Vaping Use: Never used  Substance and Sexual Activity   Alcohol use: No    Alcohol/week: 0.0 standard drinks   Drug use: No   Sexual activity: Never  Other Topics Concern   Not on file  Social History Narrative   Pt's nephew lives with her   Social Determinants of Health   Financial Resource Strain: Low Risk    Difficulty of Paying Living Expenses: Not hard at all  Food Insecurity: No Food Insecurity   Worried About Charity fundraiser in the Last Year: Never true   Arboriculturist in the Last Year: Never true  Transportation Needs: No Transportation Needs   Lack of Transportation (Medical): No   Lack of Transportation (Non-Medical): No  Physical Activity: Insufficiently Active   Days of Exercise per Week: 2 days   Minutes of Exercise per Session: 20 min  Stress: No Stress Concern Present   Feeling of Stress : Not at all  Social Connections: Moderately Isolated   Frequency of Communication with Friends and Family: More than three times a week   Frequency of Social Gatherings with Friends and Family: Once a week   Attends Religious Services: More than 4 times per year   Active Member of [employer] or Organizations: No   Attends Archivist Meetings: Never   Marital Status: Never married  Human resources officer Violence: Not At Risk   Fear of Current or Ex-Partner: No   Emotionally Abused: No   Physically Abused: No   Sexually Abused: No    Outpatient Medications Prior to Visit  Medication Sig  Dispense Refill   acetaminophen (TYLENOL) 650 MG CR tablet Take 650-1,300 mg by mouth every 8 (eight) hours as needed for pain.     bisoprolol-hydrochlorothiazide (ZIAC) 10-6.25 MG tablet Take 1 tablet by mouth daily. 90 tablet 3   Calcium Carb-Cholecalciferol (CALCIUM-VITAMIN D) 600-400 MG-UNIT TABS Take 1 tablet by mouth daily.     Certolizumab Pegol 2 X 200 MG/ML PSKT Inject 400 mg into the skin every 28 (twenty-eight) days.     ferrous sulfate (FERROUSUL) 325 (65 FE) MG tablet Take 1 tablet daily. 90 tablet 2   loratadine (CLARITIN) 10 MG tablet Take 1 tablet (10 mg total) by mouth daily. 30 tablet 11   losartan (COZAAR) 50 MG tablet Take 1 tablet (50 mg total) by mouth daily. 90 tablet 3   lovastatin (MEVACOR) 40 MG tablet  Take 1 tablet (40 mg total) by mouth at bedtime. 90 tablet 3   methotrexate 2.5 MG tablet Take 10 mg by mouth once a week. Pt takes every sunday     Multiple Vitamin (MULTI-VITAMIN) tablet Take 1 tablet by mouth daily.     polyethylene glycol (MIRALAX) 17 g packet Take 17 g by mouth daily. 30 each 2   No facility-administered medications prior to visit.    Allergies  Allergen Reactions   Aspirin Other (See Comments)    Diverticulosis   Lisinopril Cough    ROS Review of Systems  Constitutional:  Negative for chills and fever.  Eyes:  Negative for visual disturbance.  Respiratory:  Negative for cough and shortness of breath.   Cardiovascular:  Negative for chest pain.  Neurological:  Negative for dizziness and headaches.     Objective:    Physical Exam Constitutional:      Appearance: Normal appearance.  HENT:     Head: Normocephalic and atraumatic.  Eyes:     Conjunctiva/sclera: Conjunctivae normal.  Cardiovascular:     Rate and Rhythm: Normal rate and regular rhythm.  Pulmonary:     Effort: Pulmonary effort is normal.     Breath sounds: Normal breath sounds.  Musculoskeletal:     Right lower leg: No edema.     Left lower leg: No edema.  Skin:     General: Skin is warm and dry.  Neurological:     General: No focal deficit present.     Mental Status: She is alert. Mental status is at baseline.  Psychiatric:        Mood and Affect: Mood normal.        Behavior: Behavior normal.    BP 140/78    Pulse 70    Temp 97.7 F (36.5 C) (Oral)    Resp 16    Ht _0  (1.676 m)    Wt 195 lb 6.4 oz (88.6 kg)    LMP  (LMP Unknown)    SpO2 97%    BMI 31.54 kg/m  Wt Readings from Last 3 Encounters:  10/08/21 192 lb (87.1 kg)  09/13/21 192 lb (87.1 kg)  07/05/21 189 lb (85.7 kg)     Health Maintenance Due  Topic Date Due   Zoster Vaccines- Shingrix (1 of 2) Never done   Pneumonia Vaccine 46+ Years old (2 - PPSV23 if available, else PCV20) 08/07/2019   COVID-19 Vaccine (4 - Booster for Pfizer series) 11/06/2020   INFLUENZA VACCINE  06/25/2021    There are no preventive care reminders to display for this patient.  Lab Results  Component Value Date   TSH 1.770 10/12/2015   Lab Results  Component Value Date   WBC 4.4 10/05/2021   HGB 11.8 (L) 10/05/2021   HCT 34.7 (L) 10/05/2021   MCV 91.6 10/05/2021   PLT 190 10/05/2021   Lab Results  Component Value Date   NA 138 10/05/2021   K 3.6 10/05/2021   CO2 27 10/05/2021   GLUCOSE 103 (H) 10/05/2021   BUN 20 10/05/2021   CREATININE 0.59 10/05/2021   BILITOT 1.5 (H) 10/05/2021   ALKPHOS 79 10/05/2021   AST 30 10/05/2021   ALT 22 10/05/2021   PROT 7.3 10/05/2021   ALBUMIN 4.0 10/05/2021   CALCIUM 8.9 10/05/2021   ANIONGAP 8 10/05/2021   EGFR 80 06/29/2021   Lab Results  Component Value Date   CHOL 161 06/29/2021   Lab Results  Component Value Date  HDL 81 06/29/2021   Lab Results  Component Value Date   LDLCALC 69 06/29/2021   Lab Results  Component Value Date   TRIG 40 06/29/2021   Lab Results  Component Value Date   CHOLHDL 2.0 06/29/2021   Lab Results  Component Value Date   HGBA1C 5.0 06/29/2021      Assessment & Plan:   Problem List Items  Addressed This Visit       Cardiovascular and Mediastinum   Essential hypertension (Chronic)    Stable, continue current medications.        Digestive   GERD (gastroesophageal reflux disease) (Chronic)    Stable, avoid triggering foods.        Musculoskeletal and Integument   Rheumatoid arthritis of multiple sites with negative rheumatoid factor (HCC)    On methotrexate and another injection, following with Rheumatology.        Other   Hyperlipidemia (Chronic)    Stable, continue statin, recheck labs at follow up in 6 months.      Obesity (BMI 30.0-34.9) (Chronic)    Trying to monitor diet.       Prediabetes    Stable, last A1c 5.0% without medication.      Other Visit Diagnoses     Need for influenza vaccination    -  Primary   Relevant Orders   Flu Vaccine QUAD High Dose(Fluad) (Completed)       Follow-up: Return in about 6 months (around 07/04/2022).    Teodora Medici, DO

## 2022-01-04 NOTE — Assessment & Plan Note (Signed)
Stable, avoid triggering foods.

## 2022-01-04 NOTE — Patient Instructions (Addendum)
It was great seeing you today!  Plan discussed at today's visit: -Continue current medications -Flu vaccine given today, consider new pneumonia vaccine  Follow up in: 6 months  Take care and let us know if you have any questions or concerns prior to your next visit.  Dr. Rosana Berger   Pneumococcal Conjugate Vaccine (Prevnar 20) Suspension for Injection What is this medication? PNEUMOCOCCAL VACCINE (NEU mo KOK al vak SEEN) is a vaccine. It prevents pneumococcus bacterial infections. These bacteria can cause serious infections like pneumonia, meningitis, and blood infections. This vaccine will not treat an infection and will not cause infection. This vaccine is recommended for adults 18 years and older. This medicine may be used for other purposes; ask your health care provider or pharmacist if you have questions. COMMON BRAND NAME(S): Prevnar 20 What should I tell my care team before I take this medication? They need to know if you have any of these conditions: bleeding disorder fever immune system problems an unusual or allergic reaction to pneumococcal vaccine, diphtheria toxoid, other vaccines, other medicines, foods, dyes, or preservatives pregnant or trying to get pregnant breast-feeding How should I use this medication? This vaccine is injected into a muscle. It is given by a health care provider. A copy of Vaccine Information Statements will be given before each vaccination. Be sure to read this information carefully each time. This sheet may change often. Talk to your health care provider about the use of this medicine in children. Special care may be needed. Overdosage: If you think you have taken too much of this medicine contact a poison control center or emergency room at once. NOTE: This medicine is only for you. Do not share this medicine with others. What if I miss a dose? This does not apply. This medicine is not for regular use. What may interact with this  medication? medicines for cancer chemotherapy medicines that suppress your immune function steroid medicines like prednisone or cortisone This list may not describe all possible interactions. Give your health care provider a list of all the medicines, herbs, non-prescription drugs, or dietary supplements you use. Also tell them if you smoke, drink alcohol, or use illegal drugs. Some items may interact with your medicine. What should I watch for while using this medication? Mild fever and pain should go away in 3 days or less. Report any unusual symptoms to your health care provider. What side effects may I notice from receiving this medication? Side effects that you should report to your doctor or health care professional as soon as possible: allergic reactions (skin rash, itching or hives; swelling of the face, lips, or tongue) confusion fast, irregular heartbeat fever over 102 degrees F muscle weakness seizures trouble breathing unusual bruising or bleeding Side effects that usually do not require medical attention (report to your doctor or health care professional if they continue or are bothersome): fever of 102 degrees F or less headache joint pain muscle cramps, pain pain, tender at site where injected This list may not describe all possible side effects. Call your doctor for medical advice about side effects. You may report side effects to FDA at 1-800-FDA-1088. Where should I keep my medication? This vaccine is only given by a health care provider. It will not be stored at home. NOTE: This sheet is a summary. It may not cover all possible information. If you have questions about this medicine, talk to your doctor, pharmacist, or health care provider.  2022 Elsevier/Gold Standard (2020-07-27 00:00:00)

## 2022-01-04 NOTE — Assessment & Plan Note (Signed)
Stable, last A1c 5.0% without medication.

## 2022-01-04 NOTE — Assessment & Plan Note (Signed)
Trying to monitor diet.

## 2022-01-04 NOTE — Assessment & Plan Note (Signed)
Stable, continue statin, recheck labs at follow up in 6 months.

## 2022-01-04 NOTE — Assessment & Plan Note (Signed)
On methotrexate and another injection, following with Rheumatology.

## 2022-01-04 NOTE — Assessment & Plan Note (Signed)
Stable, continue current medications.  

## 2022-01-08 DIAGNOSIS — M170 Bilateral primary osteoarthritis of knee: Secondary | ICD-10-CM | POA: Diagnosis not present

## 2022-01-08 DIAGNOSIS — Z7960 Long term (current) use of unspecified immunomodulators and immunosuppressants: Secondary | ICD-10-CM | POA: Diagnosis not present

## 2022-01-08 DIAGNOSIS — M654 Radial styloid tenosynovitis [de Quervain]: Secondary | ICD-10-CM | POA: Diagnosis not present

## 2022-01-08 DIAGNOSIS — M0609 Rheumatoid arthritis without rheumatoid factor, multiple sites: Secondary | ICD-10-CM | POA: Diagnosis not present

## 2022-02-05 DIAGNOSIS — M0609 Rheumatoid arthritis without rheumatoid factor, multiple sites: Secondary | ICD-10-CM | POA: Diagnosis not present

## 2022-02-12 ENCOUNTER — Ambulatory Visit: Payer: Medicare Other

## 2022-02-26 ENCOUNTER — Ambulatory Visit (INDEPENDENT_AMBULATORY_CARE_PROVIDER_SITE_OTHER): Payer: Medicare Other

## 2022-02-26 ENCOUNTER — Ambulatory Visit (INDEPENDENT_AMBULATORY_CARE_PROVIDER_SITE_OTHER): Payer: Medicare Other | Admitting: Family Medicine

## 2022-02-26 VITALS — BP 142/80 | HR 78 | Temp 98.0°F | Resp 16 | Ht 66.0 in | Wt 194.8 lb

## 2022-02-26 VITALS — BP 142/80 | HR 78 | Temp 98.0°F | Wt 194.8 lb

## 2022-02-26 DIAGNOSIS — Z0000 Encounter for general adult medical examination without abnormal findings: Secondary | ICD-10-CM

## 2022-02-26 DIAGNOSIS — B354 Tinea corporis: Secondary | ICD-10-CM

## 2022-02-26 MED ORDER — CLOTRIMAZOLE 1 % EX CREA: 1.0000 "application " | TOPICAL_CREAM | Freq: Two times a day (BID) | CUTANEOUS | 0 refills | Status: AC

## 2022-02-26 NOTE — Progress Notes (Signed)
? ? ?  SUBJECTIVE:  ? ?CHIEF COMPLAINT / HPI:  ? ?RASH ?Duration:  months  ?Location: b/l ankles  ?Itching: yes ?Burning: yes ?Oozing: no ?Scaling:  a little ?Blisters: no ?Painful: yes ?Fevers: no ?Change in detergents/soaps/personal care products: no ?Recent illness: no ?Recent travel:no ?History of same: no ?Context: progressively worse ?Alleviating factors: benadryl helps some ?Treatments attempted: benadryl itch cream, vaseline ?Shortness of breath: no  ?Throat/tongue swelling: no ?Myalgias/arthralgias: no ? ? ?OBJECTIVE:  ? ?BP (!) 142/80   Pulse 78   Temp 98 ?F (36.7 ?C)   Wt 194 lb 12.8 oz (88.4 kg)   LMP  (LMP Unknown)   SpO2 97%   BMI 31.44 kg/m?   ?Gen: well appearing, in NAD ?Skin: circular areas of scale with hyperpigmented center to bilateral ankles with occasional patches to dorsum of feet ?Ext: WWP, no edema ? ? ?ASSESSMENT/PLAN:  ? ?Tinea corporis ?Rx clotrimazole. F/u in 2 weeks if no better.  ? ? ?Myles Gip, DO ?

## 2022-02-26 NOTE — Progress Notes (Signed)
? ?Subjective:  ? Holly Green is a 76 y.o. female who presents for Medicare Annual (Subsequent) preventive examination. ? ?Review of Systems    ? ?Cardiac Risk Factors include: advanced age (>56mn, >>67women);dyslipidemia;hypertension ? ?   ?Objective:  ?  ?Today's Vitals  ? 02/26/22 1456  ?BP: (!) 142/80  ?Pulse: 78  ?Resp: 16  ?Temp: 98 ?F (36.7 ?C)  ?TempSrc: Oral  ?SpO2: 97%  ?Weight: 194 lb 12.8 oz (88.4 kg)  ?Height: '5\' 6"'$  (1.676 m)  ? ?Body mass index is 31.44 kg/m?. ? ? ?  02/26/2022  ?  3:11 PM 07/05/2021  ?  9:29 AM 05/25/2021  ?  2:30 PM 02/08/2021  ? 10:25 AM 01/05/2021  ? 10:23 AM 09/29/2020  ? 10:11 AM 04/14/2020  ?  9:58 AM  ?Advanced Directives  ?Does Patient Have a Medical Advance Directive? No Yes No Yes No Yes Yes  ?Type of ACorporate treasurerof ASardisLiving will  Living will;Healthcare Power of AGastonLiving will  ?Copy of HC[place]in Chart?    No - copy requested  No - copy requested No - copy requested  ?Would patient like information on creating a medical advance directive? Yes (MAU/Ambulatory/Procedural Areas - Information given)  No - Patient declined  No - Patient declined    ? ? ?Current Medications (verified) ?Outpatient Encounter Medications as of 02/26/2022  ?Medication Sig  ? acetaminophen (TYLENOL) 650 MG CR tablet Take 650-1,300 mg by mouth every 8 (eight) hours as needed for pain.  ? bisoprolol-hydrochlorothiazide (ZIAC) 10-6.25 MG tablet Take 1 tablet by mouth daily.  ? Calcium Carbonate (CALCIUM 600 PO) Take 1,200 mg by mouth daily.  ? Certolizumab Pegol 2 X 200 MG/ML PSKT Inject 400 mg into the skin every 28 (twenty-eight) days.  ? Cholecalciferol (VITAMIN D) 50 MCG (2000 UT) CAPS Take by mouth.  ? ferrous sulfate (FERROUSUL) 325 (65 FE) MG tablet Take 1 tablet daily.  ? folic acid (FOLVITE) 1 MG tablet Take 1 tablet by mouth daily.  ? loratadine (CLARITIN) 10 MG tablet Take 1 tablet  (10 mg total) by mouth daily.  ? losartan (COZAAR) 50 MG tablet Take 1 tablet (50 mg total) by mouth daily.  ? lovastatin (MEVACOR) 40 MG tablet Take 1 tablet (40 mg total) by mouth at bedtime.  ? methotrexate 2.5 MG tablet Take 10 mg by mouth once a week. Pt takes every sunday  ? polyethylene glycol (MIRALAX) 17 g packet Take 17 g by mouth daily.  ? [DISCONTINUED] Calcium Carb-Cholecalciferol (CALCIUM-VITAMIN D) 600-400 MG-UNIT TABS Take 1 tablet by mouth daily.  ? ?No facility-administered encounter medications on file as of 02/26/2022.  ? ? ?Allergies (verified) ?Aspirin and Lisinopril  ? ?History: ?Past Medical History:  ?Diagnosis Date  ? [place]  ? CT scan April 2018  ? Acute cystitis with hematuria 09/29/2020  ? Arthritis   ? [place]   ? Cardiac murmur 05/07/2016  ? Chondrocalcinosis 07/17/2018  ? Last Assessment & Plan:  Mild lateral compartment OA chondrocalcinosis  ? 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 COAD (chronic obstructive airways disease) ([place] 08/03/2018  ? Diverticulitis 2015  ? Gastrointestinal hemorrhage 12/29/2019  ? GERD (gastroesophageal reflux disease)   ? GI bleeding 05/25/2021  ? History of GI bleed 11/06/2017  ? History of kidney stones   ? History of stomach ulcers   ?  Hyperlipidemia   ? Hypertension   ? Left hip pain 10/15/2019  ? Osteoporosis   ? Pre-diabetes   ? Prediabetes 10/15/2015  ? Simple cyst of kidney 03/05/2017  ? CT scan April 2018  ? Spondylolisthesis at L4-L5 level 03/07/2017  ? ?Past Surgical History:  ?Procedure Laterality Date  ? ABDOMINAL HYSTERECTOMY    ? BACK SURGERY  2010  ? BREAST CYST ASPIRATION Right 1990's  ? COLONOSCOPY Left 11/09/2017  ? Procedure: COLONOSCOPY;  Surgeon: Virgel Manifold, MD;  Location[facility] END[facility]COPY;  Service: Endoscopy;  Laterality: Left;  ? COLONOSCOPY WITH PROPOFOL N/A 04/14/2020  ? Procedure: COLONOSCOPY WITH PROPOFOL;  Surgeon: Virgel Manifold,  MD;  Location: [facility] ENDOSCOPY;  Service: Endoscopy;  Laterality: N/A;  ? CYSTO WITH HYDRODISTENSION N/A 08/15/2017  ? Procedure: CYSTOSCOPY/HYDRODISTENSION;  Surgeon: Nickie Retort, MD;  Location: [facility] ORS;  Service: Urology;  Laterality: N/A;  ? ESOPHAGOGASTRODUODENOSCOPY Left 11/09/2017  ? Procedure: ESOPHAGOGASTRODUODENOSCOPY (EGD);  Surgeon: Virgel Manifold, MD;  Location: [facility] ENDOSCOPY;  Service: Endoscopy;  Laterality: Left;  ? ESOPHAGOGASTRODUODENOSCOPY (EGD) WITH PROPOFOL N/A 02/22/2020  ? Procedure: ESOPHAGOGASTRODUODENOSCOPY (EGD) WITH PROPOFOL;  Surgeon: Lucilla Lame, MD;  Location[facility] ENDOSCOPY;  Service: Endoscopy;  Laterality: N/A;  ? FOOT SURGERY Right   ? ?Family History  ?Problem Relation Age of Onset  ? Hypertension Brother   ? Heart disease Brother   ? Heart failure Brother   ? Hypertension Mother   ? Stroke Mother   ? Stroke Father   ? Prostate cancer Father   ? Aneurysm Sister   ? Breast cancer Sister 53  ? Liver disease Maternal Aunt   ? Heart failure Paternal Aunt   ? Heart disease Paternal Uncle   ? Heart attack Brother   ? Heart disease Brother   ? Breast cancer Maternal Aunt   ? Heart disease Paternal Aunt   ? Kidney cancer Neg Hx   ? Bladder Cancer Neg Hx   ? ?Social History  ? ?Socioeconomic History  ? Marital status: Single  ?  Spouse name: Not on file  ? Number of children: 1  ? Years of education: Not on file  ? Highest education level: High school graduate  ?Occupational History  ? Occupation: retired  ?Tobacco Use  ? Smoking status: Never  ? Smokeless tobacco: Never  ?Vaping Use  ? Vaping Use: Never used  ?Substance and Sexual Activity  ? Alcohol use: No  ?  Alcohol/week: 0.0 standard drinks  ? Drug use: No  ? Sexual activity: Never  ?Other Topics Concern  ? Not on file  ?Social History Narrative  ? Pt's nephew lives with her  ? Works part time at [employer]  ? ?Social Determinants of Health  ? ?Financial Resource Strain: Low Risk   ? Difficulty of Paying Living Expenses:  Not hard at all  ?Food Insecurity: No Food Insecurity  ? Worried About Charity fundraiser in the Last Year: Never true  ? Ran Out of Food in the Last Year: Never true  ?Transportation Needs: No Transportation Needs  ? Lack of Transportation (Medical): No  ? Lack of Transportation (Non-Medical): No  ?Physical Activity: Inactive  ? Days of Exercise per Week: 0 days  ? Minutes of Exercise per Session: 0 min  ?Stress: No Stress Concern Present  ? Feeling of Stress : Not at all  ?Social Connections: Moderately Isolated  ? Frequency of Communication with Friends and Family: More than three times a week  ?  Frequency of Social Gatherings with Friends and Family: Once a week  ? Attends Religious Services: More than 4 times per year  ? Active Member of Clubs or Organizations: No  ? Attends Archivist Meetings: Never  ? Marital Status: Never married  ? ? ?Tobacco Counseling ?Counseling given: Not Answered ? ? ?Clinical Intake: ? ?Pre-visit preparation completed: Yes ? ?Pain : No/denies pain ? ?  ? ?BMI - recorded: 31.44 ?Nutritional Status: BMI > 30  Obese ?Nutritional Risks: None ?Diabetes: No ? ?How often do you need to have someone help you when you read instructions, pamphlets, or other written materials from your doctor or pharmacy?: 1 - Never ? ? ? ?Interpreter Needed?: No ? ?Information entered by :: Clemetine Marker LPN ? ? ?Activities of Daily Living ? ?  02/26/2022  ?  3:13 PM 01/04/2022  ? 11:28 AM  ?In your present state of health, do you have any difficulty performing the following activities:  ?Hearing? 0 1  ?Vision? 1 0  ?Difficulty concentrating or making decisions? 0 0  ?Walking or climbing stairs? 1 0  ?Dressing or bathing? 0 0  ?Doing errands, shopping? 0 0  ?Preparing Food and eating ? N   ?Using the Toilet? N   ?In the past six months, have you accidently leaked urine? N   ?Do you have problems with loss of bowel control? N   ?Managing your Medications? N   ?Managing your Finances? N   ?Housekeeping or  managing your Housekeeping? N   ? ? ?Patient Care Team: ?Delsa Grana, PA-C as PCP - General (Family Medicine) ?Earlie Server, MD as Consulting Physician (Hematology and Oncology) ?Quintin Alto, MD as Erin Fulling

## 2022-02-26 NOTE — Patient Instructions (Signed)
Holly Green , ?Thank you for taking time to come for your Medicare Wellness Visit. I appreciate your ongoing commitment to your health goals. Please review the following plan we discussed and let me know if I can assist you in the future.  ? ?Screening recommendations/referrals: ?Colonoscopy: done 04/14/20. Repeat 03/2025 ?Mammogram: done 12/11/21 ?Bone Density: done 08/21/20 ?Recommended yearly ophthalmology/optometry visit for glaucoma screening and checkup ?Recommended yearly dental visit for hygiene and checkup ? ?Vaccinations: ?Influenza vaccine: done 01/04/22 ?Pneumococcal vaccine: done 08/06/18 ?Tdap vaccine: done 06/15/12 ?Shingles vaccine: Shingrix discussed. Please contact your pharmacy for coverage information.  ?Covid-19:done 01/19/20, 02/09/20 & 09/11/20 ? ?Advanced directives: Advance directive discussed with you today. I have provided a copy for you to complete at home and have notarized. Once this is complete please bring a copy in to our office so we can scan it into your chart.  ? ?Conditions/risks identified: Recommend increasing physical activity as tolerated ? ?Next appointment: Follow up in one year for your annual wellness visit  ? ? ?Preventive Care 50 Years and Older, Female ?Preventive care refers to lifestyle choices and visits with your health care provider that can promote health and wellness. ?What does preventive care include? ?A yearly physical exam. This is also called an annual well check. ?Dental exams once or twice a year. ?Routine eye exams. Ask your health care provider how often you should have your eyes checked. ?Personal lifestyle choices, including: ?Daily care of your teeth and gums. ?Regular physical activity. ?Eating a healthy diet. ?Avoiding tobacco and drug use. ?Limiting alcohol use. ?Practicing safe sex. ?Taking low-dose aspirin every day. ?Taking vitamin and mineral supplements as recommended by your health care provider. ?What happens during an annual well check? ?The services  and screenings done by your health care provider during your annual well check will depend on your age, overall health, lifestyle risk factors, and family history of disease. ?Counseling  ?Your health care provider may ask you questions about your: ?Alcohol use. ?Tobacco use. ?Drug use. ?Emotional well-being. ?Home and relationship well-being. ?Sexual activity. ?Eating habits. ?History of falls. ?Memory and ability to understand (cognition). ?Work and work Statistician. ?Reproductive health. ?Screening  ?You may have the following tests or measurements: ?Height, weight, and BMI. ?Blood pressure. ?Lipid and cholesterol levels. These may be checked every 5 years, or more frequently if you are over 77 years old. ?Skin check. ?Lung cancer screening. You may have this screening every year starting at age 36 if you have a 30-pack-year history of smoking and currently smoke or have quit within the past 15 years. ?Fecal occult blood test (FOBT) of the stool. You may have this test every year starting at age 32. ?Flexible sigmoidoscopy or colonoscopy. You may have a sigmoidoscopy every 5 years or a colonoscopy every 10 years starting at age 37. ?Hepatitis C blood test. ?Hepatitis B blood test. ?Sexually transmitted disease (STD) testing. ?Diabetes screening. This is done by checking your blood sugar (glucose) after you have not eaten for a while (fasting). You may have this done every 1-3 years. ?Bone density scan. This is done to screen for osteoporosis. You may have this done starting at age 67. ?Mammogram. This may be done every 1-2 years. Talk to your health care provider about how often you should have regular mammograms. ?Talk with your health care provider about your test results, treatment options, and if necessary, the need for more tests. ?Vaccines  ?Your health care provider may recommend certain vaccines, such as: ?Influenza vaccine. This is  recommended every year. ?Tetanus, diphtheria, and acellular pertussis  (Tdap, Td) vaccine. You may need a Td booster every 10 years. ?Zoster vaccine. You may need this after age 70. ?Pneumococcal 13-valent conjugate (PCV13) vaccine. One dose is recommended after age 77. ?Pneumococcal polysaccharide (PPSV23) vaccine. One dose is recommended after age 5. ?Talk to your health care provider about which screenings and vaccines you need and how often you need them. ?This information is not intended to replace advice given to you by your health care provider. Make sure you discuss any questions you have with your health care provider. ?Document Released: 12/08/2015 Document Revised: 07/31/2016 Document Reviewed: 09/12/2015 ?Elsevier Interactive Patient Education ? 2017 [place]. ? ?Fall Prevention in the Home ?Falls can cause injuries. They can happen to people of all ages. There are many things you can do to make your home safe and to help prevent falls. ?What can I do on the outside of my home? ?Regularly fix the edges of walkways and driveways and fix any cracks. ?Remove anything that might make you trip as you walk through a door, such as a raised step or threshold. ?Trim any bushes or trees on the path to your home. ?Use bright outdoor lighting. ?Clear any walking paths of anything that might make someone trip, such as rocks or tools. ?Regularly check to see if handrails are loose or broken. Make sure that both sides of any steps have handrails. ?Any raised decks and porches should have guardrails on the edges. ?Have any leaves, snow, or ice cleared regularly. ?Use sand or salt on walking paths during winter. ?Clean up any spills in your garage right away. This includes oil or grease spills. ?What can I do in the bathroom? ?Use night lights. ?Install grab bars by the toilet and in the tub and shower. Do not use towel bars as grab bars. ?Use non-skid mats or decals in the tub or shower. ?If you need to sit down in the shower, use a plastic, non-slip stool. ?Keep the floor dry. Clean  up any water that spills on the floor as soon as it happens. ?Remove soap buildup in the tub or shower regularly. ?Attach bath mats securely with double-sided non-slip rug tape. ?Do not have throw rugs and other things on the floor that can make you trip. ?What can I do in the bedroom? ?Use night lights. ?Make sure that you have a light by your bed that is easy to reach. ?Do not use any sheets or blankets that are too big for your bed. They should not hang down onto the floor. ?Have a firm chair that has side arms. You can use this for support while you get dressed. ?Do not have throw rugs and other things on the floor that can make you trip. ?What can I do in the kitchen? ?Clean up any spills right away. ?Avoid walking on wet floors. ?Keep items that you use a lot in easy-to-reach places. ?If you need to reach something above you, use a strong step stool that has a grab bar. ?Keep electrical cords out of the way. ?Do not use floor polish or wax that makes floors slippery. If you must use wax, use non-skid floor wax. ?Do not have throw rugs and other things on the floor that can make you trip. ?What can I do with my stairs? ?Do not leave any items on the stairs. ?Make sure that there are handrails on both sides of the stairs and use them. Fix handrails that are  broken or loose. Make sure that handrails are as long as the stairways. ?Check any carpeting to make sure that it is firmly attached to the stairs. Fix any carpet that is loose or worn. ?Avoid having throw rugs at the top or bottom of the stairs. If you do have throw rugs, attach them to the floor with carpet tape. ?Make sure that you have a light switch at the top of the stairs and the bottom of the stairs. If you do not have them, ask someone to add them for you. ?What else can I do to help prevent falls? ?Wear shoes that: ?Do not have high heels. ?Have rubber bottoms. ?Are comfortable and fit you well. ?Are closed at the toe. Do not wear sandals. ?If you  use a stepladder: ?Make sure that it is fully opened. Do not climb a closed stepladder. ?Make sure that both sides of the stepladder are locked into place. ?Ask someone to hold it for you, if possible. ?C

## 2022-03-01 ENCOUNTER — Telehealth: Payer: Self-pay | Admitting: *Deleted

## 2022-03-01 NOTE — Chronic Care Management (AMB) (Signed)
?  Care Management  ? ?Note ? ?03/01/2022 ?Name: RUT BETTERTON MRN: [id number] DOB: June 06, 1946 ? ?MALAINA MORTELLARO is a 76 y.o. year old female who is a primary care patient of Delsa Grana, [place]. I reached out to [name] by phone today offer care coordination services.  ? ?Ms. Snyders was given information about care management services today including:  ?Care management services include personalized support from designated clinical staff supervised by her physician, including individualized plan of care and coordination with other care providers ?24/7 contact phone numbers for assistance for urgent and routine care needs. ?The patient may stop care management services at any time by phone call to the office staff. ? ?Patient agreed to services and verbal consent obtained.  ? ?Follow up plan: ?Telephone appointment with care management team member scheduled for: 03/06/2022 ? ?Devontay Celaya, CCMA ?Care Guide, Embedded Care Coordination ?[facility]  Care Management  ?Direct Dial: [phone] ? ? ?

## 2022-03-05 DIAGNOSIS — M0609 Rheumatoid arthritis without rheumatoid factor, multiple sites: Secondary | ICD-10-CM | POA: Diagnosis not present

## 2022-03-06 ENCOUNTER — Ambulatory Visit: Payer: Medicare Other

## 2022-03-06 DIAGNOSIS — I10 Essential (primary) hypertension: Secondary | ICD-10-CM

## 2022-03-06 DIAGNOSIS — J438 Other emphysema: Secondary | ICD-10-CM

## 2022-03-06 DIAGNOSIS — E782 Mixed hyperlipidemia: Secondary | ICD-10-CM

## 2022-03-06 NOTE — Patient Instructions (Addendum)
Thank you for allowing the Chronic Care Management team to participate in your care. It was great speaking with you today! ? ? ?Following is a copy of your care plan:  ?Problem: HTN, HLD, Emphysema   ?  ? ?Long-Range Goal: Disease Progression Prevented or Minimized   ?Start Date: 03/06/2022  ?Expected End Date: 06/04/2022  ?Priority: High  ?Note:   ?Current Barriers:  ?Chronic Disease Management support and education needs related to HTN, HLD, and Emphysema.  ? ?RNCM Clinical Goal(s):  ?Patient will demonstrate Ongoing adherence to prescribed treatment plan for HTN, HLD, and Emphysema through collaboration with the provider, RN Care manager and the care team.  ? ?Interventions: ?1:1 collaboration with primary care provider regarding development and update of comprehensive plan of care as evidenced by provider attestation and co-signature ?Inter-disciplinary care team collaboration (see longitudinal plan of care) ?Evaluation of current treatment plan related to  self management and patient's adherence to plan as established by provider ? ? ?Hyperlipidemia Interventions:   ?Lab Results  ?Component Value Date  ? CHOL 161 06/29/2021  ? HDL 81 06/29/2021  ? [place] 69 06/29/2021  ? TRIG 40 06/29/2021  ? CHOLHDL 2.0 06/29/2021  ?  ?Medications reviewed  ?Reviewed provider established cholesterol goals  ?Dsicussed importance of regular laboratory monitoring as prescribed ?Reviewed role and benefits of statin for ASCVD risk reduction ?Discussed strategies to manage statin-induced myalgias. Reports tolerating statin well. ?Reviewed importance of limiting foods high in cholesterol ?Reviewed exercise goals. Advised to engage in low impact exercise as tolerated. ? ? ?Emphysema Interventions: ?Reviewed plan for Emphysema management. ?Discussed triggers. Reports symptoms are usually triggered after extending sinus congestion or rhinitis. Reports symptoms have been controlled. Advised to continue taking needed precautions to prevent  exacerbation.  ?Discussed importance of daily self-assessment. Advised to make an appointment with provider if experiencing moderate symptoms for greater than 48 hours without improvement.  ?Advised to utilize prevention strategies to reduce risk of respiratory infection  ?Reviewed worsening symptoms that require immediate medical attention.  ? ? ?Hypertension Interventions:   ?Last practice recorded BP readings:  ?BP Readings from Last 3 Encounters:  ?02/26/22 (!) 142/80  ?02/26/22 (!) 142/80  ?01/04/22 140/78  ?Most recent eGFR/CrCl:  ?Lab Results  ?Component Value Date  ? EGFR 80 06/29/2021  ?  No components found for: CRCL ? ?Reviewed medications and plan for hypertension management. Reports excellent compliance with medications. ?Provided information regarding established blood pressure parameters along with indications for notifying a provider. Reports home readings have been within range. Advised to continue monitoring and record readings.  ?Discussed compliance with recommended cardiac prudent diet. Reports limiting sodium and doing very well with nutritional intake. Encouraged to read nutrition labels and avoid highly processed foods when possible. ?Discussed complications of uncontrolled blood pressure.  ?Reviewed s/sx of heart attack, stroke and worsening symptoms that require immediate medical attention. ? ? ? ?Patient Goals/Self-Care Activities: ?Take all medications as prescribed ?Attend all scheduled provider appointments ?Call pharmacy for medication refills 3-7 days in advance of running out of medications ?Perform all self care activities independently  ?Perform IADL's (shopping, preparing meals, housekeeping, managing finances) independently ?Call provider office for new concerns or questions  ? ?PLAN ?Will follow up next month. ?  ? ? ?The patient verbalized understanding of instructions, educational materials, and care plan provided today and declined offer to receive copy of patient  instructions, educational materials, and care plan.  ? ? ?A member of the care management team will follow up next month. ? ? ?  Jamicah Anstead,RN ?[place]/[facility] Care Management ?[facility] ?[phone] ? ?

## 2022-03-06 NOTE — Chronic Care Management (AMB) (Signed)
? Care Management ?  ? RN Visit Note ? ?03/06/2022 ?Name: LILLYAHNA HEMBERGER MRN: [id number] DOB: 03-02-1946 ? ?Subjective: ?Holly Green is a 76 y.o. year old female who is a primary care patient of Delsa Grana, [place]. The care management team was consulted for assistance with disease management and care coordination needs.   ? ?Engaged with patient by telephone for initial visit in response to provider referral for case management and care coordination services.  ? ?Consent to Services:  ? Ms. Troxler was given information about Care Management services including:  ?Care Management services includes personalized support from designated clinical staff supervised by her physician, including individualized plan of care and coordination with other care providers ?24/7 contact phone numbers for assistance for urgent and routine care needs. ?The patient may stop case management services at any time by phone call to the office staff. ? ?Patient agreed to services and consent obtained.  ? ?Assessment: Review of patient past medical history, allergies, medications, health status, including review of consultants reports, laboratory and other test data, was performed as part of comprehensive evaluation and provision of chronic care management services.  ? ?SDOH (Social Determinants of Health) assessments and interventions performed:  ?SDOH Interventions   ? ?Flowsheet Row Most Recent Value  ?SDOH Interventions   ?Food Insecurity Interventions Intervention Not Indicated  ?Transportation Interventions Intervention Not Indicated  ? ?  ?  ? ?Care Plan ? ?Allergies  ?Allergen Reactions  ? Aspirin Other (See Comments)  ?  Diverticulosis  ? Lisinopril Cough  ? ? ?Outpatient Encounter Medications as of 03/06/2022  ?Medication Sig Note  ? acetaminophen (TYLENOL) 650 MG CR tablet Take 650-1,300 mg by mouth every 8 (eight) hours as needed for pain. 12/29/2020: prn  ? bisoprolol-hydrochlorothiazide (ZIAC) 10-6.25 MG tablet Take 1 tablet by mouth  daily.   ? Calcium Carbonate (CALCIUM 600 PO) Take 1,200 mg by mouth daily.   ? Certolizumab Pegol 2 X 200 MG/ML PSKT Inject 400 mg into the skin every 28 (twenty-eight) days.   ? Cholecalciferol (VITAMIN D) 50 MCG (2000 UT) CAPS Take by mouth.   ? clotrimazole (CLOTRIMAZOLE AF) 1 % cream Apply 1 application. topically 2 (two) times daily.   ? ferrous sulfate (FERROUSUL) 325 (65 FE) MG tablet Take 1 tablet daily.   ? folic acid (FOLVITE) 1 MG tablet Take 1 tablet by mouth daily.   ? loratadine (CLARITIN) 10 MG tablet Take 1 tablet (10 mg total) by mouth daily.   ? losartan (COZAAR) 50 MG tablet Take 1 tablet (50 mg total) by mouth daily.   ? lovastatin (MEVACOR) 40 MG tablet Take 1 tablet (40 mg total) by mouth at bedtime.   ? methotrexate 2.5 MG tablet Take 10 mg by mouth once a week. Pt takes every sunday   ? polyethylene glycol (MIRALAX) 17 g packet Take 17 g by mouth daily.   ? ?No facility-administered encounter medications on file as of 03/06/2022.  ? ? ?Patient Active Problem List  ? Diagnosis Date Noted  ? IDA (iron deficiency anemia) 09/28/2021  ? Rheumatoid arthritis of multiple sites with negative rheumatoid factor ([place]) 06/09/2020  ? Obesity (BMI 30.0-34.9) 08/10/2018  ? Emphysema of lung ([place]) 08/10/2018  ? Allergic rhinitis 08/10/2018  ? Primary osteoarthritis of right knee 07/17/2018  ? Spondylolisthesis at L4-L5 level 03/07/2017  ? Abdominal aortic atherosclerosis ([place]) 03/05/2017  ? Benign liver cyst 03/05/2017  ? GERD (gastroesophageal reflux disease) 08/07/2016  ? Osteoporosis 05/07/2016  ? Prediabetes 10/15/2015  ?  Hyperlipidemia 10/15/2015  ? Essential hypertension 10/15/2015  ? Diverticulosis of colon, acquired 10/15/2015  ? ? ? ?Patient Care Plan: RN Care Management Plan of Care  ?  ? ?Problem Identified: HTN, HLD, Emphysema   ?  ? ?Long-Range Goal: Disease Progression Prevented or Minimized   ?Start Date: 03/06/2022  ?Expected End Date: 06/04/2022  ?Priority: High  ?Note:   ?Current Barriers:   ?Chronic Disease Management support and education needs related to HTN, HLD, and Emphysema.  ? ?RNCM Clinical Goal(s):  ?Patient will demonstrate Ongoing adherence to prescribed treatment plan for HTN, HLD, and Emphysema through collaboration with the provider, RN Care manager and the care team.  ? ?Interventions: ?1:1 collaboration with primary care provider regarding development and update of comprehensive plan of care as evidenced by provider attestation and co-signature ?Inter-disciplinary care team collaboration (see longitudinal plan of care) ?Evaluation of current treatment plan related to  self management and patient's adherence to plan as established by provider ? ? ?Hyperlipidemia Interventions:   ?Lab Results  ?Component Value Date  ? CHOL 161 06/29/2021  ? HDL 81 06/29/2021  ? [place] 69 06/29/2021  ? TRIG 40 06/29/2021  ? CHOLHDL 2.0 06/29/2021  ?  ?Medications reviewed  ?Reviewed provider established cholesterol goals  ?Dsicussed importance of regular laboratory monitoring as prescribed ?Reviewed role and benefits of statin for ASCVD risk reduction ?Discussed strategies to manage statin-induced myalgias. Reports tolerating statin well. ?Reviewed importance of limiting foods high in cholesterol ?Reviewed exercise goals. Advised to engage in low impact exercise as tolerated. ? ? ?Emphysema Interventions: ?Reviewed plan for Emphysema management. ?Discussed triggers. Reports symptoms are usually triggered after extending sinus congestion or rhinitis. Reports symptoms have been controlled. Advised to continue taking needed precautions to prevent exacerbation.  ?Discussed importance of daily self-assessment. Advised to make an appointment with provider if experiencing moderate symptoms for greater than 48 hours without improvement.  ?Advised to utilize prevention strategies to reduce risk of respiratory infection  ?Reviewed worsening symptoms that require immediate medical attention.  ? ? ?Hypertension  Interventions:   ?Last practice recorded BP readings:  ?BP Readings from Last 3 Encounters:  ?02/26/22 (!) 142/80  ?02/26/22 (!) 142/80  ?01/04/22 140/78  ?Most recent eGFR/CrCl:  ?Lab Results  ?Component Value Date  ? EGFR 80 06/29/2021  ?  No components found for: CRCL ? ?Reviewed medications and plan for hypertension management. Reports excellent compliance with medications. ?Provided information regarding established blood pressure parameters along with indications for notifying a provider. Reports home readings have been within range. Advised to continue monitoring and record readings.  ?Discussed compliance with recommended cardiac prudent diet. Reports limiting sodium and doing very well with nutritional intake. Encouraged to read nutrition labels and avoid highly processed foods when possible. ?Discussed complications of uncontrolled blood pressure.  ?Reviewed s/sx of heart attack, stroke and worsening symptoms that require immediate medical attention. ? ? ? ?Patient Goals/Self-Care Activities: ?Take all medications as prescribed ?Attend all scheduled provider appointments ?Call pharmacy for medication refills 3-7 days in advance of running out of medications ?Perform all self care activities independently  ?Perform IADL's (shopping, preparing meals, housekeeping, managing finances) independently ?Call provider office for new concerns or questions  ? ?PLAN ?Will follow up next month. ?  ?  ? ?PLAN:  ?A member of the care management team will follow up next month. ? ? ?Pantera Winterrowd,RN ?[place]/[facility] Care Management ?[facility] ?[phone] ? ? ? ? ? ? ? ? ?

## 2022-03-07 ENCOUNTER — Ambulatory Visit (INDEPENDENT_AMBULATORY_CARE_PROVIDER_SITE_OTHER): Payer: Medicare Other | Admitting: Dermatology

## 2022-03-07 DIAGNOSIS — R21 Rash and other nonspecific skin eruption: Secondary | ICD-10-CM

## 2022-03-07 DIAGNOSIS — L308 Other specified dermatitis: Secondary | ICD-10-CM | POA: Diagnosis not present

## 2022-03-07 DIAGNOSIS — B353 Tinea pedis: Secondary | ICD-10-CM | POA: Diagnosis not present

## 2022-03-07 NOTE — Patient Instructions (Addendum)

## 2022-03-07 NOTE — Progress Notes (Signed)
? ?  New Patient Visit ? ?Subjective  ?Holly Green is a 76 y.o. female who presents for the following: Rash (Pt c/o itchy painful rash on her feet and ankles x 2 months). ?She has been treating with a topical antifungal, clotrimazole 4 weeks without improvement. ? ?The following portions of the chart were reviewed this encounter and updated as appropriate:  ? Tobacco  Allergies  Meds  Problems  Med Hx  Surg Hx  Fam Hx   ?  ?Review of Systems:  No other skin or systemic complaints except as noted in HPI or Assessment and Plan. ? ?Objective  ?Well appearing patient in no apparent distress; mood and affect are within normal limits. ? ?A focused examination was performed including feet,ankles. Relevant physical exam findings are noted in the Assessment and Plan. ? ?right foot, left foot ?Annular hyperpigmented scaly plaques  ? ? ? ? ? ? ? ? ? ? ? ? ?Left Foot - Anterior ?scaliness of the feet and toenail dystrphy  ? ? ?Assessment & Plan  ?Rash ?right foot, left foot ? ?Cont Clotrimazole cream as directed ? ?Skin / nail biopsy - right foot, left foot ?Type of biopsy: tangential   ?Informed consent: discussed and consent obtained   ?Patient was prepped and draped in usual sterile fashion: area prepped with alochol. ?Anesthesia: the lesion was anesthetized in a standard fashion   ?Anesthetic:  1% lidocaine w/ epinephrine 1-100,000 buffered w/ 8.4% NaHCO3 ?Instrument used: flexible razor blade   ?Hemostasis achieved with: pressure, aluminum chloride and electrodesiccation   ?Outcome: patient tolerated procedure well   ?Post-procedure details: wound care instructions given   ?Post-procedure details comment:  Ointment and small bandage ? ?Specimen 1 - Surgical pathology ?Differential Diagnosis: R/O Eczema vs Tinea vs other  ? ?Check Margins: No ? ?Tinea pedis of left foot ?Left Foot - Anterior ? ?Chronic and persistent condition with duration or expected duration over one year. Condition is symptomatic / bothersome  to patient. Not to goal.  ? ?Continue clotrimazole cream until biopsy results. ? ?Return if symptoms worsen or fail to improve. ? ?I, Marye Round, CMA, am acting as scribe for Sarina Ser, MD .  ?Documentation: I have reviewed the above documentation for accuracy and completeness, and I agree with the above. ? ?Sarina Ser, MD ? ?

## 2022-03-12 ENCOUNTER — Encounter: Payer: Self-pay | Admitting: Dermatology

## 2022-03-14 ENCOUNTER — Telehealth: Payer: Self-pay

## 2022-03-14 MED ORDER — MOMETASONE FUROATE 0.1 % EX CREA: TOPICAL_CREAM | CUTANEOUS | 2 refills | Status: DC

## 2022-03-14 NOTE — Telephone Encounter (Signed)
-----   Message from Ralene Bathe, MD sent at 03/12/2022  5:21 PM EDT ----- ?Diagnosis ?Skin , right foot ?SPONGIOTIC DERMATITIS CONSISTENT WITH DYSHIDROTIC ECZEMA, SEE DESCRIPTION ? ?Most consistent with Eczema (less likely Contact Dermatitis vs Id Reaction) ?No evidence of fungus. ?Start Mometasone cream bid disp 45 g 0rf  -- send to pharmacy ?Make pt appt for 4 weeks for recheck. ?If not improving or if recurs, consider Patch Testing ? ?

## 2022-03-14 NOTE — Telephone Encounter (Signed)
Discussed biopsy results with pt, called in Mometasone cream to [facility] [address], scheduled return appt for 4 weeks ?

## 2022-04-02 DIAGNOSIS — M0609 Rheumatoid arthritis without rheumatoid factor, multiple sites: Secondary | ICD-10-CM | POA: Diagnosis not present

## 2022-04-08 ENCOUNTER — Inpatient Hospital Stay: Payer: Medicare Other | Attending: Oncology

## 2022-04-08 DIAGNOSIS — Z90710 Acquired absence of both cervix and uterus: Secondary | ICD-10-CM | POA: Diagnosis not present

## 2022-04-08 DIAGNOSIS — D500 Iron deficiency anemia secondary to blood loss (chronic): Secondary | ICD-10-CM

## 2022-04-08 DIAGNOSIS — Z803 Family history of malignant neoplasm of breast: Secondary | ICD-10-CM | POA: Insufficient documentation

## 2022-04-08 DIAGNOSIS — Z8042 Family history of malignant neoplasm of prostate: Secondary | ICD-10-CM | POA: Insufficient documentation

## 2022-04-08 DIAGNOSIS — K5730 Diverticulosis of large intestine without perforation or abscess without bleeding: Secondary | ICD-10-CM | POA: Insufficient documentation

## 2022-04-08 DIAGNOSIS — R109 Unspecified abdominal pain: Secondary | ICD-10-CM | POA: Insufficient documentation

## 2022-04-08 DIAGNOSIS — I10 Essential (primary) hypertension: Secondary | ICD-10-CM | POA: Diagnosis not present

## 2022-04-08 DIAGNOSIS — K921 Melena: Secondary | ICD-10-CM | POA: Insufficient documentation

## 2022-04-08 DIAGNOSIS — D509 Iron deficiency anemia, unspecified: Secondary | ICD-10-CM | POA: Insufficient documentation

## 2022-04-08 LAB — CBC WITH DIFFERENTIAL/PLATELET
Abs Immature Granulocytes: 0.03 10*3/uL (ref 0.00–0.07)
Basophils Absolute: 0 10*3/uL (ref 0.0–0.1)
Basophils Relative: 0 %
Eosinophils Absolute: 0.1 10*3/uL (ref 0.0–0.5)
Eosinophils Relative: 3 %
HCT: 36.1 % (ref 36.0–46.0)
Hemoglobin: 12 g/dL (ref 12.0–15.0)
Immature Granulocytes: 1 %
Lymphocytes Relative: 38 %
Lymphs Abs: 1.9 10*3/uL (ref 0.7–4.0)
MCH: 31.7 pg (ref 26.0–34.0)
MCHC: 33.2 g/dL (ref 30.0–36.0)
MCV: 95.5 fL (ref 80.0–100.0)
Monocytes Absolute: 0.4 10*3/uL (ref 0.1–1.0)
Monocytes Relative: 9 %
Neutro Abs: 2.4 10*3/uL (ref 1.7–7.7)
Neutrophils Relative %: 49 %
Platelets: 239 10*3/uL (ref 150–400)
RBC: 3.78 MIL/uL — ABNORMAL LOW (ref 3.87–5.11)
RDW: 13.3 % (ref 11.5–15.5)
WBC: 4.9 10*3/uL (ref 4.0–10.5)
nRBC: 0 % (ref 0.0–0.2)

## 2022-04-08 LAB — FERRITIN: Ferritin: 42 ng/mL (ref 11–307)

## 2022-04-08 LAB — IRON AND TIBC
Iron: 47 ug/dL (ref 28–170)
Saturation Ratios: 14 % (ref 10.4–31.8)
TIBC: 346 ug/dL (ref 250–450)
UIBC: 299 ug/dL

## 2022-04-09 ENCOUNTER — Inpatient Hospital Stay (HOSPITAL_BASED_OUTPATIENT_CLINIC_OR_DEPARTMENT_OTHER): Payer: Medicare Other | Admitting: Oncology

## 2022-04-09 ENCOUNTER — Encounter: Payer: Self-pay | Admitting: Oncology

## 2022-04-09 VITALS — BP 185/98 | HR 69 | Temp 98.3°F | Resp 20 | Wt 194.3 lb

## 2022-04-09 DIAGNOSIS — K5730 Diverticulosis of large intestine without perforation or abscess without bleeding: Secondary | ICD-10-CM | POA: Diagnosis not present

## 2022-04-09 DIAGNOSIS — K921 Melena: Secondary | ICD-10-CM | POA: Diagnosis not present

## 2022-04-09 DIAGNOSIS — D509 Iron deficiency anemia, unspecified: Secondary | ICD-10-CM | POA: Diagnosis not present

## 2022-04-09 DIAGNOSIS — I10 Essential (primary) hypertension: Secondary | ICD-10-CM | POA: Diagnosis not present

## 2022-04-09 DIAGNOSIS — D500 Iron deficiency anemia secondary to blood loss (chronic): Secondary | ICD-10-CM | POA: Diagnosis not present

## 2022-04-09 DIAGNOSIS — R109 Unspecified abdominal pain: Secondary | ICD-10-CM

## 2022-04-09 DIAGNOSIS — Z90710 Acquired absence of both cervix and uterus: Secondary | ICD-10-CM | POA: Diagnosis not present

## 2022-04-09 NOTE — Progress Notes (Signed)
?Hematology/Oncology Progress note ?[phone] [phone] ?  ? ? ? ?Patient Care Team: ?Delsa Grana, PA-C as PCP - General (Family Medicine) ?Earlie Server, MD as Consulting Physician (Hematology and Oncology) ?Quintin Alto, MD as Consulting Physician (Rheumatology) ?Neldon Labella, RN as Case Manager ? ?REFERRING PROVIDER: ?Delsa Grana, PA-C ?CHIEF COMPLAINTS/REASON FOR VISIT:  ?Follow-up in iron deficiency anemia ? ?HISTORY OF PRESENTING ILLNESS:  ?Holly Green is a  76 y.o.  female with PMH listed below was seen in consultation at the request of Delsa Grana, PA-C ?  for evaluation of iron deficiency anemia.  ?Patient has a remote history of diverticula bleeding status post positive bleeding scan and embolization 2009 and 2015. ?Patient was admitted from 12/29/2019-12/31/2019 due to GI bleeding.  Patient had a 2 g hemoglobin drop over the past 2 months prior to admission.  Hemoglobin was 10.  Iron panel showed iron deficiency with ferritin off 32 and iron saturation at 11.  Conservative measures was recommended by gastroenterology.  Patient was discharged. ?02/22/2020, EGD showed small hiatal hernia, normal stomach and duodenum.  No specimen was collected. ?11/09/2017, colonoscopy showed: Diverticulosis.  Otherwise normal. ?No recent blood work after February was done. ?Patient is currently taking oral iron supplementation ? ?#  05/19/2021 for rectal bleeding and abdominal pain.  Imaging showed diverticulitis and she was treated with Augmentin and discharged home.  She was seen again on 05/25/2021 for same concern but noted to have significant drop in her hemoglobin. She was given IV Zosyn and 1 unit PRBC.  She was admitted and discharged on 05/28/2021 ? ?On methotrexate ? ?INTERVAL HISTORY ?Holly Green is a 76 y.o. female who has above history reviewed by me today presents for follow up visit for management of anemia ?Patient reports feeling well.  No new complaints. ? ?Patient reports of  chronic lower abdominal soreness, sometimes improved after bowel movement.  She has noticed blood in the stool twice the past week.  Otherwise she has no new complaints.  Her weight has been stable. ?Patient has a history of diverticulosis and previously had a colonoscopy. ? ? ? ?Review of Systems  ?Constitutional:  Negative for appetite change, chills, fatigue and fever.  ?HENT:   Negative for hearing loss and voice change.   ?Eyes:  Negative for eye problems.  ?Respiratory:  Negative for chest tightness and cough.   ?Cardiovascular:  Negative for chest pain.  ?Gastrointestinal:  Negative for abdominal distention, abdominal pain and blood in stool.  ?Endocrine: Negative for hot flashes.  ?Genitourinary:  Negative for difficulty urinating and frequency.   ?Musculoskeletal:  Negative for arthralgias.  ?Skin:  Negative for itching and rash.  ?Neurological:  Negative for extremity weakness.  ?Hematological:  Negative for adenopathy.  ?Psychiatric/Behavioral:  Negative for confusion.   ? ?MEDICAL HISTORY:  ?Past Medical History:  ?Diagnosis Date  ? Abdominal aortic atherosclerosis ([place]) 03/05/2017  ? CT scan April 2018  ? Acute cystitis with hematuria 09/29/2020  ? Arthritis   ? Bleeding disorder ([place])   ? Cardiac murmur 05/07/2016  ? Chondrocalcinosis 07/17/2018  ? Last Assessment & Plan:  Mild lateral compartment OA chondrocalcinosis  ? 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 COAD (chronic obstructive airways disease) ([place]) 08/03/2018  ? Diverticulitis 2015  ? Gastrointestinal hemorrhage 12/29/2019  ? GERD (gastroesophageal reflux disease)   ? GI bleeding 05/25/2021  ? History of GI bleed 11/06/2017  ? History of kidney stones   ? History of stomach ulcers   ?  Hyperlipidemia   ? Hypertension   ? Left hip pain 10/15/2019  ? Osteoporosis   ? Pre-diabetes   ? Prediabetes 10/15/2015  ? Simple cyst of kidney 03/05/2017  ? CT scan April 2018  ? Spondylolisthesis at L4-L5 level 03/07/2017   ? ? ?SURGICAL HISTORY: ?Past Surgical History:  ?Procedure Laterality Date  ? ABDOMINAL HYSTERECTOMY    ? BACK SURGERY  2010  ? BREAST CYST ASPIRATION Right 1990's  ? COLONOSCOPY Left 11/09/2017  ? Procedure: COLONOSCOPY;  Surgeon: Virgel Manifold, MD;  Location: [facility] ENDOSCOPY;  Service: Endoscopy;  Laterality: Left;  ? COLONOSCOPY WITH PROPOFOL N/A 04/14/2020  ? Procedure: COLONOSCOPY WITH PROPOFOL;  Surgeon: Virgel Manifold, MD;  Location: [facility] ENDOSCOPY;  Service: Endoscopy;  Laterality: N/A;  ? CYSTO WITH HYDRODISTENSION N/A 08/15/2017  ? Procedure: CYSTOSCOPY/HYDRODISTENSION;  Surgeon: Nickie Retort, MD;  Location: [facility] ORS;  Service: Urology;  Laterality: N/A;  ? ESOPHAGOGASTRODUODENOSCOPY Left 11/09/2017  ? Procedure: ESOPHAGOGASTRODUODENOSCOPY (EGD);  Surgeon: Virgel Manifold, MD;  Location[facility] ENDOSCOPY;  Service: Endoscopy;  Laterality: Left;  ? ESOPHAGOGASTRODUODENOSCOPY (EGD) WITH PROPOFOL N/A 02/22/2020  ? Procedure: ESOPHAGOGASTRODUODENOSCOPY (EGD) WITH PROPOFOL;  Surgeon: Lucilla Lame, MD;  Location: [facility] ENDOSCOPY;  Service: Endoscopy;  Laterality: N/A;  ? FOOT SURGERY Right   ? ? ?SOCIAL HISTORY: ?Social History  ? ?Socioeconomic History  ? Marital status: Single  ?  Spouse name: Not on file  ? Number of children: 1  ? Years of education: Not on file  ? Highest education level: High school graduate  ?Occupational History  ? Occupation: retired  ?Tobacco Use  ? Smoking status: Never  ? Smokeless tobacco: Never  ?Vaping Use  ? Vaping Use: Never used  ?Substance and Sexual Activity  ? Alcohol use: No  ?  Alcohol/week: 0.0 standard drinks  ? Drug use: No  ? Sexual activity: Never  ?Other Topics Concern  ? Not on file  ?Social History Narrative  ? Pt's nephew lives with her  ? Works part time at [employer]  ? ?Social Determinants of Health  ? ?Financial Resource Strain: Low Risk   ? Difficulty of Paying Living Expenses: Not hard at all  ?Food Insecurity: No Food Insecurity  ? Worried  About Charity fundraiser in the Last Year: Never true  ? Ran Out of Food in the Last Year: Never true  ?Transportation Needs: No Transportation Needs  ? Lack of Transportation (Medical): No  ? Lack of Transportation (Non-Medical): No  ?Physical Activity: Inactive  ? Days of Exercise per Week: 0 days  ? Minutes of Exercise per Session: 0 min  ?Stress: No Stress Concern Present  ? Feeling of Stress : Not at all  ?Social Connections: Moderately Isolated  ? Frequency of Communication with Friends and Family: More than three times a week  ? Frequency of Social Gatherings with Friends and Family: Once a week  ? Attends Religious Services: More than 4 times per year  ? Active Member of Clubs or Organizations: No  ? Attends Archivist Meetings: Never  ? Marital Status: Never married  ?Intimate Partner Violence: Not At Risk  ? Fear of Current or Ex-Partner: No  ? Emotionally Abused: No  ? Physically Abused: No  ? Sexually Abused: No  ? ? ?FAMILY HISTORY: ?Family History  ?Problem Relation Age of Onset  ? Hypertension Brother   ? Heart disease Brother   ? Heart failure Brother   ? Hypertension Mother   ? Stroke Mother   ?  Stroke Father   ? Prostate cancer Father   ? Aneurysm Sister   ? Breast cancer Sister 73  ? Liver disease Maternal Aunt   ? Heart failure Paternal Aunt   ? Heart disease Paternal Uncle   ? Heart attack Brother   ? Heart disease Brother   ? Breast cancer Maternal Aunt   ? Heart disease Paternal Aunt   ? Kidney cancer Neg Hx   ? Bladder Cancer Neg Hx   ? ? ?ALLERGIES:  is allergic to aspirin and lisinopril. ? ?MEDICATIONS:  ?Current Outpatient Medications  ?Medication Sig Dispense Refill  ? acetaminophen (TYLENOL) 650 MG CR tablet Take 650-1,300 mg by mouth every 8 (eight) hours as needed for pain.    ? bisoprolol-hydrochlorothiazide (ZIAC) 10-6.25 MG tablet Take 1 tablet by mouth daily. 90 tablet 3  ? Calcium Carbonate (CALCIUM 600 PO) Take 1,200 mg by mouth daily.    ? Certolizumab Pegol 2 X 200  MG/ML PSKT Inject 400 mg into the skin every 28 (twenty-eight) days.    ? Cholecalciferol (VITAMIN D) 50 MCG (2000 UT) CAPS Take by mouth.    ? clotrimazole (CLOTRIMAZOLE AF) 1 % cream Apply 1 application. topically 2

## 2022-04-14 DIAGNOSIS — M7752 Other enthesopathy of left foot: Secondary | ICD-10-CM | POA: Diagnosis not present

## 2022-04-14 DIAGNOSIS — I10 Essential (primary) hypertension: Secondary | ICD-10-CM | POA: Diagnosis not present

## 2022-04-14 DIAGNOSIS — M25572 Pain in left ankle and joints of left foot: Secondary | ICD-10-CM | POA: Diagnosis not present

## 2022-04-17 ENCOUNTER — Ambulatory Visit (INDEPENDENT_AMBULATORY_CARE_PROVIDER_SITE_OTHER): Payer: Medicare Other | Admitting: Dermatology

## 2022-04-17 DIAGNOSIS — L821 Other seborrheic keratosis: Secondary | ICD-10-CM

## 2022-04-17 DIAGNOSIS — L209 Atopic dermatitis, unspecified: Secondary | ICD-10-CM | POA: Diagnosis not present

## 2022-04-17 NOTE — Patient Instructions (Addendum)
Recommend CeraVe cream moisturizer daily.     If You Need Anything After Your Visit  If you have any questions or concerns for your doctor, please call our main line at [phone] and press option 4 to reach your doctor's medical assistant. If no one answers, please leave a voicemail as directed and we will return your call as soon as possible. Messages left after 4 pm will be answered the following business day.   You may also send [name] a message via [place]. We typically respond to MyChart messages within 1-2 business days.  For prescription refills, please ask your pharmacy to contact our office. Our fax number is [phone].  If you have an urgent issue when the clinic is closed that cannot wait until the next business day, you can page your doctor at the number below.    Please note that while we do our best to be available for urgent issues outside of office hours, we are not available 24/7.   If you have an urgent issue and are unable to reach [name], you may choose to seek medical care at your doctor's office, retail clinic, urgent care center, or emergency room.  If you have a medical emergency, please immediately call 911 or go to the emergency department.  Pager Numbers  - Dr. Nehemiah Massed: [phone]  - Dr. Laurence Ferrari: [phone]  - Dr. Nicole Kindred: [phone]  In the event of inclement weather, please call our main line at [phone] for an update on the status of any delays or closures.  Dermatology Medication Tips: Please keep the boxes that topical medications come in in order to help keep track of the instructions about where and how to use these. Pharmacies typically print the medication instructions only on the boxes and not directly on the medication tubes.   If your medication is too expensive, please contact our office at [phone] option 4 or send [name] a message through [place].   We are unable to tell what your co-pay for medications will be in advance as this is  different depending on your insurance coverage. However, we may be able to find a substitute medication at lower cost or fill out paperwork to get insurance to cover a needed medication.   If a prior authorization is required to get your medication covered by your insurance company, please allow [name] 1-2 business days to complete this process.  Drug prices often vary depending on where the prescription is filled and some pharmacies may offer cheaper prices.  The website www.goodrx.com contains coupons for medications through different pharmacies. The prices here do not account for what the cost may be with help from insurance (it may be cheaper with your insurance), but the website can give you the price if you did not use any insurance.  - You can print the associated coupon and take it with your prescription to the pharmacy.  - You may also stop by our office during regular business hours and pick up a GoodRx coupon card.  - If you need your prescription sent electronically to a different pharmacy, notify our office through [facility] or by phone at [phone] option 4.     Si Usted Necesita Algo Despus de Su Visita  Tambin puede enviarnos un mensaje a travs de Pharmacist, community. Por lo general respondemos a los mensajes de MyChart en el transcurso de 1 a 2 das hbiles.  Para renovar recetas, por favor pida a su farmacia que se ponga en contacto con nuestra oficina. Oswald [place]ock  [facility] [phone].  Si tiene un asunto urgente cuando la clnica est cerrada y que no puede esperar hasta el siguiente da hbil, puede llamar/localizar a su doctor(a) al nmero que aparece a continuacin.   Por favor, tenga en cuenta que aunque hacemos todo lo posible para estar disponibles para asuntos urgentes fuera del horario de [place], no estamos disponibles las 24 horas del da, los 7 das de la [place].   Si tiene un problema urgente y no puede comunicarse con nosotros, puede optar por buscar  atencin mdica  en el consultorio de su doctor(a), en una clnica privada, en un centro de atencin urgente o en una sala de emergencias.  Si tiene Engineering geologist, por favor llame inmediatamente al 911 o vaya a la sala de emergencias.  Nmeros de bper  - Dr. Nehemiah Massed: [phone]  - Dra. Moye: [phone]  - Dra. Nicole Kindred: [phone]  En caso de inclemencias del [place], por favor llame a Johnsie Kindred principal al [phone] para una actualizacin sobre el [place] de cualquier retraso o cierre.  Consejos para la medicacin en dermatologa: Por favor, guarde las cajas en las que vienen los medicamentos de uso tpico para ayudarle a seguir las instrucciones sobre dnde y cmo usarlos. Las farmacias generalmente imprimen las instrucciones del medicamento slo en las cajas y no directamente en los tubos del [place].   Si su medicamento es muy caro, por favor, pngase en contacto con Zigmund Daniel llamando al [phone] y presione la opcin 4 o envenos un mensaje a travs de Pharmacist, community.   No podemos decirle cul ser su copago por los medicamentos por adelantado ya que esto es diferente dependiendo de la cobertura de su seguro. Sin embargo, es posible que podamos encontrar un medicamento sustituto a Electrical engineer un formulario para que el seguro cubra el medicamento que se considera necesario.   Si se requiere una autorizacin previa para que su compaa de seguros [place] su medicamento, por favor permtanos de 1 a 2 das hbiles para completar este proceso.  Los precios de los medicamentos varan con frecuencia dependiendo del Environmental consultant de dnde se surte la receta y alguna farmacias pueden ofrecer precios ms baratos.  El sitio web www.goodrx.com tiene cupones para medicamentos de Airline pilot. Los precios aqu no tienen en cuenta lo que podra costar con la ayuda del seguro (puede ser ms barato con su seguro), pero el sitio web puede darle el precio si no utiliz  Research scientist (physical sciences).  - Puede imprimir el cupn correspondiente y llevarlo con su receta a la farmacia.  - Tambin puede pasar por nuestra oficina durante el horario de atencin regular y Charity fundraiser una tarjeta de cupones de GoodRx.  - Si necesita que su receta se enve electrnicamente a una farmacia diferente, informe a nuestra oficina a travs de MyChart de [place] o por telfono llamando al [phone] y presione la opcin 4.

## 2022-04-17 NOTE — Progress Notes (Signed)
   Follow-Up Visit   Subjective  Holly Green is a 76 y.o. female who presents for the following: Atopic dermatitis (Bx proven - patient currently Mometasone to aa's QD. She started experiencing a similar rash under breast and she started using the Mometasone cream there as well. Both rashes have improved).  The following portions of the chart were reviewed this encounter and updated as appropriate:   Tobacco  Allergies  Meds  Problems  Med Hx  Surg Hx  Fam Hx     Review of Systems:  No other skin or systemic complaints except as noted in HPI or Assessment and Plan.  Objective  Well appearing patient in no apparent distress; mood and affect are within normal limits.  A focused examination was performed including the trunk and extremities. Relevant physical exam findings are noted in the Assessment and Plan.   Assessment & Plan  Atopic dermatitis - improving Trunk, extremities Bx proven - Atopic dermatitis (eczema) is a chronic, relapsing, pruritic condition that can significantly affect quality of life. It is often associated with allergic rhinitis and/or asthma and can require treatment with topical medications, phototherapy, or in severe cases biologic injectable medication (Dupixent; Adbry) or Oral JAK inhibitors.  Decrease Mometasone to QD 5d/wk.  Moisturize daily with CeraVe cream.    Seborrheic Keratoses - inframammary  - Ok to continue Mometasone 0.1% cream to aa's QD up to 5d/wk when inflamed. - Stuck-on, waxy, tan-brown papules and/or plaques  - Benign-appearing - Discussed benign etiology and prognosis. - Observe - Call for any changes  Return in about 6 months (around 10/18/2022) for AD follow up .  Luther Redo, CMA, am acting as scribe for Sarina Ser, MD . Documentation: I have reviewed the above documentation for accuracy and completeness, and I agree with the above.  Sarina Ser, MD

## 2022-04-22 ENCOUNTER — Encounter: Payer: Self-pay | Admitting: Dermatology

## 2022-04-29 DIAGNOSIS — M2012 Hallux valgus (acquired), left foot: Secondary | ICD-10-CM | POA: Diagnosis not present

## 2022-04-29 DIAGNOSIS — M2141 Flat foot [pes planus] (acquired), right foot: Secondary | ICD-10-CM | POA: Diagnosis not present

## 2022-04-29 DIAGNOSIS — M216X1 Other acquired deformities of right foot: Secondary | ICD-10-CM | POA: Diagnosis not present

## 2022-04-29 DIAGNOSIS — Z8739 Personal history of other diseases of the musculoskeletal system and connective tissue: Secondary | ICD-10-CM | POA: Diagnosis not present

## 2022-04-29 DIAGNOSIS — M2011 Hallux valgus (acquired), right foot: Secondary | ICD-10-CM | POA: Diagnosis not present

## 2022-04-29 DIAGNOSIS — M216X2 Other acquired deformities of left foot: Secondary | ICD-10-CM | POA: Diagnosis not present

## 2022-04-29 DIAGNOSIS — M2041 Other hammer toe(s) (acquired), right foot: Secondary | ICD-10-CM | POA: Diagnosis not present

## 2022-04-29 DIAGNOSIS — M249 Joint derangement, unspecified: Secondary | ICD-10-CM | POA: Diagnosis not present

## 2022-04-29 DIAGNOSIS — M2042 Other hammer toe(s) (acquired), left foot: Secondary | ICD-10-CM | POA: Diagnosis not present

## 2022-04-29 DIAGNOSIS — M792 Neuralgia and neuritis, unspecified: Secondary | ICD-10-CM | POA: Diagnosis not present

## 2022-04-29 DIAGNOSIS — M76822 Posterior tibial tendinitis, left leg: Secondary | ICD-10-CM | POA: Diagnosis not present

## 2022-04-29 DIAGNOSIS — M2142 Flat foot [pes planus] (acquired), left foot: Secondary | ICD-10-CM | POA: Diagnosis not present

## 2022-04-30 DIAGNOSIS — Z7960 Long term (current) use of unspecified immunomodulators and immunosuppressants: Secondary | ICD-10-CM | POA: Diagnosis not present

## 2022-04-30 DIAGNOSIS — M0609 Rheumatoid arthritis without rheumatoid factor, multiple sites: Secondary | ICD-10-CM | POA: Diagnosis not present

## 2022-04-30 DIAGNOSIS — M170 Bilateral primary osteoarthritis of knee: Secondary | ICD-10-CM | POA: Diagnosis not present

## 2022-05-20 DIAGNOSIS — M2042 Other hammer toe(s) (acquired), left foot: Secondary | ICD-10-CM | POA: Diagnosis not present

## 2022-05-20 DIAGNOSIS — M216X1 Other acquired deformities of right foot: Secondary | ICD-10-CM | POA: Diagnosis not present

## 2022-05-20 DIAGNOSIS — M2041 Other hammer toe(s) (acquired), right foot: Secondary | ICD-10-CM | POA: Diagnosis not present

## 2022-05-20 DIAGNOSIS — Z8739 Personal history of other diseases of the musculoskeletal system and connective tissue: Secondary | ICD-10-CM | POA: Diagnosis not present

## 2022-05-20 DIAGNOSIS — M792 Neuralgia and neuritis, unspecified: Secondary | ICD-10-CM | POA: Diagnosis not present

## 2022-05-20 DIAGNOSIS — M2142 Flat foot [pes planus] (acquired), left foot: Secondary | ICD-10-CM | POA: Diagnosis not present

## 2022-05-20 DIAGNOSIS — M2141 Flat foot [pes planus] (acquired), right foot: Secondary | ICD-10-CM | POA: Diagnosis not present

## 2022-05-20 DIAGNOSIS — M79674 Pain in right toe(s): Secondary | ICD-10-CM | POA: Diagnosis not present

## 2022-05-20 DIAGNOSIS — M249 Joint derangement, unspecified: Secondary | ICD-10-CM | POA: Diagnosis not present

## 2022-05-20 DIAGNOSIS — M2012 Hallux valgus (acquired), left foot: Secondary | ICD-10-CM | POA: Diagnosis not present

## 2022-05-20 DIAGNOSIS — M76822 Posterior tibial tendinitis, left leg: Secondary | ICD-10-CM | POA: Diagnosis not present

## 2022-05-20 DIAGNOSIS — M2011 Hallux valgus (acquired), right foot: Secondary | ICD-10-CM | POA: Diagnosis not present

## 2022-06-06 DIAGNOSIS — M0609 Rheumatoid arthritis without rheumatoid factor, multiple sites: Secondary | ICD-10-CM | POA: Diagnosis not present

## 2022-06-12 ENCOUNTER — Ambulatory Visit: Payer: Self-pay

## 2022-06-12 DIAGNOSIS — E782 Mixed hyperlipidemia: Secondary | ICD-10-CM

## 2022-06-12 DIAGNOSIS — I10 Essential (primary) hypertension: Secondary | ICD-10-CM

## 2022-06-12 NOTE — Patient Instructions (Signed)
Thank you for allowing the  Care Management team to participate in your care.  It was great speaking with you today!  Please don't hesitate to contact the clinic if you require additional outreach.    [facility] [phone]

## 2022-06-12 NOTE — Chronic Care Management (AMB) (Signed)
      06/12/2022 Name: [name] MRN: [id number] DOB: 1946-03-29  Subjective: Holly Green is a 77 y.o. year old female who is a primary care patient of Delsa Grana, [place].   Documentation encounter created to complete case transition. Care goals addressed. The care management team will continue to follow for care coordination as needed.   [place] Management [phone]

## 2022-06-21 DIAGNOSIS — B351 Tinea unguium: Secondary | ICD-10-CM | POA: Diagnosis not present

## 2022-06-21 DIAGNOSIS — M79675 Pain in left toe(s): Secondary | ICD-10-CM | POA: Diagnosis not present

## 2022-06-21 DIAGNOSIS — M79674 Pain in right toe(s): Secondary | ICD-10-CM | POA: Diagnosis not present

## 2022-06-21 DIAGNOSIS — Z8739 Personal history of other diseases of the musculoskeletal system and connective tissue: Secondary | ICD-10-CM | POA: Diagnosis not present

## 2022-06-21 DIAGNOSIS — M249 Joint derangement, unspecified: Secondary | ICD-10-CM | POA: Diagnosis not present

## 2022-06-21 DIAGNOSIS — M2011 Hallux valgus (acquired), right foot: Secondary | ICD-10-CM | POA: Diagnosis not present

## 2022-06-21 DIAGNOSIS — M792 Neuralgia and neuritis, unspecified: Secondary | ICD-10-CM | POA: Diagnosis not present

## 2022-06-21 DIAGNOSIS — L851 Acquired keratosis [keratoderma] palmaris et plantaris: Secondary | ICD-10-CM | POA: Diagnosis not present

## 2022-06-21 DIAGNOSIS — M216X1 Other acquired deformities of right foot: Secondary | ICD-10-CM | POA: Diagnosis not present

## 2022-06-21 DIAGNOSIS — M2041 Other hammer toe(s) (acquired), right foot: Secondary | ICD-10-CM | POA: Diagnosis not present

## 2022-06-21 DIAGNOSIS — L84 Corns and callosities: Secondary | ICD-10-CM | POA: Diagnosis not present

## 2022-06-21 DIAGNOSIS — M76822 Posterior tibial tendinitis, left leg: Secondary | ICD-10-CM | POA: Diagnosis not present

## 2022-06-21 DIAGNOSIS — M2141 Flat foot [pes planus] (acquired), right foot: Secondary | ICD-10-CM | POA: Diagnosis not present

## 2022-07-04 DIAGNOSIS — M0609 Rheumatoid arthritis without rheumatoid factor, multiple sites: Secondary | ICD-10-CM | POA: Diagnosis not present

## 2022-07-05 ENCOUNTER — Ambulatory Visit: Payer: Medicare Other | Admitting: Internal Medicine

## 2022-07-10 ENCOUNTER — Ambulatory Visit: Payer: Self-pay

## 2022-07-10 NOTE — Patient Outreach (Signed)
  Care Coordination   07/10/2022 Name: Holly Green MRN: [id number] DOB: 1946-08-31   Care Coordination Outreach Attempts:  An unsuccessful telephone outreach was attempted today to offer the patient information about available care coordination services as a benefit of their health plan.   Follow Up Plan:  Additional outreach attempts will be made to offer the patient care coordination information and services.   Encounter Outcome:  No Answer  Care Coordination Interventions Activated:  No   Care Coordination Interventions:  No, not indicated   Noreene Larsson RN, MSN, CCM Community Care Coordinator [place] [facility] Mobile: [phone]

## 2022-07-12 ENCOUNTER — Ambulatory Visit (INDEPENDENT_AMBULATORY_CARE_PROVIDER_SITE_OTHER): Payer: Medicare Other | Admitting: Internal Medicine

## 2022-07-12 ENCOUNTER — Encounter: Payer: Self-pay | Admitting: Internal Medicine

## 2022-07-12 VITALS — BP 126/86 | HR 76 | Resp 16 | Ht 66.0 in | Wt 196.0 lb

## 2022-07-12 DIAGNOSIS — I10 Essential (primary) hypertension: Secondary | ICD-10-CM

## 2022-07-12 DIAGNOSIS — E782 Mixed hyperlipidemia: Secondary | ICD-10-CM

## 2022-07-12 DIAGNOSIS — R7303 Prediabetes: Secondary | ICD-10-CM

## 2022-07-12 MED ORDER — LOVASTATIN 40 MG PO TABS: 40.0000 mg | ORAL_TABLET | Freq: Every day | ORAL | 1 refills | Status: DC

## 2022-07-12 MED ORDER — LOSARTAN POTASSIUM 50 MG PO TABS: 50.0000 mg | ORAL_TABLET | Freq: Every day | ORAL | 1 refills | Status: DC

## 2022-07-12 MED ORDER — BISOPROLOL-HYDROCHLOROTHIAZIDE 10-6.25 MG PO TABS: 1.0000 | ORAL_TABLET | Freq: Every day | ORAL | 1 refills | Status: DC

## 2022-07-12 NOTE — Progress Notes (Signed)
Established Patient Office Visit  Subjective:  Patient ID: Holly Green, female    DOB: 11/28/1945  Age: 76 y.o. MRN: [id number]  CC:  Chief Complaint  Patient presents with   Follow-up    HPI Holly Green presents for follow up on chronic medical conditions.   Hypertension: -Medications: Bisoprolol-HCTZ 10-6.25 mg, Losartan 50 mg -Patient is compliant with above medications and reports no side effects. -Checking BP at home (average): average about 140 at home  -Denies any SOB, CP, vision changes, LE edema or symptoms of hypotension  HLD: -Medications: Lovastatin 40 mg -Patient is compliant with above medications and reports no side effects.  -Last lipid panel: 8/22 Lipid Panel     Component Value Date/Time   CHOL 161 06/29/2021 0958   CHOL 156 10/15/2019 1205   TRIG 40 06/29/2021 0958   HDL 81 06/29/2021 0958   HDL 79 10/15/2019 1205   CHOLHDL 2.0 06/29/2021 0958   VLDL 8 06/06/2017 0938   LDLCALC 69 06/29/2021 0958   LABVLDL 10 10/15/2019 1205   Hx of Pre-Diabetes: -Last A1c 5.0 8/22 -Not currently on any medications   GERD: -Not currently on meds, indigestion rarely   RA: -Currently on Methotrexate, Certolizumab injections as well  -Following with Rheumatology, no changes to treatment  Health Maintenance: -Blood work Due -Mammogram 1/23 [place] 1 -Colonoscopy 5/21: negative  -Tdap due - discussed and patient will get at her pharmacy    Past Medical History:  Diagnosis Date   Abdominal aortic atherosclerosis ([place]) 03/05/2017   CT scan April 2018   Acute cystitis with hematuria 09/29/2020   Arthritis    Bleeding disorder ([place])    Cardiac murmur 05/07/2016   Chondrocalcinosis 07/17/2018   Last Assessment & Plan:  Mild lateral compartment OA chondrocalcinosis   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, of knee, 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   COAD (chronic obstructive airways disease) ([place]) 08/03/2018   Diverticulitis 2015   Gastrointestinal hemorrhage  12/29/2019   GERD (gastroesophageal reflux disease)    GI bleeding 05/25/2021   History of GI bleed 11/06/2017   History of kidney stones    History of stomach ulcers    Hyperlipidemia    Hypertension    Left hip pain 10/15/2019   Osteoporosis    Pre-diabetes    Prediabetes 10/15/2015   Simple cyst of kidney 03/05/2017   CT scan April 2018   Spondylolisthesis at L4-L5 level 03/07/2017    Past Surgical History:  Procedure Laterality Date   ABDOMINAL HYSTERECTOMY     BACK SURGERY  2010   BREAST CYST ASPIRATION Right 1990's   COLONOSCOPY Left 11/09/2017   Procedure: COLONOSCOPY;  Surgeon: Virgel Manifold, MD;  Location: [facility] ENDOSCOPY;  Service: Endoscopy;  Laterality: Left;   COLONOSCOPY WITH PROPOFOL N/A 04/14/2020   Procedure: COLONOSCOPY WITH PROPOFOL;  Surgeon: Virgel Manifold, MD;  Location: [facility] ENDOSCOPY;  Service: Endoscopy;  Laterality: N/A;   CYSTO WITH HYDRODISTENSION N/A 08/15/2017   Procedure: CYSTOSCOPY/HYDRODISTENSION;  Surgeon: Nickie Retort, MD;  Location: [facility] ORS;  Service: Urology;  Laterality: N/A;   ESOPHAGOGASTRODUODENOSCOPY Left 11/09/2017   Procedure: ESOPHAGOGASTRODUODENOSCOPY (EGD);  Surgeon: Virgel Manifold, MD;  Location[facility] ENDOSCOPY;  Service: Endoscopy;  Laterality: Left;   ESOPHAGOGASTRODUODENOSCOPY (EGD) WITH PROPOFOL N/A 02/22/2020   Procedure: ESOPHAGOGASTRODUODENOSCOPY (EGD) WITH PROPOFOL;  Surgeon: Lucilla Lame, MD;  Location: [facility] ENDOSCOPY;  Service: Endoscopy;  Laterality: N/A;   FOOT SURGERY Right     Family History  Problem Relation Age of Onset  Hypertension Brother    Heart disease Brother    Heart failure Brother    Hypertension Mother    Stroke Mother    Stroke Father    Prostate cancer Father    Aneurysm Sister    Breast cancer Sister 19   Liver disease Maternal Aunt    Heart failure Paternal Aunt    Heart disease Paternal Uncle    Heart attack Brother    Heart disease Brother    Breast cancer Maternal  Aunt    Heart disease Paternal Aunt    Kidney cancer Neg Hx    Bladder Cancer Neg Hx     Social History   Socioeconomic History   Marital status: Single    Spouse name: Not on file   Number of children: 1   Years of education: Not on file   Highest education level: High school graduate  Occupational History   Occupation: retired  Tobacco Use   Smoking status: Never   Smokeless tobacco: Never  Vaping Use   Vaping Use: Never used  Substance and Sexual Activity   Alcohol use: No    Alcohol/week: 0.0 standard drinks of alcohol   Drug use: No   Sexual activity: Never  Other Topics Concern   Not on file  Social History Narrative   Pt's nephew lives with her   Works part time at [employer] Strain: [place]  (02/26/2022)   Overall Financial Resource Strain ([place])    Difficulty of Paying Living Expenses: Not hard at all  [place]: No [place] (03/06/2022)   Hunger Vital Sign    Worried About Running Out of Food in the Last Year: Never true    Ran Out of Food in the Last Year: Never true  Transportation Needs: No Transportation Needs (03/06/2022)   PRAPARE - Hydrologist (Medical): No    Lack of Transportation (Non-Medical): No  Physical Activity: Inactive (02/26/2022)   Exercise Vital Sign    Days of Exercise per Week: 0 days    Minutes of Exercise per Session: 0 min  Stress: No Stress Concern Present (02/26/2022)   [place]    Feeling of Stress : Not at all  Social Connections: Moderately Isolated (02/26/2022)   Social Connection and Isolation Panel [NHANES]    Frequency of Communication with Friends and Family: More than three times a week    Frequency of Social Gatherings with Friends and Family: Once a week    Attends Religious Services: More than 4 times per year    Active Member of [employer] or Organizations: No     Attends Archivist Meetings: Never    Marital Status: Never married  Intimate Partner Violence: Not At Risk (02/26/2022)   Humiliation, Afraid, Rape, and Kick questionnaire    Fear of Current or Ex-Partner: No    Emotionally Abused: No    Physically Abused: No    Sexually Abused: No    Outpatient Medications Prior to Visit  Medication Sig Dispense Refill   acetaminophen (TYLENOL) 650 MG CR tablet Take 650-1,300 mg by mouth every 8 (eight) hours as needed for pain.     bisoprolol-hydrochlorothiazide (ZIAC) 10-6.25 MG tablet Take 1 tablet by mouth daily. 90 tablet 3   Calcium Carbonate (CALCIUM 600 PO) Take 1,200 mg by mouth daily.     Certolizumab Pegol 2 X 200 MG/ML  PSKT Inject 400 mg into the skin every 28 (twenty-eight) days.     Cholecalciferol (VITAMIN D) 50 MCG (2000 UT) CAPS Take by mouth.     clotrimazole (CLOTRIMAZOLE AF) 1 % cream Apply 1 application. topically 2 (two) times daily. 30 g 0   ferrous sulfate (FERROUSUL) 325 (65 FE) MG tablet Take 1 tablet daily. 90 tablet 2   folic acid (FOLVITE) 1 MG tablet Take 1 tablet by mouth daily.     loratadine (CLARITIN) 10 MG tablet Take 1 tablet (10 mg total) by mouth daily. 30 tablet 11   losartan (COZAAR) 50 MG tablet Take 1 tablet (50 mg total) by mouth daily. 90 tablet 3   lovastatin (MEVACOR) 40 MG tablet Take 1 tablet (40 mg total) by mouth at bedtime. 90 tablet 3   methotrexate 2.5 MG tablet Take 10 mg by mouth once a week. Pt takes every sunday     mometasone (ELOCON) 0.1 % cream Apply to affected skin twice a day prn 45 g 2   polyethylene glycol (MIRALAX) 17 g packet Take 17 g by mouth daily. 30 each 2   No facility-administered medications prior to visit.    Allergies  Allergen Reactions   Aspirin Other (See Comments)    Diverticulosis   Lisinopril Cough    ROS Review of Systems  Constitutional:  Negative for chills and fever.  Eyes:  Negative for visual disturbance.  Respiratory:  Negative for cough and  shortness of breath.   Cardiovascular:  Negative for chest pain.  Neurological:  Negative for dizziness and headaches.      Objective:    Physical Exam Constitutional:      Appearance: Normal appearance.  HENT:     Head: Normocephalic and atraumatic.  Eyes:     Conjunctiva/sclera: Conjunctivae normal.  Cardiovascular:     Rate and Rhythm: Normal rate and regular rhythm.  Pulmonary:     Effort: Pulmonary effort is normal.     Breath sounds: Normal breath sounds.  Musculoskeletal:     Right lower leg: No edema.     Left lower leg: No edema.  Skin:    General: Skin is warm and dry.  Neurological:     General: No focal deficit present.     Mental Status: She is alert. Mental status is at baseline.  Psychiatric:        Mood and Affect: Mood normal.        Behavior: Behavior normal.     BP 126/86   Pulse 76   Resp 16   Ht '5\' 6"'  (1.676 m)   Wt 196 lb (88.9 kg)   LMP  (LMP Unknown)   SpO2 98%   BMI 31.64 kg/m  Wt Readings from Last 3 Encounters:  07/12/22 196 lb (88.9 kg)  04/09/22 194 lb 4.8 oz (88.1 kg)  02/26/22 194 lb 12.8 oz (88.4 kg)    Health Maintenance Due  Topic Date Due   Zoster Vaccines- Shingrix (1 of 2) Never done   COVID-19 Vaccine (4 - Pfizer risk series) 11/06/2020   TETANUS/TDAP  06/15/2022   INFLUENZA VACCINE  06/25/2022    There are no preventive care reminders to display for this patient.  Lab Results  Component Value Date   TSH 1.770 10/12/2015   Lab Results  Component Value Date   WBC 4.9 04/08/2022   HGB 12.0 04/08/2022   HCT 36.1 04/08/2022   MCV 95.5 04/08/2022   PLT 239 04/08/2022   Lab Results  Component Value Date   NA 138 10/05/2021   K 3.6 10/05/2021   CO2 27 10/05/2021   GLUCOSE 103 (H) 10/05/2021   BUN 20 10/05/2021   CREATININE 0.59 10/05/2021   BILITOT 1.5 (H) 10/05/2021   ALKPHOS 79 10/05/2021   AST 30 10/05/2021   ALT 22 10/05/2021   PROT 7.3 10/05/2021   ALBUMIN 4.0 10/05/2021   CALCIUM 8.9 10/05/2021    ANIONGAP 8 10/05/2021   EGFR 80 06/29/2021   Lab Results  Component Value Date   CHOL 161 06/29/2021   Lab Results  Component Value Date   HDL 81 06/29/2021   Lab Results  Component Value Date   LDLCALC 69 06/29/2021   Lab Results  Component Value Date   TRIG 40 06/29/2021   Lab Results  Component Value Date   CHOLHDL 2.0 06/29/2021   Lab Results  Component Value Date   HGBA1C 5.0 06/29/2021      Assessment & Plan:   1. Essential hypertension: Stable, blood pressure at goal today. Continue current regimen of Bisoprolol-HCTZ 10-25 mg and Losartan 50 mg daily, refilled today. Follow up in 6 months.   - CBC w/Diff/Platelet - COMPLETE METABOLIC PANEL WITH GFR - bisoprolol-hydrochlorothiazide (ZIAC) 10-6.25 MG tablet; Take 1 tablet by mouth daily.  Dispense: 90 tablet; Refill: 1 - losartan (COZAAR) 50 MG tablet; Take 1 tablet (50 mg total) by mouth daily.  Dispense: 90 tablet; Refill: 1  2. Mixed hyperlipidemia: Chronic, recheck labs today. Continue Lovastatin 40 mg, refilled today.  - Lipid Profile - lovastatin (MEVACOR) 40 MG tablet; Take 1 tablet (40 mg total) by mouth at bedtime.  Dispense: 90 tablet; Refill: 1  3. Prediabetes: Recheck A1c with above labs.   - HgB A1c   Follow-up: Return in about 6 months (around 01/12/2023).    Teodora Medici, DO

## 2022-07-12 NOTE — Patient Instructions (Addendum)
It was great seeing you today!  Plan discussed at today's visit: -Blood work ordered today, results will be uploaded to [facility].  -Medications refilled today -Recommend getting Tdap vaccine at your pharmacy   Follow up in: 6 months   Take care and let us know if you have any questions or concerns prior to your next visit.  Dr. Rosana Berger

## 2022-07-13 LAB — COMPLETE METABOLIC PANEL WITH GFR
AG Ratio: 1.6 (calc) (ref 1.0–2.5)
ALT: 33 U/L — ABNORMAL HIGH (ref 6–29)
AST: 34 U/L (ref 10–35)
Albumin: 4.2 g/dL (ref 3.6–5.1)
Alkaline phosphatase (APISO): 81 U/L (ref 37–153)
BUN: 21 mg/dL (ref 7–25)
CO2: 27 mmol/L (ref 20–32)
Calcium: 9.3 mg/dL (ref 8.6–10.4)
Chloride: 103 mmol/L (ref 98–110)
Creat: 0.76 mg/dL (ref 0.60–1.00)
Globulin: 2.7 g/dL (calc) (ref 1.9–3.7)
Glucose, Bld: 84 mg/dL (ref 65–99)
Potassium: 3.7 mmol/L (ref 3.5–5.3)
Sodium: 140 mmol/L (ref 135–146)
Total Bilirubin: 1.1 mg/dL (ref 0.2–1.2)
Total Protein: 6.9 g/dL (ref 6.1–8.1)
eGFR: 82 mL/min/{1.73_m2} (ref >=60)

## 2022-07-13 LAB — CBC WITH DIFFERENTIAL/PLATELET
Absolute Monocytes: 545 cells/uL (ref 200–950)
Basophils Absolute: 20 cells/uL (ref 0–200)
Basophils Relative: 0.4 %
Eosinophils Absolute: 130 cells/uL (ref 15–500)
Eosinophils Relative: 2.6 %
HCT: 35.1 % (ref 35.0–45.0)
Hemoglobin: 11.8 g/dL (ref 11.7–15.5)
Lymphs Abs: 1920 cells/uL (ref 850–3900)
MCH: 31.7 pg (ref 27.0–33.0)
MCHC: 33.6 g/dL (ref 32.0–36.0)
MCV: 94.4 fL (ref 80.0–100.0)
MPV: 8.8 fL (ref 7.5–12.5)
Monocytes Relative: 10.9 %
Neutro Abs: 2385 cells/uL (ref 1500–7800)
Neutrophils Relative %: 47.7 %
Platelets: 235 10*3/uL (ref 140–400)
RBC: 3.72 10*6/uL — ABNORMAL LOW (ref 3.80–5.10)
RDW: 12.4 % (ref 11.0–15.0)
Total Lymphocyte: 38.4 %
WBC: 5 10*3/uL (ref 3.8–10.8)

## 2022-07-13 LAB — LIPID PANEL
Cholesterol: 154 mg/dL (ref <200)
HDL: 76 mg/dL (ref >=50)
LDL Cholesterol (Calc): 65 mg/dL (calc)
Non-HDL Cholesterol (Calc): 78 mg/dL (calc) (ref <130)
Total CHOL/HDL Ratio: 2 (calc) (ref <5.0)
Triglycerides: 58 mg/dL (ref <150)

## 2022-07-13 LAB — HEMOGLOBIN A1C
Hgb A1c MFr Bld: 5.8 % of total Hgb — ABNORMAL HIGH (ref <5.7)
Mean Plasma Glucose: 120 mg/dL
eAG (mmol/L): 6.6 mmol/L

## 2022-07-19 DIAGNOSIS — M76822 Posterior tibial tendinitis, left leg: Secondary | ICD-10-CM | POA: Diagnosis not present

## 2022-07-26 DIAGNOSIS — M76822 Posterior tibial tendinitis, left leg: Secondary | ICD-10-CM | POA: Diagnosis not present

## 2022-08-01 DIAGNOSIS — M0609 Rheumatoid arthritis without rheumatoid factor, multiple sites: Secondary | ICD-10-CM | POA: Diagnosis not present

## 2022-08-02 DIAGNOSIS — M76822 Posterior tibial tendinitis, left leg: Secondary | ICD-10-CM | POA: Diagnosis not present

## 2022-08-09 DIAGNOSIS — M76822 Posterior tibial tendinitis, left leg: Secondary | ICD-10-CM | POA: Diagnosis not present

## 2022-08-12 DIAGNOSIS — M2041 Other hammer toe(s) (acquired), right foot: Secondary | ICD-10-CM | POA: Diagnosis not present

## 2022-08-12 DIAGNOSIS — M216X1 Other acquired deformities of right foot: Secondary | ICD-10-CM | POA: Diagnosis not present

## 2022-08-12 DIAGNOSIS — M76822 Posterior tibial tendinitis, left leg: Secondary | ICD-10-CM | POA: Diagnosis not present

## 2022-08-12 DIAGNOSIS — M249 Joint derangement, unspecified: Secondary | ICD-10-CM | POA: Diagnosis not present

## 2022-08-12 DIAGNOSIS — M2142 Flat foot [pes planus] (acquired), left foot: Secondary | ICD-10-CM | POA: Diagnosis not present

## 2022-08-12 DIAGNOSIS — M2011 Hallux valgus (acquired), right foot: Secondary | ICD-10-CM | POA: Diagnosis not present

## 2022-08-12 DIAGNOSIS — L84 Corns and callosities: Secondary | ICD-10-CM | POA: Diagnosis not present

## 2022-08-12 DIAGNOSIS — B351 Tinea unguium: Secondary | ICD-10-CM | POA: Diagnosis not present

## 2022-08-12 DIAGNOSIS — Z8739 Personal history of other diseases of the musculoskeletal system and connective tissue: Secondary | ICD-10-CM | POA: Diagnosis not present

## 2022-08-12 DIAGNOSIS — M2141 Flat foot [pes planus] (acquired), right foot: Secondary | ICD-10-CM | POA: Diagnosis not present

## 2022-08-12 DIAGNOSIS — M79674 Pain in right toe(s): Secondary | ICD-10-CM | POA: Diagnosis not present

## 2022-08-12 DIAGNOSIS — M792 Neuralgia and neuritis, unspecified: Secondary | ICD-10-CM | POA: Diagnosis not present

## 2022-08-16 DIAGNOSIS — M76822 Posterior tibial tendinitis, left leg: Secondary | ICD-10-CM | POA: Diagnosis not present

## 2022-08-26 ENCOUNTER — Encounter: Payer: Self-pay | Admitting: Gastroenterology

## 2022-08-26 ENCOUNTER — Ambulatory Visit (INDEPENDENT_AMBULATORY_CARE_PROVIDER_SITE_OTHER): Payer: Medicare Other | Admitting: Gastroenterology

## 2022-08-26 VITALS — BP 170/92 | HR 73 | Temp 98.0°F | Ht 66.0 in | Wt 202.4 lb

## 2022-08-26 DIAGNOSIS — K5730 Diverticulosis of large intestine without perforation or abscess without bleeding: Secondary | ICD-10-CM | POA: Diagnosis not present

## 2022-08-26 NOTE — Progress Notes (Signed)
Cephas Darby, MD [address]  [place]  [place], [place] [address]  Main: [phone]  Fax: [phone]    Gastroenterology Consultation  Referring Provider:     Delsa Grana, PA-C Primary Care Physician:  Delsa Grana, PA-C Primary Gastroenterologist:  Dr. Bonna Gains Reason for Consultation: History of diverticulitis        HPI:   Holly Green is a 76 y.o. female referred by Delsa Grana, PA-C  for consultation & management of history of diverticulitis.  The patient has colonic diverticulosis and intermittent rectal bleeding.  Patient reports that she has been feeling well last several months.  She had small amount of bright red blood per rectum that resolved on its own.  Her hemoglobin has been normal.  She wanted to establish care with me since Dr. Bonna Gains left the practice Patient does not have any GI concerns today  NSAIDs: None  Antiplts/Anticoagulants/Anti thrombotics: None  GI Procedures:  Upper endoscopy 02/22/2020 - Small hiatal hernia. - Normal stomach. - Normal examined duodenum. - No specimens collected. - No cuase for the patient pain seen.  Colonoscopy 04/14/2020 - Diverticulosis in the entire examined colon. - The examination was otherwise normal. - The rectum, sigmoid colon, descending colon, transverse colon, ascending colon and cecum are normal. - The distal rectum and anal verge are normal on retroflexion view. - No specimens collected.  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   COAD (chronic obstructive airways disease) ([place]) 08/03/2018   Diverticulitis 2015   Gastrointestinal  hemorrhage 12/29/2019   GERD (gastroesophageal reflux disease)    GI bleeding 05/25/2021   History of GI bleed 11/06/2017   History of kidney stones    History of stomach ulcers    Hyperlipidemia    Hypertension    Left hip pain 10/15/2019   Osteoporosis    Pre-diabetes    Prediabetes 10/15/2015   Simple cyst of kidney 03/05/2017   CT scan April 2018   Spondylolisthesis at L4-L5 level 03/07/2017    Past Surgical History:  Procedure Laterality Date   ABDOMINAL HYSTERECTOMY     BACK SURGERY  2010   BREAST CYST ASPIRATION Right 1990's   COLONOSCOPY Left 11/09/2017   Procedure: COLONOSCOPY;  Surgeon: Virgel Manifold, MD;  Location: [facility] ENDOSCOPY;  Service: Endoscopy;  Laterality: Left;   COLONOSCOPY WITH PROPOFOL N/A 04/14/2020   Procedure: COLONOSCOPY WITH PROPOFOL;  Surgeon: Virgel Manifold, MD;  Location: [facility] ENDOSCOPY;  Service: Endoscopy;  Laterality: N/A;   CYSTO WITH HYDRODISTENSION N/A 08/15/2017   Procedure: CYSTOSCOPY/HYDRODISTENSION;  Surgeon: Nickie Retort, MD;  Location: [facility] ORS;  Service: Urology;  Laterality: N/A;   ESOPHAGOGASTRODUODENOSCOPY Left 11/09/2017   Procedure: ESOPHAGOGASTRODUODENOSCOPY (EGD);  Surgeon: Virgel Manifold, MD;  Location[facility];  Service: Endoscopy;  Laterality: Left;   ESOPHAGOGASTRODUODENOSCOPY (EGD) WITH PROPOFOL N/A 02/22/2020   Procedure: ESOPHAGOGASTRODUODENOSCOPY (EGD) WITH PROPOFOL;  Surgeon: Lucilla Lame, MD;  Location: [facility] ENDOSCOPY;  Service: Endoscopy;  Laterality: N/A;   FOOT SURGERY Right      Current Outpatient Medications:    acetaminophen (TYLENOL) 650 MG CR tablet, Take 650-1,300 mg by mouth every 8 (  eight) hours as needed for pain., Disp: , Rfl:    bisoprolol-hydrochlorothiazide (ZIAC) 10-6.25 MG tablet, Take 1 tablet by mouth daily., Disp: 90 tablet, Rfl: 1   Calcium Carbonate (CALCIUM 600 PO), Take 1,200 mg by mouth daily., Disp: , Rfl:    Certolizumab Pegol 2 X 200 MG/ML PSKT, Inject 400 mg into  the skin every 28 (twenty-eight) days., Disp: , Rfl:    Cholecalciferol (VITAMIN D) 50 MCG (2000 UT) CAPS, Take by mouth., Disp: , Rfl:    clotrimazole (CLOTRIMAZOLE AF) 1 % cream, Apply 1 application. topically 2 (two) times daily., Disp: 30 g, Rfl: 0   ferrous sulfate (FERROUSUL) 325 (65 FE) MG tablet, Take 1 tablet daily., Disp: 90 tablet, Rfl: 2   folic acid (FOLVITE) 1 MG tablet, Take 1 tablet by mouth daily., Disp: , Rfl:    loratadine (CLARITIN) 10 MG tablet, Take 1 tablet (10 mg total) by mouth daily., Disp: 30 tablet, Rfl: 11   losartan (COZAAR) 50 MG tablet, Take 1 tablet (50 mg total) by mouth daily., Disp: 90 tablet, Rfl: 1   lovastatin (MEVACOR) 40 MG tablet, Take 1 tablet (40 mg total) by mouth at bedtime., Disp: 90 tablet, Rfl: 1   methotrexate 2.5 MG tablet, Take 10 mg by mouth once a week. Pt takes every sunday, Disp: , Rfl:    mometasone (ELOCON) 0.1 % cream, Apply to affected skin twice a day prn, Disp: 45 g, Rfl: 2   polyethylene glycol (MIRALAX) 17 g packet, Take 17 g by mouth daily., Disp: 30 each, Rfl: 2   Family History  Problem Relation Age of Onset   Hypertension Brother    Heart disease Brother    Heart failure Brother    Hypertension Mother    Stroke Mother    Stroke Father    Prostate cancer Father    Aneurysm Sister    Breast cancer Sister 89   Liver disease Maternal Aunt    Heart failure Paternal Aunt    Heart disease Paternal Uncle    Heart attack Brother    Heart disease Brother    Breast cancer Maternal Aunt    Heart disease Paternal Aunt    Kidney cancer Neg Hx    Bladder Cancer Neg Hx      Social History   Tobacco Use   Smoking status: Never   Smokeless tobacco: Never  Vaping Use   Vaping Use: Never used  Substance Use Topics   Alcohol use: No    Alcohol/week: 0.0 standard drinks of alcohol   Drug use: No    Allergies as of 08/26/2022 - Review Complete 08/26/2022  Allergen Reaction Noted   Aspirin Other (See Comments) 08/08/2017    Lisinopril Cough 06/14/2019    Review of Systems:    All systems reviewed and negative except where noted in HPI.   Physical Exam:  BP (!) 170/92 (BP Location: Left Arm, Patient Position: Sitting, Cuff Size: Normal)   Pulse 73   Temp 98 F (36.7 C) (Oral)   Ht '5\' 6"'$  (1.676 m)   Wt 202 lb 6 oz (91.8 kg)   LMP  (LMP Unknown)   BMI 32.66 kg/m  No LMP recorded (lmp unknown). Patient has had a hysterectomy.  General:   Alert,  Well-developed, well-nourished, pleasant and cooperative in NAD Head:  Normocephalic and atraumatic. Eyes:  Sclera clear, no icterus.   Conjunctiva pink. Ears:  Normal auditory acuity. Nose:  No deformity, discharge, or lesions. Mouth:  No deformity  or lesions,oropharynx pink & moist. Neck:  Supple; no masses or thyromegaly. Lungs:  Respirations even and unlabored.  Clear throughout to auscultation.   No wheezes, crackles, or rhonchi. No acute distress. Heart:  Regular rate and rhythm; no murmurs, clicks, rubs, or gallops. Abdomen:  Normal bowel sounds. Soft, non-tender and non-distended without masses, hepatosplenomegaly or hernias noted.  No guarding or rebound tenderness.   Rectal: Not performed Msk:  Symmetrical without gross deformities. Good, equal movement & strength bilaterally. Pulses:  Normal pulses noted. Extremities:  No clubbing or edema.  No cyanosis. Neurologic:  Alert and oriented x3;  grossly normal neurologically. Skin:  Intact without significant lesions or rashes. No jaundice. Psych:  Alert and cooperative. Normal mood and affect.  Imaging Studies: Reviewed  Assessment and Plan:   Holly Green is a 76 y.o. female with history of rheumatoid arthritis on Cimzia and methotrexate, colonic diverticulosis, history of diverticulitis and rectal bleeding.  Patient is no longer anemic, her rectal bleeding has resolved.  She does not have any GI symptoms today Advised patient to continue high-fiber diet, adequate intake of water, a bowel regimen  as needed to keep bowels regular.    Follow up as needed   Cephas Darby, MD

## 2022-09-02 DIAGNOSIS — M25531 Pain in right wrist: Secondary | ICD-10-CM | POA: Diagnosis not present

## 2022-09-02 DIAGNOSIS — M654 Radial styloid tenosynovitis [de Quervain]: Secondary | ICD-10-CM | POA: Diagnosis not present

## 2022-09-02 DIAGNOSIS — Z7960 Long term (current) use of unspecified immunomodulators and immunosuppressants: Secondary | ICD-10-CM | POA: Diagnosis not present

## 2022-09-02 DIAGNOSIS — M0609 Rheumatoid arthritis without rheumatoid factor, multiple sites: Secondary | ICD-10-CM | POA: Diagnosis not present

## 2022-09-02 DIAGNOSIS — M170 Bilateral primary osteoarthritis of knee: Secondary | ICD-10-CM | POA: Diagnosis not present

## 2022-09-30 DIAGNOSIS — M0609 Rheumatoid arthritis without rheumatoid factor, multiple sites: Secondary | ICD-10-CM | POA: Diagnosis not present

## 2022-10-08 ENCOUNTER — Inpatient Hospital Stay: Payer: Medicare Other | Attending: Oncology

## 2022-10-08 DIAGNOSIS — Z803 Family history of malignant neoplasm of breast: Secondary | ICD-10-CM | POA: Insufficient documentation

## 2022-10-08 DIAGNOSIS — D500 Iron deficiency anemia secondary to blood loss (chronic): Secondary | ICD-10-CM

## 2022-10-08 DIAGNOSIS — Z8042 Family history of malignant neoplasm of prostate: Secondary | ICD-10-CM | POA: Insufficient documentation

## 2022-10-08 DIAGNOSIS — E119 Type 2 diabetes mellitus without complications: Secondary | ICD-10-CM | POA: Diagnosis not present

## 2022-10-08 DIAGNOSIS — Z90710 Acquired absence of both cervix and uterus: Secondary | ICD-10-CM | POA: Insufficient documentation

## 2022-10-08 DIAGNOSIS — I10 Essential (primary) hypertension: Secondary | ICD-10-CM | POA: Diagnosis not present

## 2022-10-08 LAB — CBC WITH DIFFERENTIAL/PLATELET
Abs Immature Granulocytes: 0.02 10*3/uL (ref 0.00–0.07)
Basophils Absolute: 0 10*3/uL (ref 0.0–0.1)
Basophils Relative: 0 %
Eosinophils Absolute: 0.1 10*3/uL (ref 0.0–0.5)
Eosinophils Relative: 2 %
HCT: 37 % (ref 36.0–46.0)
Hemoglobin: 12.1 g/dL (ref 12.0–15.0)
Immature Granulocytes: 0 %
Lymphocytes Relative: 37 %
Lymphs Abs: 1.8 10*3/uL (ref 0.7–4.0)
MCH: 31.6 pg (ref 26.0–34.0)
MCHC: 32.7 g/dL (ref 30.0–36.0)
MCV: 96.6 fL (ref 80.0–100.0)
Monocytes Absolute: 0.5 10*3/uL (ref 0.1–1.0)
Monocytes Relative: 10 %
Neutro Abs: 2.4 10*3/uL (ref 1.7–7.7)
Neutrophils Relative %: 51 %
Platelets: 206 10*3/uL (ref 150–400)
RBC: 3.83 MIL/uL — ABNORMAL LOW (ref 3.87–5.11)
RDW: 13.6 % (ref 11.5–15.5)
WBC: 4.9 10*3/uL (ref 4.0–10.5)
nRBC: 0 % (ref 0.0–0.2)

## 2022-10-08 LAB — IRON AND TIBC
Iron: 88 ug/dL (ref 28–170)
Saturation Ratios: 23 % (ref 10.4–31.8)
TIBC: 382 ug/dL (ref 250–450)
UIBC: 294 ug/dL

## 2022-10-08 LAB — FERRITIN: Ferritin: 43 ng/mL (ref 11–307)

## 2022-10-09 ENCOUNTER — Ambulatory Visit (INDEPENDENT_AMBULATORY_CARE_PROVIDER_SITE_OTHER): Payer: Medicare Other | Admitting: Dermatology

## 2022-10-09 DIAGNOSIS — Z79899 Other long term (current) drug therapy: Secondary | ICD-10-CM | POA: Diagnosis not present

## 2022-10-09 DIAGNOSIS — L2089 Other atopic dermatitis: Secondary | ICD-10-CM | POA: Diagnosis not present

## 2022-10-09 DIAGNOSIS — B353 Tinea pedis: Secondary | ICD-10-CM | POA: Diagnosis not present

## 2022-10-09 MED ORDER — MOMETASONE FUROATE 0.1 % EX CREA: TOPICAL_CREAM | CUTANEOUS | 2 refills | Status: AC

## 2022-10-09 MED ORDER — KETOCONAZOLE 2 % EX CREA: TOPICAL_CREAM | CUTANEOUS | 11 refills | Status: AC

## 2022-10-09 NOTE — Patient Instructions (Signed)
Due to recent changes in healthcare laws, you may see results of your pathology and/or laboratory studies on MyChart before the doctors have had a chance to review them. We understand that in some cases there may be results that are confusing or concerning to you. Please understand that not all results are received at the same time and often the doctors may need to interpret multiple results in order to provide you with the best plan of care or course of treatment. Therefore, we ask that you please give us 2 business days to thoroughly review all your results before contacting the office for clarification. Should we see a critical lab result, you will be contacted sooner.   If You Need Anything After Your Visit  If you have any questions or concerns for your doctor, please call our main line at [phone] and press option 4 to reach your doctor's medical assistant. If no one answers, please leave a voicemail as directed and we will return your call as soon as possible. Messages left after 4 pm will be answered the following business day.   You may also send us a message via MyChart. We typically respond to MyChart messages within 1-2 business days.  For prescription refills, please ask your pharmacy to contact our office. Our fax number is [phone].  If you have an urgent issue when the clinic is closed that cannot wait until the next business day, you can page your doctor at the number below.    Please note that while we do our best to be available for urgent issues outside of office hours, we are not available 24/7.   If you have an urgent issue and are unable to reach us, you may choose to seek medical care at your doctor's office, retail clinic, urgent care center, or emergency room.  If you have a medical emergency, please immediately call 911 or go to the emergency department.  Pager Numbers  - Dr. Kowalski: [phone]  - Dr. Moye: [phone]  - Dr. Stewart:  [phone]  In the event of inclement weather, please call our main line at [phone] for an update on the status of any delays or closures.  Dermatology Medication Tips: Please keep the boxes that topical medications come in in order to help keep track of the instructions about where and how to use these. Pharmacies typically print the medication instructions only on the boxes and not directly on the medication tubes.   If your medication is too expensive, please contact our office at [phone] option 4 or send us a message through MyChart.   We are unable to tell what your co-pay for medications will be in advance as this is different depending on your insurance coverage. However, we may be able to find a substitute medication at lower cost or fill out paperwork to get insurance to cover a needed medication.   If a prior authorization is required to get your medication covered by your insurance company, please allow us 1-2 business days to complete this process.  Drug prices often vary depending on where the prescription is filled and some pharmacies may offer cheaper prices.  The website www.goodrx.com contains coupons for medications through different pharmacies. The prices here do not account for what the cost may be with help from insurance (it may be cheaper with your insurance), but the website can give you the price if you did not use any insurance.  - You can print the associated coupon and take it with   your prescription to the pharmacy.  - You may also stop by our office during regular business hours and pick up a GoodRx coupon card.  - If you need your prescription sent electronically to a different pharmacy, notify our office through [facility] MyChart or by phone at [phone] option 4.     Si Usted Necesita Algo Despus de Su Visita  Tambin puede enviarnos un mensaje a travs de MyChart. Por lo general respondemos a los mensajes de MyChart en el transcurso de 1 a 2  das hbiles.  Para renovar recetas, por favor pida a su farmacia que se ponga en contacto con nuestra oficina. Nuestro nmero de fax es el [phone].  Si tiene un asunto urgente cuando la clnica est cerrada y que no puede esperar hasta el siguiente da hbil, puede llamar/localizar a su doctor(a) al nmero que aparece a continuacin.   Por favor, tenga en cuenta que aunque hacemos todo lo posible para estar disponibles para asuntos urgentes fuera del horario de oficina, no estamos disponibles las 24 horas del da, los 7 das de la semana.   Si tiene un problema urgente y no puede comunicarse con nosotros, puede optar por buscar atencin mdica  en el consultorio de su doctor(a), en una clnica privada, en un centro de atencin urgente o en una sala de emergencias.  Si tiene una emergencia mdica, por favor llame inmediatamente al 911 o vaya a la sala de emergencias.  Nmeros de bper  - Dr. Kowalski: [phone]  - Dra. Moye: [phone]  - Dra. Stewart: [phone]  En caso de inclemencias del tiempo, por favor llame a nuestra lnea principal al [phone] para una actualizacin sobre el estado de cualquier retraso o cierre.  Consejos para la medicacin en dermatologa: Por favor, guarde las cajas en las que vienen los medicamentos de uso tpico para ayudarle a seguir las instrucciones sobre dnde y cmo usarlos. Las farmacias generalmente imprimen las instrucciones del medicamento slo en las cajas y no directamente en los tubos del medicamento.   Si su medicamento es muy caro, por favor, pngase en contacto con nuestra oficina llamando al [phone] y presione la opcin 4 o envenos un mensaje a travs de MyChart.   No podemos decirle cul ser su copago por los medicamentos por adelantado ya que esto es diferente dependiendo de la cobertura de su seguro. Sin embargo, es posible que podamos encontrar un medicamento sustituto a menor costo o llenar un formulario para que el  seguro cubra el medicamento que se considera necesario.   Si se requiere una autorizacin previa para que su compaa de seguros cubra su medicamento, por favor permtanos de 1 a 2 das hbiles para completar este proceso.  Los precios de los medicamentos varan con frecuencia dependiendo del lugar de dnde se surte la receta y alguna farmacias pueden ofrecer precios ms baratos.  El sitio web www.goodrx.com tiene cupones para medicamentos de diferentes farmacias. Los precios aqu no tienen en cuenta lo que podra costar con la ayuda del seguro (puede ser ms barato con su seguro), pero el sitio web puede darle el precio si no utiliz ningn seguro.  - Puede imprimir el cupn correspondiente y llevarlo con su receta a la farmacia.  - Tambin puede pasar por nuestra oficina durante el horario de atencin regular y recoger una tarjeta de cupones de GoodRx.  - Si necesita que su receta se enve electrnicamente a una farmacia diferente, informe a nuestra oficina a travs de MyChart de [facility]   o por telfono llamando al [phone] y presione la opcin 4.  

## 2022-10-09 NOTE — Progress Notes (Signed)
   Follow-Up Visit   Subjective  Holly Green is a 76 y.o. female who presents for the following: Atopic dermatitis (Bx proven, doing well, uses Mometasone 0.1% cream PRN flares).  The following portions of the chart were reviewed this encounter and updated as appropriate:   Tobacco  Allergies  Meds  Problems  Med Hx  Surg Hx  Fam Hx     Review of Systems:  No other skin or systemic complaints except as noted in HPI or Assessment and Plan.  Objective  Well appearing patient in no apparent distress; mood and affect are within normal limits.  A focused examination was performed including the face and extremities. Relevant physical exam findings are noted in the Assessment and Plan.  B/L feet and ankles Macular spotty hyperpigmentation.  B/L foot and toenails Scaling and maceration web spaces and over distal and lateral soles. With toenail dystrophy.    Assessment & Plan  Other atopic dermatitis B/L feet and ankles  Bx proven, with PIPA, currently calm - Reviewed pathology results. Negative for tinea.   Atopic dermatitis (eczema) is a chronic, relapsing, pruritic condition that can significantly affect quality of life. It is often associated with allergic rhinitis and/or asthma and can require treatment with topical medications, phototherapy, or in severe cases biologic injectable medication (Dupixent; Adbry) or Oral JAK inhibitors.  Continue Mometasone 0.1% cream to aa's QD-BID 5d/wk until clear. Topical steroids (such as triamcinolone, fluocinolone, fluocinonide, mometasone, clobetasol, halobetasol, betamethasone, hydrocortisone) can cause thinning and lightening of the skin if they are used for too long in the same area. Your physician has selected the right strength medicine for your problem and area affected on the body. Please use your medication only as directed by your physician to prevent side effects.   Continue CeraVe cream moisturizer daily.  Ok to have pedicure.    Related Medications mometasone (ELOCON) 0.1 % cream Apply to affected skin twice a day prn up to 5d/wk until clear. Then PRN thereafter.  Tinea pedis of both feet B/L foot and toenails Chronic and persistent condition with duration or expected duration over one year. Condition is symptomatic / bothersome to patient. Not to goal.  Start Ketoconazole 2% cream to the feet and toenails QHS.   ketoconazole (NIZORAL) 2 % cream - B/L foot and toenails For fungal infection - apply to feet and toenails QHS.  Return if symptoms worsen or fail to improve.  Luther Redo, CMA, am acting as scribe for Sarina Ser, MD . Documentation: I have reviewed the above documentation for accuracy and completeness, and I agree with the above.  Sarina Ser, MD

## 2022-10-10 ENCOUNTER — Inpatient Hospital Stay (HOSPITAL_BASED_OUTPATIENT_CLINIC_OR_DEPARTMENT_OTHER): Payer: Medicare Other | Admitting: Oncology

## 2022-10-10 ENCOUNTER — Encounter: Payer: Self-pay | Admitting: Oncology

## 2022-10-10 VITALS — BP 173/91 | HR 64 | Temp 96.7°F | Resp 18 | Wt 197.6 lb

## 2022-10-10 DIAGNOSIS — E119 Type 2 diabetes mellitus without complications: Secondary | ICD-10-CM | POA: Diagnosis not present

## 2022-10-10 DIAGNOSIS — D500 Iron deficiency anemia secondary to blood loss (chronic): Secondary | ICD-10-CM | POA: Diagnosis not present

## 2022-10-10 DIAGNOSIS — Z90710 Acquired absence of both cervix and uterus: Secondary | ICD-10-CM | POA: Diagnosis not present

## 2022-10-10 DIAGNOSIS — Z8042 Family history of malignant neoplasm of prostate: Secondary | ICD-10-CM | POA: Diagnosis not present

## 2022-10-10 DIAGNOSIS — Z803 Family history of malignant neoplasm of breast: Secondary | ICD-10-CM | POA: Diagnosis not present

## 2022-10-10 DIAGNOSIS — I10 Essential (primary) hypertension: Secondary | ICD-10-CM | POA: Diagnosis not present

## 2022-10-10 NOTE — Progress Notes (Signed)
Hematology/Oncology Progress note [phone] [phone]      Patient Care Team: Delsa Grana, PA-C as PCP - General (Family Medicine) Earlie Server, MD as Consulting Physician (Hematology and Oncology) Quintin Alto, MD as Consulting Physician (Rheumatology)  REFERRING PROVIDER: Delsa Grana, PA-C CHIEF COMPLAINTS/REASON FOR VISIT:  Follow-up in iron deficiency anemia  HISTORY OF PRESENTING ILLNESS:  Holly Green is a  76 y.o.  female with PMH listed below was seen in consultation at the request of Delsa Grana, PA-C   for evaluation of iron deficiency anemia.  Patient has a remote history of diverticula bleeding status post positive bleeding scan and embolization 2009 and 2015. Patient was admitted from 12/29/2019-12/31/2019 due to GI bleeding.  Patient had a 2 g hemoglobin drop over the past 2 months prior to admission.  Hemoglobin was 10.  Iron panel showed iron deficiency with ferritin off 32 and iron saturation at 11.  Conservative measures was recommended by gastroenterology.  Patient was discharged. 02/22/2020, EGD showed small hiatal hernia, normal stomach and duodenum.  No specimen was collected. 11/09/2017, colonoscopy showed: Diverticulosis.  Otherwise normal. No recent blood work after February was done. Patient is currently taking oral iron supplementation  #  05/19/2021 for rectal bleeding and abdominal pain.  Imaging showed diverticulitis and she was treated with Augmentin and discharged home.  She was seen again on 05/25/2021 for same concern but noted to have significant drop in her hemoglobin. She was given IV Zosyn and 1 unit PRBC.  She was admitted and discharged on 05/28/2021  On methotrexate  INTERVAL HISTORY Holly Green is a 76 y.o. female who has above history reviewed by me today presents for follow up visit for management of anemia Patient reports feeling well.  No new complaints.  Review of Systems  Constitutional:  Negative for appetite  change, chills, fatigue and fever.  HENT:   Negative for hearing loss and voice change.   Eyes:  Negative for eye problems.  Respiratory:  Negative for chest tightness and cough.   Cardiovascular:  Negative for chest pain.  Gastrointestinal:  Negative for abdominal distention, abdominal pain and blood in stool.  Endocrine: Negative for hot flashes.  Genitourinary:  Negative for difficulty urinating and frequency.   Musculoskeletal:  Negative for arthralgias.  Skin:  Negative for itching and rash.  Neurological:  Negative for extremity weakness.  Hematological:  Negative for adenopathy.  Psychiatric/Behavioral:  Negative for confusion.     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   COAD (chronic obstructive airways disease) ([place]) 08/03/2018   Diverticulitis 2015   Gastrointestinal hemorrhage 12/29/2019   GERD (gastroesophageal reflux disease)    GI bleeding 05/25/2021   History of GI bleed 11/06/2017   History of kidney stones    History of stomach ulcers    Hyperlipidemia    Hypertension    Left hip pain 10/15/2019   Osteoporosis    Pre-diabetes    Prediabetes 10/15/2015   Simple cyst of kidney 03/05/2017   CT scan April 2018   Spondylolisthesis at L4-L5 level 03/07/2017    SURGICAL HISTORY: Past Surgical History:  Procedure Laterality Date   ABDOMINAL HYSTERECTOMY     BACK SURGERY  2010   BREAST CYST ASPIRATION Right 1990's   COLONOSCOPY Left 11/09/2017   Procedure: COLONOSCOPY;  Surgeon: Virgel Manifold, MD;  Location: [facility] ENDOSCOPY;  Service: Endoscopy;  Laterality: Left;   COLONOSCOPY WITH  PROPOFOL N/A 04/14/2020   Procedure: COLONOSCOPY WITH PROPOFOL;  Surgeon: Virgel Manifold, MD;  Location: [facility] ENDOSCOPY;  Service: Endoscopy;  Laterality: N/A;   CYSTO WITH HYDRODISTENSION N/A 08/15/2017   Procedure: CYSTOSCOPY/HYDRODISTENSION;  Surgeon: Nickie Retort, MD;  Location: [facility] ORS;  Service: Urology;  Laterality: N/A;   ESOPHAGOGASTRODUODENOSCOPY Left 11/09/2017   Procedure: ESOPHAGOGASTRODUODENOSCOPY (EGD);  Surgeon: Virgel Manifold, MD;  Location: [facility] ENDOSCOPY;  Service: Endoscopy;  Laterality: Left;   ESOPHAGOGASTRODUODENOSCOPY (EGD) WITH PROPOFOL N/A 02/22/2020   Procedure: ESOPHAGOGASTRODUODENOSCOPY (EGD) WITH PROPOFOL;  Surgeon: Lucilla Lame, MD;  Location: [facility] ENDOSCOPY;  Service: Endoscopy;  Laterality: N/A;   FOOT SURGERY Right     SOCIAL HISTORY: Social History   Socioeconomic History   Marital status: Single    Spouse name: Not on file   Number of children: 1   Years of education: Not on file   Highest education level: High school graduate  Occupational History   Occupation: retired  Tobacco Use   Smoking status: Never   Smokeless tobacco: Never  Vaping Use   Vaping Use: Never used  Substance and Sexual Activity   Alcohol use: No    Alcohol/week: 0.0 standard drinks of alcohol   Drug use: No   Sexual activity: Never  Other Topics Concern   Not on file  Social History Narrative   Pt's nephew lives with her   Works part time at [employer] Strain: [place]  (02/26/2022)   Overall Financial Resource Strain ([place])    Difficulty of Paying Living Expenses: Not hard at all  Food Insecurity: No [place] (03/06/2022)   Hunger Vital Sign    Worried About Running Out of Food in the Last Year: Never true    [place] in the Last Year: Never true  Transportation Needs: No Transportation Needs (03/06/2022)   PRAPARE - Hydrologist (Medical): No    Lack  of Transportation (Non-Medical): No  Physical Activity: Inactive (02/26/2022)   Exercise Vital Sign    Days of Exercise per Week: 0 days    Minutes of Exercise per Session: 0 min  Stress: No Stress Concern Present (02/26/2022)   [place]    Feeling of Stress : Not at all  Social Connections: Moderately Isolated (02/26/2022)   Social Connection and Isolation Panel [NHANES]    Frequency of Communication with Friends and Family: More than three times a week    Frequency of Social Gatherings with Friends and Family: Once a week    Attends Religious Services: More than 4 times per year    Active Member of [employer] or Organizations: No    Attends Archivist Meetings: Never    Marital Status: Never married  Intimate Partner Violence: Not At Risk (02/26/2022)   Humiliation, Afraid, Rape, and Kick questionnaire    Fear of Current or Ex-Partner: No    Emotionally Abused: No    Physically Abused: No    Sexually Abused: No    FAMILY HISTORY: Family History  Problem Relation Age of Onset   Hypertension  Brother    Heart disease Brother    Heart failure Brother    Hypertension Mother    Stroke Mother    Stroke Father    Prostate cancer Father    Aneurysm Sister    Breast cancer Sister 6   Liver disease Maternal Aunt    Heart failure Paternal Aunt    Heart disease Paternal Uncle    Heart attack Brother    Heart disease Brother    Breast cancer Maternal Aunt    Heart disease Paternal Aunt    Kidney cancer Neg Hx    Bladder Cancer Neg Hx     ALLERGIES:  is allergic to aspirin and lisinopril.  MEDICATIONS:  Current Outpatient Medications  Medication Sig Dispense Refill   acetaminophen (TYLENOL) 650 MG CR tablet Take 650-1,300 mg by mouth every 8 (eight) hours as needed for pain.     bisoprolol-hydrochlorothiazide (ZIAC) 10-6.25 MG tablet Take 1 tablet by mouth daily. 90 tablet 1   Calcium Carbonate (CALCIUM 600 PO)  Take 1,200 mg by mouth daily.     Certolizumab Pegol 2 X 200 MG/ML PSKT Inject 400 mg into the skin every 28 (twenty-eight) days.     Cholecalciferol (VITAMIN D) 50 MCG (2000 UT) CAPS Take by mouth.     clotrimazole (CLOTRIMAZOLE AF) 1 % cream Apply 1 application. topically 2 (two) times daily. 30 g 0   ferrous sulfate (FERROUSUL) 325 (65 FE) MG tablet Take 1 tablet daily. 90 tablet 2   folic acid (FOLVITE) 1 MG tablet Take 1 tablet by mouth daily.     ketoconazole (NIZORAL) 2 % cream For fungal infection - apply to feet and toenails QHS. 60 g 11   loratadine (CLARITIN) 10 MG tablet Take 1 tablet (10 mg total) by mouth daily. 30 tablet 11   losartan (COZAAR) 50 MG tablet Take 1 tablet (50 mg total) by mouth daily. 90 tablet 1   lovastatin (MEVACOR) 40 MG tablet Take 1 tablet (40 mg total) by mouth at bedtime. 90 tablet 1   methotrexate 2.5 MG tablet Take 10 mg by mouth once a week. Pt takes every sunday     mometasone (ELOCON) 0.1 % cream Apply to affected skin twice a day prn up to 5d/wk until clear. Then PRN thereafter. 45 g 2   polyethylene glycol (MIRALAX) 17 g packet Take 17 g by mouth daily. 30 each 2   No current facility-administered medications for this visit.     PHYSICAL EXAMINATION: ECOG PERFORMANCE STATUS: 1 - Symptomatic but completely ambulatory Vitals:   10/10/22 1446  BP: (!) 173/91  Pulse: 64  Resp: 18  Temp: (!) 96.7 F (35.9 C)   Filed Weights   10/10/22 1446  Weight: 197 lb 9.6 oz (89.6 kg)    Physical Exam Constitutional:      General: She is not in acute distress. HENT:     Head: Normocephalic and atraumatic.  Eyes:     General: No scleral icterus. Cardiovascular:     Rate and Rhythm: Normal rate and regular rhythm.     Heart sounds: Normal heart sounds.  Pulmonary:     Effort: Pulmonary effort is normal. No respiratory distress.     Breath sounds: No wheezing.  Abdominal:     General: Bowel sounds are normal. There is no distension.      Palpations: Abdomen is soft.  Musculoskeletal:        General: No deformity. Normal range of motion.  Cervical back: Normal range of motion and neck supple.  Skin:    General: Skin is warm and dry.     Findings: No erythema or rash.  Neurological:     Mental Status: She is alert and oriented to person, place, and time. Mental status is at baseline.     Cranial Nerves: No cranial nerve deficit.     Coordination: Coordination normal.  Psychiatric:        Mood and Affect: Mood normal.          Latest Ref Rng & Units 07/12/2022   12:09 PM  CMP  Glucose 65 - 99 mg/dL 84   BUN 7 - 25 mg/dL 21   Creatinine 0.60 - 1.00 mg/dL 0.76   Sodium 135 - 146 mmol/L 140   Potassium 3.5 - 5.3 mmol/L 3.7   Chloride 98 - 110 mmol/L 103   CO2 20 - 32 mmol/L 27   Calcium 8.6 - 10.4 mg/dL 9.3   Total Protein 6.1 - 8.1 g/dL 6.9   Total Bilirubin 0.2 - 1.2 mg/dL 1.1   AST 10 - 35 U/L 34   ALT 6 - 29 U/L 33       Latest Ref Rng & Units 10/08/2022   11:07 AM  CBC  WBC 4.0 - 10.5 K/uL 4.9   Hemoglobin 12.0 - 15.0 g/dL 12.1   Hematocrit 36.0 - 46.0 % 37.0   Platelets 150 - 400 K/uL 206      LABORATORY DATA:  I have reviewed the data as listed Lab Results  Component Value Date   WBC 4.9 10/08/2022   HGB 12.1 10/08/2022   HCT 37.0 10/08/2022   MCV 96.6 10/08/2022   PLT 206 10/08/2022   Recent Labs    07/12/22 1209  NA 140  K 3.7  CL 103  CO2 27  GLUCOSE 84  BUN 21  CREATININE 0.76  CALCIUM 9.3  PROT 6.9  AST 34  ALT 33*  BILITOT 1.1    Iron/TIBC/Ferritin/ %Sat    Component Value Date/Time   IRON 88 10/08/2022 1107   IRON 36 12/15/2017 1339   TIBC 382 10/08/2022 1107   TIBC 408 12/15/2017 1339   FERRITIN 43 10/08/2022 1107   FERRITIN 39 12/15/2017 1339   IRONPCTSAT 23 10/08/2022 1107   IRONPCTSAT 11 (L) 06/29/2021 0958     RADIOGRAPHIC STUDIES: I have personally reviewed the radiological images as listed and agreed with the findings in the report. No results  found.     ASSESSMENT & PLAN:  1. Iron deficiency anemia due to chronic blood loss    #Iron deficiency anemia, Labs are reviewed and discussed with patient. Normal hemoglobin and iron panel.  No need for additional IV venofer.   Follow up PRN All questions were answered. The patient knows to call the clinic with any problems questions or concerns.  Cc Delsa Grana, PA-C   10/10/2022

## 2022-10-24 ENCOUNTER — Encounter: Payer: Self-pay | Admitting: Dermatology

## 2022-10-28 DIAGNOSIS — M0609 Rheumatoid arthritis without rheumatoid factor, multiple sites: Secondary | ICD-10-CM | POA: Diagnosis not present

## 2022-11-06 NOTE — Progress Notes (Signed)
[facility] Quality Team Note  Name: Holly Green Date of Birth: [birth year] MRN: [id number] Date: 11/06/2022  [facility] Quality Team has reviewed this patient's chart, please see recommendations below:  [facility] Quality Other; ([place], BLOOD PRESSURE. PATIENT IS ON REPORT FOR UNCONTROLLED BLOOD PRESSURE. [facility] QUALITY COORDINATOR ATTEMPTED OUTREACH UNSUCCESSFUL TO SCHEDULE BP CHECK. BLOOD PRESSURE RESULT MUST BE LESS THAN 140/90 FOR GAP CLOSURE. PLEASE ADDRESS AT NEXT OFFICE VISIT WITH PCP)

## 2022-12-02 DIAGNOSIS — M0609 Rheumatoid arthritis without rheumatoid factor, multiple sites: Secondary | ICD-10-CM | POA: Diagnosis not present

## 2022-12-19 ENCOUNTER — Other Ambulatory Visit: Payer: Self-pay | Admitting: Family Medicine

## 2022-12-19 ENCOUNTER — Encounter: Payer: Self-pay | Admitting: Family Medicine

## 2022-12-19 DIAGNOSIS — Z1231 Encounter for screening mammogram for malignant neoplasm of breast: Secondary | ICD-10-CM

## 2023-01-03 DIAGNOSIS — Z7960 Long term (current) use of unspecified immunomodulators and immunosuppressants: Secondary | ICD-10-CM | POA: Diagnosis not present

## 2023-01-03 DIAGNOSIS — M25541 Pain in joints of right hand: Secondary | ICD-10-CM | POA: Diagnosis not present

## 2023-01-03 DIAGNOSIS — M0609 Rheumatoid arthritis without rheumatoid factor, multiple sites: Secondary | ICD-10-CM | POA: Diagnosis not present

## 2023-01-03 DIAGNOSIS — M170 Bilateral primary osteoarthritis of knee: Secondary | ICD-10-CM | POA: Diagnosis not present

## 2023-01-08 ENCOUNTER — Ambulatory Visit
Admission: RE | Admit: 2023-01-08 | Discharge: 2023-01-08 | Disposition: A | Discharge status: Home / Self Care | Payer: Medicare Other | Source: Ambulatory Visit | Attending: Family Medicine | Admitting: Family Medicine

## 2023-01-08 DIAGNOSIS — Z1231 Encounter for screening mammogram for malignant neoplasm of breast: Secondary | ICD-10-CM | POA: Insufficient documentation

## 2023-01-17 ENCOUNTER — Encounter: Payer: Self-pay | Admitting: Family Medicine

## 2023-01-17 ENCOUNTER — Ambulatory Visit (INDEPENDENT_AMBULATORY_CARE_PROVIDER_SITE_OTHER): Payer: Medicare Other | Admitting: Family Medicine

## 2023-01-17 VITALS — BP 164/82 | HR 74 | Temp 97.5°F | Resp 16 | Ht 66.0 in | Wt 202.6 lb

## 2023-01-17 DIAGNOSIS — M0609 Rheumatoid arthritis without rheumatoid factor, multiple sites: Secondary | ICD-10-CM

## 2023-01-17 DIAGNOSIS — I700 Atherosclerosis of aorta: Secondary | ICD-10-CM | POA: Diagnosis not present

## 2023-01-17 DIAGNOSIS — I10 Essential (primary) hypertension: Secondary | ICD-10-CM

## 2023-01-17 DIAGNOSIS — Z5181 Encounter for therapeutic drug level monitoring: Secondary | ICD-10-CM

## 2023-01-17 DIAGNOSIS — K219 Gastro-esophageal reflux disease without esophagitis: Secondary | ICD-10-CM

## 2023-01-17 DIAGNOSIS — E782 Mixed hyperlipidemia: Secondary | ICD-10-CM

## 2023-01-17 DIAGNOSIS — Z780 Asymptomatic menopausal state: Secondary | ICD-10-CM | POA: Diagnosis not present

## 2023-01-17 DIAGNOSIS — J449 Chronic obstructive pulmonary disease, unspecified: Secondary | ICD-10-CM

## 2023-01-17 DIAGNOSIS — E669 Obesity, unspecified: Secondary | ICD-10-CM | POA: Diagnosis not present

## 2023-01-17 DIAGNOSIS — R7303 Prediabetes: Secondary | ICD-10-CM

## 2023-01-17 MED ORDER — LOVASTATIN 40 MG PO TABS: 40.0000 mg | ORAL_TABLET | Freq: Every day | ORAL | 1 refills | Status: DC

## 2023-01-17 MED ORDER — BISOPROLOL-HYDROCHLOROTHIAZIDE 10-6.25 MG PO TABS: 1.0000 | ORAL_TABLET | Freq: Every day | ORAL | 1 refills | Status: DC

## 2023-01-17 MED ORDER — LOSARTAN POTASSIUM 100 MG PO TABS: 100.0000 mg | ORAL_TABLET | Freq: Every day | ORAL | 1 refills | Status: DC

## 2023-01-17 NOTE — Progress Notes (Unsigned)
Name: Holly Green   MRN: [id number]    DOB: 08-01-1946   Date:01/17/2023       Progress Note  Chief Complaint  Patient presents with   Follow-up   Hyperlipidemia   Hypertension   Gastroesophageal Reflux     Subjective:   Holly Green is a 77 y.o. female, presents to clinic for for routine follow-up on chronic conditions, blood pressure is elevated today   Rheumatology appt 2 weeks ago bp was at goal 130/74, however today blood pressure is elevated despite rechecking Blood pressure is managed with bisoprolol/hydrochlorothiazide and losartan, good med compliance without side effects or concerns previously BP Readings from Last 6 Encounters:  01/17/23 (!) 164/82  10/10/22 (!) 173/91  08/26/22 (!) 170/92  07/12/22 126/86  04/09/22 (!) 185/98  02/26/22 (!) 142/80   HLD needs refill -with lovastatin 40 mg daily, good compliance Lab Results  Component Value Date   CHOL 154 07/12/2022   HDL 76 07/12/2022   LDLCALC 65 07/12/2022   TRIG 58 07/12/2022   CHOLHDL 2.0 07/12/2022     Prediabetes - last lab was 5.0 and a few years ago she was prediabetic range Lab Results  Component Value Date   HGBA1C 5.8 (H) 07/12/2022   She sees Dr. Posey Pronto regularly about 2 times per year labs were recently done at [facility] clinic and reviewed today in clinic with her she is managed on methotrexate for rheumatoid arthritis of multiple joints  She sees Dr. Tasia Catchings for iron deficiency anemia Sees dermatology, Dr. Nehemiah Massed for atopic dermatitis GI, Dr. Terri Piedra for diverticulosis   Current Outpatient Medications:    acetaminophen (TYLENOL) 650 MG CR tablet, Take 650-1,300 mg by mouth every 8 (eight) hours as needed for pain., Disp: , Rfl:    bisoprolol-hydrochlorothiazide (ZIAC) 10-6.25 MG tablet, Take 1 tablet by mouth daily., Disp: 90 tablet, Rfl: 1   Calcium Carbonate (CALCIUM 600 PO), Take 1,200 mg by mouth daily., Disp: , Rfl:    Certolizumab Pegol 2 X 200 MG/ML PSKT, Inject 400 mg into the  skin every 28 (twenty-eight) days., Disp: , Rfl:    Cholecalciferol (VITAMIN D) 50 MCG (2000 UT) CAPS, Take by mouth., Disp: , Rfl:    clotrimazole (CLOTRIMAZOLE AF) 1 % cream, Apply 1 application. topically 2 (two) times daily., Disp: 30 g, Rfl: 0   folic acid (FOLVITE) 1 MG tablet, Take 1 tablet by mouth daily., Disp: , Rfl:    ketoconazole (NIZORAL) 2 % cream, For fungal infection - apply to feet and toenails QHS., Disp: 60 g, Rfl: 11   loratadine (CLARITIN) 10 MG tablet, Take 1 tablet (10 mg total) by mouth daily., Disp: 30 tablet, Rfl: 11   losartan (COZAAR) 50 MG tablet, Take 1 tablet (50 mg total) by mouth daily., Disp: 90 tablet, Rfl: 1   lovastatin (MEVACOR) 40 MG tablet, Take 1 tablet (40 mg total) by mouth at bedtime., Disp: 90 tablet, Rfl: 1   methotrexate 2.5 MG tablet, Take 10 mg by mouth once a week. Pt takes every sunday, Disp: , Rfl:    mometasone (ELOCON) 0.1 % cream, Apply to affected skin twice a day prn up to 5d/wk until clear. Then PRN thereafter., Disp: 45 g, Rfl: 2   polyethylene glycol (MIRALAX) 17 g packet, Take 17 g by mouth daily., Disp: 30 each, Rfl: 2   ferrous sulfate (FERROUSUL) 325 (65 FE) MG tablet, Take 1 tablet daily. (Patient not taking: Reported on 01/17/2023), Disp: 90 tablet, Rfl: 2  Patient Active Problem List   Diagnosis Date Noted   IDA (iron deficiency anemia) 09/28/2021   Rheumatoid arthritis of multiple sites with negative rheumatoid factor ([place]) 06/09/2020   Obesity (BMI 30.0-34.9) 08/10/2018   Emphysema of lung ([place]) 08/10/2018   Allergic rhinitis 08/10/2018   Primary osteoarthritis of right knee 07/17/2018   Spondylolisthesis at L4-L5 level 03/07/2017   Abdominal aortic atherosclerosis ([place]) 03/05/2017   Benign liver cyst 03/05/2017   GERD (gastroesophageal reflux disease) 08/07/2016   Osteoporosis 05/07/2016   Prediabetes 10/15/2015   Hyperlipidemia 10/15/2015   Essential hypertension 10/15/2015   Diverticulosis of colon, acquired  10/15/2015    Past Surgical History:  Procedure Laterality Date   ABDOMINAL HYSTERECTOMY     BACK SURGERY  2010   BREAST CYST ASPIRATION Right 1990's   COLONOSCOPY Left 11/09/2017   Procedure: COLONOSCOPY;  Surgeon: Virgel Manifold, MD;  Location: [facility] END[facility]COPY;  Service: Endoscopy;  Laterality: Left;   COLONOSCOPY WITH PROPOFOL N/A 04/14/2020   Procedure: COLONOSCOPY WITH PROPOFOL;  Surgeon: Virgel Manifold, MD;  Location: [facility] ENDOSCOPY;  Service: Endoscopy;  Laterality: N/A;   CYSTO WITH HYDRODISTENSION N/A 08/15/2017   Procedure: CYSTOSCOPY/HYDRODISTENSION;  Surgeon: Nickie Retort, MD;  Location: [facility] ORS;  Service: Urology;  Laterality: N/A;   ESOPHAGOGASTRODUODENOSCOPY Left 11/09/2017   Procedure: ESOPHAGOGASTRODUODENOSCOPY (EGD);  Surgeon: Virgel Manifold, MD;  Location: [facility] ENDOSCOPY;  Service: Endoscopy;  Laterality: Left;   ESOPHAGOGASTRODUODENOSCOPY (EGD) WITH PROPOFOL N/A 02/22/2020   Procedure: ESOPHAGOGASTRODUODENOSCOPY (EGD) WITH PROPOFOL;  Surgeon: Lucilla Lame, MD;  Location: [facility] ENDOSCOPY;  Service: Endoscopy;  Laterality: N/A;   FOOT SURGERY Right     Family History  Problem Relation Age of Onset   Hypertension Brother    Heart disease Brother    Heart failure Brother    Hypertension Mother    Stroke Mother    Stroke Father    Prostate cancer Father    Aneurysm Sister    Breast cancer Sister 61   Liver disease Maternal Aunt    Heart failure Paternal Aunt    Heart disease Paternal Uncle    Heart attack Brother    Heart disease Brother    Breast cancer Maternal Aunt    Heart disease Paternal Aunt    Kidney cancer Neg Hx    Bladder Cancer Neg Hx     Social History   Tobacco Use   Smoking status: Never   Smokeless tobacco: Never  Vaping Use   Vaping Use: Never used  Substance Use Topics   Alcohol use: No    Alcohol/week: 0.0 standard drinks of alcohol   Drug use: No     Allergies  Allergen Reactions   Aspirin Other (See  Comments)    Diverticulosis   Lisinopril Cough    Health Maintenance  Topic Date Due   DTaP/Tdap/Td (2 - Td or Tdap) 06/15/2022   DEXA SCAN  08/21/2022   Medicare Annual Wellness (AWV)  02/27/2023   COVID-19 Vaccine (4 - 2023-24 season) 02/02/2023 (Originally 07/26/2022)   INFLUENZA VACCINE  02/23/2023 (Originally 06/25/2022)   Zoster Vaccines- Shingrix (1 of 2) 04/17/2023 (Originally 08/17/1966)   Pneumonia Vaccine 38+ Years old (2 of 2 - PPSV23 or PCV20) 01/18/2024 (Originally 10/01/2018)   MAMMOGRAM  01/09/2024   COLONOSCOPY (Pts 45-26yr Insurance coverage will need to be confirmed)  04/14/2025   Hepatitis C Screening  Completed   HPV VACCINES  Aged Out    Chart Review Today: I personally reviewed active problem list, medication list,  allergies, family history, social history, health maintenance, notes from last encounter, lab results, imaging with the patient/caregiver today.   Review of Systems  Constitutional: Negative.   HENT: Negative.    Eyes: Negative.   Respiratory: Negative.    Cardiovascular: Negative.   Gastrointestinal: Negative.   Endocrine: Negative.   Genitourinary: Negative.   Musculoskeletal: Negative.   Skin: Negative.   Allergic/Immunologic: Negative.   Neurological: Negative.   Hematological: Negative.   Psychiatric/Behavioral: Negative.    All other systems reviewed and are negative.    Objective:   Vitals:   01/17/23 1048 01/17/23 1112  BP: (!) 170/86 (!) 164/82  Pulse: 74   Resp: 16   Temp: (!) 97.5 F (36.4 C)   TempSrc: Oral   SpO2: 98%   Weight: 202 lb 9.6 oz (91.9 kg)   Height: '5\' 6"'$  (1.676 m)     Body mass index is 32.7 kg/m.  Physical Exam Vitals and nursing note reviewed.  Constitutional:      General: She is not in acute distress.    Appearance: Normal appearance. She is well-developed. She is not ill-appearing, toxic-appearing or diaphoretic.     Comments: Elderly female, well-appearing, appears stated age  HENT:      Head: Normocephalic and atraumatic.     Nose: Nose normal.  Eyes:     General:        Right eye: No discharge.        Left eye: No discharge.     Conjunctiva/sclera: Conjunctivae normal.  Neck:     Trachea: No tracheal deviation.  Cardiovascular:     Rate and Rhythm: Normal rate and regular rhythm.     Pulses: Normal pulses.     Heart sounds: Normal heart sounds.  Pulmonary:     Effort: Pulmonary effort is normal. No respiratory distress.     Breath sounds: Normal breath sounds. No stridor.  Musculoskeletal:        General: Normal range of motion.  Skin:    General: Skin is warm and dry.     Capillary Refill: Capillary refill takes less than 2 seconds.     Findings: No rash.  Neurological:     Mental Status: She is alert. Mental status is at baseline.     Motor: No abnormal muscle tone.     Coordination: Coordination normal.  Psychiatric:        Mood and Affect: Mood normal.        Behavior: Behavior normal.         Assessment & Plan:   Problem List Items Addressed This Visit       Cardiovascular and Mediastinum   Essential hypertension - Primary (Chronic)    Patient's blood pressure is elevated today despite recheck, she is well-appearing and does not endorse any pain today or anxiety.  She has been taking her medications as directed and previously hypertension was well-controlled on visit propyl hydrochlorothiazide and losartan (all at max doses) Did review her chart thoroughly and her most recent rheumatology office visit 01/03/2023 BP was 130/74 - at goal Blood Pressure 130/74 01/03/2023 10:01 AM EST  She would not like to make any med changes right now, home BP monitoring, return to office for nurse BP recheck in 2 weeks, asx Labs recently check at [facility] and reviewed today        Relevant Medications   losartan (COZAAR) 100 MG tablet   bisoprolol-hydrochlorothiazide (ZIAC) 10-6.25 MG tablet   lovastatin (MEVACOR) 40 MG tablet  Abdominal aortic  atherosclerosis (HCC) (Chronic)    Per prior CT scan, on statin      Relevant Medications   losartan (COZAAR) 100 MG tablet   bisoprolol-hydrochlorothiazide (ZIAC) 10-6.25 MG tablet   lovastatin (MEVACOR) 40 MG tablet     Digestive   GERD (gastroesophageal reflux disease) (Chronic)    Stable, well-controlled, using over-the-counter medications very rarely, largely controlled with avoiding food triggers        Musculoskeletal and Integument   Rheumatoid arthritis of multiple sites with negative rheumatoid factor ([place])    Per Dr. Posey Pronto at [place] on methotrexate, good follow-up with specialist every 6 months notes and labs have been reviewed        Other   Hyperlipidemia (Chronic)    Has been stable and well-controlled on lovastatin 40 mg, labs done at last office visit in August.  LDL at goal, good med compliance without any side effects or concerns Will be due for recheck of labs at next follow-up visit in August in 6 months Lab Results  Component Value Date   CHOL 154 07/12/2022   HDL 76 07/12/2022   LDLCALC 65 07/12/2022   TRIG 58 07/12/2022   CHOLHDL 2.0 07/12/2022        Relevant Medications   losartan (COZAAR) 100 MG tablet   bisoprolol-hydrochlorothiazide (ZIAC) 10-6.25 MG tablet   lovastatin (MEVACOR) 40 MG tablet   Obesity (BMI 30.0-34.9) (Chronic)   Prediabetes    Last A1c was elevated in August for routine follow-up prior to that her last A1c was normal at 5.0 and many years ago she had prediabetes She prefers to not recheck it today but will at her next routine follow-up visit Encouraged healthy diet, avoiding sweets or reducing portion of carbohydrates, increasing lean proteins, fruits and vegetables       Other Visit Diagnoses     Encounter for medication monitoring       Thorough review of all labs done at [facility] about 2 weeks ago, due for lipids in 6 months, recommend recheck A1c at that time   Postmenopausal estrogen deficiency       Relevant  Orders   DG Bone Density       Patient will monitor her blood pressure at home she was strongly encouraged to follow-up in the office if her blood pressure is on average over 140/80 -she does have recent office visits with blood pressure documented at goal, reading is high today, asx, good med compliance, continued med compliance diet/lifestyle efforts recommended  Return in about 6 months (around 07/18/2023) for Routine follow-up.   Delsa Grana, PA-C 01/17/23 11:21 AM

## 2023-01-23 ENCOUNTER — Encounter: Payer: Self-pay | Admitting: Family Medicine

## 2023-01-23 NOTE — Assessment & Plan Note (Signed)
Patient's blood pressure is elevated today despite recheck, she is well-appearing and does not endorse any pain today or anxiety.  She has been taking her medications as directed and previously hypertension was well-controlled on visit propyl hydrochlorothiazide and losartan (all at max doses) Did review her chart thoroughly and her most recent rheumatology office visit 01/03/2023 BP was 130/74 - at goal Blood Pressure 130/74 01/03/2023 10:01 AM EST   She would not like to make any med changes right now, home BP monitoring, return to office for nurse BP recheck in 2 weeks, asx Labs recently check at [facility] and reviewed today

## 2023-01-23 NOTE — Assessment & Plan Note (Signed)
Per Dr. Posey Pronto at [place] on methotrexate, good follow-up with specialist every 6 months notes and labs have been reviewed

## 2023-01-23 NOTE — Assessment & Plan Note (Signed)
Last A1c was elevated in August for routine follow-up prior to that her last A1c was normal at 5.0 and many years ago she had prediabetes She prefers to not recheck it today but will at her next routine follow-up visit Encouraged healthy diet, avoiding sweets or reducing portion of carbohydrates, increasing lean proteins, fruits and vegetables

## 2023-01-23 NOTE — Assessment & Plan Note (Signed)
Per prior CT scan, on statin

## 2023-01-23 NOTE — Assessment & Plan Note (Signed)
Has been stable and well-controlled on lovastatin 40 mg, labs done at last office visit in August.  LDL at goal, good med compliance without any side effects or concerns Will be due for recheck of labs at next follow-up visit in August in 6 months Lab Results  Component Value Date   CHOL 154 07/12/2022   HDL 76 07/12/2022   LDLCALC 65 07/12/2022   TRIG 58 07/12/2022   CHOLHDL 2.0 07/12/2022

## 2023-01-23 NOTE — Assessment & Plan Note (Signed)
Stable, well-controlled, using over-the-counter medications very rarely, largely controlled with avoiding food triggers

## 2023-01-24 DIAGNOSIS — H2512 Age-related nuclear cataract, left eye: Secondary | ICD-10-CM | POA: Diagnosis not present

## 2023-02-04 DIAGNOSIS — M0609 Rheumatoid arthritis without rheumatoid factor, multiple sites: Secondary | ICD-10-CM | POA: Diagnosis not present

## 2023-02-04 NOTE — Progress Notes (Signed)
[facility] Quality Team Note  Name: Holly Green Date of Birth: October 14, 1946 MRN: [id number] Date: 02/04/2023  [facility] Quality Team has reviewed this patient's chart, please see recommendations below:  [facility] Quality Other; ([place] BLOOD PRESSURE LESS THAN 140/90 FOR GAP CLOSURE. PATIENT HAS UPCOMING APPT WITH PCP 02/28/2023. PLEASE ADDRESS DURING OFFICE VISIT)

## 2023-02-17 DIAGNOSIS — H269 Unspecified cataract: Secondary | ICD-10-CM | POA: Diagnosis not present

## 2023-02-17 DIAGNOSIS — H2512 Age-related nuclear cataract, left eye: Secondary | ICD-10-CM | POA: Diagnosis not present

## 2023-03-04 DIAGNOSIS — M0609 Rheumatoid arthritis without rheumatoid factor, multiple sites: Secondary | ICD-10-CM | POA: Diagnosis not present

## 2023-03-07 DIAGNOSIS — H2511 Age-related nuclear cataract, right eye: Secondary | ICD-10-CM | POA: Diagnosis not present

## 2023-03-07 DIAGNOSIS — H269 Unspecified cataract: Secondary | ICD-10-CM | POA: Diagnosis not present

## 2023-03-14 ENCOUNTER — Ambulatory Visit (INDEPENDENT_AMBULATORY_CARE_PROVIDER_SITE_OTHER): Payer: Medicare Other

## 2023-03-14 VITALS — BP 126/80 | Ht 66.0 in | Wt 200.0 lb

## 2023-03-14 DIAGNOSIS — Z0000 Encounter for general adult medical examination without abnormal findings: Secondary | ICD-10-CM

## 2023-03-14 NOTE — Progress Notes (Signed)
Subjective:   Holly Green is a 77 y.o. female who presents for Medicare Annual (Subsequent) preventive examination.  Review of Systems    Cardiac Risk Factors include: advanced age (>21men, >3 women);dyslipidemia;hypertension;obesity (BMI >30kg/m2);sedentary lifestyle    Objective:    Today's Vitals   03/14/23 1112  BP: 126/80  Weight: 200 lb (90.7 kg)  Height: 5\' 6"  (1.676 m)   Body mass index is 32.28 kg/m.     03/14/2023   11:31 AM 04/09/2022    1:10 PM 02/26/2022    3:11 PM 07/05/2021    9:29 AM 05/25/2021    2:30 PM 02/08/2021   10:25 AM 01/05/2021   10:23 AM  Advanced Directives  Does Patient Have a Medical Advance Directive? Yes No No Yes No Yes No  Type of Clinical research associate of [employer] Power of [place];Living will   Copy of Healthcare Power of Attorney in Chart?      No - copy requested   Would patient like information on creating a medical advance directive?  No - Patient declined Yes (MAU/Ambulatory/Procedural Areas - Information given)  No - Patient declined  No - Patient declined    Current Medications (verified) Outpatient Encounter Medications as of 03/14/2023  Medication Sig   acetaminophen (TYLENOL) 650 MG CR tablet Take 650-1,300 mg by mouth every 8 (eight) hours as needed for pain.   bisoprolol-hydrochlorothiazide (ZIAC) 10-6.25 MG tablet Take 1 tablet by mouth daily.   Calcium Carbonate (CALCIUM 600 PO) Take 1,200 mg by mouth daily.   Certolizumab Pegol 2 X 200 MG/ML PSKT Inject 400 mg into the skin every 28 (twenty-eight) days.   Cholecalciferol (VITAMIN D) 50 MCG (2000 UT) CAPS Take by mouth.   clotrimazole (CLOTRIMAZOLE AF) 1 % cream Apply 1 application. topically 2 (two) times daily.   ferrous sulfate (FERROUSUL) 325 (65 FE) MG tablet Take 1 tablet daily. (Patient not taking: Reported on 01/17/2023)   ketoconazole (NIZORAL) 2 % cream For fungal infection - apply to feet and toenails QHS.    loratadine (CLARITIN) 10 MG tablet Take 1 tablet (10 mg total) by mouth daily.   losartan (COZAAR) 100 MG tablet Take 1 tablet (100 mg total) by mouth daily.   lovastatin (MEVACOR) 40 MG tablet Take 1 tablet (40 mg total) by mouth at bedtime.   methotrexate 2.5 MG tablet Take 10 mg by mouth once a week. Pt takes every sunday   mometasone (ELOCON) 0.1 % cream Apply to affected skin twice a day prn up to 5d/wk until clear. Then PRN thereafter.   polyethylene glycol (MIRALAX) 17 g packet Take 17 g by mouth daily.   No facility-administered encounter medications on file as of 03/14/2023.    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, of knee, 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   COAD (chronic obstructive airways disease) 08/03/2018   Diverticulitis 2015   Gastrointestinal hemorrhage 12/29/2019   GERD (gastroesophageal reflux disease)    GI bleeding 05/25/2021   History of GI bleed 11/06/2017   History of kidney stones    History of stomach ulcers  Hyperlipidemia    Hypertension    Left hip pain 10/15/2019   Osteoporosis    Pre-diabetes    Prediabetes 10/15/2015   Simple cyst of kidney 03/05/2017   CT scan April 2018   Spondylolisthesis at L4-L5 level 03/07/2017   Past Surgical History:  Procedure Laterality Date   ABDOMINAL HYSTERECTOMY     BACK SURGERY  2010   BREAST CYST ASPIRATION Right 1990's   COLONOSCOPY Left 11/09/2017   Procedure: COLONOSCOPY;  Surgeon: Pasty Spillers, MD;  Location: [facility] ENDOSCOPY;  Service: Endoscopy;  Laterality: Left;   COLONOSCOPY WITH PROPOFOL N/A 04/14/2020   Procedure: COLONOSCOPY WITH  PROPOFOL;  Surgeon: Pasty Spillers, MD;  Location: [facility] ENDOSCOPY;  Service: Endoscopy;  Laterality: N/A;   CYSTO WITH HYDRODISTENSION N/A 08/15/2017   Procedure: CYSTOSCOPY/HYDRODISTENSION;  Surgeon: Hildred Laser, MD;  Location: [facility] ORS;  Service: Urology;  Laterality: N/A;   ESOPHAGOGASTRODUODENOSCOPY Left 11/09/2017   Procedure: ESOPHAGOGASTRODUODENOSCOPY (EGD);  Surgeon: Pasty Spillers, MD;  [facility];  Service: Endoscopy;  Laterality: Left;   ESOPHAGOGASTRODUODENOSCOPY (EGD) WITH PROPOFOL N/A 02/22/2020   Procedure: ESOPHAGOGASTRODUODENOSCOPY (EGD) WITH PROPOFOL;  Surgeon: Midge Minium, MD;  Location: [facility] ENDOSCOPY;  Service: Endoscopy;  Laterality: N/A;   FOOT SURGERY Right    Family History  Problem Relation Age of Onset   Hypertension Brother    Heart disease Brother    Heart failure Brother    Hypertension Mother    Stroke Mother    Stroke Father    Prostate cancer Father    Aneurysm Sister    Breast cancer Sister 25   Liver disease Maternal Aunt    Heart failure Paternal Aunt    Heart disease Paternal Uncle    Heart attack Brother    Heart disease Brother    Breast cancer Maternal Aunt    Heart disease Paternal Aunt    Kidney cancer Neg Hx    Bladder Cancer Neg Hx    Social History   Socioeconomic History   Marital status: Single    Spouse name: Not on file   Number of children: 1   Years of education: Not on file   Highest education level: High school graduate  Occupational History   Occupation: retired  Tobacco Use   Smoking status: Never   Smokeless tobacco: Never  Vaping Use   Vaping Use: Never used  Substance and Sexual Activity   Alcohol use: No    Alcohol/week: 0.0 standard drinks of alcohol   Drug use: No   Sexual activity: Never  Other Topics Concern   Not on file  Social History Narrative   Pt's nephew lives with her   Works part time at [employer]   Social Determinants of Health   Financial Resource  Strain: Low Risk  (03/14/2023)   Overall Financial Resource Strain (CARDIA)    Difficulty of Paying Living Expenses: Not hard at all  Food Insecurity: No Food Insecurity (03/14/2023)   Hunger Vital Sign    Worried About Running Out of Food in the Last Year: Never true    Ran Out of Food in the Last Year: Never true  Transportation Needs: No Transportation Needs (03/14/2023)   PRAPARE - Administrator, Civil Service (Medical): No    Lack of Transportation (Non-Medical): No  Physical Activity: Inactive (03/14/2023)   Exercise Vital Sign    Days of Exercise per Week: 0 days    Minutes of Exercise per Session: 0 min  Stress:  No Stress Concern Present (03/14/2023)   [employer] of Occupational Health - Occupational Stress Questionnaire    Feeling of Stress : Not at all  Social Connections: Moderately Isolated (03/14/2023)   Social Connection and Isolation Panel [NHANES]    Frequency of Communication with Friends and Family: More than three times a week    Frequency of Social Gatherings with Friends and Family: Once a week    Attends Religious Services: More than 4 times per year    Active Member of [employer] or Organizations: No    Attends Engineer, structural: Never    Marital Status: Never married    Tobacco Counseling Counseling given: Not Answered   Clinical Intake:  Pre-visit preparation completed: Yes  Pain : No/denies pain     BMI - recorded: 32.28 Nutritional Status: BMI > 30  Obese Nutritional Risks: None Diabetes: No  How often do you need to have someone help you when you read instructions, pamphlets, or other written materials from your doctor or pharmacy?: 1 - Never  Diabetic?no  Interpreter Needed?: No  Comments: nephew lives with pt Information entered by :: B.Cenia Zaragosa,LPN   Activities of Daily Living    03/14/2023   11:32 AM 01/17/2023   10:47 AM  In your present state of health, do you have any difficulty performing the following  activities:  Hearing? 0 0  Vision? 1 1  Difficulty concentrating or making decisions? 1 1  Walking or climbing stairs? 1 1  Dressing or bathing? 0 0  Doing errands, shopping? 0 0  Preparing Food and eating ? N   Using the Toilet? N   In the past six months, have you accidently leaked urine? N   Do you have problems with loss of bowel control? N   Managing your Medications? N   Managing your Finances? N   Housekeeping or managing your Housekeeping? N     Patient Care Team: Danelle Berry, PA-C as PCP - General (Family Medicine) Rickard Patience, MD as Consulting Physician (Hematology and Oncology) Patterson Hammersmith, MD as Consulting Physician (Rheumatology)  Indicate any recent Medical Services you may have received from other than [facility] providers in the past year (date may be approximate).     Assessment:   This is a routine wellness examination for [place].  Hearing/Vision screen Hearing Screening - Comments:: Adequate hearing Vision Screening - Comments:: Recent cataract surgery (March and April) Wears glasses-[place]  Dietary issues and exercise activities discussed: Current Exercise Habits: The patient does not participate in regular exercise at present, Exercise limited by: orthopedic condition(s)   Goals Addressed             This Visit's Progress    Increase physical activity   Not on track    Recommend increasing physical activity to at least 3 days per week as tolerated.        Depression Screen    03/14/2023   11:25 AM 01/17/2023   10:47 AM 07/12/2022   11:34 AM 03/06/2022    1:09 PM 02/26/2022    3:10 PM 01/04/2022   11:28 AM 06/29/2021    9:42 AM  PHQ 2/9 Scores  PHQ - 2 Score 0 0 0 0 0 0 0  PHQ- 9 Score 0 0 0  3 0 0    Fall Risk    03/14/2023   11:20 AM 01/17/2023   10:47 AM 07/12/2022   11:34 AM 03/06/2022    1:07 PM 02/26/2022    3:13 PM  Fall Risk   Falls in the past year? 1 1 0 0 0  Number falls in past yr: 0 1 0 0 0  Injury with Fall? 0 0 0  0  Risk for  fall due to : No Fall Risks Impaired balance/gait No Fall Risks Medication side effect No Fall Risks  Follow up Education provided;Falls prevention discussed Falls prevention discussed;Education provided;Falls evaluation completed Falls prevention discussed Falls prevention discussed Falls prevention discussed    FALL RISK PREVENTION PERTAINING TO THE HOME:  Any stairs in or around the home? Yes ramp If so, are there any without handrails? Yes  Home free of loose throw rugs in walkways, pet beds, electrical cords, etc? Yes  Adequate lighting in your home to reduce risk of falls? Yes   ASSISTIVE DEVICES UTILIZED TO PREVENT FALLS:  Life alert? No  Use of a cane, walker or w/c? No  Grab bars in the bathroom? No  Shower chair or bench in shower? No  Elevated toilet seat or a handicapped toilet? Yes   TIMED UP AND GO:  Was the test performed? Yes .  Length of time to ambulate 10 feet: 15 sec.   Gait slow and steady without use of assistive device  Cognitive Function:        03/14/2023   11:34 AM 02/04/2020   10:25 AM  6CIT Screen  What Year? 0 points 0 points  What month? 0 points 0 points  What time? 0 points 0 points  Count back from 20 0 points 0 points  Months in reverse 0 points 0 points  Repeat phrase 0 points 0 points  Total Score 0 points 0 points    Immunizations Immunization History  Administered Date(s) Administered   Fluad Quad(high Dose 65+) 10/15/2019, 08/17/2020, 01/04/2022   Hepatitis A, Ped/Adol-2 Dose 08/17/1947   Hepatitis B, PED/ADOLESCENT 08[birth year]17/1947   Influenza, High Dose Seasonal PF 08/06/2018   PFIZER(Purple Top)SARS-COV-2 Vaccination 01/19/2020, 02/09/2020, 09/11/2020   PNEUMOCOCCAL CONJUGATE-20 08/17/2012   Pneumococcal Conjugate-13 08/06/2018   Pneumococcal Polysaccharide-23 08/17/2012   RSV,unspecified 07/12/2022   Td 07/12/2022   Tdap 06/15/2012, 07/12/2022   Varicella 08/17/1960   Zoster, Live 08/17/1997    TDAP status: Up to  date  Flu Vaccine status: Up to date  Pneumococcal vaccine status: Up to date  Covid-19 vaccine status: Completed vaccines  Qualifies for Shingles Vaccine? Yes   Zostavax completed No   Shingrix Completed?: No.    Education has been provided regarding the importance of this vaccine. Patient has been advised to call insurance company to determine out of pocket expense if they have not yet received this vaccine. Advised may also receive vaccine at local pharmacy or Health Dept. Verbalized acceptance and understanding.  Screening Tests Health Maintenance  Topic Date Due   COVID-19 Vaccine (4 - 2023-24 season) 07/26/2022   DEXA SCAN  08/21/2022   Zoster Vaccines- Shingrix (1 of 2) 04/17/2023 (Originally 08/17/1965)   INFLUENZA VACCINE  06/26/2023   MAMMOGRAM  01/09/2024   Medicare Annual Wellness (AWV)  03/13/2024   COLONOSCOPY (Pts 45-34yrs Insurance coverage will need to be confirmed)  04/14/2025   DTaP/Tdap/Td (4 - Td or Tdap) 07/12/2032   Pneumonia Vaccine 62+ Years old  Completed   Hepatitis C Screening  Completed   HPV VACCINES  Aged Out    Health Maintenance  Health Maintenance Due  Topic Date Due   COVID-19 Vaccine (4 - 2023-24 season) 07/26/2022   DEXA SCAN  08/21/2022    Colorectal cancer  screening: No longer required.   Mammogram status: No longer required due to age.  Bone Density status: Completed yes. Results reflect: Bone density results: OSTEOPOROSIS. Repeat every 5 years.  Lung Cancer Screening: (Low Dose CT Chest recommended if Age 35-80 years, 30 pack-year currently smoking OR have quit w/in 15years.) does not qualify.   Lung Cancer Screening Referral: no  Additional Screening:  Hepatitis C Screening: does not qualify; Completed yes  Vision Screening: Recommended annual ophthalmology exams for early detection of glaucoma and other disorders of the eye. Is the patient up to date with their annual eye exam?  Yes  Who is the provider or what is the name  of the office in which the patient attends annual eye exams? Dr Dava Najjar If pt is not established with a provider, would they like to be referred to a provider to establish care? No .   Dental Screening: Recommended annual dental exams for proper oral hygiene  Community Resource Referral / Chronic Care Management: CRR required this visit?  No   CCM required this visit?  No      Plan:     I have personally reviewed and noted the following in the patient's chart:   Medical and social history Use of alcohol, tobacco or illicit drugs  Current medications and supplements including opioid prescriptions. Patient is not currently taking opioid prescriptions. Functional ability and status Nutritional status Physical activity Advanced directives List of other physicians Hospitalizations, surgeries, and ER visits in previous 12 months Vitals Screenings to include cognitive, depression, and falls Referrals and appointments  In addition, I have reviewed and discussed with patient certain preventive protocols, quality metrics, and best practice recommendations. A written personalized care plan for preventive services as well as general preventive health recommendations were provided to patient.     Sue Lush, LPN   1/61/0960   Nurse Notes: pt states she is doing well. She does inquire about a Bone Density she says PCP relayed she needed. Pt indicates she never was able to schedule.

## 2023-04-01 DIAGNOSIS — M0609 Rheumatoid arthritis without rheumatoid factor, multiple sites: Secondary | ICD-10-CM | POA: Diagnosis not present

## 2023-05-06 DIAGNOSIS — M170 Bilateral primary osteoarthritis of knee: Secondary | ICD-10-CM | POA: Diagnosis not present

## 2023-05-06 DIAGNOSIS — Z79899 Other long term (current) drug therapy: Secondary | ICD-10-CM | POA: Diagnosis not present

## 2023-05-06 DIAGNOSIS — M0609 Rheumatoid arthritis without rheumatoid factor, multiple sites: Secondary | ICD-10-CM | POA: Diagnosis not present

## 2023-05-20 ENCOUNTER — Ambulatory Visit
Admission: RE | Admit: 2023-05-20 | Discharge: 2023-05-20 | Disposition: A | Discharge status: Home / Self Care | Payer: Medicare Other | Source: Ambulatory Visit | Attending: Family Medicine | Admitting: Family Medicine

## 2023-05-20 DIAGNOSIS — M8589 Other specified disorders of bone density and structure, multiple sites: Secondary | ICD-10-CM | POA: Diagnosis not present

## 2023-05-20 DIAGNOSIS — Z780 Asymptomatic menopausal state: Secondary | ICD-10-CM | POA: Diagnosis not present

## 2023-06-03 DIAGNOSIS — M0609 Rheumatoid arthritis without rheumatoid factor, multiple sites: Secondary | ICD-10-CM | POA: Diagnosis not present

## 2023-07-02 DIAGNOSIS — M0609 Rheumatoid arthritis without rheumatoid factor, multiple sites: Secondary | ICD-10-CM | POA: Diagnosis not present

## 2023-07-12 ENCOUNTER — Other Ambulatory Visit: Payer: Self-pay | Admitting: Family Medicine

## 2023-07-12 DIAGNOSIS — I10 Essential (primary) hypertension: Secondary | ICD-10-CM

## 2023-07-12 DIAGNOSIS — E782 Mixed hyperlipidemia: Secondary | ICD-10-CM

## 2023-07-12 DIAGNOSIS — I700 Atherosclerosis of aorta: Secondary | ICD-10-CM

## 2023-07-17 ENCOUNTER — Telehealth: Payer: Medicare Other | Admitting: Family Medicine

## 2023-07-18 ENCOUNTER — Ambulatory Visit: Payer: Medicare Other | Admitting: Family Medicine

## 2023-07-22 ENCOUNTER — Encounter: Payer: Self-pay | Admitting: Family Medicine

## 2023-07-22 ENCOUNTER — Ambulatory Visit (INDEPENDENT_AMBULATORY_CARE_PROVIDER_SITE_OTHER): Payer: Medicare Other | Admitting: Family Medicine

## 2023-07-22 VITALS — BP 146/82 | HR 69 | Temp 97.9°F | Resp 16 | Ht 66.0 in | Wt 199.1 lb

## 2023-07-22 DIAGNOSIS — K219 Gastro-esophageal reflux disease without esophagitis: Secondary | ICD-10-CM | POA: Diagnosis not present

## 2023-07-22 DIAGNOSIS — Z5181 Encounter for therapeutic drug level monitoring: Secondary | ICD-10-CM

## 2023-07-22 DIAGNOSIS — E669 Obesity, unspecified: Secondary | ICD-10-CM | POA: Diagnosis not present

## 2023-07-22 DIAGNOSIS — I10 Essential (primary) hypertension: Secondary | ICD-10-CM | POA: Diagnosis not present

## 2023-07-22 DIAGNOSIS — R7303 Prediabetes: Secondary | ICD-10-CM

## 2023-07-22 DIAGNOSIS — E782 Mixed hyperlipidemia: Secondary | ICD-10-CM | POA: Diagnosis not present

## 2023-07-22 MED ORDER — BISOPROLOL FUMARATE 10 MG PO TABS
10.0000 mg | ORAL_TABLET | Freq: Every day | ORAL | 0 refills | Status: DC
Start: 2023-07-22 — End: 2023-10-22

## 2023-07-22 MED ORDER — LOSARTAN POTASSIUM-HCTZ 100-12.5 MG PO TABS: 1.0000 | ORAL_TABLET | Freq: Every day | ORAL | 0 refills | Status: DC

## 2023-07-22 NOTE — Progress Notes (Signed)
Name: [name]   MRN: [id number]    DOB: 01/02/1946   Date:07/22/2023       Progress Note  Chief Complaint  Patient presents with   Follow-up   Hypertension   Hyperlipidemia   Gastroesophageal Reflux     Subjective:   ILIANA Green is a 77 y.o. female, presents to clinic for f/up on chronic conditions  HTN pt states readings are only high here, reviewed other VS On bisoprolol-hydrochlorothiazide 10-6.25 and losartan 100 BP Readings from Last 3 Encounters:  07/22/23 (!) 148/82  03/14/23 126/80  01/17/23 (!) 164/82  Per care everywhere BP readings monthly [facility] are usually 130's, pt reports BP at home also 130-140's  On lovastatin  Lab Results  Component Value Date   CHOL 154 07/12/2022   HDL 76 07/12/2022   LDLCALC 65 07/12/2022   TRIG 58 07/12/2022   CHOLHDL 2.0 07/12/2022   GERD- stomach has been hurting lately - more indigestion lately  Seeing GI on miralax as needed         Current Outpatient Medications:    acetaminophen (TYLENOL) 650 MG CR tablet, Take 650-1,300 mg by mouth every 8 (eight) hours as needed for pain., Disp: , Rfl:    bisoprolol-hydrochlorothiazide (ZIAC) 10-6.25 MG tablet, Take 1 tablet by mouth once daily, Disp: 90 tablet, Rfl: 0   Calcium Carbonate (CALCIUM 600 PO), Take 1,200 mg by mouth daily., Disp: , Rfl:    Certolizumab Pegol 2 X 200 MG/ML PSKT, Inject 400 mg into the skin every 28 (twenty-eight) days., Disp: , Rfl:    Cholecalciferol (VITAMIN D) 50 MCG (2000 UT) CAPS, Take by mouth., Disp: , Rfl:    clotrimazole (CLOTRIMAZOLE AF) 1 % cream, Apply 1 application. topically 2 (two) times daily., Disp: 30 g, Rfl: 0   ketoconazole (NIZORAL) 2 % cream, For fungal infection - apply to feet and toenails QHS., Disp: 60 g, Rfl: 11   loratadine (CLARITIN) 10 MG tablet, Take 1 tablet (10 mg total) by mouth daily., Disp: 30 tablet, Rfl: 11   losartan (COZAAR) 100 MG tablet, Take 1 tablet by mouth once daily, Disp: 90 tablet, Rfl: 0    lovastatin (MEVACOR) 40 MG tablet, TAKE 1 TABLET BY MOUTH AT BEDTIME, Disp: 90 tablet, Rfl: 0   methotrexate 2.5 MG tablet, Take 10 mg by mouth once a week. Pt takes every sunday, Disp: , Rfl:    mometasone (ELOCON) 0.1 % cream, Apply to affected skin twice a day prn up to 5d/wk until clear. Then PRN thereafter., Disp: 45 g, Rfl: 2   polyethylene glycol (MIRALAX) 17 g packet, Take 17 g by mouth daily., Disp: 30 each, Rfl: 2   ferrous sulfate (FERROUSUL) 325 (65 FE) MG tablet, Take 1 tablet daily. (Patient not taking: Reported on 01/17/2023), Disp: 90 tablet, Rfl: 2  Patient Active Problem List   Diagnosis Date Noted   IDA (iron deficiency anemia) 09/28/2021   Rheumatoid arthritis of multiple sites with negative rheumatoid factor (HCC) 06/09/2020   Obesity (BMI 30.0-34.9) 08/10/2018   Emphysema of lung (HCC) 08/10/2018   Allergic rhinitis 08/10/2018   Primary osteoarthritis of right knee 07/17/2018   Spondylolisthesis at L4-L5 level 03/07/2017   Abdominal aortic atherosclerosis (HCC) 03/05/2017   Benign liver cyst 03/05/2017   GERD (gastroesophageal reflux disease) 08/07/2016   Osteoporosis 05/07/2016   Prediabetes 10/15/2015   Hyperlipidemia 10/15/2015   Essential hypertension 10/15/2015   Diverticulosis of colon, acquired 10/15/2015    Past Surgical History:  Procedure Laterality Date   ABDOMINAL HYSTERECTOMY     BACK SURGERY  2010   BREAST CYST ASPIRATION Right 1990's   COLONOSCOPY Left 11/09/2017   Procedure: COLONOSCOPY;  Surgeon: Pasty Spillers, MD;  Location: [facility] ENDOSCOPY;  Service: Endoscopy;  Laterality: Left;   COLONOSCOPY WITH PROPOFOL N/A 04/14/2020   Procedure: COLONOSCOPY WITH PROPOFOL;  Surgeon: Pasty Spillers, MD;  Location: [facility] ENDOSCOPY;  Service: Endoscopy;  Laterality: N/A;   CYSTO WITH HYDRODISTENSION N/A 08/15/2017   Procedure: CYSTOSCOPY/HYDRODISTENSION;  Surgeon: Hildred Laser, MD;  Location: [facility] ORS;  Service: Urology;  Laterality: N/A;    ESOPHAGOGASTRODUODENOSCOPY Left 11/09/2017   Procedure: ESOPHAGOGASTRODUODENOSCOPY (EGD);  Surgeon: Pasty Spillers, MD;  Location: [facility] ENDOSCOPY;  Service: Endoscopy;  Laterality: Left;   ESOPHAGOGASTRODUODENOSCOPY (EGD) WITH PROPOFOL N/A 02/22/2020   Procedure: ESOPHAGOGASTRODUODENOSCOPY (EGD) WITH PROPOFOL;  Surgeon: Midge Minium, MD;  Location: [facility] ENDOSCOPY;  Service: Endoscopy;  Laterality: N/A;   FOOT SURGERY Right     Family History  Problem Relation Age of Onset   Hypertension Brother    Heart disease Brother    Heart failure Brother    Hypertension Mother    Stroke Mother    Stroke Father    Prostate cancer Father    Aneurysm Sister    Breast cancer Sister 73   Liver disease Maternal Aunt    Heart failure Paternal Aunt    Heart disease Paternal Uncle    Heart attack Brother    Heart disease Brother    Breast cancer Maternal Aunt    Heart disease Paternal Aunt    Kidney cancer Neg Hx    Bladder Cancer Neg Hx     Social History   Tobacco Use   Smoking status: Never   Smokeless tobacco: Never  Vaping Use   Vaping status: Never Used  Substance Use Topics   Alcohol use: No    Alcohol/week: 0.0 standard drinks of alcohol   Drug use: No     Allergies  Allergen Reactions   Aspirin Other (See Comments)    Diverticulosis   Lisinopril Cough    Health Maintenance  Topic Date Due   INFLUENZA VACCINE  06/26/2023   COVID-19 Vaccine (4 - 2023-24 season) 08/07/2023 (Originally 07/26/2022)   Zoster Vaccines- Shingrix (1 of 2) 10/22/2023 (Originally 08/17/1965)   MAMMOGRAM  01/09/2024   Medicare Annual Wellness (AWV)  03/13/2024   Colonoscopy  04/14/2025   DEXA SCAN  05/19/2025   DTaP/Tdap/Td (4 - Td or Tdap) 07/12/2032   Pneumonia Vaccine 29+ Years old  Completed   Hepatitis C Screening  Completed   HPV VACCINES  Aged Out    Chart Review Today: I personally reviewed active problem list, medication list, allergies, family history, social history, health  maintenance, notes from last encounter, lab results, imaging with the patient/caregiver today.   Review of Systems  Constitutional: Negative.   HENT: Negative.    Eyes: Negative.   Respiratory: Negative.    Cardiovascular: Negative.   Gastrointestinal: Negative.   Endocrine: Negative.   Genitourinary: Negative.   Musculoskeletal: Negative.   Skin: Negative.   Allergic/Immunologic: Negative.   Neurological: Negative.   Hematological: Negative.   Psychiatric/Behavioral: Negative.    All other systems reviewed and are negative.    Objective:   Vitals:   07/22/23 1505 07/22/23 1524  BP: (!) 150/84 (!) 148/82  Pulse: 69   Resp: 16   Temp: 97.9 F (36.6 C)   TempSrc: Oral   SpO2: 96%  Weight: 199 lb 1.6 oz (90.3 kg)   Height: 5\' 6"  (1.676 m)     Body mass index is 32.14 kg/m.  Physical Exam Vitals and nursing note reviewed.  Constitutional:      General: She is not in acute distress.    Appearance: Normal appearance. She is well-developed. She is not ill-appearing, toxic-appearing or diaphoretic.     Comments: Elderly female, well-appearing, appears stated age  HENT:     Head: Normocephalic and atraumatic.     Nose: Nose normal.  Eyes:     General:        Right eye: No discharge.        Left eye: No discharge.     Conjunctiva/sclera: Conjunctivae normal.  Neck:     Trachea: No tracheal deviation.  Cardiovascular:     Rate and Rhythm: Normal rate and regular rhythm.     Pulses: Normal pulses.     Heart sounds: Normal heart sounds.  Pulmonary:     Effort: Pulmonary effort is normal. No respiratory distress.     Breath sounds: Normal breath sounds. No stridor.  Musculoskeletal:        General: Normal range of motion.  Skin:    General: Skin is warm and dry.     Capillary Refill: Capillary refill takes less than 2 seconds.     Findings: No rash.  Neurological:     Mental Status: She is alert. Mental status is at baseline.     Motor: No abnormal muscle  tone.     Coordination: Coordination normal.  Psychiatric:        Mood and Affect: Mood normal.        Behavior: Behavior normal.         Assessment & Plan:     ICD-10-CM   1. Essential hypertension  I10 COMPLETE METABOLIC PANEL WITH GFR    losartan-hydrochlorothiazide (HYZAAR) 100-12.5 MG tablet    bisoprolol (ZEBETA) 10 MG tablet   majority of BP VS are above goal, reviewed care everywhere records as well similarly above goal, trial of small dose change increase HCTZ to 12.5    2. Mixed hyperlipidemia  E78.2 COMPLETE METABOLIC PANEL WITH GFR    Lipid panel   good statin compliance, no SE or concerns, due for labs    3. Gastroesophageal reflux disease, unspecified whether esophagitis present  K21.9    sx currently poorly controlled    4. Prediabetes  R73.03 COMPLETE METABOLIC PANEL WITH GFR    Hemoglobin A1c   recheck labs every 6 to 12 months    5. Obesity (BMI 30.0-34.9)  E66.9 COMPLETE METABOLIC PANEL WITH GFR    6. Encounter for medication monitoring  Z51.81 COMPLETE METABOLIC PANEL WITH GFR    Hemoglobin A1c    Lipid panel       Needs ~4 week f/up BP recheck  Danelle Berry, PA-C 07/22/23 3:29 PM

## 2023-07-23 LAB — COMPLETE METABOLIC PANEL WITH GFR
AG Ratio: 1.5 (calc) (ref 1.0–2.5)
ALT: 16 U/L (ref 6–29)
AST: 21 U/L (ref 10–35)
Albumin: 3.8 g/dL (ref 3.6–5.1)
Alkaline phosphatase (APISO): 87 U/L (ref 37–153)
BUN: 23 mg/dL (ref 7–25)
CO2: 27 mmol/L (ref 20–32)
Calcium: 9 mg/dL (ref 8.6–10.4)
Chloride: 106 mmol/L (ref 98–110)
Creat: 0.9 mg/dL (ref 0.60–1.00)
Globulin: 2.5 g/dL (ref 1.9–3.7)
Glucose, Bld: 105 mg/dL — ABNORMAL HIGH (ref 65–99)
Potassium: 4.1 mmol/L (ref 3.5–5.3)
Sodium: 142 mmol/L (ref 135–146)
Total Bilirubin: 1.2 mg/dL (ref 0.2–1.2)
Total Protein: 6.3 g/dL (ref 6.1–8.1)
eGFR: 67 mL/min/{1.73_m2} (ref >=60)

## 2023-07-23 LAB — HEMOGLOBIN A1C
Hgb A1c MFr Bld: 6.1 %{Hb} — ABNORMAL HIGH (ref <5.7)
Mean Plasma Glucose: 128 mg/dL
eAG (mmol/L): 7.1 mmol/L

## 2023-07-23 LAB — LIPID PANEL
Cholesterol: 139 mg/dL (ref <200)
HDL: 65 mg/dL (ref >=50)
LDL Cholesterol (Calc): 59 mg/dL
Non-HDL Cholesterol (Calc): 74 mg/dL (ref <130)
Total CHOL/HDL Ratio: 2.1 (calc) (ref <5.0)
Triglycerides: 72 mg/dL (ref <150)

## 2023-07-30 DIAGNOSIS — M0609 Rheumatoid arthritis without rheumatoid factor, multiple sites: Secondary | ICD-10-CM | POA: Diagnosis not present

## 2023-07-31 ENCOUNTER — Encounter: Payer: Self-pay | Admitting: Family Medicine

## 2023-07-31 ENCOUNTER — Ambulatory Visit (INDEPENDENT_AMBULATORY_CARE_PROVIDER_SITE_OTHER): Payer: Medicare Other | Admitting: Family Medicine

## 2023-07-31 VITALS — BP 126/82 | HR 83 | Temp 97.1°F | Resp 16 | Ht 66.0 in | Wt 193.4 lb

## 2023-07-31 DIAGNOSIS — J069 Acute upper respiratory infection, unspecified: Secondary | ICD-10-CM

## 2023-07-31 DIAGNOSIS — U071 COVID-19: Secondary | ICD-10-CM | POA: Diagnosis not present

## 2023-07-31 DIAGNOSIS — Z20822 Contact with and (suspected) exposure to covid-19: Secondary | ICD-10-CM | POA: Diagnosis not present

## 2023-07-31 NOTE — Progress Notes (Signed)
Patient ID: Holly Green, female    DOB: 02/19/1946, 77 y.o.   MRN: [id number]  PCP: Danelle Berry, PA-C  Chief Complaint  Patient presents with   Covid Positive    At home test this am, no sx but exposed and wants an in office test done    Subjective:   Holly Green is a 77 y.o. female, presents to clinic with CC of the following:  HPI  Mild to no symptoms but she knew she was exposed to covid and tested today and was positive  Chronic cough improved with OTC meds Scratchy throat and post nasal drip also not changed much from her baseline  - maybe scratchy throat a little worse than normal on Monday  She is here wanting a covid test  Only other sx is some upper GI irritation and some "twinge" across her back - no pain with breathing, SOB, wheeze, N/V/GI sx  Patient Active Problem List   Diagnosis Date Noted   IDA (iron deficiency anemia) 09/28/2021   Rheumatoid arthritis of multiple sites with negative rheumatoid factor (HCC) 06/09/2020   Obesity (BMI 30.0-34.9) 08/10/2018   Emphysema of lung (HCC) 08/10/2018   Allergic rhinitis 08/10/2018   Primary osteoarthritis of right knee 07/17/2018   Spondylolisthesis at L4-L5 level 03/07/2017   Abdominal aortic atherosclerosis (HCC) 03/05/2017   Benign liver cyst 03/05/2017   GERD (gastroesophageal reflux disease) 08/07/2016   Osteoporosis 05/07/2016   Prediabetes 10/15/2015   Hyperlipidemia 10/15/2015   Essential hypertension 10/15/2015   Diverticulosis of colon, acquired 10/15/2015      Current Outpatient Medications:    acetaminophen (TYLENOL) 650 MG CR tablet, Take 650-1,300 mg by mouth every 8 (eight) hours as needed for pain., Disp: , Rfl:    bisoprolol (ZEBETA) 10 MG tablet, Take 1 tablet (10 mg total) by mouth daily., Disp: 90 tablet, Rfl: 0   Calcium Carbonate (CALCIUM 600 PO), Take 1,200 mg by mouth daily., Disp: , Rfl:    Certolizumab Pegol 2 X 200 MG/ML PSKT, Inject 400 mg into the skin every 28  (twenty-eight) days., Disp: , Rfl:    Cholecalciferol (VITAMIN D) 50 MCG (2000 UT) CAPS, Take by mouth., Disp: , Rfl:    clotrimazole (CLOTRIMAZOLE AF) 1 % cream, Apply 1 application. topically 2 (two) times daily., Disp: 30 g, Rfl: 0   ketoconazole (NIZORAL) 2 % cream, For fungal infection - apply to feet and toenails QHS., Disp: 60 g, Rfl: 11   loratadine (CLARITIN) 10 MG tablet, Take 1 tablet (10 mg total) by mouth daily., Disp: 30 tablet, Rfl: 11   losartan-hydrochlorothiazide (HYZAAR) 100-12.5 MG tablet, Take 1 tablet by mouth daily., Disp: 90 tablet, Rfl: 0   lovastatin (MEVACOR) 40 MG tablet, TAKE 1 TABLET BY MOUTH AT BEDTIME, Disp: 90 tablet, Rfl: 0   methotrexate 2.5 MG tablet, Take 10 mg by mouth once a week. Pt takes every sunday, Disp: , Rfl:    mometasone (ELOCON) 0.1 % cream, Apply to affected skin twice a day prn up to 5d/wk until clear. Then PRN thereafter., Disp: 45 g, Rfl: 2   polyethylene glycol (MIRALAX) 17 g packet, Take 17 g by mouth daily., Disp: 30 each, Rfl: 2   Allergies  Allergen Reactions   Aspirin Other (See Comments)    Diverticulosis   Lisinopril Cough     Social History   Tobacco Use   Smoking status: Never   Smokeless tobacco: Never  Vaping Use   Vaping status: Never Used  Substance Use Topics   Alcohol use: No    Alcohol/week: 0.0 standard drinks of alcohol   Drug use: No      Chart Review Today: I personally reviewed active problem list, medication list, allergies, family history, social history, health maintenance, notes from last encounter, lab results, imaging with the patient/caregiver today.   Review of Systems  Constitutional: Negative.   HENT: Negative.    Eyes: Negative.   Respiratory: Negative.    Cardiovascular: Negative.   Gastrointestinal: Negative.   Endocrine: Negative.   Genitourinary: Negative.   Musculoskeletal: Negative.   Skin: Negative.   Allergic/Immunologic: Negative.   Neurological: Negative.   Hematological:  Negative.   Psychiatric/Behavioral: Negative.    All other systems reviewed and are negative.      Objective:   Vitals:   07/31/23 1149  BP: 126/82  Pulse: 83  Resp: 16  Temp: (!) 97.1 F (36.2 C)  TempSrc: Temporal  SpO2: 98%  Weight: 193 lb 6.4 oz (87.7 kg)  Height: 5\' 6"  (1.676 m)    Body mass index is 31.22 kg/m.  Physical Exam Vitals and nursing note reviewed.  Constitutional:      General: She is not in acute distress.    Appearance: Normal appearance. She is well-developed. She is obese. She is not ill-appearing, toxic-appearing or diaphoretic.  HENT:     Head: Normocephalic and atraumatic.     Nose: Nose normal.  Eyes:     General:        Right eye: No discharge.        Left eye: No discharge.     Conjunctiva/sclera: Conjunctivae normal.  Neck:     Trachea: No tracheal deviation.  Cardiovascular:     Rate and Rhythm: Normal rate and regular rhythm.     Pulses: Normal pulses.     Heart sounds: Normal heart sounds.  Pulmonary:     Effort: Pulmonary effort is normal. No respiratory distress.     Breath sounds: Normal breath sounds. No stridor. No wheezing, rhonchi or rales.  Skin:    General: Skin is warm and dry.     Findings: No rash.  Neurological:     Mental Status: She is alert.     Motor: No abnormal muscle tone.     Coordination: Coordination normal.  Psychiatric:        Behavior: Behavior normal.      Results for orders placed or performed in visit on 07/22/23  COMPLETE METABOLIC PANEL WITH GFR  Result Value Ref Range   Glucose, Bld 105 (H) 65 - 99 mg/dL   BUN 23 7 - 25 mg/dL   Creat 1.61 0.96 - 0.45 mg/dL   eGFR 67 > OR = 60 WU/JWJ/1.91Y7   BUN/Creatinine Ratio SEE NOTE: 6 - 22 (calc)   Sodium 142 135 - 146 mmol/L   Potassium 4.1 3.5 - 5.3 mmol/L   Chloride 106 98 - 110 mmol/L   CO2 27 20 - 32 mmol/L   Calcium 9.0 8.6 - 10.4 mg/dL   Total Protein 6.3 6.1 - 8.1 g/dL   Albumin 3.8 3.6 - 5.1 g/dL   Globulin 2.5 1.9 - 3.7 g/dL (calc)    AG Ratio 1.5 1.0 - 2.5 (calc)   Total Bilirubin 1.2 0.2 - 1.2 mg/dL   Alkaline phosphatase (APISO) 87 37 - 153 U/L   AST 21 10 - 35 U/L   ALT 16 6 - 29 U/L  Hemoglobin A1c  Result Value Ref Range  Hgb A1c MFr Bld 6.1 (H) <5.7 % of total Hgb   Mean Plasma Glucose 128 mg/dL   eAG (mmol/L) 7.1 mmol/L  Lipid panel  Result Value Ref Range   Cholesterol 139 <200 mg/dL   HDL 65 > OR = 50 mg/dL   Triglycerides 72 <161 mg/dL   LDL Cholesterol (Calc) 59 mg/dL (calc)   Total CHOL/HDL Ratio 2.1 <5.0 (calc)   Non-HDL Cholesterol (Calc) 74 <096 mg/dL (calc)       Assessment & Plan:   1. Exposure to confirmed case of COVID-19 - Novel Coronavirus, NAA ([facility])  2. Upper respiratory tract infection due to COVID-19 virus Pt with possible exposure over a week ago, possible mild sx on Monday (3 d ago) she got a home COVID test on Monday because she had slight increase scratchy throat and she tested positive however since then has had improved symptoms managed with over-the-counter medications which she explains is chronic cough slightly better than her normal and improved and resolved scratchy throat.  She mentions some vague discomfort across her back without any respiratory symptoms associated.  She not been febrile or had any other constitutional symptoms.  Well-appearing with stable vital signs.  We discussed management of COVID URI viral illness/symptoms, return to work, change in management from prior years. Since she has had no symptoms since Tuesday despite the positive test she does not have to isolate and can return to work I encouraged her to try to mask in public for 5 days since her symptoms started or since the last day she had some mild symptoms. She wants to return to work today and asked for a work note -which was written for her We discussed Paxlovid medication option however with virtually no symptoms today, I do not think Paxlovid would be very helpful or indicated.  I encouraged  her to follow-up if she has new symptoms develop -did encourage her to stay home if she had fever body aches sweats chills severe respiratory symptoms or diarrhea and follow-up in office  I additionally explained to her that additional testing for COVID sent to the lab will likely be positive and her home COVID testing when positive is very accurate.  Prior to me seeing the patient the test had been done and ordered though not indicated and the result will not likely change management, patient was also notified that the lab results will come over the weekend and so she will probably not hear back from [name] until next Monday (again with expected COVID + results)     Danelle Berry, PA-C 07/31/23 12:08 PM

## 2023-07-31 NOTE — Patient Instructions (Addendum)
I recommend wearing a mask until Saturday just to prevent possible spread.  But since you have not had fever or severe respiratory symptoms you do not have to isolate and can return to work. The 5 days of masking after you are fever free for 24 hours is an additional caution to try and limit possible spread to others.  If you start to develop new symptoms like fever, cough, shortness of breath, body aches, sweats, chills, then I recommend you stay home with those symptoms until they are resolved for more than 24 hours.

## 2023-08-01 LAB — NOVEL CORONAVIRUS, NAA: SARS-CoV-2, NAA: DETECTED — AB

## 2023-09-12 DIAGNOSIS — Z7960 Long term (current) use of unspecified immunomodulators and immunosuppressants: Secondary | ICD-10-CM | POA: Diagnosis not present

## 2023-09-12 DIAGNOSIS — M0609 Rheumatoid arthritis without rheumatoid factor, multiple sites: Secondary | ICD-10-CM | POA: Diagnosis not present

## 2023-09-12 DIAGNOSIS — M170 Bilateral primary osteoarthritis of knee: Secondary | ICD-10-CM | POA: Diagnosis not present

## 2023-09-12 DIAGNOSIS — M1120 Other chondrocalcinosis, unspecified site: Secondary | ICD-10-CM | POA: Diagnosis not present

## 2023-10-10 ENCOUNTER — Other Ambulatory Visit: Payer: Self-pay | Admitting: Family Medicine

## 2023-10-10 DIAGNOSIS — I10 Essential (primary) hypertension: Secondary | ICD-10-CM

## 2023-10-10 DIAGNOSIS — I700 Atherosclerosis of aorta: Secondary | ICD-10-CM

## 2023-10-10 DIAGNOSIS — E782 Mixed hyperlipidemia: Secondary | ICD-10-CM

## 2023-10-14 DIAGNOSIS — M0609 Rheumatoid arthritis without rheumatoid factor, multiple sites: Secondary | ICD-10-CM | POA: Diagnosis not present

## 2023-10-20 NOTE — Telephone Encounter (Unsigned)
Copied from CRM [id number]. Topic: General - Inquiry >> Oct 20, 2023  1:31 PM Runell Gess P wrote: Reason for CRM: pt called saying she needs refills on all her current medications.  I can not tell what is current for her and she doesn't seem to know.  [id number]  [phone]

## 2023-10-22 ENCOUNTER — Other Ambulatory Visit: Payer: Self-pay | Admitting: Family Medicine

## 2023-10-22 DIAGNOSIS — E782 Mixed hyperlipidemia: Secondary | ICD-10-CM

## 2023-10-22 DIAGNOSIS — I700 Atherosclerosis of aorta: Secondary | ICD-10-CM

## 2023-10-22 DIAGNOSIS — I10 Essential (primary) hypertension: Secondary | ICD-10-CM

## 2023-10-22 MED ORDER — LOSARTAN POTASSIUM-HCTZ 100-12.5 MG PO TABS: 1.0000 | ORAL_TABLET | Freq: Every day | ORAL | 0 refills | Status: DC

## 2023-10-22 MED ORDER — LOVASTATIN 40 MG PO TABS: 40.0000 mg | ORAL_TABLET | Freq: Every day | ORAL | 0 refills | Status: DC

## 2023-10-22 MED ORDER — BISOPROLOL FUMARATE 10 MG PO TABS: 10.0000 mg | ORAL_TABLET | Freq: Every day | ORAL | 0 refills | Status: DC

## 2023-10-22 NOTE — Telephone Encounter (Signed)
Medication Refill -  Most Recent Primary Care Visit:  Provider: Danelle Berry  Department: [facility]  Visit Type: OFFICE VISIT  Date: 07/31/2023  Medication: bisoprolol (ZEBETA) 10 MG tablet and  lovastatin (MEVACOR) 40 MG tablet and losartan-hydrochlorothiazide (HYZAAR) 100-12  Has the patient contacted their pharmacy? no   Is this the correct pharmacy for this prescription? Yes If no, delete pharmacy and type the correct one.  This is the patient's preferred pharmacy:  [facility] [address]), [place] - [address] [place]) [place] [address] Phone: [phone] Fax: [phone]   Has the prescription been filled recently? Yes  Is the patient out of the medication? No  Has the patient been seen for an appointment in the last year OR does the patient have an upcoming appointment? Yes  Can we respond through MyChart? Yes  Agent: Please be advised that Rx refills may take up to 3 business days. We ask that you follow-up with your pharmacy.

## 2023-10-22 NOTE — Telephone Encounter (Signed)
Requested Prescriptions  Pending Prescriptions Disp Refills   bisoprolol (ZEBETA) 10 MG tablet 90 tablet 0    Sig: Take 1 tablet (10 mg total) by mouth daily.     Cardiovascular: Beta Blockers 2 Passed - 10/22/2023 10:01 AM      Passed - Cr in normal range and within 360 days    Creat  Date Value Ref Range Status  07/22/2023 0.90 0.60 - 1.00 mg/dL Final         Passed - Last BP in normal range    BP Readings from Last 1 Encounters:  07/31/23 126/82         Passed - Last Heart Rate in normal range    Pulse Readings from Last 1 Encounters:  07/31/23 83         Passed - Valid encounter within last 6 months    Recent Outpatient Visits           2 months ago Exposure to confirmed case of COVID-19   [facility] Danelle Berry, PA-C   3 months ago Essential hypertension   [facility] Danelle Berry, PA-C   9 months ago Essential hypertension   [facility] Danelle Berry, PA-C   1 year ago Essential hypertension   [facility] Margarita Mail, DO   1 year ago Tinea corporis   [facility] Caro Laroche, DO       Future Appointments             In 3 months Danelle Berry, PA-C [facility]             losartan-hydrochlorothiazide (HYZAAR) 100-12.5 MG tablet 90 tablet 0    Sig: Take 1 tablet by mouth daily.     Cardiovascular: ARB + Diuretic Combos Passed - 10/22/2023 10:01 AM      Passed - K in normal range and within 180 days    Potassium  Date Value Ref Range Status  07/22/2023 4.1 3.5 - 5.3 mmol/L Final  01/16/2014 3.5 3.5 - 5.1 mmol/L Final         Passed - Na in normal range and within 180 days    Sodium  Date Value Ref Range Status  07/22/2023 142 135 - 146 mmol/L Final  10/15/2019 141 134 - 144 mmol/L Final  01/16/2014 141 136 - 145 mmol/L Final         Passed - Cr in normal range and  within 180 days    Creat  Date Value Ref Range Status  07/22/2023 0.90 0.60 - 1.00 mg/dL Final         Passed - eGFR is 10 or above and within 180 days    GFR, Est African American  Date Value Ref Range Status  12/29/2020 95 > OR = 60 mL/min/1.77m2 Final   GFR, Est Non African American  Date Value Ref Range Status  12/29/2020 82 > OR = 60 mL/min/1.14m2 Final   GFR, Estimated  Date Value Ref Range Status  10/05/2021 >60 >60 mL/min Final    Comment:    (NOTE) Calculated using the CKD-EPI Creatinine Equation (2021)    eGFR  Date Value Ref Range Status  07/22/2023 67 > OR = 60 mL/min/1.92m2 Final         Passed - Patient is not pregnant      Passed - Last BP in normal range    BP  Readings from Last 1 Encounters:  07/31/23 126/82         Passed - Valid encounter within last 6 months    Recent Outpatient Visits           2 months ago Exposure to confirmed case of COVID-19   [facility] Danelle Berry, PA-C   3 months ago Essential hypertension   [facility] Danelle Berry, PA-C   9 months ago Essential hypertension   [facility] Danelle Berry, PA-C   1 year ago Essential hypertension   [facility] Margarita Mail, DO   1 year ago Tinea corporis   [facility] Caro Laroche, DO       Future Appointments             In 3 months Danelle Berry, PA-C [facility]             lovastatin (MEVACOR) 40 MG tablet 90 tablet 0    Sig: Take 1 tablet (40 mg total) by mouth at bedtime.     Cardiovascular:  Antilipid - Statins 2 Failed - 10/22/2023 10:01 AM      Failed - Lipid Panel in normal range within the last 12 months    Cholesterol, Total  Date Value Ref Range Status  10/15/2019 156 100 - 199 mg/dL Final   Cholesterol  Date Value Ref Range Status  07/22/2023 139 <200 mg/dL Final   LDL  Cholesterol (Calc)  Date Value Ref Range Status  07/22/2023 59 mg/dL (calc) Final    Comment:    Reference range: <100 . Desirable range <100 mg/dL for primary prevention;   <70 mg/dL for patients with CHD or diabetic patients  with > or = 2 CHD risk factors. Marland Kitchen LDL-C is now calculated using the Martin-Hopkins  calculation, which is a validated novel method providing  better accuracy than the Friedewald equation in the  estimation of LDL-C.  Horald Pollen et al. Lenox Ahr. 6644;034(74): 2061-2068  (http://education.[employer].com/faq/FAQ164)    HDL  Date Value Ref Range Status  07/22/2023 65 > OR = 50 mg/dL Final  25/95/6387 79 >56 mg/dL Final   Triglycerides  Date Value Ref Range Status  07/22/2023 72 <150 mg/dL Final         Passed - Cr in normal range and within 360 days    Creat  Date Value Ref Range Status  07/22/2023 0.90 0.60 - 1.00 mg/dL Final         Passed - Patient is not pregnant      Passed - Valid encounter within last 12 months    Recent Outpatient Visits           2 months ago Exposure to confirmed case of COVID-19   [facility] Danelle Berry, PA-C   3 months ago Essential hypertension   [facility] Danelle Berry, PA-C   9 months ago Essential hypertension   [facility] Danelle Berry, PA-C   1 year ago Essential hypertension   [facility] Margarita Mail, DO   1 year ago Tinea corporis   [facility] Caro Laroche, DO       Future Appointments             In 3 months Danelle Berry, PA-C [facility]

## 2023-11-11 DIAGNOSIS — M0609 Rheumatoid arthritis without rheumatoid factor, multiple sites: Secondary | ICD-10-CM | POA: Diagnosis not present

## 2023-12-19 DIAGNOSIS — M0609 Rheumatoid arthritis without rheumatoid factor, multiple sites: Secondary | ICD-10-CM | POA: Diagnosis not present

## 2024-01-16 DIAGNOSIS — M0609 Rheumatoid arthritis without rheumatoid factor, multiple sites: Secondary | ICD-10-CM | POA: Diagnosis not present

## 2024-01-16 DIAGNOSIS — M170 Bilateral primary osteoarthritis of knee: Secondary | ICD-10-CM | POA: Diagnosis not present

## 2024-01-16 DIAGNOSIS — M7702 Medial epicondylitis, left elbow: Secondary | ICD-10-CM | POA: Diagnosis not present

## 2024-01-16 DIAGNOSIS — M1120 Other chondrocalcinosis, unspecified site: Secondary | ICD-10-CM | POA: Diagnosis not present

## 2024-01-16 DIAGNOSIS — Z7960 Long term (current) use of unspecified immunomodulators and immunosuppressants: Secondary | ICD-10-CM | POA: Diagnosis not present

## 2024-01-19 ENCOUNTER — Other Ambulatory Visit: Payer: Self-pay | Admitting: Family Medicine

## 2024-01-19 DIAGNOSIS — I700 Atherosclerosis of aorta: Secondary | ICD-10-CM

## 2024-01-19 DIAGNOSIS — E782 Mixed hyperlipidemia: Secondary | ICD-10-CM

## 2024-01-19 NOTE — Telephone Encounter (Signed)
 Pt needs f/u appt

## 2024-01-23 ENCOUNTER — Ambulatory Visit (INDEPENDENT_AMBULATORY_CARE_PROVIDER_SITE_OTHER): Payer: Medicare Other | Admitting: Family Medicine

## 2024-01-23 ENCOUNTER — Encounter: Payer: Self-pay | Admitting: Family Medicine

## 2024-01-23 ENCOUNTER — Encounter: Payer: Self-pay | Admitting: Oncology

## 2024-01-23 VITALS — BP 130/82 | HR 69 | Resp 16 | Ht 66.0 in | Wt 198.0 lb

## 2024-01-23 DIAGNOSIS — M810 Age-related osteoporosis without current pathological fracture: Secondary | ICD-10-CM | POA: Diagnosis not present

## 2024-01-23 DIAGNOSIS — J438 Other emphysema: Secondary | ICD-10-CM | POA: Diagnosis not present

## 2024-01-23 DIAGNOSIS — D500 Iron deficiency anemia secondary to blood loss (chronic): Secondary | ICD-10-CM | POA: Diagnosis not present

## 2024-01-23 DIAGNOSIS — E782 Mixed hyperlipidemia: Secondary | ICD-10-CM

## 2024-01-23 DIAGNOSIS — K219 Gastro-esophageal reflux disease without esophagitis: Secondary | ICD-10-CM

## 2024-01-23 DIAGNOSIS — M0609 Rheumatoid arthritis without rheumatoid factor, multiple sites: Secondary | ICD-10-CM | POA: Diagnosis not present

## 2024-01-23 DIAGNOSIS — E66811 Obesity, class 1: Secondary | ICD-10-CM

## 2024-01-23 DIAGNOSIS — Z5181 Encounter for therapeutic drug level monitoring: Secondary | ICD-10-CM

## 2024-01-23 DIAGNOSIS — R7303 Prediabetes: Secondary | ICD-10-CM

## 2024-01-23 DIAGNOSIS — I10 Essential (primary) hypertension: Secondary | ICD-10-CM | POA: Diagnosis not present

## 2024-01-23 DIAGNOSIS — Z1231 Encounter for screening mammogram for malignant neoplasm of breast: Secondary | ICD-10-CM

## 2024-01-23 DIAGNOSIS — I700 Atherosclerosis of aorta: Secondary | ICD-10-CM

## 2024-01-23 MED ORDER — LOSARTAN POTASSIUM-HCTZ 100-12.5 MG PO TABS: 1.0000 | ORAL_TABLET | Freq: Every day | ORAL | 1 refills | Status: DC

## 2024-01-23 MED ORDER — BISOPROLOL FUMARATE 10 MG PO TABS: 10.0000 mg | ORAL_TABLET | Freq: Every day | ORAL | 1 refills | Status: DC

## 2024-01-23 NOTE — Assessment & Plan Note (Signed)
 Increasing A1C over the past year Recheck A1C today Not on meds and no specific diet/lifestyle efforts Lab Results  Component Value Date   HGBA1C 6.1 (H) 07/22/2023   HGBA1C 5.8 (H) 07/12/2022   HGBA1C 5.0 06/29/2021   HGBA1C 5.9 (H) 12/29/2020

## 2024-01-23 NOTE — Assessment & Plan Note (Signed)
 Per rheumatology, office visit and lab work done in the last week reviewed through care everywhere

## 2024-01-23 NOTE — Assessment & Plan Note (Signed)
 Stable, well-controlled, using over-the-counter medications very rarely, largely controlled with avoiding food triggers

## 2024-01-23 NOTE — Progress Notes (Signed)
 Name: Holly Green   MRN: [id number]    DOB: [birth year]   Date:01/23/2024       Progress Note  Chief Complaint  Patient presents with   Medical Management of Chronic Issues     Subjective:   Holly Green is a 78 y.o. female, presents to clinic for routine follow up on chronic conditions  Last OV August, here for 6 month f/up  Hyperlipidemia:  hx of atherosclerotic disease managed on lovastatin Last Lipids: Lab Results  Component Value Date   CHOL 139 07/22/2023   HDL 65 07/22/2023   LDLCALC 59 07/22/2023   TRIG 72 07/22/2023   CHOLHDL 2.1 07/22/2023  Reports good med compliance and no SE or concerns - Denies: Chest pain, shortness of breath, myalgias, claudication   Hypertension:  Currently managed on losartan-hydrochlorothiazide and BB Pt reports good med compliance and denies any SE.   Blood pressure today is well controlled. BP Readings from Last 3 Encounters:  01/23/24 130/82  07/31/23 126/82  07/22/23 (!) 146/82  Pt denies CP, SOB, exertional sx, LE edema, palpitation, Ha's, visual disturbances, lightheadedness, hypotension, syncope.   Hx of prediabetes Lab Results  Component Value Date   HGBA1C 6.1 (H) 07/22/2023  A1c has been increasing       Current Outpatient Medications:    acetaminophen (TYLENOL) 650 MG CR tablet, Take 650-1,300 mg by mouth every 8 (eight) hours as needed for pain., Disp: , Rfl:    Calcium Carbonate (CALCIUM 600 PO), Take 1,200 mg by mouth daily., Disp: , Rfl:    Certolizumab Pegol 2 X 200 MG/ML PSKT, Inject 400 mg into the skin every 28 (twenty-eight) days., Disp: , Rfl:    Cholecalciferol (VITAMIN D) 50 MCG (2000 UT) CAPS, Take by mouth., Disp: , Rfl:    clotrimazole (CLOTRIMAZOLE AF) 1 % cream, Apply 1 application. topically 2 (two) times daily., Disp: 30 g, Rfl: 0   ketoconazole (NIZORAL) 2 % cream, For fungal infection - apply to feet and toenails QHS., Disp: 60 g, Rfl: 11   loratadine (CLARITIN) 10 MG tablet, Take 1  tablet (10 mg total) by mouth daily., Disp: 30 tablet, Rfl: 11   lovastatin (MEVACOR) 40 MG tablet, TAKE 1 TABLET BY MOUTH AT BEDTIME, Disp: 30 tablet, Rfl: 0   methotrexate 2.5 MG tablet, Take 10 mg by mouth once a week. Pt takes every sunday, Disp: , Rfl:    mometasone (ELOCON) 0.1 % cream, Apply to affected skin twice a day prn up to 5d/wk until clear. Then PRN thereafter., Disp: 45 g, Rfl: 2   polyethylene glycol (MIRALAX) 17 g packet, Take 17 g by mouth daily., Disp: 30 each, Rfl: 2   bisoprolol (ZEBETA) 10 MG tablet, Take 1 tablet (10 mg total) by mouth daily., Disp: 90 tablet, Rfl: 1   losartan-hydrochlorothiazide (HYZAAR) 100-12.5 MG tablet, Take 1 tablet by mouth daily., Disp: 90 tablet, Rfl: 1  Patient Active Problem List   Diagnosis Date Noted   IDA (iron deficiency anemia) 09/28/2021   Rheumatoid arthritis of multiple sites with negative rheumatoid factor (HCC) 06/09/2020   Obesity (BMI 30.0-34.9) 08/10/2018   Emphysema of lung (HCC) 08/10/2018   Allergic rhinitis 08/10/2018   Primary osteoarthritis of right knee 07/17/2018   Spondylolisthesis at L4-L5 level 03/07/2017   Abdominal aortic atherosclerosis (HCC) 03/05/2017   Benign liver cyst 03/05/2017   GERD (gastroesophageal reflux disease) 08/07/2016   Osteoporosis 05/07/2016   Prediabetes 10/15/2015   Hyperlipidemia 10/15/2015   Essential hypertension 10/15/2015  Diverticulosis of colon, acquired 10/15/2015    Past Surgical History:  Procedure Laterality Date   ABDOMINAL HYSTERECTOMY     BACK SURGERY  2010   BREAST CYST ASPIRATION Right 1990's   COLONOSCOPY Left 11/09/2017   Procedure: COLONOSCOPY;  Surgeon: Pasty Spillers, MD;  Location: [facility] ENDOSCOPY;  Service: Endoscopy;  Laterality: Left;   COLONOSCOPY WITH PROPOFOL N/A 04/14/2020   Procedure: COLONOSCOPY WITH PROPOFOL;  Surgeon: Pasty Spillers, MD;  Location: [facility] ENDOSCOPY;  Service: Endoscopy;  Laterality: N/A;   CYSTO WITH HYDRODISTENSION N/A  08/15/2017   Procedure: CYSTOSCOPY/HYDRODISTENSION;  Surgeon: Hildred Laser, MD;  Location: [facility] ORS;  Service: Urology;  Laterality: N/A;   ESOPHAGOGASTRODUODENOSCOPY Left 11/09/2017   Procedure: ESOPHAGOGASTRODUODENOSCOPY (EGD);  Surgeon: Pasty Spillers, MD;  Location: [facility] ENDOSCOPY;  Service: Endoscopy;  Laterality: Left;   ESOPHAGOGASTRODUODENOSCOPY (EGD) WITH PROPOFOL N/A 02/22/2020   Procedure: ESOPHAGOGASTRODUODENOSCOPY (EGD) WITH PROPOFOL;  Surgeon: Midge Minium, MD;  Location: [facility] ENDOSCOPY;  Service: Endoscopy;  Laterality: N/A;   FOOT SURGERY Right     Family History  Problem Relation Age of Onset   Hypertension Brother    Heart disease Brother    Heart failure Brother    Hypertension Mother    Stroke Mother    Stroke Father    Prostate cancer Father    Aneurysm Sister    Breast cancer Sister 83   Liver disease Maternal Aunt    Heart failure Paternal Aunt    Heart disease Paternal Uncle    Heart attack Brother    Heart disease Brother    Breast cancer Maternal Aunt    Heart disease Paternal Aunt    Kidney cancer Neg Hx    Bladder Cancer Neg Hx     Social History   Tobacco Use   Smoking status: Never   Smokeless tobacco: Never  Vaping Use   Vaping status: Never Used  Substance Use Topics   Alcohol use: No    Alcohol/week: 0.0 standard drinks of alcohol   Drug use: No     Allergies  Allergen Reactions   Aspirin Other (See Comments)    Diverticulosis   Lisinopril Cough    Health Maintenance  Topic Date Due   MAMMOGRAM  01/09/2024   Medicare Annual Wellness (AWV)  03/13/2024   COVID-19 Vaccine (4 - 2024-25 season) 02/08/2024 (Originally 07/27/2023)   INFLUENZA VACCINE  02/23/2024 (Originally 06/26/2023)   Zoster Vaccines- Shingrix (1 of 2) 04/21/2024 (Originally [birth year]/1966)   Colonoscopy  04/14/2025   DEXA SCAN  05/19/2025   DTaP/Tdap/Td (4 - Td or Tdap) 07/12/2032   Pneumonia Vaccine 35+ Years old  Completed   Hepatitis C Screening   Completed   HPV VACCINES  Aged Out    Chart Review Today: I personally reviewed active problem list, medication list, allergies, family history, social history, health maintenance, notes from last encounter, lab results, imaging with the patient/caregiver today.   Review of Systems  Constitutional: Negative.   HENT: Negative.    Eyes: Negative.   Respiratory: Negative.    Cardiovascular: Negative.   Gastrointestinal: Negative.   Endocrine: Negative.   Genitourinary: Negative.   Musculoskeletal: Negative.   Skin: Negative.   Allergic/Immunologic: Negative.   Neurological: Negative.   Hematological: Negative.   Psychiatric/Behavioral: Negative.    All other systems reviewed and are negative.    Objective:   Vitals:   01/23/24 0944  BP: 130/82  Pulse: 69  Resp: 16  SpO2: 97%  Weight: 198 lb (89.8  kg)  Height: 5\' 6"  (1.676 m)    Body mass index is 31.96 kg/m.  Physical Exam Vitals and nursing note reviewed.  Constitutional:      General: She is not in acute distress.    Appearance: Normal appearance. She is well-developed. She is obese. She is not ill-appearing, toxic-appearing or diaphoretic.  HENT:     Head: Normocephalic and atraumatic.     Right Ear: External ear normal.     Left Ear: External ear normal.     Nose: Nose normal.  Eyes:     General: No scleral icterus.       Right eye: No discharge.        Left eye: No discharge.     Conjunctiva/sclera: Conjunctivae normal.  Neck:     Trachea: No tracheal deviation.  Cardiovascular:     Rate and Rhythm: Normal rate and regular rhythm.     Pulses: Normal pulses.     Heart sounds: Normal heart sounds. No murmur heard.    No friction rub. No gallop.  Pulmonary:     Effort: Pulmonary effort is normal. No respiratory distress.     Breath sounds: Normal breath sounds. No stridor. No wheezing, rhonchi or rales.  Abdominal:     General: Bowel sounds are normal.     Palpations: Abdomen is soft.   Musculoskeletal:     Right lower leg: No edema.     Left lower leg: No edema.  Skin:    General: Skin is warm and dry.     Findings: No rash.  Neurological:     Mental Status: She is alert.     Motor: No abnormal muscle tone.     Coordination: Coordination normal.  Psychiatric:        Mood and Affect: Mood normal.        Behavior: Behavior normal.      Functional Status Survey:   Results for orders placed or performed in visit on 07/31/23  Novel Coronavirus, NAA ([facility])   Collection Time: 07/31/23  5:05 PM   Specimen: Nasopharyngeal(NP) swabs in vial transport medium   Nasopharynge  Resident  Result Value Ref Range   SARS-CoV-2, NAA Detected (A) Not Detected      Assessment & Plan:   Breast cancer screening by mammogram -     3D Screening Mammogram, Left and Right; Future  Essential hypertension Assessment & Plan: Blood pressure at goal today, patient is managed on losartan hydrochlorothiazide and bisoprolol BP Readings from Last 3 Encounters:  01/23/24 130/82  07/31/23 126/82  07/22/23 (!) 146/82  Med refills sent in, no dose changes  Orders: -     Bisoprolol Fumarate; Take 1 tablet (10 mg total) by mouth daily.  Dispense: 90 tablet; Refill: 1 -     Losartan Potassium-HCTZ; Take 1 tablet by mouth daily.  Dispense: 90 tablet; Refill: 1 -     BASIC METABOLIC PANEL WITH GFR  Prediabetes Assessment & Plan: Increasing A1C over the past year Recheck A1C today Not on meds and no specific diet/lifestyle efforts Lab Results  Component Value Date   HGBA1C 6.1 (H) 07/22/2023   HGBA1C 5.8 (H) 07/12/2022   HGBA1C 5.0 06/29/2021   HGBA1C 5.9 (H) 12/29/2020     Orders: -     BASIC METABOLIC PANEL WITH GFR -     Hemoglobin A1c  Rheumatoid arthritis of multiple sites with negative rheumatoid factor [facility]) Assessment & Plan: Per rheumatology, office visit and lab work done in the  last week reviewed through care everywhere   Iron deficiency anemia due to  chronic blood loss Assessment & Plan: Labs recently checked through specialist, no current anemia   Mixed hyperlipidemia Assessment & Plan: Managed on lovastatin, labs done about 6 months ago without any med changes no labs indicated today, continue lovastatin and diet lifestyle efforts  Orders: -     BASIC METABOLIC PANEL WITH GFR  Other emphysema (HCC) Assessment & Plan: No requiring inhalers, no DOE or recurrent pulm sx or infections   Abdominal aortic atherosclerosis (HCC) Assessment & Plan: Per prior imaging On lovastatin   Obesity (BMI 30.0-34.9) Assessment & Plan: Wt Readings from Last 5 Encounters:  01/23/24 198 lb (89.8 kg)  07/31/23 193 lb 6.4 oz (87.7 kg)  07/22/23 199 lb 1.6 oz (90.3 kg)  03/14/23 200 lb (90.7 kg)  01/17/23 202 lb 9.6 oz (91.9 kg)   BMI Readings from Last 5 Encounters:  01/23/24 31.96 kg/m  07/31/23 31.22 kg/m  07/22/23 32.14 kg/m  03/14/23 32.28 kg/m  01/17/23 32.70 kg/m   With associated HLD, HTN, prediabetes RA, OA   Gastroesophageal reflux disease, unspecified whether esophagitis present Assessment & Plan: Stable, well-controlled, using over-the-counter medications very rarely, largely controlled with avoiding food triggers   Age-related osteoporosis without current pathological fracture Assessment & Plan: Dexa done last year and due next year, managing with supplements and weight bearing exercises as able   Encounter for medication monitoring -     BASIC METABOLIC PANEL WITH GFR -     Hemoglobin A1c     Return for needs to reschedule MWV due april 19, 6 month f/up.   Danelle Berry, PA-C 01/23/24 10:04 AM

## 2024-01-23 NOTE — Assessment & Plan Note (Signed)
 Blood pressure at goal today, patient is managed on losartan hydrochlorothiazide and bisoprolol BP Readings from Last 3 Encounters:  01/23/24 130/82  07/31/23 126/82  07/22/23 (!) 146/82  Med refills sent in, no dose changes

## 2024-01-23 NOTE — Assessment & Plan Note (Signed)
 Managed on lovastatin, labs done about 6 months ago without any med changes no labs indicated today, continue lovastatin and diet lifestyle efforts

## 2024-01-23 NOTE — Assessment & Plan Note (Signed)
 Per prior imaging On lovastatin

## 2024-01-23 NOTE — Assessment & Plan Note (Signed)
 Wt Readings from Last 5 Encounters:  01/23/24 198 lb (89.8 kg)  07/31/23 193 lb 6.4 oz (87.7 kg)  07/22/23 199 lb 1.6 oz (90.3 kg)  03/14/23 200 lb (90.7 kg)  01/17/23 202 lb 9.6 oz (91.9 kg)   BMI Readings from Last 5 Encounters:  01/23/24 31.96 kg/m  07/31/23 31.22 kg/m  07/22/23 32.14 kg/m  03/14/23 32.28 kg/m  01/17/23 32.70 kg/m   With associated HLD, HTN, prediabetes RA, OA

## 2024-01-23 NOTE — Assessment & Plan Note (Signed)
 Labs recently checked through specialist, no current anemia

## 2024-01-23 NOTE — Assessment & Plan Note (Signed)
 Dexa done last year and due next year, managing with supplements and weight bearing exercises as able

## 2024-01-23 NOTE — Assessment & Plan Note (Signed)
 No requiring inhalers, no DOE or recurrent pulm sx or infections

## 2024-01-24 LAB — BASIC METABOLIC PANEL WITH GFR
BUN: 23 mg/dL (ref 7–25)
CO2: 28 mmol/L (ref 20–32)
Calcium: 9.5 mg/dL (ref 8.6–10.4)
Chloride: 104 mmol/L (ref 98–110)
Creat: 0.72 mg/dL (ref 0.60–1.00)
Glucose, Bld: 92 mg/dL (ref 65–99)
Potassium: 3.6 mmol/L (ref 3.5–5.3)
Sodium: 141 mmol/L (ref 135–146)
eGFR: 87 mL/min/{1.73_m2} (ref >=60)

## 2024-01-24 LAB — HEMOGLOBIN A1C
Hgb A1c MFr Bld: 6.2 %{Hb} — ABNORMAL HIGH (ref <5.7)
Mean Plasma Glucose: 131 mg/dL
eAG (mmol/L): 7.3 mmol/L

## 2024-01-28 ENCOUNTER — Encounter: Payer: Self-pay | Admitting: Family Medicine

## 2024-02-13 DIAGNOSIS — M0609 Rheumatoid arthritis without rheumatoid factor, multiple sites: Secondary | ICD-10-CM | POA: Diagnosis not present

## 2024-02-19 ENCOUNTER — Other Ambulatory Visit: Payer: Self-pay | Admitting: Family Medicine

## 2024-02-19 DIAGNOSIS — I700 Atherosclerosis of aorta: Secondary | ICD-10-CM

## 2024-02-19 DIAGNOSIS — E782 Mixed hyperlipidemia: Secondary | ICD-10-CM

## 2024-03-03 ENCOUNTER — Ambulatory Visit
Admission: RE | Admit: 2024-03-03 | Discharge: 2024-03-03 | Disposition: A | Discharge status: Home / Self Care | Source: Ambulatory Visit | Attending: Family Medicine | Admitting: Family Medicine

## 2024-03-03 DIAGNOSIS — Z1231 Encounter for screening mammogram for malignant neoplasm of breast: Secondary | ICD-10-CM | POA: Diagnosis not present

## 2024-03-16 DIAGNOSIS — M0609 Rheumatoid arthritis without rheumatoid factor, multiple sites: Secondary | ICD-10-CM | POA: Diagnosis not present

## 2024-04-13 DIAGNOSIS — M0609 Rheumatoid arthritis without rheumatoid factor, multiple sites: Secondary | ICD-10-CM | POA: Diagnosis not present

## 2024-05-11 DIAGNOSIS — M0609 Rheumatoid arthritis without rheumatoid factor, multiple sites: Secondary | ICD-10-CM | POA: Diagnosis not present

## 2024-05-21 DIAGNOSIS — M170 Bilateral primary osteoarthritis of knee: Secondary | ICD-10-CM | POA: Diagnosis not present

## 2024-05-21 DIAGNOSIS — I10 Essential (primary) hypertension: Secondary | ICD-10-CM | POA: Diagnosis not present

## 2024-05-21 DIAGNOSIS — M0609 Rheumatoid arthritis without rheumatoid factor, multiple sites: Secondary | ICD-10-CM | POA: Diagnosis not present

## 2024-05-21 DIAGNOSIS — Z7960 Long term (current) use of unspecified immunomodulators and immunosuppressants: Secondary | ICD-10-CM | POA: Diagnosis not present

## 2024-06-08 DIAGNOSIS — M0609 Rheumatoid arthritis without rheumatoid factor, multiple sites: Secondary | ICD-10-CM | POA: Diagnosis not present

## 2024-06-15 ENCOUNTER — Other Ambulatory Visit: Payer: Self-pay | Admitting: Family Medicine

## 2024-06-15 DIAGNOSIS — I10 Essential (primary) hypertension: Secondary | ICD-10-CM

## 2024-06-17 NOTE — Telephone Encounter (Signed)
 Requested Prescriptions  Pending Prescriptions Disp Refills   losartan -hydrochlorothiazide  (HYZAAR) 100-12.5 MG tablet [Pharmacy Med Name: Losartan  Potassium-HCTZ 100-12.5 MG Oral Tablet] 90 tablet 0    Sig: Take 1 tablet by mouth once daily     Cardiovascular: ARB + Diuretic Combos Passed - 06/17/2024  1:47 PM      Passed - K in normal range and within 180 days    Potassium  Date Value Ref Range Status  01/23/2024 3.6 3.5 - 5.3 mmol/L Final  01/16/2014 3.5 3.5 - 5.1 mmol/L Final         Passed - Na in normal range and within 180 days    Sodium  Date Value Ref Range Status  01/23/2024 141 135 - 146 mmol/L Final  10/15/2019 141 134 - 144 mmol/L Final  01/16/2014 141 136 - 145 mmol/L Final         Passed - Cr in normal range and within 180 days    Creat  Date Value Ref Range Status  01/23/2024 0.72 0.60 - 1.00 mg/dL Final         Passed - eGFR is 10 or above and within 180 days    GFR, Est African American  Date Value Ref Range Status  12/29/2020 95 > OR = 60 mL/min/1.66m2 Final   GFR, Est Non African American  Date Value Ref Range Status  12/29/2020 82 > OR = 60 mL/min/1.62m2 Final   GFR, Estimated  Date Value Ref Range Status  10/05/2021 >60 >60 mL/min Final    Comment:    (NOTE) Calculated using the CKD-EPI Creatinine Equation (2021)    eGFR  Date Value Ref Range Status  01/23/2024 87 > OR = 60 mL/min/1.28m2 Final         Passed - Patient is not pregnant      Passed - Last BP in normal range    BP Readings from Last 1 Encounters:  01/23/24 130/82         Passed - Valid encounter within last 6 months    Recent Outpatient Visits           4 months ago Breast cancer screening by mammogram   [facility] Leavy Mole, PA-C       Future Appointments             In 1 month Leavy Mole, PA-C [facility], [facility]

## 2024-07-06 DIAGNOSIS — M0609 Rheumatoid arthritis without rheumatoid factor, multiple sites: Secondary | ICD-10-CM | POA: Diagnosis not present

## 2024-07-16 ENCOUNTER — Other Ambulatory Visit: Payer: Self-pay | Admitting: Family Medicine

## 2024-07-16 DIAGNOSIS — I10 Essential (primary) hypertension: Secondary | ICD-10-CM

## 2024-07-16 NOTE — Telephone Encounter (Signed)
 Requested Prescriptions  Pending Prescriptions Disp Refills   bisoprolol  (ZEBETA ) 10 MG tablet [Pharmacy Med Name: Bisoprolol  Fumarate 10 MG Oral Tablet] 90 tablet 0    Sig: Take 1 tablet by mouth once daily     Cardiovascular: Beta Blockers 2 Passed - 07/16/2024  5:03 PM      Passed - Cr in normal range and within 360 days    Creat  Date Value Ref Range Status  01/23/2024 0.72 0.60 - 1.00 mg/dL Final         Passed - Last BP in normal range    BP Readings from Last 1 Encounters:  01/23/24 130/82         Passed - Last Heart Rate in normal range    Pulse Readings from Last 1 Encounters:  01/23/24 69         Passed - Valid encounter within last 6 months    Recent Outpatient Visits           5 months ago Breast cancer screening by mammogram   [facility] Leavy Mole, PA-C       Future Appointments             In 1 week Leavy Mole, PA-C [facility], [facility]

## 2024-07-19 ENCOUNTER — Other Ambulatory Visit: Payer: Self-pay | Admitting: Family Medicine

## 2024-07-19 DIAGNOSIS — E782 Mixed hyperlipidemia: Secondary | ICD-10-CM

## 2024-07-19 DIAGNOSIS — I700 Atherosclerosis of aorta: Secondary | ICD-10-CM

## 2024-07-19 DIAGNOSIS — I10 Essential (primary) hypertension: Secondary | ICD-10-CM

## 2024-07-19 NOTE — Telephone Encounter (Unsigned)
 Copied from CRM [id number]. Topic: Clinical - Medication Refill >> Jul 19, 2024 11:48 AM Jasmin G wrote: Medication: losartan -hydrochlorothiazide  (HYZAAR) 100-12.5 MG tablet lovastatin  (MEVACOR ) 40 MG tablet bisoprolol  (ZEBETA ) 10 MG tablet  Has the patient contacted their pharmacy? No (Agent: If no, request that the patient contact the pharmacy for the refill. If patient does not wish to contact the pharmacy document the reason why and proceed with request.) (Agent: If yes, when and what did the pharmacy advise?)  This is the patient's preferred pharmacy:  [facility] [address] ([address]), [place] - [address] [place]) [place] [address] Phone: [phone] Fax: [phone]  Is this the correct pharmacy for this prescription? Yes If no, delete pharmacy and type the correct one.   Has the prescription been filled recently? Yes  Is the patient out of the medication? No  Has the patient been seen for an appointment in the last year OR does the patient have an upcoming appointment? Yes  Can we respond through MyChart? No  Agent: Please be advised that Rx refills may take up to 3 business days. We ask that you follow-up with your pharmacy.

## 2024-07-20 MED ORDER — LOVASTATIN 40 MG PO TABS: 40.0000 mg | ORAL_TABLET | Freq: Every day | ORAL | 0 refills | Status: DC

## 2024-07-20 NOTE — Telephone Encounter (Signed)
 Requested Prescriptions  Pending Prescriptions Disp Refills   lovastatin  (MEVACOR ) 40 MG tablet 90 tablet 0    Sig: Take 1 tablet (40 mg total) by mouth at bedtime.     Cardiovascular:  Antilipid - Statins 2 Failed - 07/20/2024  3:36 PM      Failed - Lipid Panel in normal range within the last 12 months    Cholesterol, Total  Date Value Ref Range Status  10/15/2019 156 100 - 199 mg/dL Final   Cholesterol  Date Value Ref Range Status  07/22/2023 139 <200 mg/dL Final   LDL Cholesterol (Calc)  Date Value Ref Range Status  07/22/2023 59 mg/dL (calc) Final    Comment:    Reference range: <100 . Desirable range <100 mg/dL for primary prevention;   <70 mg/dL for patients with CHD or diabetic patients  with > or = 2 CHD risk factors. SABRA LDL-C is now calculated using the Martin-Hopkins  calculation, which is a validated novel method providing  better accuracy than the Friedewald equation in the  estimation of LDL-C.  Gladis APPLETHWAITE et al. SANDREA. 7986;689(80): 2061-2068  (http://education.[employer].com/faq/FAQ164)    HDL  Date Value Ref Range Status  07/22/2023 65 > OR = 50 mg/dL Final  88/79/7979 79 >60 mg/dL Final   Triglycerides  Date Value Ref Range Status  07/22/2023 72 <150 mg/dL Final         Passed - Cr in normal range and within 360 days    Creat  Date Value Ref Range Status  01/23/2024 0.72 0.60 - 1.00 mg/dL Final         Passed - Patient is not pregnant      Passed - Valid encounter within last 12 months    Recent Outpatient Visits           5 months ago Breast cancer screening by mammogram   [facility] Leavy Mole, PA-C       Future Appointments             In 3 days Leavy Mole, PA-C [facility]             bisoprolol  (ZEBETA ) 10 MG tablet 90 tablet 0    Sig: Take 1 tablet (10 mg total) by mouth daily.     Cardiovascular: Beta Blockers 2 Passed - 07/20/2024  3:36 PM      Passed  - Cr in normal range and within 360 days    Creat  Date Value Ref Range Status  01/23/2024 0.72 0.60 - 1.00 mg/dL Final         Passed - Last BP in normal range    BP Readings from Last 1 Encounters:  01/23/24 130/82         Passed - Last Heart Rate in normal range    Pulse Readings from Last 1 Encounters:  01/23/24 69         Passed - Valid encounter within last 6 months    Recent Outpatient Visits           5 months ago Breast cancer screening by mammogram   [facility] Leavy Mole, PA-C       Future Appointments             In 3 days Leavy Mole, PA-C [facility]             losartan -hydrochlorothiazide  (HYZAAR) 100-12.5 MG tablet 90 tablet 0  Sig: Take 1 tablet by mouth daily.     Cardiovascular: ARB + Diuretic Combos Passed - 07/20/2024  3:36 PM      Passed - K in normal range and within 180 days    Potassium  Date Value Ref Range Status  01/23/2024 3.6 3.5 - 5.3 mmol/L Final  01/16/2014 3.5 3.5 - 5.1 mmol/L Final         Passed - Na in normal range and within 180 days    Sodium  Date Value Ref Range Status  01/23/2024 141 135 - 146 mmol/L Final  10/15/2019 141 134 - 144 mmol/L Final  01/16/2014 141 136 - 145 mmol/L Final         Passed - Cr in normal range and within 180 days    Creat  Date Value Ref Range Status  01/23/2024 0.72 0.60 - 1.00 mg/dL Final         Passed - eGFR is 10 or above and within 180 days    GFR, Est African American  Date Value Ref Range Status  12/29/2020 95 > OR = 60 mL/min/1.29m2 Final   GFR, Est Non African American  Date Value Ref Range Status  12/29/2020 82 > OR = 60 mL/min/1.75m2 Final   GFR, Estimated  Date Value Ref Range Status  10/05/2021 >60 >60 mL/min Final    Comment:    (NOTE) Calculated using the CKD-EPI Creatinine Equation (2021)    eGFR  Date Value Ref Range Status  01/23/2024 87 > OR = 60 mL/min/1.59m2 Final         Passed -  Patient is not pregnant      Passed - Last BP in normal range    BP Readings from Last 1 Encounters:  01/23/24 130/82         Passed - Valid encounter within last 6 months    Recent Outpatient Visits           5 months ago Breast cancer screening by mammogram   [facility] Leavy Mole, PA-C       Future Appointments             In 3 days Leavy Mole, PA-C [facility], [facility]

## 2024-07-23 ENCOUNTER — Encounter: Payer: Self-pay | Admitting: Family Medicine

## 2024-07-23 ENCOUNTER — Ambulatory Visit (INDEPENDENT_AMBULATORY_CARE_PROVIDER_SITE_OTHER): Payer: Medicare Other | Admitting: Family Medicine

## 2024-07-23 VITALS — BP 138/82 | HR 67 | Resp 16 | Ht 66.0 in | Wt 199.0 lb

## 2024-07-23 DIAGNOSIS — E782 Mixed hyperlipidemia: Secondary | ICD-10-CM | POA: Diagnosis not present

## 2024-07-23 DIAGNOSIS — I10 Essential (primary) hypertension: Secondary | ICD-10-CM | POA: Diagnosis not present

## 2024-07-23 DIAGNOSIS — R7303 Prediabetes: Secondary | ICD-10-CM | POA: Diagnosis not present

## 2024-07-23 DIAGNOSIS — I700 Atherosclerosis of aorta: Secondary | ICD-10-CM

## 2024-07-23 DIAGNOSIS — M0609 Rheumatoid arthritis without rheumatoid factor, multiple sites: Secondary | ICD-10-CM

## 2024-07-23 DIAGNOSIS — J438 Other emphysema: Secondary | ICD-10-CM | POA: Diagnosis not present

## 2024-07-23 NOTE — Assessment & Plan Note (Signed)
On statin, monitoring 

## 2024-07-23 NOTE — Assessment & Plan Note (Addendum)
 Lovastatin   Lab Results  Component Value Date   CHOL 139 07/22/2023   HDL 65 07/22/2023   LDLCALC 59 07/22/2023   TRIG 72 07/22/2023   CHOLHDL 2.1 07/22/2023  Annual cholesterol labs are due as the last test was conducted in August of the previous year. She is on lovastatin  for lipid management. - Order cholesterol panel.

## 2024-07-23 NOTE — Assessment & Plan Note (Addendum)
 Per rheumatology on newer meds/injections Holly Green Persistent pain and stiffness, particularly in the hands. Infusions are not significantly alleviating pain. Swelling in the fingers has decreased with injections, but stiffness persists, especially in the morning. Improvement in symptoms after hot showers. Consideration of referral to a physiatry team for complementary management to improve function and decrease pain. - Discuss with rheumatologist the potential referral to a physiatry team for complementary management of rheumatoid arthritis symptoms. I reviewed labs done at specialists through care everywhere monitoring CBC, renal function and LFTs

## 2024-07-23 NOTE — Assessment & Plan Note (Signed)
 The last A1c check was approximately six months ago. - Order A1c test to monitor prediabetes status.   Lab Results  Component Value Date   HGBA1C 6.2 (H) 01/23/2024   HGBA1C 6.1 (H) 07/22/2023   HGBA1C 5.8 (H) 07/12/2022   HGBA1C 5.0 06/29/2021   HGBA1C 5.9 (H) 12/29/2020

## 2024-07-23 NOTE — Assessment & Plan Note (Signed)
 Currently well controlled and stable on current meds - home readings at goal, BP slightly above goal today but acceptable Continue bisoprolol  losartan -HCTZ BP Readings from Last 3 Encounters:  07/23/24 138/82  01/23/24 130/82  07/31/23 126/82  Blood pressure readings at home are generally within acceptable range, with occasional higher readings. Today's office reading is slightly elevated but not concerning. - Continue current antihypertensive medications. - Monitor blood pressure at home and report if average readings exceed 140/90 mmHg.

## 2024-07-23 NOTE — Assessment & Plan Note (Signed)
 Sx stable/unchanged No requiring inhalers, no DOE or recurrent pulm sx or infections

## 2024-07-23 NOTE — Progress Notes (Signed)
 Name: Holly Green   MRN: [id number]    DOB: March 02, 1946   Date:07/23/2024       Progress Note  Chief Complaint  Patient presents with   Medical Management of Chronic Issues    6 month follow-up   Prediabetes   Hyperlipidemia   Hypertension     Subjective:   Holly Green is a 78 y.o. female, presents to clinic for routine follow up on chronic conditions  HTN at home BP is around 130/70-80, sometimes lower 116/80 No med concerns or SE Discussed the use of AI scribe software for clinical note transcription with the patient, who gave verbal consent to proceed.  History of Present Illness Holly Green is a 78 year old female with an autoimmune condition who presents for follow-up regarding pain management and medication refills.  Chronic joint pain and stiffness - Ongoing pain management issues despite receiving infusions, with minimal pain relief - Stiffness and swelling in the hands, particularly in the mornings - Difficulty closing hands in the morning, improved after a hot shower - Previous finger swelling reduced after injections, but stiffness persists - No constant pain  Hypertension - Currently taking bisoprolol  losartan  and HCTZ - Monitors blood pressure at home - Typical blood pressure readings around 131/79 mmHg - Occasional lower readings, such as 116/82 mmHg - Blood pressure varies with activity level and timing of measurement - Recent medication refill after running low  Hyperlipidemia - Currently taking lovastatin  - Cholesterol levels monitored annually - Last cholesterol check in August of the previous year - Due for recheck of cholesterol levels  Prediabetes - Stable A1c over the past six months - Due for recheck of A1c     Current Outpatient Medications:    acetaminophen  (TYLENOL ) 650 MG CR tablet, Take 650-1,300 mg by mouth every 8 (eight) hours as needed for pain., Disp: , Rfl:    bisoprolol  (ZEBETA ) 10 MG tablet, Take 1 tablet by mouth  once daily, Disp: 90 tablet, Rfl: 0   Calcium  Carbonate (CALCIUM  600 PO), Take 1,200 mg by mouth daily., Disp: , Rfl:    Certolizumab Pegol  2 X 200 MG/ML PSKT, Inject 400 mg into the skin every 28 (twenty-eight) days., Disp: , Rfl:    Cholecalciferol  (VITAMIN D ) 50 MCG (2000 UT) CAPS, Take by mouth., Disp: , Rfl:    clotrimazole  (CLOTRIMAZOLE  AF) 1 % cream, Apply 1 application. topically 2 (two) times daily., Disp: 30 g, Rfl: 0   ketoconazole  (NIZORAL ) 2 % cream, For fungal infection - apply to feet and toenails QHS., Disp: 60 g, Rfl: 11   loratadine  (CLARITIN ) 10 MG tablet, Take 1 tablet (10 mg total) by mouth daily., Disp: 30 tablet, Rfl: 11   losartan -hydrochlorothiazide  (HYZAAR) 100-12.5 MG tablet, Take 1 tablet by mouth once daily, Disp: 90 tablet, Rfl: 0   lovastatin  (MEVACOR ) 40 MG tablet, Take 1 tablet (40 mg total) by mouth at bedtime., Disp: 90 tablet, Rfl: 0   methotrexate  2.5 MG tablet, Take 10 mg by mouth once a week. Pt takes every sunday, Disp: , Rfl:    mometasone  (ELOCON ) 0.1 % cream, Apply to affected skin twice a day prn up to 5d/wk until clear. Then PRN thereafter., Disp: 45 g, Rfl: 2   polyethylene glycol (MIRALAX ) 17 g packet, Take 17 g by mouth daily., Disp: 30 each, Rfl: 2  Patient Active Problem List   Diagnosis Date Noted   IDA (iron deficiency anemia) 09/28/2021   Rheumatoid arthritis of multiple sites with  negative rheumatoid factor (HCC) 06/09/2020   Obesity (BMI 30.0-34.9) 08/10/2018   Emphysema of lung (HCC) 08/10/2018   Allergic rhinitis 08/10/2018   Primary osteoarthritis of right knee 07/17/2018   Spondylolisthesis at L4-L5 level 03/07/2017   Abdominal aortic atherosclerosis (HCC) 03/05/2017   Benign liver cyst 03/05/2017   GERD (gastroesophageal reflux disease) 08/07/2016   Osteoporosis 05/07/2016   Prediabetes 10/15/2015   Hyperlipidemia 10/15/2015   Essential hypertension 10/15/2015   Diverticulosis of colon, acquired 10/15/2015    Past Surgical  History:  Procedure Laterality Date   ABDOMINAL HYSTERECTOMY     BACK SURGERY  2010   BREAST CYST ASPIRATION Right 1990's   COLONOSCOPY Left 11/09/2017   Procedure: COLONOSCOPY;  Surgeon: Janalyn Keene NOVAK, MD;  Location: [facility] ENDOSCOPY;  Service: Endoscopy;  Laterality: Left;   COLONOSCOPY WITH PROPOFOL  N/A 04/14/2020   Procedure: COLONOSCOPY WITH PROPOFOL ;  Surgeon: Janalyn Keene NOVAK, MD;  Location: [facility] ENDOSCOPY;  Service: Endoscopy;  Laterality: N/A;   CYSTO WITH HYDRODISTENSION N/A 08/15/2017   Procedure: CYSTOSCOPY/HYDRODISTENSION;  Surgeon: Chauncey Redell Agent, MD;  Location: [facility] ORS;  Service: Urology;  Laterality: N/A;   ESOPHAGOGASTRODUODENOSCOPY Left 11/09/2017   Procedure: ESOPHAGOGASTRODUODENOSCOPY (EGD);  Surgeon: Janalyn Keene NOVAK, MD;  Location: [facility]COPY;  Service: Endoscopy;  Laterality: Left;   ESOPHAGOGASTRODUODENOSCOPY (EGD) WITH PROPOFOL  N/A 02/22/2020   Procedure: ESOPHAGOGASTRODUODENOSCOPY (EGD) WITH PROPOFOL ;  Surgeon: Jinny Carmine, MD;  Location: [facility] ENDOSCOPY;  Service: Endoscopy;  Laterality: N/A;   FOOT SURGERY Right     Family History  Problem Relation Age of Onset   Hypertension Brother    Heart disease Brother    Heart failure Brother    Hypertension Mother    Stroke Mother    Stroke Father    Prostate cancer Father    Aneurysm Sister    Breast cancer Sister 39   Liver disease Maternal Aunt    Heart failure Paternal Aunt    Heart disease Paternal Uncle    Heart attack Brother    Heart disease Brother    Breast cancer Maternal Aunt    Heart disease Paternal Aunt    Kidney cancer Neg Hx    Bladder Cancer Neg Hx     Social History   Tobacco Use   Smoking status: Never   Smokeless tobacco: Never  Vaping Use   Vaping status: Never Used  Substance Use Topics   Alcohol use: No    Alcohol/week: 0.0 standard drinks of alcohol   Drug use: No     Allergies  Allergen Reactions   Aspirin Other (See Comments)    Diverticulosis    Lisinopril  Cough    Health Maintenance  Topic Date Due   Medicare Annual Wellness (AWV)  03/13/2024   COVID-19 Vaccine (4 - 2024-25 season) 08/07/2024 (Originally 07/27/2023)   Zoster Vaccines- Shingrix (1 of 2) 10/22/2024 (Originally 08/17/1966)   INFLUENZA VACCINE  02/22/2025 (Originally 06/25/2024)   MAMMOGRAM  03/03/2025   Colonoscopy  04/14/2025   DEXA SCAN  05/19/2025   DTaP/Tdap/Td (4 - Td or Tdap) 07/12/2032   Pneumococcal Vaccine: 50+ Years  Completed   Hepatitis C Screening  Completed   HPV VACCINES  Aged Out   Meningococcal B Vaccine  Aged Out   Hepatitis B Vaccines 19-59 Average Risk  Discontinued    Chart Review Today: I personally reviewed active problem list, medication list, allergies, family history, social history, health maintenance, notes from last encounter, lab results, imaging with the patient/caregiver today.   Review of Systems  Constitutional: Negative.   HENT: Negative.    Eyes: Negative.   Respiratory: Negative.    Cardiovascular: Negative.   Gastrointestinal: Negative.   Endocrine: Negative.   Genitourinary: Negative.   Musculoskeletal: Negative.   Skin: Negative.   Allergic/Immunologic: Negative.   Neurological: Negative.   Hematological: Negative.   Psychiatric/Behavioral: Negative.    All other systems reviewed and are negative.    Objective:   Vitals:   07/23/24 0920  BP: 138/82  Pulse: 67  Resp: 16  SpO2: 98%  Weight: 199 lb (90.3 kg)  Height: 5' 6 (1.676 m)    Body mass index is 32.12 kg/m.  Physical Exam Vitals and nursing note reviewed.  Constitutional:      General: She is not in acute distress.    Appearance: Normal appearance. She is well-developed. She is not ill-appearing, toxic-appearing or diaphoretic.  HENT:     Head: Normocephalic and atraumatic.     Right Ear: External ear normal.     Left Ear: External ear normal.     Nose: Nose normal.  Eyes:     General: No scleral icterus.       Right eye: No  discharge.        Left eye: No discharge.     Conjunctiva/sclera: Conjunctivae normal.  Neck:     Trachea: No tracheal deviation.  Cardiovascular:     Rate and Rhythm: Normal rate and regular rhythm.     Pulses: Normal pulses.     Heart sounds: Normal heart sounds.  Pulmonary:     Effort: Pulmonary effort is normal. No respiratory distress.     Breath sounds: Normal breath sounds. No stridor. No wheezing, rhonchi or rales.  Musculoskeletal:     Right lower leg: No edema.     Left lower leg: No edema.  Skin:    General: Skin is warm and dry.     Findings: No rash.  Neurological:     Mental Status: She is alert.     Motor: No abnormal muscle tone.     Coordination: Coordination normal.     Gait: Gait normal.  Psychiatric:        Mood and Affect: Mood normal.        Behavior: Behavior normal.        Results for orders placed or performed in visit on 01/23/24  BASIC METABOLIC PANEL WITH GFR   Collection Time: 01/23/24 10:16 AM  Result Value Ref Range   Glucose, Bld 92 65 - 99 mg/dL   BUN 23 7 - 25 mg/dL   Creat 9.27 9.39 - 8.99 mg/dL   eGFR 87 > OR = 60 fO/fpw/8.26f7   BUN/Creatinine Ratio SEE NOTE: 6 - 22 (calc)   Sodium 141 135 - 146 mmol/L   Potassium 3.6 3.5 - 5.3 mmol/L   Chloride 104 98 - 110 mmol/L   CO2 28 20 - 32 mmol/L   Calcium  9.5 8.6 - 10.4 mg/dL  Hemoglobin J8r   Collection Time: 01/23/24 10:16 AM  Result Value Ref Range   Hgb A1c MFr Bld 6.2 (H) <5.7 % of total Hgb   Mean Plasma Glucose 131 mg/dL   eAG (mmol/L) 7.3 mmol/L      Assessment & Plan:    Assessment & Plan  Prediabetes Assessment & Plan: The last A1c check was approximately six months ago. - Order A1c test to monitor prediabetes status.   Lab Results  Component Value Date   HGBA1C 6.2 (H) 01/23/2024  HGBA1C 6.1 (H) 07/22/2023   HGBA1C 5.8 (H) 07/12/2022   HGBA1C 5.0 06/29/2021   HGBA1C 5.9 (H) 12/29/2020     Orders: -     Hemoglobin A1c -     Comprehensive metabolic  panel with GFR  Mixed hyperlipidemia Assessment & Plan: Lovastatin   Lab Results  Component Value Date   CHOL 139 07/22/2023   HDL 65 07/22/2023   LDLCALC 59 07/22/2023   TRIG 72 07/22/2023   CHOLHDL 2.1 07/22/2023  Annual cholesterol labs are due as the last test was conducted in August of the previous year. She is on lovastatin  for lipid management. - Order cholesterol panel.   Orders: -     Comprehensive metabolic panel with GFR -     Lipid panel  Abdominal aortic atherosclerosis (HCC) Assessment & Plan: On statin, monitoring   Essential hypertension Assessment & Plan: Currently well controlled and stable on current meds - home readings at goal, BP slightly above goal today but acceptable Continue bisoprolol  losartan -HCTZ BP Readings from Last 3 Encounters:  07/23/24 138/82  01/23/24 130/82  07/31/23 126/82  Blood pressure readings at home are generally within acceptable range, with occasional higher readings. Today's office reading is slightly elevated but not concerning. - Continue current antihypertensive medications. - Monitor blood pressure at home and report if average readings exceed 140/90 mmHg.   Orders: -     Comprehensive metabolic panel with GFR  Rheumatoid arthritis of multiple sites with negative rheumatoid factor [facility]) Assessment & Plan: Per rheumatology on newer meds/injections Tobie Glenn Persistent pain and stiffness, particularly in the hands. Infusions are not significantly alleviating pain. Swelling in the fingers has decreased with injections, but stiffness persists, especially in the morning. Improvement in symptoms after hot showers. Consideration of referral to a physiatry team for complementary management to improve function and decrease pain. - Discuss with rheumatologist the potential referral to a physiatry team for complementary management of rheumatoid arthritis symptoms. I reviewed labs done at specialists through care everywhere  monitoring CBC, renal function and LFTs   Other emphysema (HCC) Assessment & Plan: Sx stable/unchanged No requiring inhalers, no DOE or recurrent pulm sx or infections      General Health Maintenance Discussion about the importance of Medicare wellness visits for comprehensive health screening, including vaccinations, cancer screenings, and assessments for mobility, falls, incontinence, and memory issues. She is due for a Medicare wellness visit, which can be conducted via telephone. - Schedule Medicare wellness visit via telephone with the nurse specialist.  Follow-Up Routine follow-up to monitor chronic conditions and medication management. - Schedule follow-up visit in six months to reassess blood pressure, cholesterol, and glucose levels.  Recording duration: 11 minutes     Return in about 6 months (around 01/22/2025) for Routine follow-up.   Michelene Cower, PA-C 07/23/24 9:34 AM

## 2024-07-24 LAB — COMPREHENSIVE METABOLIC PANEL WITH GFR
AG Ratio: 1.5 (calc) (ref 1.0–2.5)
ALT: 17 U/L (ref 6–29)
AST: 24 U/L (ref 10–35)
Albumin: 4.3 g/dL (ref 3.6–5.1)
Alkaline phosphatase (APISO): 85 U/L (ref 37–153)
BUN: 20 mg/dL (ref 7–25)
CO2: 29 mmol/L (ref 20–32)
Calcium: 9.7 mg/dL (ref 8.6–10.4)
Chloride: 101 mmol/L (ref 98–110)
Creat: 0.63 mg/dL (ref 0.60–1.00)
Globulin: 2.9 g/dL (ref 1.9–3.7)
Glucose, Bld: 87 mg/dL (ref 65–99)
Potassium: 3.9 mmol/L (ref 3.5–5.3)
Sodium: 139 mmol/L (ref 135–146)
Total Bilirubin: 1.6 mg/dL — ABNORMAL HIGH (ref 0.2–1.2)
Total Protein: 7.2 g/dL (ref 6.1–8.1)
eGFR: 92 mL/min/1.73m2 (ref >=60)

## 2024-07-24 LAB — LIPID PANEL
Cholesterol: 164 mg/dL (ref <200)
HDL: 84 mg/dL (ref >=50)
LDL Cholesterol (Calc): 68 mg/dL
Non-HDL Cholesterol (Calc): 80 mg/dL (ref <130)
Total CHOL/HDL Ratio: 2 (calc) (ref <5.0)
Triglycerides: 41 mg/dL (ref <150)

## 2024-07-24 LAB — HEMOGLOBIN A1C
Hgb A1c MFr Bld: 6.2 % — ABNORMAL HIGH (ref <5.7)
Mean Plasma Glucose: 131 mg/dL
eAG (mmol/L): 7.3 mmol/L

## 2024-07-24 LAB — EXTRA

## 2024-08-02 ENCOUNTER — Ambulatory Visit: Payer: Self-pay | Admitting: Family Medicine

## 2024-08-02 DIAGNOSIS — I10 Essential (primary) hypertension: Secondary | ICD-10-CM

## 2024-08-02 DIAGNOSIS — E782 Mixed hyperlipidemia: Secondary | ICD-10-CM

## 2024-08-02 DIAGNOSIS — I700 Atherosclerosis of aorta: Secondary | ICD-10-CM

## 2024-08-02 MED ORDER — LOVASTATIN 40 MG PO TABS: 40.0000 mg | ORAL_TABLET | Freq: Every day | ORAL | 3 refills | Status: AC

## 2024-08-02 MED ORDER — LOSARTAN POTASSIUM-HCTZ 100-12.5 MG PO TABS: 1.0000 | ORAL_TABLET | Freq: Every day | ORAL | 1 refills | Status: AC

## 2024-08-02 MED ORDER — BISOPROLOL FUMARATE 10 MG PO TABS: 10.0000 mg | ORAL_TABLET | Freq: Every day | ORAL | 3 refills | Status: AC

## 2024-08-03 DIAGNOSIS — M0609 Rheumatoid arthritis without rheumatoid factor, multiple sites: Secondary | ICD-10-CM | POA: Diagnosis not present

## 2024-08-31 DIAGNOSIS — M0609 Rheumatoid arthritis without rheumatoid factor, multiple sites: Secondary | ICD-10-CM | POA: Diagnosis not present

## 2024-09-23 ENCOUNTER — Ambulatory Visit: Payer: Medicare Other

## 2024-09-24 DIAGNOSIS — M170 Bilateral primary osteoarthritis of knee: Secondary | ICD-10-CM | POA: Diagnosis not present

## 2024-09-24 DIAGNOSIS — Z7960 Long term (current) use of unspecified immunomodulators and immunosuppressants: Secondary | ICD-10-CM | POA: Diagnosis not present

## 2024-09-24 DIAGNOSIS — M0609 Rheumatoid arthritis without rheumatoid factor, multiple sites: Secondary | ICD-10-CM | POA: Diagnosis not present

## 2024-09-24 DIAGNOSIS — M7551 Bursitis of right shoulder: Secondary | ICD-10-CM | POA: Diagnosis not present

## 2024-09-24 DIAGNOSIS — M65342 Trigger finger, left ring finger: Secondary | ICD-10-CM | POA: Diagnosis not present

## 2024-09-28 DIAGNOSIS — M0609 Rheumatoid arthritis without rheumatoid factor, multiple sites: Secondary | ICD-10-CM | POA: Diagnosis not present

## 2024-09-30 DIAGNOSIS — M7551 Bursitis of right shoulder: Secondary | ICD-10-CM | POA: Diagnosis not present

## 2024-10-26 DIAGNOSIS — M0609 Rheumatoid arthritis without rheumatoid factor, multiple sites: Secondary | ICD-10-CM | POA: Diagnosis not present

## 2025-01-24 ENCOUNTER — Ambulatory Visit: Admitting: Family Medicine
